# Patient Record
Sex: Female | Born: 1935 | Race: White | Hispanic: No | Smoking: Never smoker
Health system: Southern US, Community
[De-identification: ages and names within clinical notes are randomized; demographics above are authoritative.]

## PROBLEM LIST (undated history)

## (undated) DIAGNOSIS — E7800 Pure hypercholesterolemia, unspecified: Secondary | ICD-10-CM

## (undated) DIAGNOSIS — I10 Essential (primary) hypertension: Secondary | ICD-10-CM

## (undated) DIAGNOSIS — R000 Tachycardia, unspecified: Secondary | ICD-10-CM

## (undated) DIAGNOSIS — I639 Cerebral infarction, unspecified: Secondary | ICD-10-CM

## (undated) HISTORY — PX: ABDOMINAL HYSTERECTOMY: SHX81

---

## 1998-07-19 ENCOUNTER — Ambulatory Visit (HOSPITAL_COMMUNITY): Admission: RE | Admit: 1998-07-19 | Discharge: 1998-07-19 | Payer: Self-pay | Admitting: Family Medicine

## 1999-01-24 ENCOUNTER — Emergency Department (HOSPITAL_COMMUNITY): Admission: EM | Admit: 1999-01-24 | Discharge: 1999-01-24 | Payer: Self-pay | Admitting: Emergency Medicine

## 2000-04-30 ENCOUNTER — Other Ambulatory Visit: Admission: RE | Admit: 2000-04-30 | Discharge: 2000-04-30 | Payer: Self-pay | Admitting: Family Medicine

## 2000-07-23 ENCOUNTER — Encounter: Payer: Self-pay | Admitting: Family Medicine

## 2000-07-23 ENCOUNTER — Encounter: Admission: RE | Admit: 2000-07-23 | Discharge: 2000-07-23 | Payer: Self-pay | Admitting: Family Medicine

## 2000-08-05 ENCOUNTER — Encounter: Payer: Self-pay | Admitting: Family Medicine

## 2000-08-05 ENCOUNTER — Encounter: Admission: RE | Admit: 2000-08-05 | Discharge: 2000-08-05 | Payer: Self-pay | Admitting: Family Medicine

## 2001-07-27 ENCOUNTER — Encounter: Admission: RE | Admit: 2001-07-27 | Discharge: 2001-07-27 | Payer: Self-pay | Admitting: Family Medicine

## 2001-07-27 ENCOUNTER — Encounter: Payer: Self-pay | Admitting: Family Medicine

## 2002-07-26 ENCOUNTER — Encounter: Admission: RE | Admit: 2002-07-26 | Discharge: 2002-07-26 | Payer: Self-pay | Admitting: Family Medicine

## 2002-07-26 ENCOUNTER — Encounter: Payer: Self-pay | Admitting: Family Medicine

## 2002-08-01 ENCOUNTER — Encounter (INDEPENDENT_AMBULATORY_CARE_PROVIDER_SITE_OTHER): Payer: Self-pay | Admitting: Cardiology

## 2002-08-01 ENCOUNTER — Encounter: Payer: Self-pay | Admitting: Emergency Medicine

## 2002-08-01 ENCOUNTER — Encounter: Payer: Self-pay | Admitting: Internal Medicine

## 2002-08-01 ENCOUNTER — Inpatient Hospital Stay (HOSPITAL_COMMUNITY): Admission: EM | Admit: 2002-08-01 | Discharge: 2002-08-04 | Payer: Self-pay | Admitting: Emergency Medicine

## 2003-01-23 ENCOUNTER — Ambulatory Visit (HOSPITAL_COMMUNITY): Admission: RE | Admit: 2003-01-23 | Discharge: 2003-01-23 | Payer: Self-pay | Admitting: Gastroenterology

## 2003-01-23 ENCOUNTER — Encounter (INDEPENDENT_AMBULATORY_CARE_PROVIDER_SITE_OTHER): Payer: Self-pay | Admitting: Specialist

## 2003-08-10 ENCOUNTER — Encounter: Payer: Self-pay | Admitting: Family Medicine

## 2003-08-10 ENCOUNTER — Encounter: Admission: RE | Admit: 2003-08-10 | Discharge: 2003-08-10 | Payer: Self-pay | Admitting: Family Medicine

## 2004-09-02 ENCOUNTER — Encounter: Admission: RE | Admit: 2004-09-02 | Discharge: 2004-09-02 | Payer: Self-pay | Admitting: Family Medicine

## 2004-09-09 ENCOUNTER — Encounter: Admission: RE | Admit: 2004-09-09 | Discharge: 2004-09-09 | Payer: Self-pay | Admitting: Family Medicine

## 2005-04-30 ENCOUNTER — Encounter: Admission: RE | Admit: 2005-04-30 | Discharge: 2005-04-30 | Payer: Self-pay | Admitting: Family Medicine

## 2005-09-19 ENCOUNTER — Encounter: Admission: RE | Admit: 2005-09-19 | Discharge: 2005-09-19 | Payer: Self-pay | Admitting: Family Medicine

## 2006-09-21 ENCOUNTER — Encounter: Admission: RE | Admit: 2006-09-21 | Discharge: 2006-09-21 | Payer: Self-pay | Admitting: Family Medicine

## 2007-09-23 ENCOUNTER — Encounter: Admission: RE | Admit: 2007-09-23 | Discharge: 2007-09-23 | Payer: Self-pay | Admitting: Family Medicine

## 2008-09-25 ENCOUNTER — Encounter: Admission: RE | Admit: 2008-09-25 | Discharge: 2008-09-25 | Payer: Self-pay | Admitting: Family Medicine

## 2009-09-27 ENCOUNTER — Encounter: Admission: RE | Admit: 2009-09-27 | Discharge: 2009-09-27 | Payer: Self-pay | Admitting: Family Medicine

## 2010-09-30 ENCOUNTER — Encounter: Admission: RE | Admit: 2010-09-30 | Discharge: 2010-09-30 | Payer: Self-pay | Admitting: Family Medicine

## 2010-12-14 ENCOUNTER — Encounter: Payer: Self-pay | Admitting: Family Medicine

## 2011-04-11 NOTE — Consult Note (Signed)
NAME:  Anna Weiss, Anna Weiss                           ACCOUNT NO.:  [id number]   MEDICAL RECORD NO.:  [id number]                   PATIENT TYPE:  INP   LOCATION:  5714                                 FACILITY:  [facility]   PHYSICIAN:  Melvyn Novas, M.D.               DATE OF BIRTH:  09/07/1925   DATE OF CONSULTATION:  08/02/2002  DATE OF DISCHARGE:                                   CONSULTATION   HISTORY OF PRESENT ILLNESS:  The patient is a consult called by Dr. Nehemiah Settle  of the [facility] regarding this inpatient, Anna Weiss, on the  5700 ward.  The patient was admitted yesterday for a speech difficulty which  she noticed when she left her bed in the morning at approximately 7:30.  She  said she felt fine, had no physical events, only that she noticed that her  speech was impaired.  She could not quite get the words out and somehow her  speech sounded slurred.  She has never had this kind of event before and  after consulting with the daughter by phone was urged to come to the  hospital ER where she promptly appeared.  She also noticed that there was  already a partial resolution of her speech problem at the time when she was  evaluated in the ER.   PAST MEDICAL HISTORY:  1. Hypertension and vertigo spells, a single episode, but no persistent     hypertension.  2. Paroxysmal atrial tachycardia.  3. Hysterectomy in 1967 for endometriosis.  4. Hernia repair in the 80's.   FAMILY HISTORY:  Diabetes mellitus in a brother, mother, aunt and  grandmother - all maternal family.  Coronary artery disease in father who  died of a MI in his sixties.   MEDICATIONS:  1. Synthroid.  2. Isoptin eye drops.   SOCIAL HISTORY:  The patient is married and has one daughter, one son and  one grandchild with each.  She lives with her spouse.  She is a non-smoker  and a non-drinker.   ALLERGIES:  No known drug allergies.   GENERAL PHYSICAL EXAMINATION:  VITAL SIGNS:  She is afebrile.  MENTAL  STATUS:  Spontaneous generalization of speech is mildly impaired.  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  The patient speaks slower than normal since she is  afraid that she is not speaking clear enough.  She does, however, hesitate  in her speech but she can repeat fluent and name all her medications.  She  has a normal reading skill.  No dyspraxia and no sensory aphasia.  HEENT:  Cranial nerves:  Pupils equal and round to accommodation.  Extraocular movements are intact.  Equal visual fields to bilateral and  single stimulation to moving objects.  Equal motor strength in face. Equal  sensory. Tongue and uvula are midline.  Neck  is supple.  Face appears  asymmetric, but the patient's family noticed no difference.  MOTOR:  The patient has 5/5 bilateral equal motor strength __________ .  No  pronator drift.  Good fine motor movements.  Tone and reflexes are equal.  The patient has downgoing toes bilaterally to counter stimulation.  The  general reflex level is about 1+.  Sensory is intact to pinprick, fine  touch, temperature and vibration and cortical stimulation.  Coordination:  Finger/nose shows mild dysmetria, a little bit more prominent on the left  than on the right but present on both sides.  There is no tremor and no  ataxia.  Gait was deferred.   ADDITIONAL INFORMATION:  By the patient's chart she was started on  propranolol and Isoptin according to Dr. Idelle Crouch admission note.  Propranolol was just given 10 mg p.o. b.i.d. and I am not sure if she had  been hypertensive except for the one single event prior to this admission  when she had vertigo associated with it.  Here apparently she presented  initially with an elevated systolic blood pressure in the 160's.  The  patient has been on aspirin at home so that [facility] considered to add Plavix  at this point.  A speech therapy evaluation was called.  She does not need  PT or  OT according to our evaluation except should she have a gait  impairment which we cannot at this point predict.  There is a handwritten  note regarding an MRI that I have not been able to see in person.  This  shows left posterior and frontal cortical infarct and pre-sentinel  involvement.  These are multiple old cortical infarcts, but acute left  posterior and frontal cortical infarct making an embolic source much more  likely. Distal MCA and PCA branches  seem to be involved.  The patient has  no history of angiopathy and it should be evaluated by a cardiac echo  evaluation, perhaps consider TEE if this is a smaller akinetic vault  location in origin or a small vascular defect.  It would also be beneficial  to obtain a sed rate at this point. 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  We will  await the workup for possible cardiac source and until then the patient  should be safe on aspirin and Plavix but I would also consider Aggrenox as a  preventive medication.  Aggrenox in itself as a vascular dilator might help  also in the hypertensive therapy.   I thank you sincerely for this consultation and we will follow tomorrow.                                               Melvyn Novas, M.D.    CD/MEDQ  D:  08/02/2002  T:  08/03/2002  Job:  [id number]   cc:   Deirdre Peer. Polite, MD  [address]  [place], [place] [address]  Fax: [phone]

## 2011-04-11 NOTE — Op Note (Signed)
   NAME:  Anna Weiss, RAGEN                           ACCOUNT NO.:  [id number]   MEDICAL RECORD NO.:  [id number]                   PATIENT TYPE:  AMB   LOCATION:  ENDO                                 FACILITY:  [facility]   PHYSICIAN:  Petra Kuba, M.D.                 DATE OF BIRTH:  1925-10-03   DATE OF PROCEDURE:  01/23/2003  DATE OF DISCHARGE:                                 OPERATIVE REPORT   PROCEDURE:  Colonoscopy with biopsy.   INDICATIONS:  Screening.  Consent was signed after risks, benefits, methods,  and options thoroughly discussed in the office.   MEDICATIONS:  Versed 5 mg only.   DESCRIPTION OF PROCEDURE:  Rectal inspection was pertinent for external  hemorrhoids, small.  Digital exam was negative.  The video pediatric  adjustable colonoscope was inserted, easily advanced around the colon to the  cecum.  This did not require any abdominal pressure and position changes.  No obvious abnormality was seen on insertion.  The cecum was identified by  the appendiceal orifice and the ileocecal valve.  The scope was slowly  withdrawn.  The prep was fairly adequate.  There was some liquid stool that  required washing and suctioning.  On slow withdrawal through the colon the  cecum, ascending, and transverse were normal.  The scope was withdrawn  around the left side of the colon and two questionable tiny polyps, one in  the mid-descending and one in the sigmoid, were seen and were each cold  biopsied and put in the same container.  No other abnormalities were seen as  we slowly withdrew back to the rectum.  Once back in the rectum the scope  was retroflexed, pertinent for some internal hemorrhoids.  The scope was  straightened and readvanced a short way up the left side of the colon, air  was suctioned, and the scope removed.  The patient tolerated the procedure  well.  There was no obvious immediate complication.   ENDOSCOPIC DIAGNOSES:  1. Internal-external small hemorrhoids.  2.  Two tiny questionable polyps in the sigmoid and descending, status post     cold biopsy.  3. Otherwise within normal limits to the cecum.   PLAN:  Yearly rectals and guaiacs per Dr. Arvilla Market.  Happy to see back  p.r.n.  Await pathology to determine future colonic screening if doing well  medically.                                               Petra Kuba, M.D.    MEM/MEDQ  D:  01/23/2003  T:  01/23/2003  Job:  [id number]   cc:   Donia Guiles, M.D.  [address] [place]  [place] [address]  Fax: [phone]

## 2011-04-11 NOTE — Discharge Summary (Signed)
NAMEGENEVIE, Anna Weiss                           ACCOUNT NO.:  [id number]   MEDICAL RECORD NO.:  [id number]                   PATIENT TYPE:  INP   LOCATION:  5714                                 FACILITY:  [facility]   PHYSICIAN:  Deirdre Peer. Polite, MD                DATE OF BIRTH:  04-27-1925   DATE OF ADMISSION:  08/01/2002  DATE OF DISCHARGE:  08/04/2002                                 DISCHARGE SUMMARY   DISCHARGE DIAGNOSES:  1. 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  Carotid dopplers were negative.  A 2-D echocardiogram negative.  2. Dyslipidemia.  3. Hypothyroidism.  4. History of paroxysm atrial tachycardia.   DISCHARGE MEDICATIONS:  1. Aspirin 325 mg q.d.  2. Synthroid 50 mcg q.d.  3. Inderal 50 mg q.12h.  4. Calan SR 120 mg q.d.  5. Plavix 75 mg q.d.  6. Lipitor 10 mg q.d.   CONSULTATIONS:  Melvyn Novas, M.D., neurology.   PROCEDURE:  1. CT of the head without contrast August 01, 2002;  mild atrophy with     mild to moderate small vessel disease-type changes, right greater than     left; no evidence of intracranial hemorrhage.  2. MRI of the brain on August 01, 2002; acute left posterior frontal     cortical infarct effecting the precentral cortex and atrophy of small     vessel disease with multiple cortical infarct.  3. MRA of the head August 01, 2002; mild irregularity of the distal MCA     and PCA branches.  Otherwise negative.  Carotid ultrasound August 01, 2002 revealed mild plaque throughout bilaterally.  No RCA stenosis     bilaterally.  Antegrade vertebral artery flow bilaterally.  4. A 2-D echocardiogram August 01, 2002 revealed a normal left ventricle.     Normal aortic valve.  Mild thickening of the mitral valve involving the     anterior leaflet.  Normal left atrial size.  EKG August 01, 2002 with     sinus bradycardia with sinus arrhythmia and a first degree AV block.     Ventricular rate 57.   Repeat EKG August 02, 2002 sinus bradycardia with     a first degree AV block.  Ventricular rate 55.   LABORATORY DATA:  Admission CBC with a WBC count of 10, RBC count 4.3,  hemoglobin 13.9, hematocrit 40.8, MCV 93.3, platelet count 301,000.  Chemistries with a sodium of 138, potassium 4, chloride 103, CO2 26, glucose  97, BUN 10, creatinine 0.7, albumin 3.1.  Fasting glucose was 89, magnesium  1.8, phosphorus 2.8, AST 20, ALT 15, ALP 56.  Homocystine was 8.99.  TSR was  19.  C-reactive protein was 1.6.  Lipid profile with a total cholesterol of  205, triglycerides 89,  HDL 69, LDL 118.  PT 12.9, INR 0.9, PTT 24.  TSH  4.054.   DISPOSITION:  The patient will be discharged home.   HISTORY OF PRESENT ILLNESS:  This is a 77 year old female who presented to  [facility] Emergency Room on August 01, 2002 with new  onset of garbled speech.  The patient stated she had been feeling in her  usual state of health until the previous night.  Initially, her blood  pressure was elevated at 186/88.  She was noted to have significant  dysarthria.  A CT of the head initially revealed no bleed and old changes  consistent with hypertensive changes.  Her speech was noted to improve  slightly but she remained quite dysarthric.  She was admitted for further  treatment and further workup.   HOSPITAL COURSE:  1- NEW ONSET OF DYSARTHRIA:  Her initial neurologic exam  was essentially normal except for dysarthria.  There were no focal deficits  in her strength.  Again, initial CT revealed no bleed.  A MRI and MRA were  done on August 01, 2002 and a MRI revealed an acute left posterior frontal  cortical infarct effecting the precentral cortex, atrophy, small vessel  disease, and multiple old infarcts.  MRAs only revealed mild irregularity of  the distal MCA and PCA.   A 2-D echocardiogram was done to rule out a cardiac source of emboli which  was negative.  Carotid dopplers were also  done, which only revealed mild  plaque throughout bilaterally and no stenosis.  The patient remained  neurologically stable with improvement of her speech to baseline.  She was  evaluated but due to no deficits she was not a candidate for physical  therapy or speech therapy.   Plavix was added along with aspirin to her regimen.  She will be discharged  on Plavix 75 mg daily.  At discharge, the patient had no complaints.  She  denied any dysphagia.  No dizziness.   On August 02, 2002, a neurology consult was obtained by Dr. Porfirio Mylar  Dohmeier; at which time, her recommendations were to add Plavix to her  medication regimen unless a cardiac source of emboli was identified.  At  which time, would consider Coumadin therapy.  No cardiac source was  identified.  Plavix was added.   2 - DYSLIPIDEMIA:  A lipid profile was done which revealed a total  cholesterol of 205, HDL of 65, and LDL of 116.  Lipitor was added to her  medication regimen due to her history of vascular event for a goal LDL less  than 100.  Initial liver function tests in the hospital were within normal  limits as stated above.  She will need follow-up LFTs by her primary care  physician.   3 - HISTORY OF PAROXYSM ATRIAL TACHYCARDIA:  The patient was continued on  her home regimen of Inderal and Calan.  She remained in a sinus rhythm to  sinus brady.  A 24-hour Holter monitor was done and is pending at discharge.   4 - HYPOTHYROIDISM:  Synthroid was continued at 50 mcg daily.  TSH was  normal at 4.054.   FOLLOW UP:  The patient will follow up with her primary care physician, Dr.  Desma Maxim, on August 11, 2002 at 9:45.        Stephanie Swaziland, NP                      Deirdre Peer. Polite, MD    SJ/MEDQ  D:  08/04/2002  T:  08/06/2002  Job:  [id number]   cc:   Desma Maxim, M.D.

## 2011-08-22 ENCOUNTER — Other Ambulatory Visit: Payer: Self-pay | Admitting: Family Medicine

## 2011-08-22 DIAGNOSIS — Z1231 Encounter for screening mammogram for malignant neoplasm of breast: Secondary | ICD-10-CM

## 2011-09-11 ENCOUNTER — Ambulatory Visit (INDEPENDENT_AMBULATORY_CARE_PROVIDER_SITE_OTHER): Payer: Medicare Other | Admitting: *Deleted

## 2011-09-11 DIAGNOSIS — M79609 Pain in unspecified limb: Secondary | ICD-10-CM

## 2011-09-11 DIAGNOSIS — R609 Edema, unspecified: Secondary | ICD-10-CM

## 2011-10-02 ENCOUNTER — Ambulatory Visit
Admission: RE | Admit: 2011-10-02 | Discharge: 2011-10-02 | Disposition: A | Discharge status: Home / Self Care | Payer: Medicare Other | Source: Ambulatory Visit | Attending: Family Medicine | Admitting: Family Medicine

## 2011-10-02 DIAGNOSIS — Z1231 Encounter for screening mammogram for malignant neoplasm of breast: Secondary | ICD-10-CM

## 2011-10-06 NOTE — Procedures (Unsigned)
DUPLEX DEEP VENOUS EXAM - LOWER EXTREMITY  INDICATION:  Right lower extremity edema.  HISTORY:  Edema:  Yes. Trauma/Surgery:  No. Pain:  Yes. PE:  No. Previous DVT:  No. Anticoagulants:  No. Other:  DUPLEX EXAM:               CFV   SFV   PopV  PTV    GSV               R  L  R  L  R  L  R   L  R  L Thrombosis    o  o  o     o     o      o Spontaneous   +  +  +     +     +      + Phasic        +  +  +     +     +      + Augmentation  +  +  +     +     +      + Compressible  +  +  +     +     +      + Competent     +  +  +     +     +      +  Legend:  + - yes  o - no  p - partial  D - decreased  IMPRESSION: 1. No evidence of deep venous thrombosis was noted. 2. Small Baker's cyst measuring 1.26 x 1.56 cm was noted in the right     popliteal fossa. 3. Results were called to Dr. Darrelyn Hillock.   _____________________________ V. Charlena Cross, MD  EM/MEDQ  D:  09/15/2011  T:  09/15/2011  Job:  [id number]

## 2012-03-18 DIAGNOSIS — D485 Neoplasm of uncertain behavior of skin: Secondary | ICD-10-CM | POA: Diagnosis not present

## 2012-03-18 DIAGNOSIS — D235 Other benign neoplasm of skin of trunk: Secondary | ICD-10-CM | POA: Diagnosis not present

## 2012-03-18 DIAGNOSIS — L570 Actinic keratosis: Secondary | ICD-10-CM | POA: Diagnosis not present

## 2012-03-23 DIAGNOSIS — E782 Mixed hyperlipidemia: Secondary | ICD-10-CM | POA: Diagnosis not present

## 2012-03-23 DIAGNOSIS — I10 Essential (primary) hypertension: Secondary | ICD-10-CM | POA: Diagnosis not present

## 2012-03-23 DIAGNOSIS — E039 Hypothyroidism, unspecified: Secondary | ICD-10-CM | POA: Diagnosis not present

## 2012-06-17 DIAGNOSIS — D1801 Hemangioma of skin and subcutaneous tissue: Secondary | ICD-10-CM | POA: Diagnosis not present

## 2012-06-17 DIAGNOSIS — L819 Disorder of pigmentation, unspecified: Secondary | ICD-10-CM | POA: Diagnosis not present

## 2012-06-17 DIAGNOSIS — D485 Neoplasm of uncertain behavior of skin: Secondary | ICD-10-CM | POA: Diagnosis not present

## 2012-08-10 DIAGNOSIS — D485 Neoplasm of uncertain behavior of skin: Secondary | ICD-10-CM | POA: Diagnosis not present

## 2012-08-19 DIAGNOSIS — Z23 Encounter for immunization: Secondary | ICD-10-CM | POA: Diagnosis not present

## 2012-08-19 DIAGNOSIS — Z961 Presence of intraocular lens: Secondary | ICD-10-CM | POA: Diagnosis not present

## 2012-08-30 ENCOUNTER — Other Ambulatory Visit: Payer: Self-pay | Admitting: Family Medicine

## 2012-08-30 DIAGNOSIS — Z1231 Encounter for screening mammogram for malignant neoplasm of breast: Secondary | ICD-10-CM

## 2012-10-04 ENCOUNTER — Ambulatory Visit
Admission: RE | Admit: 2012-10-04 | Discharge: 2012-10-04 | Disposition: A | Discharge status: Home / Self Care | Payer: Medicare Other | Source: Ambulatory Visit | Attending: Family Medicine | Admitting: Family Medicine

## 2012-10-04 DIAGNOSIS — Z1231 Encounter for screening mammogram for malignant neoplasm of breast: Secondary | ICD-10-CM

## 2012-12-14 DIAGNOSIS — M76899 Other specified enthesopathies of unspecified lower limb, excluding foot: Secondary | ICD-10-CM | POA: Diagnosis not present

## 2013-01-20 DIAGNOSIS — E782 Mixed hyperlipidemia: Secondary | ICD-10-CM | POA: Diagnosis not present

## 2013-01-20 DIAGNOSIS — I679 Cerebrovascular disease, unspecified: Secondary | ICD-10-CM | POA: Diagnosis not present

## 2013-03-22 DIAGNOSIS — M949 Disorder of cartilage, unspecified: Secondary | ICD-10-CM | POA: Diagnosis not present

## 2013-03-22 DIAGNOSIS — M899 Disorder of bone, unspecified: Secondary | ICD-10-CM | POA: Diagnosis not present

## 2013-04-20 ENCOUNTER — Other Ambulatory Visit: Payer: Self-pay | Admitting: Family Medicine

## 2013-04-20 ENCOUNTER — Ambulatory Visit
Admission: RE | Admit: 2013-04-20 | Discharge: 2013-04-20 | Disposition: A | Discharge status: Home / Self Care | Payer: Medicare Other | Source: Ambulatory Visit | Attending: Family Medicine | Admitting: Family Medicine

## 2013-04-20 DIAGNOSIS — M7918 Myalgia, other site: Secondary | ICD-10-CM

## 2013-04-20 DIAGNOSIS — M169 Osteoarthritis of hip, unspecified: Secondary | ICD-10-CM | POA: Diagnosis not present

## 2013-04-20 DIAGNOSIS — M545 Low back pain: Secondary | ICD-10-CM | POA: Diagnosis not present

## 2013-04-20 DIAGNOSIS — M47817 Spondylosis without myelopathy or radiculopathy, lumbosacral region: Secondary | ICD-10-CM | POA: Diagnosis not present

## 2013-07-22 DIAGNOSIS — E782 Mixed hyperlipidemia: Secondary | ICD-10-CM | POA: Diagnosis not present

## 2013-07-22 DIAGNOSIS — I679 Cerebrovascular disease, unspecified: Secondary | ICD-10-CM | POA: Diagnosis not present

## 2013-07-22 DIAGNOSIS — I10 Essential (primary) hypertension: Secondary | ICD-10-CM | POA: Diagnosis not present

## 2013-07-22 DIAGNOSIS — E039 Hypothyroidism, unspecified: Secondary | ICD-10-CM | POA: Diagnosis not present

## 2013-07-22 DIAGNOSIS — R7301 Impaired fasting glucose: Secondary | ICD-10-CM | POA: Diagnosis not present

## 2013-07-27 DIAGNOSIS — R7301 Impaired fasting glucose: Secondary | ICD-10-CM | POA: Diagnosis not present

## 2013-08-22 DIAGNOSIS — Z961 Presence of intraocular lens: Secondary | ICD-10-CM | POA: Diagnosis not present

## 2013-08-22 DIAGNOSIS — E119 Type 2 diabetes mellitus without complications: Secondary | ICD-10-CM | POA: Diagnosis not present

## 2013-08-29 ENCOUNTER — Other Ambulatory Visit: Payer: Self-pay

## 2013-08-29 DIAGNOSIS — Z1231 Encounter for screening mammogram for malignant neoplasm of breast: Secondary | ICD-10-CM

## 2013-09-07 ENCOUNTER — Other Ambulatory Visit: Payer: Self-pay | Admitting: Dermatology

## 2013-09-07 DIAGNOSIS — D485 Neoplasm of uncertain behavior of skin: Secondary | ICD-10-CM | POA: Diagnosis not present

## 2013-09-07 DIAGNOSIS — L821 Other seborrheic keratosis: Secondary | ICD-10-CM | POA: Diagnosis not present

## 2013-09-14 DIAGNOSIS — Z23 Encounter for immunization: Secondary | ICD-10-CM | POA: Diagnosis not present

## 2013-10-05 ENCOUNTER — Ambulatory Visit
Admission: RE | Admit: 2013-10-05 | Discharge: 2013-10-05 | Disposition: A | Discharge status: Home / Self Care | Payer: Medicare Other | Source: Ambulatory Visit

## 2013-10-05 DIAGNOSIS — Z1231 Encounter for screening mammogram for malignant neoplasm of breast: Secondary | ICD-10-CM

## 2014-01-02 ENCOUNTER — Emergency Department (HOSPITAL_COMMUNITY)
Admission: EM | Admit: 2014-01-02 | Discharge: 2014-01-02 | Disposition: A | Discharge status: Home / Self Care | Payer: Medicare Other | Attending: Emergency Medicine | Admitting: Emergency Medicine

## 2014-01-02 ENCOUNTER — Encounter (HOSPITAL_COMMUNITY): Payer: Self-pay | Admitting: Emergency Medicine

## 2014-01-02 DIAGNOSIS — R112 Nausea with vomiting, unspecified: Secondary | ICD-10-CM | POA: Insufficient documentation

## 2014-01-02 DIAGNOSIS — Z8673 Personal history of transient ischemic attack (TIA), and cerebral infarction without residual deficits: Secondary | ICD-10-CM | POA: Insufficient documentation

## 2014-01-02 DIAGNOSIS — R197 Diarrhea, unspecified: Secondary | ICD-10-CM | POA: Diagnosis not present

## 2014-01-02 DIAGNOSIS — Z79899 Other long term (current) drug therapy: Secondary | ICD-10-CM | POA: Diagnosis not present

## 2014-01-02 DIAGNOSIS — R630 Anorexia: Secondary | ICD-10-CM | POA: Diagnosis not present

## 2014-01-02 DIAGNOSIS — Z7902 Long term (current) use of antithrombotics/antiplatelets: Secondary | ICD-10-CM | POA: Insufficient documentation

## 2014-01-02 DIAGNOSIS — I10 Essential (primary) hypertension: Secondary | ICD-10-CM | POA: Diagnosis not present

## 2014-01-02 DIAGNOSIS — R52 Pain, unspecified: Secondary | ICD-10-CM | POA: Diagnosis not present

## 2014-01-02 DIAGNOSIS — E860 Dehydration: Secondary | ICD-10-CM | POA: Diagnosis not present

## 2014-01-02 DIAGNOSIS — E7800 Pure hypercholesterolemia, unspecified: Secondary | ICD-10-CM | POA: Diagnosis not present

## 2014-01-02 HISTORY — DX: Tachycardia, unspecified: R00.0

## 2014-01-02 HISTORY — DX: Cerebral infarction, unspecified: I63.9

## 2014-01-02 HISTORY — DX: Essential (primary) hypertension: I10

## 2014-01-02 HISTORY — DX: Pure hypercholesterolemia, unspecified: E78.00

## 2014-01-02 LAB — COMPREHENSIVE METABOLIC PANEL
ALT: 17 U/L (ref 0–35)
AST: 29 U/L (ref 0–37)
Albumin: 3.5 g/dL (ref 3.5–5.2)
Alkaline Phosphatase: 60 U/L (ref 39–117)
BILIRUBIN TOTAL: 0.5 mg/dL (ref 0.3–1.2)
BUN: 15 mg/dL (ref 6–23)
CHLORIDE: 99 meq/L (ref 96–112)
CO2: 24 mEq/L (ref 19–32)
CREATININE: 0.6 mg/dL (ref 0.50–1.10)
Calcium: 8.5 mg/dL (ref 8.4–10.5)
GFR calc Af Amer: >90 mL/min (ref >90)
GFR calc non Af Amer: 80 mL/min — ABNORMAL LOW (ref >90)
Glucose, Bld: 115 mg/dL — ABNORMAL HIGH (ref 70–99)
Potassium: 4.3 mEq/L (ref 3.7–5.3)
Sodium: 136 mEq/L — ABNORMAL LOW (ref 137–147)
TOTAL PROTEIN: 6.9 g/dL (ref 6.0–8.3)

## 2014-01-02 LAB — CBC WITH DIFFERENTIAL/PLATELET
Basophils Absolute: 0 10*3/uL (ref 0.0–0.1)
Basophils Relative: 0 % (ref 0–1)
EOS ABS: 0 10*3/uL (ref 0.0–0.7)
Eosinophils Relative: 0 % (ref 0–5)
HCT: 42.1 % (ref 36.0–46.0)
Hemoglobin: 14.4 g/dL (ref 12.0–15.0)
Lymphocytes Relative: 13 % (ref 12–46)
Lymphs Abs: 1.1 10*3/uL (ref 0.7–4.0)
MCH: 31.7 pg (ref 26.0–34.0)
MCHC: 34.2 g/dL (ref 30.0–36.0)
MCV: 92.7 fL (ref 78.0–100.0)
Monocytes Absolute: 1.2 10*3/uL — ABNORMAL HIGH (ref 0.1–1.0)
Monocytes Relative: 15 % — ABNORMAL HIGH (ref 3–12)
Neutro Abs: 5.7 10*3/uL (ref 1.7–7.7)
Neutrophils Relative %: 71 % (ref 43–77)
PLATELETS: 287 10*3/uL (ref 150–400)
RBC: 4.54 MIL/uL (ref 3.87–5.11)
RDW: 14.2 % (ref 11.5–15.5)
WBC: 8.1 10*3/uL (ref 4.0–10.5)

## 2014-01-02 LAB — URINE MICROSCOPIC-ADD ON

## 2014-01-02 LAB — URINALYSIS, ROUTINE W REFLEX MICROSCOPIC
Bilirubin Urine: NEGATIVE
Glucose, UA: NEGATIVE mg/dL
Hgb urine dipstick: NEGATIVE
KETONES UR: 15 mg/dL — AB
NITRITE: NEGATIVE
PROTEIN: NEGATIVE mg/dL
Specific Gravity, Urine: 1.016 (ref 1.005–1.030)
Urobilinogen, UA: 1 mg/dL (ref 0.0–1.0)
pH: 7 (ref 5.0–8.0)

## 2014-01-02 LAB — CG4 I-STAT (LACTIC ACID): Lactic Acid, Venous: 1.52 mmol/L (ref 0.5–2.2)

## 2014-01-02 MED ORDER — SODIUM CHLORIDE 0.9 % IV BOLUS (SEPSIS)
1000.0000 mL | Freq: Once | INTRAVENOUS | Status: AC
  Administered 2014-01-02: 1000 mL via INTRAVENOUS

## 2014-01-02 MED ORDER — ONDANSETRON 4 MG PO TBDP: 4.0000 mg | ORAL_TABLET | Freq: Three times a day (TID) | ORAL | Status: DC | PRN

## 2014-01-02 MED ORDER — ONDANSETRON HCL 4 MG/2ML IJ SOLN
4.0000 mg | Freq: Once | INTRAMUSCULAR | Status: AC
  Administered 2014-01-02: 4 mg via INTRAVENOUS
  Filled 2014-01-02: qty 2

## 2014-01-02 NOTE — ED Notes (Signed)
Pt felt dizzy while sitting up, stating she thinks it's because she hasn't eaten all day. Pt given some crackers and soda, MD aware.

## 2014-01-02 NOTE — ED Notes (Signed)
Pt states last Thursday started having diarrhea, then started having nausea and vomiting after the diarrhea started, pt states has not been able to keep down any food or fluids, pt states still feeling nauseous after EMS gave her the Zofran, denies pain.

## 2014-01-02 NOTE — ED Notes (Signed)
Bed: WA05 Expected date:  Expected time:  Means of arrival:  Comments: EMS-N/V/D 

## 2014-01-02 NOTE — ED Provider Notes (Signed)
CSN: [id number]     Arrival date & time 01/02/14  5027 History   First MD Initiated Contact with Patient 01/02/14 1009     Chief Complaint  Patient presents with  . Nausea  . Emesis  . Diarrhea      HPI  Patient presents with concerns of ongoing nausea, vomiting, generalized discomfort. Symptoms began 4 days ago. She notes that she was in her usual state of health prior to the onset of symptoms. No concurrently ill family members. Patient denies significant ongoing pain, though there is mild stomach discomfort. Since onset patient has had persistent nausea, anorexia secondary to nausea is worse following by mouth intake. Multiple episodes of emesis. No sustained diarrhea. Last bowel movement was this morning. No concurrent chest pain, dyspnea, lightheadedness, syncope, fever, chills. Patient has history of abdominal surgery in the distant past.   Past Medical History  Diagnosis Date  . Hypertension   . High cholesterol   . Tachycardia   . Stroke     2003   Past Surgical History  Procedure Laterality Date  . Abdominal hysterectomy     No family history on file. History  Substance Use Topics  . Smoking status: Never Smoker   . Smokeless tobacco: Never Used  . Alcohol Use: No   OB History   Grav Para Term Preterm Abortions TAB SAB Ect Mult Living                 Review of Systems  Constitutional:       Per HPI, otherwise negative  HENT:       Per HPI, otherwise negative  Respiratory:       Per HPI, otherwise negative  Cardiovascular:       Per HPI, otherwise negative  Gastrointestinal: Positive for nausea and vomiting.  Endocrine:       Negative aside from HPI  Genitourinary:       Neg aside from HPI   Musculoskeletal:       Per HPI, otherwise negative  Skin: Negative.   Neurological: Negative for syncope.      Allergies  Aspirin  Home Medications   Current Outpatient Rx  Name  Route  Sig  Dispense  Refill  . Calcium Carb-Cholecalciferol  (CALCIUM 600 + D PO)   Oral   Take 2 tablets by mouth daily.         . clopidogrel (PLAVIX) 75 MG tablet   Oral   Take 75 mg by mouth daily with breakfast.         . ferrous fumarate (HEMOCYTE - 106 MG FE) 325 (106 FE) MG TABS tablet   Oral   Take 1 tablet by mouth daily.         Marland Kitchen levothyroxine (SYNTHROID, LEVOTHROID) 50 MCG tablet   Oral   Take 50 mcg by mouth daily before breakfast.         . methylcellulose (ARTIFICIAL TEARS) 1 % ophthalmic solution   Both Eyes   Place 1 drop into both eyes 2 (two) times daily.         . Multiple Vitamins-Minerals (MULTIVITAMIN WITH MINERALS) tablet   Oral   Take 1 tablet by mouth daily.         . propranolol (INDERAL) 10 MG tablet   Oral   Take 10 mg by mouth 2 (two) times daily.         . simvastatin (ZOCOR) 10 MG tablet   Oral   Take 10 mg  by mouth every evening.         . verapamil (VERELAN PM) 120 MG 24 hr capsule   Oral   Take 120 mg by mouth every morning.          BP 171/42  Pulse 60  Temp(Src) 98.4 F (36.9 C) (Oral)  Resp 16  SpO2 98% Physical Exam  Nursing note and vitals reviewed. Constitutional: She is oriented to person, place, and time. She appears well-developed and well-nourished. No distress.  HENT:  Head: Normocephalic and atraumatic.  Eyes: Conjunctivae and EOM are normal.  Cardiovascular: Normal rate and regular rhythm.   Pulmonary/Chest: Effort normal and breath sounds normal. No stridor. No respiratory distress.  Abdominal: She exhibits no distension.  Minimal ttp w no guarding, no rebound, no mass  Musculoskeletal: She exhibits no edema.  Neurological: She is alert and oriented to person, place, and time. No cranial nerve deficit. She exhibits normal muscle tone. Coordination normal.  Skin: Skin is warm and dry.  Psychiatric: She has a normal mood and affect.    ED Course  Procedures (including critical care time) Labs Review Labs Reviewed  CBC WITH DIFFERENTIAL - Abnormal;  Notable for the following:    Monocytes Relative 15 (*)    Monocytes Absolute 1.2 (*)    All other components within normal limits  URINALYSIS, ROUTINE W REFLEX MICROSCOPIC - Abnormal; Notable for the following:    APPearance CLOUDY (*)    Ketones, ur 15 (*)    Leukocytes, UA TRACE (*)    All other components within normal limits  COMPREHENSIVE METABOLIC PANEL - Abnormal; Notable for the following:    Sodium 136 (*)    Glucose, Bld 115 (*)    GFR calc non Af Amer 80 (*)    All other components within normal limits  URINE MICROSCOPIC-ADD ON - Abnormal; Notable for the following:    Bacteria, UA FEW (*)    All other components within normal limits  CG4 I-STAT (LACTIC ACID)   Imaging Review No results found.  EKG Interpretation   None      After the initial eval I reviewed the EMR patient has no recent evaluations here, though she does have previous the documented bradycardia, 50s.   Update: Patient is tolerating oral fluids MDM   This elderly female presents with one week of generalized discomfort, nausea, vomiting, diarrhea.  On exam she is awake, alert, afebrile, hemodynamically stable. After.  Resuscitation with fluids, antiemetics, patient had improvement, was sitting upright.  With this improvement, the absence of notable findings on labs, the patient's tolerance of oral fluids, she was appropriate for discharge with outpatient followup.    Carmin Muskrat, MD 01/03/14 [phone]

## 2014-01-02 NOTE — ED Notes (Signed)
Per EMS pt having nausea/vomiting and diarrhea since last Thursday. 20G Lhand, 4 zofran given 0924, BP 165/84, HR 52, denies pain, states chest hurts when coughing, vomited 2-3 times w/ EMS.

## 2014-01-02 NOTE — Discharge Instructions (Signed)
As discussed, it is important to follow up with her primary care physician tomorrow via telephone to arrange appropriate ongoing care.  Please do not hesitate to return here if you develop new, or concerning changes in your condition.

## 2014-01-02 NOTE — ED Notes (Signed)
PO fluid challenge initiated per verbal order from Dr Vanita Panda

## 2014-01-02 NOTE — ED Notes (Signed)
Pt tolerating PO fluids

## 2014-04-27 DIAGNOSIS — I679 Cerebrovascular disease, unspecified: Secondary | ICD-10-CM | POA: Diagnosis not present

## 2014-04-27 DIAGNOSIS — E782 Mixed hyperlipidemia: Secondary | ICD-10-CM | POA: Diagnosis not present

## 2014-04-27 DIAGNOSIS — M949 Disorder of cartilage, unspecified: Secondary | ICD-10-CM | POA: Diagnosis not present

## 2014-04-27 DIAGNOSIS — I10 Essential (primary) hypertension: Secondary | ICD-10-CM | POA: Diagnosis not present

## 2014-04-27 DIAGNOSIS — M899 Disorder of bone, unspecified: Secondary | ICD-10-CM | POA: Diagnosis not present

## 2014-04-27 DIAGNOSIS — R7301 Impaired fasting glucose: Secondary | ICD-10-CM | POA: Diagnosis not present

## 2014-04-27 DIAGNOSIS — E039 Hypothyroidism, unspecified: Secondary | ICD-10-CM | POA: Diagnosis not present

## 2014-04-27 DIAGNOSIS — I471 Supraventricular tachycardia: Secondary | ICD-10-CM | POA: Diagnosis not present

## 2014-04-27 DIAGNOSIS — Z0000 Encounter for general adult medical examination without abnormal findings: Secondary | ICD-10-CM | POA: Diagnosis not present

## 2014-08-24 DIAGNOSIS — E119 Type 2 diabetes mellitus without complications: Secondary | ICD-10-CM | POA: Diagnosis not present

## 2014-08-24 DIAGNOSIS — Z961 Presence of intraocular lens: Secondary | ICD-10-CM | POA: Diagnosis not present

## 2014-08-31 DIAGNOSIS — Z23 Encounter for immunization: Secondary | ICD-10-CM | POA: Diagnosis not present

## 2014-09-06 ENCOUNTER — Other Ambulatory Visit: Payer: Self-pay

## 2014-09-06 DIAGNOSIS — Z1231 Encounter for screening mammogram for malignant neoplasm of breast: Secondary | ICD-10-CM

## 2014-10-06 ENCOUNTER — Ambulatory Visit
Admission: RE | Admit: 2014-10-06 | Discharge: 2014-10-06 | Disposition: A | Discharge status: Home / Self Care | Payer: Medicare Other | Source: Ambulatory Visit

## 2014-10-06 DIAGNOSIS — Z1231 Encounter for screening mammogram for malignant neoplasm of breast: Secondary | ICD-10-CM | POA: Diagnosis not present

## 2014-10-31 DIAGNOSIS — I10 Essential (primary) hypertension: Secondary | ICD-10-CM | POA: Diagnosis not present

## 2014-10-31 DIAGNOSIS — E782 Mixed hyperlipidemia: Secondary | ICD-10-CM | POA: Diagnosis not present

## 2014-10-31 DIAGNOSIS — I471 Supraventricular tachycardia: Secondary | ICD-10-CM | POA: Diagnosis not present

## 2014-10-31 DIAGNOSIS — Z1389 Encounter for screening for other disorder: Secondary | ICD-10-CM | POA: Diagnosis not present

## 2014-10-31 DIAGNOSIS — M150 Primary generalized (osteo)arthritis: Secondary | ICD-10-CM | POA: Diagnosis not present

## 2014-10-31 DIAGNOSIS — R7301 Impaired fasting glucose: Secondary | ICD-10-CM | POA: Diagnosis not present

## 2014-10-31 DIAGNOSIS — E039 Hypothyroidism, unspecified: Secondary | ICD-10-CM | POA: Diagnosis not present

## 2014-10-31 DIAGNOSIS — I679 Cerebrovascular disease, unspecified: Secondary | ICD-10-CM | POA: Diagnosis not present

## 2015-05-16 DIAGNOSIS — Z6830 Body mass index (BMI) 30.0-30.9, adult: Secondary | ICD-10-CM | POA: Diagnosis not present

## 2015-05-16 DIAGNOSIS — I679 Cerebrovascular disease, unspecified: Secondary | ICD-10-CM | POA: Diagnosis not present

## 2015-05-16 DIAGNOSIS — J309 Allergic rhinitis, unspecified: Secondary | ICD-10-CM | POA: Diagnosis not present

## 2015-05-16 DIAGNOSIS — E039 Hypothyroidism, unspecified: Secondary | ICD-10-CM | POA: Diagnosis not present

## 2015-05-16 DIAGNOSIS — Z0000 Encounter for general adult medical examination without abnormal findings: Secondary | ICD-10-CM | POA: Diagnosis not present

## 2015-05-16 DIAGNOSIS — I471 Supraventricular tachycardia: Secondary | ICD-10-CM | POA: Diagnosis not present

## 2015-05-16 DIAGNOSIS — I10 Essential (primary) hypertension: Secondary | ICD-10-CM | POA: Diagnosis not present

## 2015-05-16 DIAGNOSIS — E782 Mixed hyperlipidemia: Secondary | ICD-10-CM | POA: Diagnosis not present

## 2015-05-16 DIAGNOSIS — M810 Age-related osteoporosis without current pathological fracture: Secondary | ICD-10-CM | POA: Diagnosis not present

## 2015-05-16 DIAGNOSIS — E669 Obesity, unspecified: Secondary | ICD-10-CM | POA: Diagnosis not present

## 2015-05-16 DIAGNOSIS — R7301 Impaired fasting glucose: Secondary | ICD-10-CM | POA: Diagnosis not present

## 2015-05-16 DIAGNOSIS — Z23 Encounter for immunization: Secondary | ICD-10-CM | POA: Diagnosis not present

## 2015-08-16 DIAGNOSIS — Z23 Encounter for immunization: Secondary | ICD-10-CM | POA: Diagnosis not present

## 2015-08-31 ENCOUNTER — Other Ambulatory Visit: Payer: Self-pay

## 2015-08-31 DIAGNOSIS — Z1231 Encounter for screening mammogram for malignant neoplasm of breast: Secondary | ICD-10-CM

## 2015-09-06 DIAGNOSIS — Z961 Presence of intraocular lens: Secondary | ICD-10-CM | POA: Diagnosis not present

## 2015-10-09 ENCOUNTER — Ambulatory Visit
Admission: RE | Admit: 2015-10-09 | Discharge: 2015-10-09 | Disposition: A | Discharge status: Home / Self Care | Payer: Medicare Other | Source: Ambulatory Visit

## 2015-10-09 ENCOUNTER — Ambulatory Visit: Payer: 59

## 2015-10-09 DIAGNOSIS — Z1231 Encounter for screening mammogram for malignant neoplasm of breast: Secondary | ICD-10-CM

## 2015-11-22 DIAGNOSIS — I10 Essential (primary) hypertension: Secondary | ICD-10-CM | POA: Diagnosis not present

## 2015-11-22 DIAGNOSIS — E782 Mixed hyperlipidemia: Secondary | ICD-10-CM | POA: Diagnosis not present

## 2015-11-22 DIAGNOSIS — E669 Obesity, unspecified: Secondary | ICD-10-CM | POA: Diagnosis not present

## 2015-11-22 DIAGNOSIS — Z6830 Body mass index (BMI) 30.0-30.9, adult: Secondary | ICD-10-CM | POA: Diagnosis not present

## 2015-11-22 DIAGNOSIS — E039 Hypothyroidism, unspecified: Secondary | ICD-10-CM | POA: Diagnosis not present

## 2015-11-22 DIAGNOSIS — R7301 Impaired fasting glucose: Secondary | ICD-10-CM | POA: Diagnosis not present

## 2016-07-23 DIAGNOSIS — M150 Primary generalized (osteo)arthritis: Secondary | ICD-10-CM | POA: Diagnosis not present

## 2016-07-23 DIAGNOSIS — E782 Mixed hyperlipidemia: Secondary | ICD-10-CM | POA: Diagnosis not present

## 2016-07-23 DIAGNOSIS — I10 Essential (primary) hypertension: Secondary | ICD-10-CM | POA: Diagnosis not present

## 2016-07-23 DIAGNOSIS — J309 Allergic rhinitis, unspecified: Secondary | ICD-10-CM | POA: Diagnosis not present

## 2016-07-23 DIAGNOSIS — M810 Age-related osteoporosis without current pathological fracture: Secondary | ICD-10-CM | POA: Diagnosis not present

## 2016-07-23 DIAGNOSIS — I471 Supraventricular tachycardia: Secondary | ICD-10-CM | POA: Diagnosis not present

## 2016-07-23 DIAGNOSIS — Z0000 Encounter for general adult medical examination without abnormal findings: Secondary | ICD-10-CM | POA: Diagnosis not present

## 2016-07-23 DIAGNOSIS — Z23 Encounter for immunization: Secondary | ICD-10-CM | POA: Diagnosis not present

## 2016-07-23 DIAGNOSIS — I679 Cerebrovascular disease, unspecified: Secondary | ICD-10-CM | POA: Diagnosis not present

## 2016-07-23 DIAGNOSIS — E039 Hypothyroidism, unspecified: Secondary | ICD-10-CM | POA: Diagnosis not present

## 2016-07-23 DIAGNOSIS — R7301 Impaired fasting glucose: Secondary | ICD-10-CM | POA: Diagnosis not present

## 2016-07-24 DIAGNOSIS — L814 Other melanin hyperpigmentation: Secondary | ICD-10-CM | POA: Diagnosis not present

## 2016-07-24 DIAGNOSIS — D225 Melanocytic nevi of trunk: Secondary | ICD-10-CM | POA: Diagnosis not present

## 2016-07-24 DIAGNOSIS — C44519 Basal cell carcinoma of skin of other part of trunk: Secondary | ICD-10-CM | POA: Diagnosis not present

## 2016-07-24 DIAGNOSIS — C44619 Basal cell carcinoma of skin of left upper limb, including shoulder: Secondary | ICD-10-CM | POA: Diagnosis not present

## 2016-07-24 DIAGNOSIS — L821 Other seborrheic keratosis: Secondary | ICD-10-CM | POA: Diagnosis not present

## 2016-09-08 ENCOUNTER — Other Ambulatory Visit: Payer: Self-pay | Admitting: Internal Medicine

## 2016-09-08 ENCOUNTER — Other Ambulatory Visit: Payer: Self-pay | Admitting: Family Medicine

## 2016-09-08 DIAGNOSIS — Z85828 Personal history of other malignant neoplasm of skin: Secondary | ICD-10-CM | POA: Diagnosis not present

## 2016-09-08 DIAGNOSIS — L821 Other seborrheic keratosis: Secondary | ICD-10-CM | POA: Diagnosis not present

## 2016-09-08 DIAGNOSIS — Z1231 Encounter for screening mammogram for malignant neoplasm of breast: Secondary | ICD-10-CM

## 2016-09-09 DIAGNOSIS — M810 Age-related osteoporosis without current pathological fracture: Secondary | ICD-10-CM | POA: Diagnosis not present

## 2016-09-09 DIAGNOSIS — M8588 Other specified disorders of bone density and structure, other site: Secondary | ICD-10-CM | POA: Diagnosis not present

## 2016-10-09 ENCOUNTER — Ambulatory Visit
Admission: RE | Admit: 2016-10-09 | Discharge: 2016-10-09 | Disposition: A | Discharge status: Home / Self Care | Payer: Medicare Other | Source: Ambulatory Visit | Attending: Family Medicine | Admitting: Family Medicine

## 2016-10-09 DIAGNOSIS — Z1231 Encounter for screening mammogram for malignant neoplasm of breast: Secondary | ICD-10-CM | POA: Diagnosis not present

## 2016-11-06 DIAGNOSIS — H5213 Myopia, bilateral: Secondary | ICD-10-CM | POA: Diagnosis not present

## 2016-11-06 DIAGNOSIS — H524 Presbyopia: Secondary | ICD-10-CM | POA: Diagnosis not present

## 2016-11-06 DIAGNOSIS — Z961 Presence of intraocular lens: Secondary | ICD-10-CM | POA: Diagnosis not present

## 2016-11-06 DIAGNOSIS — H52203 Unspecified astigmatism, bilateral: Secondary | ICD-10-CM | POA: Diagnosis not present

## 2017-01-30 DIAGNOSIS — E782 Mixed hyperlipidemia: Secondary | ICD-10-CM | POA: Diagnosis not present

## 2017-01-30 DIAGNOSIS — R7301 Impaired fasting glucose: Secondary | ICD-10-CM | POA: Diagnosis not present

## 2017-01-30 DIAGNOSIS — I471 Supraventricular tachycardia: Secondary | ICD-10-CM | POA: Diagnosis not present

## 2017-01-30 DIAGNOSIS — I679 Cerebrovascular disease, unspecified: Secondary | ICD-10-CM | POA: Diagnosis not present

## 2017-01-30 DIAGNOSIS — I10 Essential (primary) hypertension: Secondary | ICD-10-CM | POA: Diagnosis not present

## 2017-01-30 DIAGNOSIS — J309 Allergic rhinitis, unspecified: Secondary | ICD-10-CM | POA: Diagnosis not present

## 2017-01-30 DIAGNOSIS — E039 Hypothyroidism, unspecified: Secondary | ICD-10-CM | POA: Diagnosis not present

## 2017-05-29 DIAGNOSIS — L8990 Pressure ulcer of unspecified site, unspecified stage: Secondary | ICD-10-CM | POA: Diagnosis not present

## 2017-05-29 DIAGNOSIS — M542 Cervicalgia: Secondary | ICD-10-CM | POA: Diagnosis not present

## 2017-06-15 DIAGNOSIS — B029 Zoster without complications: Secondary | ICD-10-CM | POA: Diagnosis not present

## 2017-08-18 DIAGNOSIS — R7301 Impaired fasting glucose: Secondary | ICD-10-CM | POA: Diagnosis not present

## 2017-08-18 DIAGNOSIS — I471 Supraventricular tachycardia: Secondary | ICD-10-CM | POA: Diagnosis not present

## 2017-08-18 DIAGNOSIS — Z0000 Encounter for general adult medical examination without abnormal findings: Secondary | ICD-10-CM | POA: Diagnosis not present

## 2017-08-18 DIAGNOSIS — M150 Primary generalized (osteo)arthritis: Secondary | ICD-10-CM | POA: Diagnosis not present

## 2017-08-18 DIAGNOSIS — M810 Age-related osteoporosis without current pathological fracture: Secondary | ICD-10-CM | POA: Diagnosis not present

## 2017-08-18 DIAGNOSIS — E7889 Other lipoprotein metabolism disorders: Secondary | ICD-10-CM | POA: Diagnosis not present

## 2017-08-18 DIAGNOSIS — B029 Zoster without complications: Secondary | ICD-10-CM | POA: Diagnosis not present

## 2017-08-18 DIAGNOSIS — I679 Cerebrovascular disease, unspecified: Secondary | ICD-10-CM | POA: Diagnosis not present

## 2017-08-18 DIAGNOSIS — I10 Essential (primary) hypertension: Secondary | ICD-10-CM | POA: Diagnosis not present

## 2017-08-18 DIAGNOSIS — E782 Mixed hyperlipidemia: Secondary | ICD-10-CM | POA: Diagnosis not present

## 2017-08-18 DIAGNOSIS — J309 Allergic rhinitis, unspecified: Secondary | ICD-10-CM | POA: Diagnosis not present

## 2017-08-18 DIAGNOSIS — E039 Hypothyroidism, unspecified: Secondary | ICD-10-CM | POA: Diagnosis not present

## 2017-09-01 ENCOUNTER — Other Ambulatory Visit: Payer: Self-pay | Admitting: Family Medicine

## 2017-09-01 DIAGNOSIS — Z1231 Encounter for screening mammogram for malignant neoplasm of breast: Secondary | ICD-10-CM

## 2017-09-17 DIAGNOSIS — Z23 Encounter for immunization: Secondary | ICD-10-CM | POA: Diagnosis not present

## 2017-09-24 DIAGNOSIS — H0014 Chalazion left upper eyelid: Secondary | ICD-10-CM | POA: Diagnosis not present

## 2017-10-12 ENCOUNTER — Ambulatory Visit
Admission: RE | Admit: 2017-10-12 | Discharge: 2017-10-12 | Disposition: A | Discharge status: Home / Self Care | Payer: Medicare Other | Source: Ambulatory Visit | Attending: Family Medicine | Admitting: Family Medicine

## 2017-10-12 DIAGNOSIS — Z1231 Encounter for screening mammogram for malignant neoplasm of breast: Secondary | ICD-10-CM

## 2017-11-09 DIAGNOSIS — Z961 Presence of intraocular lens: Secondary | ICD-10-CM | POA: Diagnosis not present

## 2018-02-16 DIAGNOSIS — E039 Hypothyroidism, unspecified: Secondary | ICD-10-CM | POA: Diagnosis not present

## 2018-02-16 DIAGNOSIS — E782 Mixed hyperlipidemia: Secondary | ICD-10-CM | POA: Diagnosis not present

## 2018-02-16 DIAGNOSIS — R7301 Impaired fasting glucose: Secondary | ICD-10-CM | POA: Diagnosis not present

## 2018-02-16 DIAGNOSIS — I10 Essential (primary) hypertension: Secondary | ICD-10-CM | POA: Diagnosis not present

## 2018-05-14 ENCOUNTER — Ambulatory Visit (INDEPENDENT_AMBULATORY_CARE_PROVIDER_SITE_OTHER): Payer: Medicare Other | Admitting: Physician Assistant

## 2018-05-14 ENCOUNTER — Ambulatory Visit (INDEPENDENT_AMBULATORY_CARE_PROVIDER_SITE_OTHER): Payer: Medicare Other

## 2018-05-14 ENCOUNTER — Encounter (INDEPENDENT_AMBULATORY_CARE_PROVIDER_SITE_OTHER): Payer: Self-pay | Admitting: Physician Assistant

## 2018-05-14 DIAGNOSIS — M79661 Pain in right lower leg: Secondary | ICD-10-CM

## 2018-05-14 MED ORDER — METHYLPREDNISOLONE 4 MG PO TBPK: ORAL_TABLET | ORAL | 0 refills | Status: DC

## 2018-05-14 NOTE — Progress Notes (Signed)
Office Visit Note   Patient: Anna Weiss           Date of Birth: Jun 17, 1925           MRN: [id number] Visit Date: 05/14/2018              Requested by: Mayra Neer, MD [address] [place], [place] [address] PCP: Mayra Neer, MD   Assessment & Plan: Visit Diagnoses:  1. Pain in right lower leg     Plan: Questionable stress fracture to the proximal tibia with underlying hematoma.  Because the patient is unable to take NSAIDs, I will call in a steroid taper.  She will rest, ice and elevate for the next few weeks.  Follow-up with [place] in 2 to 4 weeks if she is not any better.  Call with concerns or questions in the meantime.  Follow-Up Instructions: Return if symptoms worsen or fail to improve.   Orders:  Orders Placed This Encounter  Procedures  . XR Tibia/Fibula Right   No orders of the defined types were placed in this encounter.     Procedures: No procedures performed   Clinical Data: No additional findings.   Subjective: Chief Complaint  Patient presents with  . Right Lower Leg - Pain    HPI patient is a pleasant 84 year old female who presents to our clinic today with right lower leg pain, anterior aspect.  The pain began approximately 2 weeks ago and has progressively worsened.  The only injury she can think of is about 6 weeks ago she was going down the stairs she missed the last step but was able to catch herself from falling.  The pain she is having is to the anterolateral aspect of her right lower leg.  The pain she has is most problematic when she is fully weightbearing.  Mild pain at rest.  She has not taken any Tylenol or Advil for pain.  No numbness, tingling or burning.  She is on Plavix for a previous CVA.  Review of Systems as detailed in HPI.  All others reviewed and are negative.   Objective: Vital Signs: There were no vitals taken for this visit.  Physical Exam well-developed well-nourished female no acute distress.  Alert  and oriented x3.  Ortho Exam examination of her right lower leg reveals moderate diffuse tenderness to the entire lateral aspect from her proximal fibula all the way to her lateral malleolus.  No tenderness over the tibia.  No calf tenderness.  Full range of motion at the knee and ankle without pain.  Specialty Comments:  No specialty comments available.  Imaging: Xr Tibia/fibula Right  Result Date: 05/14/2018 Questionable callus formation at the proximal tibia indicating a stress fracture.    PMFS History: Patient Active Problem List   Diagnosis Date Noted  . Pain in right lower leg 05/14/2018   Past Medical History:  Diagnosis Date  . High cholesterol   . Hypertension   . Stroke [facility])    2003  . Tachycardia     History reviewed. No pertinent family history.  Past Surgical History:  Procedure Laterality Date  . ABDOMINAL HYSTERECTOMY     Social History   Occupational History  . Not on file  Tobacco Use  . Smoking status: Never Smoker  . Smokeless tobacco: Never Used  Substance and Sexual Activity  . Alcohol use: No  . Drug use: No  . Sexual activity: Not on file

## 2018-06-14 DIAGNOSIS — L821 Other seborrheic keratosis: Secondary | ICD-10-CM | POA: Diagnosis not present

## 2018-06-14 DIAGNOSIS — D1801 Hemangioma of skin and subcutaneous tissue: Secondary | ICD-10-CM | POA: Diagnosis not present

## 2018-06-14 DIAGNOSIS — Z85828 Personal history of other malignant neoplasm of skin: Secondary | ICD-10-CM | POA: Diagnosis not present

## 2018-06-14 DIAGNOSIS — L820 Inflamed seborrheic keratosis: Secondary | ICD-10-CM | POA: Diagnosis not present

## 2018-06-14 DIAGNOSIS — L814 Other melanin hyperpigmentation: Secondary | ICD-10-CM | POA: Diagnosis not present

## 2018-06-14 DIAGNOSIS — L570 Actinic keratosis: Secondary | ICD-10-CM | POA: Diagnosis not present

## 2018-06-14 DIAGNOSIS — D225 Melanocytic nevi of trunk: Secondary | ICD-10-CM | POA: Diagnosis not present

## 2018-09-03 ENCOUNTER — Other Ambulatory Visit: Payer: Self-pay | Admitting: Family Medicine

## 2018-09-03 DIAGNOSIS — Z1231 Encounter for screening mammogram for malignant neoplasm of breast: Secondary | ICD-10-CM

## 2018-09-06 DIAGNOSIS — M810 Age-related osteoporosis without current pathological fracture: Secondary | ICD-10-CM | POA: Diagnosis not present

## 2018-09-06 DIAGNOSIS — I471 Supraventricular tachycardia: Secondary | ICD-10-CM | POA: Diagnosis not present

## 2018-09-06 DIAGNOSIS — M150 Primary generalized (osteo)arthritis: Secondary | ICD-10-CM | POA: Diagnosis not present

## 2018-09-06 DIAGNOSIS — I10 Essential (primary) hypertension: Secondary | ICD-10-CM | POA: Diagnosis not present

## 2018-09-06 DIAGNOSIS — R7301 Impaired fasting glucose: Secondary | ICD-10-CM | POA: Diagnosis not present

## 2018-09-06 DIAGNOSIS — E782 Mixed hyperlipidemia: Secondary | ICD-10-CM | POA: Diagnosis not present

## 2018-09-06 DIAGNOSIS — J309 Allergic rhinitis, unspecified: Secondary | ICD-10-CM | POA: Diagnosis not present

## 2018-09-06 DIAGNOSIS — D692 Other nonthrombocytopenic purpura: Secondary | ICD-10-CM | POA: Diagnosis not present

## 2018-09-06 DIAGNOSIS — I679 Cerebrovascular disease, unspecified: Secondary | ICD-10-CM | POA: Diagnosis not present

## 2018-09-06 DIAGNOSIS — Z0000 Encounter for general adult medical examination without abnormal findings: Secondary | ICD-10-CM | POA: Diagnosis not present

## 2018-09-06 DIAGNOSIS — Z23 Encounter for immunization: Secondary | ICD-10-CM | POA: Diagnosis not present

## 2018-09-06 DIAGNOSIS — E039 Hypothyroidism, unspecified: Secondary | ICD-10-CM | POA: Diagnosis not present

## 2018-10-14 ENCOUNTER — Ambulatory Visit: Payer: Medicare Other

## 2018-11-23 DIAGNOSIS — J069 Acute upper respiratory infection, unspecified: Secondary | ICD-10-CM | POA: Diagnosis not present

## 2018-12-15 DIAGNOSIS — R05 Cough: Secondary | ICD-10-CM | POA: Diagnosis not present

## 2019-01-20 DIAGNOSIS — Z961 Presence of intraocular lens: Secondary | ICD-10-CM | POA: Diagnosis not present

## 2019-01-20 DIAGNOSIS — H524 Presbyopia: Secondary | ICD-10-CM | POA: Diagnosis not present

## 2019-01-20 DIAGNOSIS — H5213 Myopia, bilateral: Secondary | ICD-10-CM | POA: Diagnosis not present

## 2019-01-20 DIAGNOSIS — H52203 Unspecified astigmatism, bilateral: Secondary | ICD-10-CM | POA: Diagnosis not present

## 2019-01-20 DIAGNOSIS — H04123 Dry eye syndrome of bilateral lacrimal glands: Secondary | ICD-10-CM | POA: Diagnosis not present

## 2019-03-21 DIAGNOSIS — I10 Essential (primary) hypertension: Secondary | ICD-10-CM | POA: Diagnosis not present

## 2019-03-21 DIAGNOSIS — E782 Mixed hyperlipidemia: Secondary | ICD-10-CM | POA: Diagnosis not present

## 2019-03-21 DIAGNOSIS — I679 Cerebrovascular disease, unspecified: Secondary | ICD-10-CM | POA: Diagnosis not present

## 2019-03-21 DIAGNOSIS — R7301 Impaired fasting glucose: Secondary | ICD-10-CM | POA: Diagnosis not present

## 2019-03-21 DIAGNOSIS — E039 Hypothyroidism, unspecified: Secondary | ICD-10-CM | POA: Diagnosis not present

## 2019-08-10 DIAGNOSIS — Z23 Encounter for immunization: Secondary | ICD-10-CM | POA: Diagnosis not present

## 2019-09-09 DIAGNOSIS — H3581 Retinal edema: Secondary | ICD-10-CM | POA: Diagnosis not present

## 2019-09-09 DIAGNOSIS — Z961 Presence of intraocular lens: Secondary | ICD-10-CM | POA: Diagnosis not present

## 2019-09-14 DIAGNOSIS — H43813 Vitreous degeneration, bilateral: Secondary | ICD-10-CM | POA: Diagnosis not present

## 2019-09-14 DIAGNOSIS — H35371 Puckering of macula, right eye: Secondary | ICD-10-CM | POA: Diagnosis not present

## 2019-09-14 DIAGNOSIS — H34811 Central retinal vein occlusion, right eye, with macular edema: Secondary | ICD-10-CM | POA: Diagnosis not present

## 2019-09-14 DIAGNOSIS — D3132 Benign neoplasm of left choroid: Secondary | ICD-10-CM | POA: Diagnosis not present

## 2020-02-10 DIAGNOSIS — H35351 Cystoid macular degeneration, right eye: Secondary | ICD-10-CM | POA: Diagnosis not present

## 2020-02-10 DIAGNOSIS — Z961 Presence of intraocular lens: Secondary | ICD-10-CM | POA: Diagnosis not present

## 2020-02-10 DIAGNOSIS — E119 Type 2 diabetes mellitus without complications: Secondary | ICD-10-CM | POA: Diagnosis not present

## 2020-02-10 DIAGNOSIS — H34811 Central retinal vein occlusion, right eye, with macular edema: Secondary | ICD-10-CM | POA: Diagnosis not present

## 2020-03-31 DIAGNOSIS — Z23 Encounter for immunization: Secondary | ICD-10-CM | POA: Diagnosis not present

## 2021-02-22 DIAGNOSIS — E782 Mixed hyperlipidemia: Secondary | ICD-10-CM | POA: Diagnosis not present

## 2021-02-22 DIAGNOSIS — R7301 Impaired fasting glucose: Secondary | ICD-10-CM | POA: Diagnosis not present

## 2021-02-22 DIAGNOSIS — J309 Allergic rhinitis, unspecified: Secondary | ICD-10-CM | POA: Diagnosis not present

## 2021-02-22 DIAGNOSIS — E039 Hypothyroidism, unspecified: Secondary | ICD-10-CM | POA: Diagnosis not present

## 2021-02-22 DIAGNOSIS — I471 Supraventricular tachycardia: Secondary | ICD-10-CM | POA: Diagnosis not present

## 2021-02-22 DIAGNOSIS — D692 Other nonthrombocytopenic purpura: Secondary | ICD-10-CM | POA: Diagnosis not present

## 2021-02-22 DIAGNOSIS — M810 Age-related osteoporosis without current pathological fracture: Secondary | ICD-10-CM | POA: Diagnosis not present

## 2021-02-22 DIAGNOSIS — M150 Primary generalized (osteo)arthritis: Secondary | ICD-10-CM | POA: Diagnosis not present

## 2021-02-22 DIAGNOSIS — I679 Cerebrovascular disease, unspecified: Secondary | ICD-10-CM | POA: Diagnosis not present

## 2021-02-22 DIAGNOSIS — Z0000 Encounter for general adult medical examination without abnormal findings: Secondary | ICD-10-CM | POA: Diagnosis not present

## 2021-02-22 DIAGNOSIS — I10 Essential (primary) hypertension: Secondary | ICD-10-CM | POA: Diagnosis not present

## 2021-03-01 DIAGNOSIS — H35351 Cystoid macular degeneration, right eye: Secondary | ICD-10-CM | POA: Diagnosis not present

## 2021-03-01 DIAGNOSIS — H5213 Myopia, bilateral: Secondary | ICD-10-CM | POA: Diagnosis not present

## 2021-03-01 DIAGNOSIS — H524 Presbyopia: Secondary | ICD-10-CM | POA: Diagnosis not present

## 2021-03-01 DIAGNOSIS — Z961 Presence of intraocular lens: Secondary | ICD-10-CM | POA: Diagnosis not present

## 2021-03-01 DIAGNOSIS — H52203 Unspecified astigmatism, bilateral: Secondary | ICD-10-CM | POA: Diagnosis not present

## 2021-03-04 DIAGNOSIS — C4441 Basal cell carcinoma of skin of scalp and neck: Secondary | ICD-10-CM | POA: Diagnosis not present

## 2021-03-04 DIAGNOSIS — L821 Other seborrheic keratosis: Secondary | ICD-10-CM | POA: Diagnosis not present

## 2021-04-29 DIAGNOSIS — H00024 Hordeolum internum left upper eyelid: Secondary | ICD-10-CM | POA: Diagnosis not present

## 2021-11-29 DIAGNOSIS — R7301 Impaired fasting glucose: Secondary | ICD-10-CM | POA: Diagnosis not present

## 2021-11-29 DIAGNOSIS — E782 Mixed hyperlipidemia: Secondary | ICD-10-CM | POA: Diagnosis not present

## 2021-11-29 DIAGNOSIS — I10 Essential (primary) hypertension: Secondary | ICD-10-CM | POA: Diagnosis not present

## 2021-11-29 DIAGNOSIS — E039 Hypothyroidism, unspecified: Secondary | ICD-10-CM | POA: Diagnosis not present

## 2022-02-10 ENCOUNTER — Emergency Department (HOSPITAL_BASED_OUTPATIENT_CLINIC_OR_DEPARTMENT_OTHER): Payer: Medicare Other

## 2022-02-10 ENCOUNTER — Emergency Department (HOSPITAL_BASED_OUTPATIENT_CLINIC_OR_DEPARTMENT_OTHER): Payer: Medicare Other | Admitting: Radiology

## 2022-02-10 ENCOUNTER — Encounter (HOSPITAL_BASED_OUTPATIENT_CLINIC_OR_DEPARTMENT_OTHER): Payer: Self-pay

## 2022-02-10 ENCOUNTER — Emergency Department (HOSPITAL_BASED_OUTPATIENT_CLINIC_OR_DEPARTMENT_OTHER)
Admission: EM | Admit: 2022-02-10 | Discharge: 2022-02-10 | Disposition: A | Discharge status: Home / Self Care | Payer: Medicare Other | Attending: Emergency Medicine | Admitting: Emergency Medicine

## 2022-02-10 ENCOUNTER — Other Ambulatory Visit: Payer: Self-pay

## 2022-02-10 DIAGNOSIS — Z79899 Other long term (current) drug therapy: Secondary | ICD-10-CM | POA: Insufficient documentation

## 2022-02-10 DIAGNOSIS — Z043 Encounter for examination and observation following other accident: Secondary | ICD-10-CM | POA: Diagnosis not present

## 2022-02-10 DIAGNOSIS — R001 Bradycardia, unspecified: Secondary | ICD-10-CM | POA: Insufficient documentation

## 2022-02-10 DIAGNOSIS — M545 Low back pain, unspecified: Secondary | ICD-10-CM | POA: Diagnosis not present

## 2022-02-10 DIAGNOSIS — S32000A Wedge compression fracture of unspecified lumbar vertebra, initial encounter for closed fracture: Secondary | ICD-10-CM

## 2022-02-10 DIAGNOSIS — S32010A Wedge compression fracture of first lumbar vertebra, initial encounter for closed fracture: Secondary | ICD-10-CM | POA: Diagnosis not present

## 2022-02-10 DIAGNOSIS — M48061 Spinal stenosis, lumbar region without neurogenic claudication: Secondary | ICD-10-CM | POA: Diagnosis not present

## 2022-02-10 DIAGNOSIS — S32019A Unspecified fracture of first lumbar vertebra, initial encounter for closed fracture: Secondary | ICD-10-CM | POA: Insufficient documentation

## 2022-02-10 DIAGNOSIS — S3992XA Unspecified injury of lower back, initial encounter: Secondary | ICD-10-CM | POA: Diagnosis present

## 2022-02-10 DIAGNOSIS — I10 Essential (primary) hypertension: Secondary | ICD-10-CM | POA: Insufficient documentation

## 2022-02-10 DIAGNOSIS — W010XXA Fall on same level from slipping, tripping and stumbling without subsequent striking against object, initial encounter: Secondary | ICD-10-CM | POA: Diagnosis not present

## 2022-02-10 DIAGNOSIS — R102 Pelvic and perineal pain: Secondary | ICD-10-CM | POA: Diagnosis not present

## 2022-02-10 LAB — CBC
HCT: 38.2 % (ref 36.0–46.0)
Hemoglobin: 12.8 g/dL (ref 12.0–15.0)
MCH: 33.9 pg (ref 26.0–34.0)
MCHC: 33.5 g/dL (ref 30.0–36.0)
MCV: 101.1 fL — ABNORMAL HIGH (ref 80.0–100.0)
Platelets: 197 10*3/uL (ref 150–400)
RBC: 3.78 MIL/uL — ABNORMAL LOW (ref 3.87–5.11)
RDW: 14.5 % (ref 11.5–15.5)
WBC: 9.3 10*3/uL (ref 4.0–10.5)
nRBC: 0 % (ref 0.0–0.2)

## 2022-02-10 LAB — BASIC METABOLIC PANEL
Anion gap: 8 (ref 5–15)
BUN: 13 mg/dL (ref 8–23)
CO2: 31 mmol/L (ref 22–32)
Calcium: 9.4 mg/dL (ref 8.9–10.3)
Chloride: 100 mmol/L (ref 98–111)
Creatinine, Ser: 0.73 mg/dL (ref 0.44–1.00)
GFR, Estimated: >60 mL/min (ref >60)
Glucose, Bld: 114 mg/dL — ABNORMAL HIGH (ref 70–99)
Potassium: 3.5 mmol/L (ref 3.5–5.1)
Sodium: 139 mmol/L (ref 135–145)

## 2022-02-10 MED ORDER — LIDOCAINE 5 % EX PTCH
1.0000 | MEDICATED_PATCH | CUTANEOUS | Status: DC
  Administered 2022-02-10: 1 via TRANSDERMAL
  Filled 2022-02-10: qty 1

## 2022-02-10 MED ORDER — LIDOCAINE 5 % EX PTCH: 1.0000 | MEDICATED_PATCH | CUTANEOUS | 0 refills | Status: DC

## 2022-02-10 NOTE — ED Provider Notes (Signed)
?[facility] ?Provider Note ? ? ?CSN: [id number] ?Arrival date & time: 02/10/22  1338 ? ?  ? ?History ? ?Chief Complaint  ?Patient presents with  ? Fall  ? ? ?Anna Weiss is a 88 y.o. female. ? ? ?Fall ? ? ?Patient has a history of high cholesterol hypertension and stroke and bradycardia.  Pt states her heart rate is often in the 40s when she goes to the doctor.  She presents for low back pain after a mechanical fall on Saturday.  Pt states she was walking to the kitchen to get something out of the oven.   She slipped and fell injuring her lower back.  She hit her head but did not lose conscioussness and has a mild headache.  Pt states since the fall she has had pain in her lower back when walking.   ?She denies any numbness or weakness.  No fevers or chills.   No nausea or vomiting.   ?Home Medications ?Prior to Admission medications   ?Medication Sig Start Date End Date Taking? Authorizing Provider  ?lidocaine (LIDODERM) 5 % Place 1 patch onto the skin daily. Remove & Discard patch within 12 hours or as directed by MD 02/10/22  Yes Dorie Rank, MD  ?Calcium Carb-Cholecalciferol (CALCIUM 600 + D PO) Take 2 tablets by mouth daily.    [provider]  ?clopidogrel (PLAVIX) 75 MG tablet Take 75 mg by mouth daily with breakfast.    [provider]  ?ferrous fumarate (HEMOCYTE - 106 MG FE) 325 (106 FE) MG TABS tablet Take 1 tablet by mouth daily.    [provider]  ?hydrochlorothiazide (HYDRODIURIL) 25 MG tablet  10/02/16   [provider]  ?levothyroxine (SYNTHROID, LEVOTHROID) 50 MCG tablet Take 50 mcg by mouth daily before breakfast.    [provider]  ?methylcellulose (ARTIFICIAL TEARS) 1 % ophthalmic solution Place 1 drop into both eyes 2 (two) times daily.    [provider]  ?methylPREDNISolone (MEDROL DOSEPAK) 4 MG TBPK tablet Take as directed 05/14/18   Aundra Dubin, PA-C  ?Multiple Vitamins-Minerals (MULTIVITAMIN WITH  MINERALS) tablet Take 1 tablet by mouth daily.    [provider]  ?ondansetron (ZOFRAN ODT) 4 MG disintegrating tablet Take 1 tablet (4 mg total) by mouth every 8 (eight) hours as needed for nausea or vomiting. 01/02/14   Carmin Muskrat, MD  ?propranolol (INDERAL) 10 MG tablet Take 10 mg by mouth 2 (two) times daily.    [provider]  ?simvastatin (ZOCOR) 10 MG tablet Take 10 mg by mouth every evening.    [provider]  ?verapamil (VERELAN PM) 120 MG 24 hr capsule Take 120 mg by mouth every morning.    [provider]  ?   ? ?Allergies    ?Aspirin   ? ?Review of Systems   ?Review of Systems  ?Constitutional:  Negative for fever.  ? ?Physical Exam ?Updated Vital Signs ?BP (!) 163/38   Pulse (!) 33   Temp 98.2 ?F (36.8 ?C)   Resp 14   Ht 1.702 m ('5\' 7"'$ )   Wt 71.7 kg   SpO2 (!) 88%   BMI 24.75 kg/m?  ?Physical Exam ?Vitals and nursing note reviewed.  ?Constitutional:   ?   General: She is not in acute distress. ?HENT:  ?   Head: Normocephalic and atraumatic.  ?   Right Ear: External ear normal.  ?   Left Ear: External ear normal.  ?Eyes:  ?  General: No scleral icterus.    ?   Right eye: No discharge.     ?   Left eye: No discharge.  ?   Conjunctiva/sclera: Conjunctivae normal.  ?Neck:  ?   Trachea: No tracheal deviation.  ?Cardiovascular:  ?   Rate and Rhythm: Regular rhythm. Bradycardia present.  ?Pulmonary:  ?   Effort: Pulmonary effort is normal. No respiratory distress.  ?   Breath sounds: Normal breath sounds. No stridor. No wheezing or rales.  ?Abdominal:  ?   General: Bowel sounds are normal. There is no distension.  ?   Palpations: Abdomen is soft.  ?   Tenderness: There is no abdominal tenderness. There is no guarding or rebound.  ?Musculoskeletal:     ?   General: No deformity.  ?   Cervical back: Normal and neck supple. No tenderness.  ?   Thoracic back: Normal. No tenderness.  ?   Lumbar back: Tenderness present.  ?Skin: ?   General: Skin is warm and dry.  ?    Findings: No rash.  ?Neurological:  ?   General: No focal deficit present.  ?   Cranial Nerves: No cranial nerve deficit (no facial droop, extraocular movements intact, no slurred speech).  ?   Sensory: No sensory deficit.  ?   Motor: No abnormal muscle tone or seizure activity.  ?   Coordination: Coordination normal.  ?Psychiatric:     ?   Mood and Affect: Mood normal.  ? ? ?ED Results / Procedures / Treatments   ?Labs ?(all labs ordered are listed, but only abnormal results are displayed) ?Labs Reviewed  ?BASIC METABOLIC PANEL - Abnormal; Notable for the following components:  ?    Result Value  ? Glucose, Bld 114 (*)   ? All other components within normal limits  ?CBC - Abnormal; Notable for the following components:  ? RBC 3.78 (*)   ? MCV 101.1 (*)   ? All other components within normal limits  ? ? ?EKG ?EKG Interpretation ? ?Date/Time:  Monday February 10 2022 14:46:08 EDT ?Ventricular Rate:  36 ?PR Interval:    ?QRS Duration: 111 ?QT Interval:  578 ?QTC Calculation: 448 ?R Axis:   111 ?Text Interpretation: Junctional rhythm , new since last tracing Right axis deviation Repol abnrm suggests ischemia, anterolateral Since last tracing rate slower Confirmed by Dorie Rank [phone]) on 02/10/2022 3:14:03 PM ? ?Radiology ?DG Lumbar Spine Complete ? ?Result Date: 02/10/2022 ?CLINICAL DATA:  Fall, back pain EXAM: LUMBAR SPINE - COMPLETE 4+ VIEW COMPARISON:  04/20/2013 FINDINGS: Five lumbar type vertebral segments. Vertebral body heights and alignment are maintained. No fracture identified. Mild intervertebral disc space loss with associated degenerative endplate changes, most pronounced at L3-4. Moderate lower lumbar facet arthrosis. Prominent abdominal aortic atherosclerotic calcification. IMPRESSION: No osseous abnormality of the lumbar spine. Electronically Signed   By: Davina Poke D.O.   On: 02/10/2022 16:30  ? ?DG Pelvis 1-2 Views ? ?Result Date: 02/10/2022 ?CLINICAL DATA:  Fall, pain.  Lower and mid back pain.  EXAM: PELVIS - 1-2 VIEW COMPARISON:  Same day lumbar spine radiographs 02/10/2022. Radiographs of the pelvis 04/20/2013. FINDINGS: There is normal bony alignment. No evidence of acute osseous or articular abnormality. Degenerative changes of the bilateral femoroacetabular joints and lower lumbar spine. IMPRESSION: No evidence of acute osseous or articular abnormality on this single-view AP pelvic radiograph. Degenerative changes of the bilateral femoroacetabular joints and lower lumbar spine. Electronically Signed   By: Kellie Simmering D.O.  On: 02/10/2022 16:33  ? ?CT Lumbar Spine Wo Contrast ? ?Result Date: 02/10/2022 ?CLINICAL DATA:  Fall EXAM: CT LUMBAR SPINE WITHOUT CONTRAST TECHNIQUE: Multidetector CT imaging of the lumbar spine was performed without intravenous contrast administration. Multiplanar CT image reconstructions were also generated. RADIATION DOSE REDUCTION: This exam was performed according to the departmental dose-optimization program which includes automated exposure control, adjustment of the mA and/or kV according to patient size and/or use of iterative reconstruction technique. COMPARISON:  None. FINDINGS: Segmentation: 5 lumbar type vertebrae. Alignment: Grade 1 anterolisthesis at L3-4 Vertebrae: There is a mild wedge compression fracture of L1 with less than 25% height loss. This is likely acute or subacute. Paraspinal and other soft tissues: Small right pleural effusion. Calcific aortic atherosclerosis. Disc levels: At L3-4, there is moderate spinal canal stenosis due to combination of facet hypertrophy, disc bulge and ligamentum flavum redundancy. There is mild multifactorial stenosis of the spinal canal at L4-5. No neural impingement. IMPRESSION: 1. Mild L1 wedge compression fracture with less than 25% height loss, likely acute or subacute. 2. Moderate L3-4 and mild L4-5 spinal canal stenosis. 3. Small right pleural effusion. Aortic Atherosclerosis (ICD10-I70.0). Electronically Signed   By:  Ulyses Jarred M.D.   On: 02/10/2022 19:27   ? ?Procedures ?Procedures  ? ? ?Medications Ordered in ED ?Medications  ?lidocaine (LIDODERM) 5 % 1 patch (1 patch Transdermal Patch Applied 02/10/22 1733)  ? ? ?ED Course/

## 2022-02-10 NOTE — ED Triage Notes (Signed)
Patient here POV from Home. ? ?Patient states she fell on Saturday. Patient states she fell after tripping over herself. She fell backwards in the doorway. ? ?Does endorses hitting her Posterior Head. Complains of Lower Mid Back Pain that is [facility] in [place].  ? ?No LOC. Patient takes Plavix.  ? ?NAD Noted during Triage. A&Ox4. GCS 15. BIB Wheelchair. ?

## 2022-02-10 NOTE — ED Notes (Signed)
Pt verbalizes understanding of discharge instructions. Opportunity for questioning and answers were provided. Pt discharged from ED to home with son.    

## 2022-02-10 NOTE — ED Notes (Signed)
RN [place] notified of Pt's HR in the 30's. ?

## 2022-02-10 NOTE — Discharge Instructions (Signed)
As we discussed your heart rate was very low and I did recommend admission to the hospital.  Please follow-up with your doctor and return at any time if you start feeling lightheaded or dizzy or change your mind about being admitted to the hospital. ? ?The medications as needed for pain.  Consider following up with a spine doctor to make sure your back heals properly ?

## 2022-02-10 NOTE — ED Notes (Signed)
Patient transported to CT 

## 2022-02-10 NOTE — ED Notes (Addendum)
[facility] for a TLSO Brace at 738pm ?ReCalled [facility] at [facility] arrived at 845 w/TLSO Brace ?

## 2022-03-04 ENCOUNTER — Ambulatory Visit (INDEPENDENT_AMBULATORY_CARE_PROVIDER_SITE_OTHER): Payer: Medicare Other

## 2022-03-04 ENCOUNTER — Encounter: Payer: Self-pay | Admitting: Orthopaedic Surgery

## 2022-03-04 ENCOUNTER — Ambulatory Visit (INDEPENDENT_AMBULATORY_CARE_PROVIDER_SITE_OTHER): Payer: Medicare Other | Admitting: Orthopaedic Surgery

## 2022-03-04 VITALS — BP 163/63 | Ht 66.5 in | Wt 156.0 lb

## 2022-03-04 DIAGNOSIS — M545 Low back pain, unspecified: Secondary | ICD-10-CM | POA: Diagnosis not present

## 2022-03-04 NOTE — Progress Notes (Signed)
? ?Office Visit Note ?  ?Patient: Anna Weiss           ?Date of Birth: Jan 03, 1925           ?MRN: [id number] ?Visit Date: 03/04/2022 ?             ?Requested by: Mayra Neer, MD ?[address] ?[address] ?[place],  [place] [address] ?PCP: Mayra Neer, MD ? ? ?Assessment & Plan: ?Visit Diagnoses:  ?1. Acute bilateral low back pain, unspecified whether sciatica present   ? ? ?Plan: Return in 1 month 2 view lumbar x-ray on return to assess L1 compression fracture. ? ?Follow-Up Instructions: Return in about 1 month (around 04/03/2022).  ? ?Orders:  ?Orders Placed This Encounter  ?Procedures  ? XR Lumbar Spine 2-3 Views  ? ?No orders of the defined types were placed in this encounter. ? ? ? ? Procedures: ?No procedures performed ? ? ?Clinical Data: ?No additional findings. ? ? ?Subjective: ?Chief Complaint  ?Patient presents with  ? Lower Back - Fracture  ?  Fall 02/08/2022  ? ? ?HPI 88 year old female had a fall on 02/08/2022 with 25% L1 compression fracture documented by CT scan.  She has been amatory with a 4 pronged cane right hand and states she is gotten greater than 50% pain relief since her fall now almost 1 month ago.  She does take calcium and vitamin D.  No associated bowel or bladder symptoms. ? ?Review of Systems all other systems are noncontributory to HPI. ? ? ?Objective: ?Vital Signs: BP (!) 163/63   Ht 5' 6.5" (1.689 m)   Wt 156 lb (70.8 kg)   BMI 24.80 kg/m?  ? ?Physical Exam ?Constitutional:   ?   Appearance: She is well-developed.  ?HENT:  ?   Head: Normocephalic.  ?   Right Ear: External ear normal.  ?   Left Ear: External ear normal. There is no impacted cerumen.  ?Eyes:  ?   Pupils: Pupils are equal, round, and reactive to light.  ?Neck:  ?   Thyroid: No thyromegaly.  ?   Trachea: No tracheal deviation.  ?Cardiovascular:  ?   Rate and Rhythm: Normal rate.  ?Pulmonary:  ?   Effort: Pulmonary effort is normal.  ?Abdominal:  ?   Palpations: Abdomen is soft.  ?Musculoskeletal:  ?   Cervical  back: No rigidity.  ?Skin: ?   General: Skin is warm and dry.  ?Neurological:  ?   Mental Status: She is alert and oriented to person, place, and time.  ?Psychiatric:     ?   Behavior: Behavior normal.  ? ? ?Ortho Exam patient is slow getting from sitting to standing she is amatory with normal heel-toe gait.  No rash over exposed skin. ? ?Specialty Comments:  ?No specialty comments available. ? ?Imaging: ?XR Lumbar Spine 2-3 Views ? ?Result Date: 03/04/2022 ?AP lateral lumbar spine images are obtained and reviewed.  There is some calcification of abdominal aorta consistent with PAD.  For percent L1 compression fracture comparison to 02/10/2022 images.  Grade 1 anterolisthesis L3-4. Impression: 50% L1 compression fracture with slight progression from 02/10/2022 images.  ? ? ?PMFS History: ?Patient Active Problem List  ? Diagnosis Date Noted  ? Pain in right lower leg 05/14/2018  ? ?Past Medical History:  ?Diagnosis Date  ? High cholesterol   ? Hypertension   ? Stroke [facility])   ? 2003  ? Tachycardia   ?  ?No family history on file.  ?Past  Surgical History:  ?Procedure Laterality Date  ? ABDOMINAL HYSTERECTOMY    ? ?Social History  ? ?Occupational History  ? Not on file  ?Tobacco Use  ? Smoking status: Never  ? Smokeless tobacco: Never  ?Substance and Sexual Activity  ? Alcohol use: No  ? Drug use: No  ? Sexual activity: Not on file  ? ? ? ? ? ? ?

## 2022-04-08 ENCOUNTER — Ambulatory Visit (INDEPENDENT_AMBULATORY_CARE_PROVIDER_SITE_OTHER): Payer: Medicare Other | Admitting: Orthopaedic Surgery

## 2022-04-08 ENCOUNTER — Encounter: Payer: Self-pay | Admitting: Orthopaedic Surgery

## 2022-04-08 ENCOUNTER — Ambulatory Visit (INDEPENDENT_AMBULATORY_CARE_PROVIDER_SITE_OTHER): Payer: Medicare Other

## 2022-04-08 VITALS — Ht 66.5 in | Wt 156.0 lb

## 2022-04-08 DIAGNOSIS — S2232XA Fracture of one rib, left side, initial encounter for closed fracture: Secondary | ICD-10-CM

## 2022-04-08 DIAGNOSIS — S32000A Wedge compression fracture of unspecified lumbar vertebra, initial encounter for closed fracture: Secondary | ICD-10-CM | POA: Insufficient documentation

## 2022-04-08 DIAGNOSIS — R0781 Pleurodynia: Secondary | ICD-10-CM

## 2022-04-08 DIAGNOSIS — M545 Low back pain, unspecified: Secondary | ICD-10-CM

## 2022-04-08 DIAGNOSIS — S32010S Wedge compression fracture of first lumbar vertebra, sequela: Secondary | ICD-10-CM

## 2022-04-08 DIAGNOSIS — S2239XA Fracture of one rib, unspecified side, initial encounter for closed fracture: Secondary | ICD-10-CM | POA: Insufficient documentation

## 2022-04-08 NOTE — Progress Notes (Signed)
? ?Office Visit Note ?  ?Patient: Anna Weiss           ?Date of Birth: [birth year]           ?MRN: [id number] ?Visit Date: 04/08/2022 ?             ?Requested by: Mayra Neer, MD ?[address] ?[address] ?[place],  [place] [address] ?PCP: Mayra Neer, MD ? ? ?Assessment & Plan: ?Visit Diagnoses:  ?1. Acute bilateral low back pain, unspecified whether sciatica present   ?2. Rib pain on left side   ?3. Closed fracture of one rib of left side, initial encounter   ?4. Compression fracture of L1 vertebra, sequela   ? ? ?Plan: Fall prevention discussed.  She can follow-up if she has problems.  She discussed urgent addressing any dyspnea.  He has been 10 days since she fell and broke left rib and she seems to be getting better every day.  She can follow-up if she has ongoing problems. ? ?Follow-Up Instructions: No follow-ups on file.  ? ?Orders:  ?Orders Placed This Encounter  ?Procedures  ? XR Lumbar Spine 2-3 Views  ? XR Ribs Unilateral Left  ? ?No orders of the defined types were placed in this encounter. ? ? ? ? Procedures: ?No procedures performed ? ? ?Clinical Data: ?No additional findings. ? ? ?Subjective: ?Chief Complaint  ?Patient presents with  ? Lower Back - Follow-up, Fracture  ?  Fall 02/08/2022  ? left rib pain  ?  Fall 03/28/2022  ? ? ?HPI fall prevention again discussed.  She fell 10 days ago between shower and sink has bruising mid axillary line and is on Plavix.  Tenderness palpation appears that the eighth possibly the ninth rib mid axillary line single rib fracture. ? ?Review of Systems updated unchanged ? ? ?Objective: ?Vital Signs: Ht 5' 6.5" (1.689 m)   Wt 156 lb (70.8 kg)   BMI 24.80 kg/m?  ? ?Physical Exam ?Constitutional:   ?   Appearance: She is well-developed.  ?HENT:  ?   Head: Normocephalic.  ?   Right Ear: External ear normal.  ?   Left Ear: External ear normal. There is no impacted cerumen.  ?Eyes:  ?   Pupils: Pupils are equal, round, and reactive to light.  ?Neck:  ?   Thyroid: No  thyromegaly.  ?   Trachea: No tracheal deviation.  ?Cardiovascular:  ?   Rate and Rhythm: Normal rate.  ?Pulmonary:  ?   Effort: Pulmonary effort is normal.  ?   Comments: Ecchymosis palpable tenderness mid axillary line eighth rib. ?Abdominal:  ?   Palpations: Abdomen is soft.  ?Musculoskeletal:  ?   Cervical back: No rigidity.  ?Skin: ?   General: Skin is warm and dry.  ?Neurological:  ?   Mental Status: She is alert and oriented to person, place, and time.  ?Psychiatric:     ?   Behavior: Behavior normal.  ? ? ?Ortho Exam ecchymosis left chest wall tenderness palpation over the underlying rib.  No dyspnea. ? ?Specialty Comments:  ?No specialty comments available. ? ?Imaging: ?XR Lumbar Spine 2-3 Views ? ?Result Date: 04/08/2022 ?AP lateral lumbar spine images are obtained and reviewed this shows 30 to 35% L1 compression fracture compared to a previous CT scan. Impression stable 35% L1 compression fracture. ? ?XR Ribs Unilateral Left ? ?Result Date: 04/08/2022 ?Left rib films are obtained.  There is a single rib fracture mid axillary line either left eighth  or ninth rib.  Top of the lung field is not visualized for count down from the top.  Osteopenia with patient's age as expected. Impression left eighth or ninth rib fracture without pneumothorax.  ? ? ?PMFS History: ?Patient Active Problem List  ? Diagnosis Date Noted  ? Rib fracture 04/08/2022  ? Lumbar compression fracture ([facility] [place]) 04/08/2022  ? Pain in right lower leg 05/14/2018  ? ?Past Medical History:  ?Diagnosis Date  ? High cholesterol   ? Hypertension   ? Stroke [facility])   ? 2003  ? Tachycardia   ?  ?No family history on file.  ?Past Surgical History:  ?Procedure Laterality Date  ? ABDOMINAL HYSTERECTOMY    ? ?Social History  ? ?Occupational History  ? Not on file  ?Tobacco Use  ? Smoking status: Never  ? Smokeless tobacco: Never  ?Substance and Sexual Activity  ? Alcohol use: No  ? Drug use: No  ? Sexual activity: Not on file  ? ? ? ? ? ? ?

## 2022-06-20 DIAGNOSIS — I10 Essential (primary) hypertension: Secondary | ICD-10-CM | POA: Diagnosis not present

## 2022-06-20 DIAGNOSIS — M8088XA Other osteoporosis with current pathological fracture, vertebra(e), initial encounter for fracture: Secondary | ICD-10-CM | POA: Diagnosis not present

## 2022-06-20 DIAGNOSIS — R7301 Impaired fasting glucose: Secondary | ICD-10-CM | POA: Diagnosis not present

## 2022-06-20 DIAGNOSIS — J309 Allergic rhinitis, unspecified: Secondary | ICD-10-CM | POA: Diagnosis not present

## 2022-06-20 DIAGNOSIS — E782 Mixed hyperlipidemia: Secondary | ICD-10-CM | POA: Diagnosis not present

## 2022-06-20 DIAGNOSIS — I471 Supraventricular tachycardia: Secondary | ICD-10-CM | POA: Diagnosis not present

## 2022-06-20 DIAGNOSIS — M150 Primary generalized (osteo)arthritis: Secondary | ICD-10-CM | POA: Diagnosis not present

## 2022-06-20 DIAGNOSIS — M810 Age-related osteoporosis without current pathological fracture: Secondary | ICD-10-CM | POA: Diagnosis not present

## 2022-06-20 DIAGNOSIS — Z0000 Encounter for general adult medical examination without abnormal findings: Secondary | ICD-10-CM | POA: Diagnosis not present

## 2022-06-20 DIAGNOSIS — E039 Hypothyroidism, unspecified: Secondary | ICD-10-CM | POA: Diagnosis not present

## 2022-06-20 DIAGNOSIS — I679 Cerebrovascular disease, unspecified: Secondary | ICD-10-CM | POA: Diagnosis not present

## 2022-06-20 DIAGNOSIS — D692 Other nonthrombocytopenic purpura: Secondary | ICD-10-CM | POA: Diagnosis not present

## 2022-07-14 DIAGNOSIS — Z961 Presence of intraocular lens: Secondary | ICD-10-CM | POA: Diagnosis not present

## 2022-07-14 DIAGNOSIS — H348112 Central retinal vein occlusion, right eye, stable: Secondary | ICD-10-CM | POA: Diagnosis not present

## 2022-08-23 DIAGNOSIS — Z23 Encounter for immunization: Secondary | ICD-10-CM | POA: Diagnosis not present

## 2022-09-24 ENCOUNTER — Inpatient Hospital Stay (HOSPITAL_COMMUNITY): Payer: Medicare Other

## 2022-09-24 ENCOUNTER — Inpatient Hospital Stay (HOSPITAL_COMMUNITY)
Admission: EM | Admit: 2022-09-24 | Discharge: 2022-09-28 | DRG: 482 | Disposition: A | Discharge status: Rehabilitation Facility | Payer: Medicare Other | Attending: Internal Medicine | Admitting: Internal Medicine

## 2022-09-24 ENCOUNTER — Emergency Department (HOSPITAL_COMMUNITY): Payer: Medicare Other

## 2022-09-24 ENCOUNTER — Encounter (HOSPITAL_COMMUNITY): Payer: Self-pay | Admitting: Internal Medicine

## 2022-09-24 DIAGNOSIS — Z66 Do not resuscitate: Secondary | ICD-10-CM | POA: Diagnosis present

## 2022-09-24 DIAGNOSIS — R102 Pelvic and perineal pain: Secondary | ICD-10-CM | POA: Diagnosis not present

## 2022-09-24 DIAGNOSIS — R079 Chest pain, unspecified: Secondary | ICD-10-CM | POA: Diagnosis not present

## 2022-09-24 DIAGNOSIS — S72002A Fracture of unspecified part of neck of left femur, initial encounter for closed fracture: Secondary | ICD-10-CM | POA: Diagnosis not present

## 2022-09-24 DIAGNOSIS — J181 Lobar pneumonia, unspecified organism: Secondary | ICD-10-CM | POA: Insufficient documentation

## 2022-09-24 DIAGNOSIS — J9811 Atelectasis: Secondary | ICD-10-CM | POA: Diagnosis not present

## 2022-09-24 DIAGNOSIS — S0003XA Contusion of scalp, initial encounter: Secondary | ICD-10-CM | POA: Insufficient documentation

## 2022-09-24 DIAGNOSIS — Z886 Allergy status to analgesic agent status: Secondary | ICD-10-CM | POA: Diagnosis not present

## 2022-09-24 DIAGNOSIS — Z79899 Other long term (current) drug therapy: Secondary | ICD-10-CM | POA: Diagnosis not present

## 2022-09-24 DIAGNOSIS — T8142XA Infection following a procedure, deep incisional surgical site, initial encounter: Secondary | ICD-10-CM | POA: Diagnosis not present

## 2022-09-24 DIAGNOSIS — Z9889 Other specified postprocedural states: Secondary | ICD-10-CM | POA: Diagnosis not present

## 2022-09-24 DIAGNOSIS — R001 Bradycardia, unspecified: Secondary | ICD-10-CM | POA: Insufficient documentation

## 2022-09-24 DIAGNOSIS — G9389 Other specified disorders of brain: Secondary | ICD-10-CM | POA: Diagnosis not present

## 2022-09-24 DIAGNOSIS — S72142A Displaced intertrochanteric fracture of left femur, initial encounter for closed fracture: Secondary | ICD-10-CM | POA: Diagnosis present

## 2022-09-24 DIAGNOSIS — Z7902 Long term (current) use of antithrombotics/antiplatelets: Secondary | ICD-10-CM

## 2022-09-24 DIAGNOSIS — T8149XA Infection following a procedure, other surgical site, initial encounter: Secondary | ICD-10-CM | POA: Diagnosis not present

## 2022-09-24 DIAGNOSIS — W010XXA Fall on same level from slipping, tripping and stumbling without subsequent striking against object, initial encounter: Secondary | ICD-10-CM | POA: Diagnosis present

## 2022-09-24 DIAGNOSIS — D638 Anemia in other chronic diseases classified elsewhere: Secondary | ICD-10-CM | POA: Diagnosis not present

## 2022-09-24 DIAGNOSIS — Z01818 Encounter for other preprocedural examination: Secondary | ICD-10-CM | POA: Diagnosis not present

## 2022-09-24 DIAGNOSIS — Z79890 Hormone replacement therapy (postmenopausal): Secondary | ICD-10-CM | POA: Diagnosis not present

## 2022-09-24 DIAGNOSIS — I10 Essential (primary) hypertension: Secondary | ICD-10-CM | POA: Diagnosis not present

## 2022-09-24 DIAGNOSIS — Z8673 Personal history of transient ischemic attack (TIA), and cerebral infarction without residual deficits: Secondary | ICD-10-CM

## 2022-09-24 DIAGNOSIS — D62 Acute posthemorrhagic anemia: Secondary | ICD-10-CM | POA: Diagnosis not present

## 2022-09-24 DIAGNOSIS — E876 Hypokalemia: Secondary | ICD-10-CM | POA: Diagnosis not present

## 2022-09-24 DIAGNOSIS — J189 Pneumonia, unspecified organism: Secondary | ICD-10-CM | POA: Diagnosis not present

## 2022-09-24 DIAGNOSIS — L89892 Pressure ulcer of other site, stage 2: Secondary | ICD-10-CM | POA: Diagnosis not present

## 2022-09-24 DIAGNOSIS — S0003XD Contusion of scalp, subsequent encounter: Secondary | ICD-10-CM | POA: Diagnosis not present

## 2022-09-24 DIAGNOSIS — M810 Age-related osteoporosis without current pathological fracture: Secondary | ICD-10-CM | POA: Diagnosis present

## 2022-09-24 DIAGNOSIS — R918 Other nonspecific abnormal finding of lung field: Secondary | ICD-10-CM | POA: Diagnosis not present

## 2022-09-24 DIAGNOSIS — L24A9 Irritant contact dermatitis due friction or contact with other specified body fluids: Secondary | ICD-10-CM | POA: Diagnosis not present

## 2022-09-24 DIAGNOSIS — M62838 Other muscle spasm: Secondary | ICD-10-CM | POA: Diagnosis present

## 2022-09-24 DIAGNOSIS — S40012A Contusion of left shoulder, initial encounter: Secondary | ICD-10-CM | POA: Diagnosis present

## 2022-09-24 DIAGNOSIS — Z515 Encounter for palliative care: Secondary | ICD-10-CM | POA: Diagnosis not present

## 2022-09-24 DIAGNOSIS — E7800 Pure hypercholesterolemia, unspecified: Secondary | ICD-10-CM | POA: Diagnosis present

## 2022-09-24 DIAGNOSIS — K567 Ileus, unspecified: Secondary | ICD-10-CM | POA: Diagnosis not present

## 2022-09-24 DIAGNOSIS — S72122A Displaced fracture of lesser trochanter of left femur, initial encounter for closed fracture: Secondary | ICD-10-CM | POA: Diagnosis not present

## 2022-09-24 DIAGNOSIS — E039 Hypothyroidism, unspecified: Secondary | ICD-10-CM | POA: Diagnosis not present

## 2022-09-24 DIAGNOSIS — T8452XA Infection and inflammatory reaction due to internal left hip prosthesis, initial encounter: Secondary | ICD-10-CM | POA: Diagnosis not present

## 2022-09-24 DIAGNOSIS — M25552 Pain in left hip: Secondary | ICD-10-CM | POA: Diagnosis not present

## 2022-09-24 DIAGNOSIS — M7989 Other specified soft tissue disorders: Secondary | ICD-10-CM | POA: Diagnosis not present

## 2022-09-24 DIAGNOSIS — S0990XA Unspecified injury of head, initial encounter: Secondary | ICD-10-CM | POA: Diagnosis not present

## 2022-09-24 DIAGNOSIS — Y92009 Unspecified place in unspecified non-institutional (private) residence as the place of occurrence of the external cause: Secondary | ICD-10-CM

## 2022-09-24 DIAGNOSIS — W19XXXA Unspecified fall, initial encounter: Secondary | ICD-10-CM | POA: Diagnosis not present

## 2022-09-24 DIAGNOSIS — E871 Hypo-osmolality and hyponatremia: Secondary | ICD-10-CM | POA: Diagnosis not present

## 2022-09-24 DIAGNOSIS — Y793 Surgical instruments, materials and orthopedic devices (including sutures) associated with adverse incidents: Secondary | ICD-10-CM | POA: Diagnosis not present

## 2022-09-24 DIAGNOSIS — R9431 Abnormal electrocardiogram [ECG] [EKG]: Secondary | ICD-10-CM | POA: Diagnosis not present

## 2022-09-24 DIAGNOSIS — I119 Hypertensive heart disease without heart failure: Secondary | ICD-10-CM | POA: Diagnosis not present

## 2022-09-24 DIAGNOSIS — I739 Peripheral vascular disease, unspecified: Secondary | ICD-10-CM | POA: Diagnosis not present

## 2022-09-24 DIAGNOSIS — D72829 Elevated white blood cell count, unspecified: Secondary | ICD-10-CM | POA: Diagnosis not present

## 2022-09-24 DIAGNOSIS — R911 Solitary pulmonary nodule: Secondary | ICD-10-CM | POA: Diagnosis not present

## 2022-09-24 DIAGNOSIS — S72142S Displaced intertrochanteric fracture of left femur, sequela: Secondary | ICD-10-CM | POA: Diagnosis not present

## 2022-09-24 DIAGNOSIS — I700 Atherosclerosis of aorta: Secondary | ICD-10-CM | POA: Diagnosis not present

## 2022-09-24 DIAGNOSIS — J90 Pleural effusion, not elsewhere classified: Secondary | ICD-10-CM | POA: Diagnosis not present

## 2022-09-24 DIAGNOSIS — R627 Adult failure to thrive: Secondary | ICD-10-CM | POA: Diagnosis not present

## 2022-09-24 DIAGNOSIS — R0902 Hypoxemia: Secondary | ICD-10-CM | POA: Diagnosis not present

## 2022-09-24 DIAGNOSIS — Z7189 Other specified counseling: Secondary | ICD-10-CM | POA: Diagnosis not present

## 2022-09-24 DIAGNOSIS — G319 Degenerative disease of nervous system, unspecified: Secondary | ICD-10-CM | POA: Diagnosis not present

## 2022-09-24 DIAGNOSIS — S72009A Fracture of unspecified part of neck of unspecified femur, initial encounter for closed fracture: Secondary | ICD-10-CM | POA: Diagnosis not present

## 2022-09-24 DIAGNOSIS — K9189 Other postprocedural complications and disorders of digestive system: Secondary | ICD-10-CM | POA: Diagnosis not present

## 2022-09-24 DIAGNOSIS — S72142D Displaced intertrochanteric fracture of left femur, subsequent encounter for closed fracture with routine healing: Secondary | ICD-10-CM | POA: Diagnosis not present

## 2022-09-24 DIAGNOSIS — I959 Hypotension, unspecified: Secondary | ICD-10-CM | POA: Diagnosis not present

## 2022-09-24 DIAGNOSIS — B370 Candidal stomatitis: Secondary | ICD-10-CM | POA: Diagnosis not present

## 2022-09-24 DIAGNOSIS — H61122 Hematoma of pinna, left ear: Secondary | ICD-10-CM | POA: Diagnosis not present

## 2022-09-24 DIAGNOSIS — R9082 White matter disease, unspecified: Secondary | ICD-10-CM | POA: Diagnosis not present

## 2022-09-24 DIAGNOSIS — W010XXD Fall on same level from slipping, tripping and stumbling without subsequent striking against object, subsequent encounter: Secondary | ICD-10-CM | POA: Diagnosis present

## 2022-09-24 DIAGNOSIS — M47812 Spondylosis without myelopathy or radiculopathy, cervical region: Secondary | ICD-10-CM | POA: Diagnosis not present

## 2022-09-24 DIAGNOSIS — M1612 Unilateral primary osteoarthritis, left hip: Secondary | ICD-10-CM | POA: Diagnosis not present

## 2022-09-24 DIAGNOSIS — N390 Urinary tract infection, site not specified: Secondary | ICD-10-CM | POA: Diagnosis not present

## 2022-09-24 DIAGNOSIS — M19012 Primary osteoarthritis, left shoulder: Secondary | ICD-10-CM | POA: Diagnosis not present

## 2022-09-24 DIAGNOSIS — Y95 Nosocomial condition: Secondary | ICD-10-CM | POA: Diagnosis not present

## 2022-09-24 DIAGNOSIS — R339 Retention of urine, unspecified: Secondary | ICD-10-CM | POA: Diagnosis not present

## 2022-09-24 LAB — I-STAT CHEM 8, ED
BUN: 10 mg/dL (ref 8–23)
Calcium, Ion: 1.17 mmol/L (ref 1.15–1.40)
Chloride: 96 mmol/L — ABNORMAL LOW (ref 98–111)
Creatinine, Ser: 0.7 mg/dL (ref 0.44–1.00)
Glucose, Bld: 166 mg/dL — ABNORMAL HIGH (ref 70–99)
HCT: 38 % (ref 36.0–46.0)
Hemoglobin: 12.9 g/dL (ref 12.0–15.0)
Potassium: 3.8 mmol/L (ref 3.5–5.1)
Sodium: 135 mmol/L (ref 135–145)
TCO2: 26 mmol/L (ref 22–32)

## 2022-09-24 LAB — COMPREHENSIVE METABOLIC PANEL
ALT: 18 U/L (ref 0–44)
AST: 22 U/L (ref 15–41)
Albumin: 3.8 g/dL (ref 3.5–5.0)
Alkaline Phosphatase: 51 U/L (ref 38–126)
Anion gap: 9 (ref 5–15)
BUN: 9 mg/dL (ref 8–23)
CO2: 27 mmol/L (ref 22–32)
Calcium: 9.5 mg/dL (ref 8.9–10.3)
Chloride: 100 mmol/L (ref 98–111)
Creatinine, Ser: 0.8 mg/dL (ref 0.44–1.00)
GFR, Estimated: >60 mL/min (ref >60)
Glucose, Bld: 163 mg/dL — ABNORMAL HIGH (ref 70–99)
Potassium: 3.9 mmol/L (ref 3.5–5.1)
Sodium: 136 mmol/L (ref 135–145)
Total Bilirubin: 1 mg/dL (ref 0.3–1.2)
Total Protein: 6 g/dL — ABNORMAL LOW (ref 6.5–8.1)

## 2022-09-24 LAB — CBC
HCT: 36.8 % (ref 36.0–46.0)
Hemoglobin: 12.8 g/dL (ref 12.0–15.0)
MCH: 36.2 pg — ABNORMAL HIGH (ref 26.0–34.0)
MCHC: 34.8 g/dL (ref 30.0–36.0)
MCV: 104 fL — ABNORMAL HIGH (ref 80.0–100.0)
Platelets: 213 10*3/uL (ref 150–400)
RBC: 3.54 MIL/uL — ABNORMAL LOW (ref 3.87–5.11)
RDW: 13.4 % (ref 11.5–15.5)
WBC: 9.8 10*3/uL (ref 4.0–10.5)
nRBC: 0 % (ref 0.0–0.2)

## 2022-09-24 LAB — PROTIME-INR
INR: 1.1 (ref 0.8–1.2)
Prothrombin Time: 14.2 seconds (ref 11.4–15.2)

## 2022-09-24 LAB — URINALYSIS, ROUTINE W REFLEX MICROSCOPIC
Bilirubin Urine: NEGATIVE
Glucose, UA: NEGATIVE mg/dL
Hgb urine dipstick: NEGATIVE
Ketones, ur: 5 mg/dL — AB
Leukocytes,Ua: NEGATIVE
Nitrite: NEGATIVE
Protein, ur: NEGATIVE mg/dL
Specific Gravity, Urine: 1.014 (ref 1.005–1.030)
pH: 7 (ref 5.0–8.0)

## 2022-09-24 LAB — LACTIC ACID, PLASMA: Lactic Acid, Venous: 1.2 mmol/L (ref 0.5–1.9)

## 2022-09-24 LAB — TSH: TSH: 1.911 u[IU]/mL (ref 0.350–4.500)

## 2022-09-24 LAB — SAMPLE TO BLOOD BANK

## 2022-09-24 LAB — ETHANOL: Alcohol, Ethyl (B): <10 mg/dL (ref <10)

## 2022-09-24 MED ORDER — SENNOSIDES-DOCUSATE SODIUM 8.6-50 MG PO TABS: 1.0000 | ORAL_TABLET | Freq: Every evening | ORAL | Status: DC | PRN

## 2022-09-24 MED ORDER — POLYVINYL ALCOHOL 1.4 % OP SOLN
1.0000 [drp] | Freq: Two times a day (BID) | OPHTHALMIC | Status: DC
  Administered 2022-09-25 – 2022-09-28 (×6): 1 [drp] via OPHTHALMIC
  Filled 2022-09-24: qty 15

## 2022-09-24 MED ORDER — HEPARIN SODIUM (PORCINE) 5000 UNIT/ML IJ SOLN
5000.0000 [IU] | Freq: Two times a day (BID) | INTRAMUSCULAR | Status: DC
  Administered 2022-09-26 – 2022-09-28 (×5): 5000 [IU] via SUBCUTANEOUS
  Filled 2022-09-24 (×6): qty 1

## 2022-09-24 MED ORDER — HYDROCHLOROTHIAZIDE 25 MG PO TABS
25.0000 mg | ORAL_TABLET | Freq: Every day | ORAL | Status: DC
  Administered 2022-09-26 – 2022-09-28 (×3): 25 mg via ORAL
  Filled 2022-09-24 (×4): qty 1

## 2022-09-24 MED ORDER — LIDOCAINE HCL (PF) 1 % IJ SOLN
5.0000 mL | Freq: Once | INTRAMUSCULAR | Status: AC
  Administered 2022-09-24: 5 mL
  Filled 2022-09-24: qty 5

## 2022-09-24 MED ORDER — LEVOTHYROXINE SODIUM 50 MCG PO TABS
50.0000 ug | ORAL_TABLET | Freq: Every day | ORAL | Status: DC
  Administered 2022-09-26 – 2022-09-28 (×3): 50 ug via ORAL
  Filled 2022-09-24 (×3): qty 1

## 2022-09-24 MED ORDER — HYDROCODONE-ACETAMINOPHEN 5-325 MG PO TABS
1.0000 | ORAL_TABLET | Freq: Four times a day (QID) | ORAL | Status: DC | PRN
  Administered 2022-09-26 – 2022-09-28 (×5): 1 via ORAL
  Filled 2022-09-24 (×5): qty 1

## 2022-09-24 MED ORDER — HYDRALAZINE HCL 25 MG PO TABS: 25.0000 mg | ORAL_TABLET | Freq: Four times a day (QID) | ORAL | Status: DC | PRN

## 2022-09-24 MED ORDER — HYDROMORPHONE HCL 1 MG/ML IJ SOLN
0.5000 mg | INTRAMUSCULAR | Status: DC | PRN
  Administered 2022-09-24 – 2022-09-26 (×4): 0.5 mg via INTRAVENOUS
  Filled 2022-09-24 (×4): qty 0.5

## 2022-09-24 MED ORDER — METHYLCELLULOSE 1 % OP SOLN: 1.0000 [drp] | Freq: Two times a day (BID) | OPHTHALMIC | Status: DC

## 2022-09-24 MED ORDER — ONDANSETRON HCL 4 MG/2ML IJ SOLN
INTRAMUSCULAR | Status: AC
  Administered 2022-09-24: 4 mg via INTRAVENOUS
  Filled 2022-09-24: qty 2

## 2022-09-24 MED ORDER — METOPROLOL TARTRATE 5 MG/5ML IV SOLN: 2.5000 mg | Freq: Four times a day (QID) | INTRAVENOUS | Status: DC | PRN

## 2022-09-24 MED ORDER — ONDANSETRON 4 MG PO TBDP
4.0000 mg | ORAL_TABLET | Freq: Three times a day (TID) | ORAL | Status: DC | PRN
  Administered 2022-09-24 – 2022-09-27 (×3): 4 mg via ORAL
  Filled 2022-09-24 (×3): qty 1

## 2022-09-24 MED ORDER — PROCHLORPERAZINE EDISYLATE 10 MG/2ML IJ SOLN
5.0000 mg | Freq: Once | INTRAMUSCULAR | Status: AC | PRN
  Administered 2022-09-24: 5 mg via INTRAVENOUS
  Filled 2022-09-24: qty 2

## 2022-09-24 MED ORDER — ONDANSETRON HCL 4 MG/2ML IJ SOLN: 4.0000 mg | Freq: Once | INTRAMUSCULAR | Status: AC

## 2022-09-24 MED ORDER — BISACODYL 5 MG PO TBEC: 5.0000 mg | DELAYED_RELEASE_TABLET | Freq: Every day | ORAL | Status: DC | PRN

## 2022-09-24 MED ORDER — SIMVASTATIN 20 MG PO TABS
10.0000 mg | ORAL_TABLET | Freq: Every evening | ORAL | Status: DC
  Administered 2022-09-26 – 2022-09-27 (×2): 10 mg via ORAL
  Filled 2022-09-24 (×2): qty 1

## 2022-09-24 MED ORDER — MORPHINE SULFATE (PF) 4 MG/ML IV SOLN
4.0000 mg | Freq: Once | INTRAVENOUS | Status: AC
  Administered 2022-09-24: 4 mg via INTRAVENOUS
  Filled 2022-09-24: qty 1

## 2022-09-24 NOTE — ED Triage Notes (Signed)
Pt BIB [facility] following falling at home. Pt reports remembering falling at home and crawling to the phone to call for help. Pt left leg short and rotated upon arrival. Pt nauseated and vomited en route. Pt taking plavix. Pt reports hx of a low HR.

## 2022-09-24 NOTE — ED Notes (Signed)
Pt noted to have bruising to left ear, left shoulder  and left forearm.

## 2022-09-24 NOTE — H&P (Signed)
History and Physical    Anna Weiss RAQ:[id number] DOB: [birth year] DOA: 09/24/2022  PCP: Mayra Neer, MD (Confirm with patient/family/NH records and if not entered, this has to be entered at [facility] point of entry) Patient coming from: Home  I have personally briefly reviewed patient's old medical records in [place]  Chief Complaint: I fell and broke my leg  HPI: Anna Weiss is a 88 y.o. female with medical history significant of HTN, chronic bradycardia/junctional rhythm, HTN, remote stroke 2003, sent to ED after a fall.  Patient uses a cane to ambulate at baseline, this morning, while walking, her cane slipped and patient fell on her left side, feet hurt left hip and left sided head. No LOC.  Patient reported immediate severe left hip pain and unable to support herself to stand on her feet again.  Denies any chest pain, lightheadedness before or during the fall.  Patient has a chronic bradycardia/junctional rhythm problem, was offered PPM years ago but declined.  ED Course: Bradycardia, heart rate in the 30s, blood pressure elevated systolic [id number], no hypoxia.  Hemodialysis on left comminuted intertrochanter fracture, chest x-ray showed possible left lower lobe consolidation versus pleural effusion.  Review of Systems: As per HPI otherwise 14 point review of systems negative.    Past Medical History:  Diagnosis Date   High cholesterol    Hypertension    Stroke [facility])    2003   Tachycardia     Past Surgical History:  Procedure Laterality Date   ABDOMINAL HYSTERECTOMY       reports that she has never smoked. She has never used smokeless tobacco. She reports that she does not drink alcohol and does not use drugs.  Allergies  Allergen Reactions   Aspirin Nausea Only    No family history on file.   Prior to Admission medications   Medication Sig Start Date End Date Taking? Authorizing Provider  Calcium Carb-Cholecalciferol (CALCIUM 600 + D PO) Take 2 tablets  by mouth daily.   Yes [provider]  clopidogrel (PLAVIX) 75 MG tablet Take 75 mg by mouth daily with breakfast.   Yes [provider]  ferrous fumarate (HEMOCYTE - 106 MG FE) 325 (106 FE) MG TABS tablet Take 1 tablet by mouth daily.   Yes [provider]  hydrochlorothiazide (HYDRODIURIL) 25 MG tablet  10/02/16  Yes [provider]  levothyroxine (SYNTHROID, LEVOTHROID) 50 MCG tablet Take 50 mcg by mouth daily before breakfast.   Yes [provider]  methylcellulose (ARTIFICIAL TEARS) 1 % ophthalmic solution Place 1 drop into both eyes 2 (two) times daily.   Yes [provider]  Multiple Vitamins-Minerals (MULTIVITAMIN WITH MINERALS) tablet Take 1 tablet by mouth daily.   Yes [provider]  ondansetron (ZOFRAN ODT) 4 MG disintegrating tablet Take 1 tablet (4 mg total) by mouth every 8 (eight) hours as needed for nausea or vomiting. 01/02/14  Yes Carmin Muskrat, MD  propranolol (INDERAL) 10 MG tablet Take 10 mg by mouth 2 (two) times daily.   Yes [provider]  simvastatin (ZOCOR) 10 MG tablet Take 10 mg by mouth every evening.   Yes [provider]  verapamil (VERELAN PM) 120 MG 24 hr capsule Take 120 mg by mouth every morning.   Yes [provider]    Physical Exam: Vitals:   09/24/22 1415 09/24/22 1430 09/24/22 1515 09/24/22 1545  BP: (!) 172/53 (!) 159/44 (!) 157/43 (!) 126/92  Pulse: (!) 32 (!) 32 Marland Kitchen)  29 (!) 41  Resp:   15 17  Temp:      TempSrc:      SpO2: 99% 97% 99% 97%    Constitutional: NAD, calm, comfortable Vitals:   09/24/22 1415 09/24/22 1430 09/24/22 1515 09/24/22 1545  BP: (!) 172/53 (!) 159/44 (!) 157/43 (!) 126/92  Pulse: (!) 32 (!) 32 (!) 29 (!) 41  Resp:   15 17  Temp:      TempSrc:      SpO2: 99% 97% 99% 97%   Eyes: PERRL, lids and conjunctivae normal ENMT: Mucous membranes are moist. Posterior pharynx clear of any exudate or lesions.Normal dentition.  Neck: normal,  supple, no masses, no thyromegaly Respiratory: Diminished breathing sound on left lower field, no wheezing, no crackles. Normal respiratory effort. No accessory muscle use.  Cardiovascular: Regular rate and rhythm, no murmurs / rubs / gallops. No extremity edema. 2+ pedal pulses. No carotid bruits.  Abdomen: no tenderness, no masses palpated. No hepatosplenomegaly. Bowel sounds positive.  Musculoskeletal: no clubbing / cyanosis. No joint deformity upper and lower extremities.  Left leg shortened and rotated, severe tenderness on left hip Neurologic: CN 2-12 grossly intact. Sensation intact, DTR normal. Strength 5/5 in all 4.  Psychiatric: Normal judgment and insight. Alert and oriented x 3. Normal mood.     Labs on Admission: I have personally reviewed following labs and imaging studies  CBC: Recent Labs  Lab 09/24/22 1341  WBC 9.8  HGB 12.8  12.9  HCT 36.8  38.0  MCV 104.0*  PLT 466   Basic Metabolic Panel: Recent Labs  Lab 09/24/22 1341  NA 136  135  K 3.9  3.8  CL 100  96*  CO2 27  GLUCOSE 163*  166*  BUN 9  10  CREATININE 0.80  0.70  CALCIUM 9.5   GFR: CrCl cannot be calculated (Unknown ideal weight.). Liver Function Tests: Recent Labs  Lab 09/24/22 1341  AST 22  ALT 18  ALKPHOS 51  BILITOT 1.0  PROT 6.0*  ALBUMIN 3.8   No results for input(s): "LIPASE", "AMYLASE" in the last 168 hours. No results for input(s): "AMMONIA" in the last 168 hours. Coagulation Profile: Recent Labs  Lab 09/24/22 1341  INR 1.1   Cardiac Enzymes: No results for input(s): "CKTOTAL", "CKMB", "CKMBINDEX", "TROPONINI" in the last 168 hours. BNP (last 3 results) No results for input(s): "PROBNP" in the last 8760 hours. HbA1C: No results for input(s): "HGBA1C" in the last 72 hours. CBG: No results for input(s): "GLUCAP" in the last 168 hours. Lipid Profile: No results for input(s): "CHOL", "HDL", "LDLCALC", "TRIG", "CHOLHDL", "LDLDIRECT" in the last 72 hours. Thyroid  Function Tests: No results for input(s): "TSH", "T4TOTAL", "FREET4", "T3FREE", "THYROIDAB" in the last 72 hours. Anemia Panel: No results for input(s): "VITAMINB12", "FOLATE", "FERRITIN", "TIBC", "IRON", "RETICCTPCT" in the last 72 hours. Urine analysis:    Component Value Date/Time   COLORURINE YELLOW 01/02/2014 1405   APPEARANCEUR CLOUDY (A) 01/02/2014 1405   LABSPEC 1.016 01/02/2014 1405   PHURINE 7.0 01/02/2014 1405   GLUCOSEU NEGATIVE 01/02/2014 1405   HGBUR NEGATIVE 01/02/2014 1405   BILIRUBINUR NEGATIVE 01/02/2014 1405   KETONESUR 15 (A) 01/02/2014 1405   PROTEINUR NEGATIVE 01/02/2014 1405   UROBILINOGEN 1.0 01/02/2014 1405   NITRITE NEGATIVE 01/02/2014 1405   LEUKOCYTESUR TRACE (A) 01/02/2014 1405    Radiological Exams on Admission: CT Hip Left Wo Contrast  Result Date: 09/24/2022 CLINICAL DATA:  Left hip fracture EXAM: CT OF THE LEFT HIP  WITHOUT CONTRAST TECHNIQUE: Multidetector CT imaging of the left hip was performed according to the standard protocol. Multiplanar CT image reconstructions were also generated. RADIATION DOSE REDUCTION: This exam was performed according to the departmental dose-optimization program which includes automated exposure control, adjustment of the mA and/or kV according to patient size and/or use of iterative reconstruction technique. COMPARISON:  09/24/2022 x-ray FINDINGS: Bones/Joint/Cartilage Acute comminuted intertrochanteric fracture of the proximal left femur mildly impacted with mild varus angulation. Lesser trochanteric fragment is slightly displaced medially. Greater trochanteric fragment is also mildly displaced posteriorly. Femoroacetabular joint alignment is maintained without dislocation. 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 Visualized portion of the left hemipelvis is intact. No lytic or sclerotic bone lesions. Ligaments Suboptimally assessed by CT. Muscles and Tendons Posttraumatic bony avulsions of the greater and lesser trochanters.  Otherwise, no acute musculotendinous abnormality. Soft tissues Soft tissue swelling at the fracture site. No inguinal lymphadenopathy. Colonic diverticulosis. Atherosclerotic vascular calcifications. IMPRESSION: Acute comminuted intertrochanteric fracture of the proximal left femur. Electronically Signed   By: Davina Poke D.O.   On: 09/24/2022 14:43   DG FEMUR MIN 2 VIEWS LEFT  Result Date: 09/24/2022 CLINICAL DATA:  Left hip fracture, fall EXAM: LEFT FEMUR 2 VIEWS COMPARISON:  09/24/2022 FINDINGS: There is redemonstration of an acute left hip intertrochanteric fracture with displacement and angulation. Distal aspect of the left femur intact. Visualized pelvis unremarkable. Peripheral vascular calcifications noted. IMPRESSION: Acute left hip intertrochanteric fracture. Electronically Signed   By: Jerilynn Mages.  Shick M.D.   On: 09/24/2022 14:27   DG Pelvis 1-2 Views  Result Date: 09/24/2022 CLINICAL DATA:  Fall, pain EXAM: PELVIS - 1-2 VIEW COMPARISON:  02/10/2022 FINDINGS: There is an acute displaced left hip intertrochanteric fracture. Bones are osteopenic. Bony pelvis and right hip appear intact. Degenerative changes noted of the lumbosacral spine, both SI joints as well as the hips. IMPRESSION: Acute displaced left hip intertrochanteric fracture. Electronically Signed   By: Jerilynn Mages.  Shick M.D.   On: 09/24/2022 14:25   DG Chest 1 View  Result Date: 09/24/2022 CLINICAL DATA:  Fall, pain EXAM: CHEST  1 VIEW COMPARISON:  None Available. FINDINGS: Left lower lung opacity obscures the left hemidiaphragm and left cardiac border concerning for left lower lobe collapse/consolidation as well as associated left effusion. Heart is enlarged. Right lung remains clear. Negative for pneumothorax. No edema pattern. Aorta atherosclerotic. Bones are osteopenic. IMPRESSION: Left lower lung opacity concerning for left lower lobe collapse/consolidation and associated left effusion. Cardiomegaly without CHF pattern Aortic  Atherosclerosis (ICD10-I70.0). Electronically Signed   By: Jerilynn Mages.  Shick M.D.   On: 09/24/2022 14:24   DG Shoulder Left  Result Date: 09/24/2022 CLINICAL DATA:  Fall, pain EXAM: LEFT SHOULDER - 2+ VIEW COMPARISON:  None Available. FINDINGS: Slight degenerative changes of the left shoulder joint and AC joint. Bones are osteopenic. No acute osseous finding, fracture or malalignment. Included chest demonstrates aortic atherosclerosis. IMPRESSION: Osteopenia and degenerative changes. No acute finding by plain radiography. Electronically Signed   By: Jerilynn Mages.  Shick M.D.   On: 09/24/2022 14:22   CT HEAD WO CONTRAST  Result Date: 09/24/2022 CLINICAL DATA:  [place].  Hit head. EXAM: CT HEAD WITHOUT CONTRAST TECHNIQUE: Contiguous axial images were obtained from the base of the skull through the vertex without intravenous contrast. RADIATION DOSE REDUCTION: This exam was performed according to the departmental dose-optimization program which includes automated exposure control, adjustment of the mA and/or kV according to patient size and/or use of iterative reconstruction technique. COMPARISON:  None Available. FINDINGS: Brain:  Age related cerebral atrophy, ventriculomegaly and periventricular white matter disease. Remote infarcts. No extra-axial fluid collections are identified. No CT findings for acute hemispheric infarction or intracranial hemorrhage. No mass lesions. The brainstem and cerebellum are normal. Vascular: Vascular calcifications but no aneurysm or hyperdense vessels. Skull: No acute skull fracture or bone lesions. Sinuses/Orbits: The paranasal sinuses and mastoid air cells are clear. The globes are intact. Other: No scalp lesions or scalp hematoma. IMPRESSION: 1. Age related cerebral atrophy, ventriculomegaly and periventricular white matter disease. 2. Remote cerebral infarctions. 3. No acute intracranial findings or skull fracture. Electronically Signed   By: Marijo Sanes M.D.   On: 09/24/2022 14:07   CT  CERVICAL SPINE WO CONTRAST  Result Date: 09/24/2022 CLINICAL DATA:  Fall EXAM: CT CERVICAL SPINE WITHOUT CONTRAST TECHNIQUE: Multidetector CT imaging of the cervical spine was performed without intravenous contrast. Multiplanar CT image reconstructions were also generated. RADIATION DOSE REDUCTION: This exam was performed according to the departmental dose-optimization program which includes automated exposure control, adjustment of the mA and/or kV according to patient size and/or use of iterative reconstruction technique. COMPARISON:  Cervical radiographs 04/30/2005 FINDINGS: Alignment: Mild anterolisthesis C5-6 Skull base and vertebrae: Negative for cervical spine fracture Soft tissues and spinal canal: Negative for soft tissue mass. Atherosclerotic calcification in the carotid bifurcation bilaterally. Disc levels: Cervical spondylosis, mild for age. Bilateral facet degeneration. Negative for spinal stenosis. Upper chest: Apical pleuroparenchymal scarring bilaterally Other: None IMPRESSION: Cervical spondylosis. Negative for fracture. Electronically Signed   By: Franchot Gallo M.D.   On: 09/24/2022 14:05    EKG: Independently reviewed.  Junctional rhythm, no acute ST changes.  Assessment/Plan Principal Problem:   Hip fracture (HCC) Active Problems:   Hip fracture, unspecified laterality, closed, initial encounter ([place])  (please populate well all problems here in Problem List. (For example, if patient is on BP meds at home and you resume or decide to hold them, it is a problem that needs to be her. Same for CAD, COPD, HLD and so on)  Left femur comminuted intertrochanteric fracture -ORIF in AM. -Pain control with as needed Dilaudid -Consult anesthesiology for local nerve block -30 days per reoperation major cardiac risk 6%, accepted for ORIF acceptable risk.  However, patient extremely bradycardic, which put her at risk at generalized anesthesia.  Discussed with cardiology Dr. Tamala Julian, recommend  hold off both propanolol and verapamil. -Delay chemical DVT prophylaxis for 24 hours.  Bradycardia -Chronic, question of A-fib, will put patient on 24 hours telemetry monitoring to further characterize.  BP/rate control medication management as above. -Echo  Left lung infiltrates/consolidation -No significant respiratory symptoms -Check CT chest to further characterize.  HTN -Hold off verapamil and propanolol, start as needed hydralazine.  Left temple scalp hematoma -Reduced and sutured in ED  DVT prophylaxis: Heparin subcu Code Status: DNR Family Communication: Daughter over the phone Disposition Plan: Patient sick with hip fracture requiring ORIF, expect more than 2 midnight hospital stay Consults called: Orthopedic surgery, curb consult with cardiology, reconsult if necessary Admission status: Telemetry admission   Lequita Halt MD [facility] Pager [phone] 09/24/2022, 4:48 PM

## 2022-09-24 NOTE — ED Provider Notes (Signed)
[facility] EMERGENCY DEPARTMENT Provider Note   CSN: [id number] Arrival date & time: 09/24/22  1313     History  Chief Complaint  Patient presents with   Anna Weiss is a 88 y.o. female.  Patient is a 88 year old female with a past medical history of hypertension, hyperlipidemia, prior stroke without residual deficits on Plavix presenting to the emergency department after a fall.  The patient states that she was ambulating this morning and her cane got caught and caused her to fall and land on her left side.  She states that she hit the left side of her head and her left hip.  She denies any loss of consciousness.  She states she was unable to get herself up and walk after the fall and so he called 911.  She states that in route to the hospital she started to develop nausea and vomiting.  She denies any numbness or weakness.  She denies any chest pain or shortness of breath.  She denies any presyncopal symptoms prior to her fall.  The history is provided by the patient and the EMS personnel.  Fall       Home Medications Prior to Admission medications   Medication Sig Start Date End Date Taking? Authorizing Provider  Calcium Carb-Cholecalciferol (CALCIUM 600 + D PO) Take 2 tablets by mouth daily.   Yes [provider]  clopidogrel (PLAVIX) 75 MG tablet Take 75 mg by mouth daily with breakfast.   Yes [provider]  ferrous fumarate (HEMOCYTE - 106 MG FE) 325 (106 FE) MG TABS tablet Take 1 tablet by mouth daily.   Yes [provider]  hydrochlorothiazide (HYDRODIURIL) 25 MG tablet  10/02/16  Yes [provider]  levothyroxine (SYNTHROID, LEVOTHROID) 50 MCG tablet Take 50 mcg by mouth daily before breakfast.   Yes [provider]  methylcellulose (ARTIFICIAL TEARS) 1 % ophthalmic solution Place 1 drop into both eyes 2 (two) times daily.   Yes [provider]  Multiple Vitamins-Minerals (MULTIVITAMIN  WITH MINERALS) tablet Take 1 tablet by mouth daily.   Yes [provider]  ondansetron (ZOFRAN ODT) 4 MG disintegrating tablet Take 1 tablet (4 mg total) by mouth every 8 (eight) hours as needed for nausea or vomiting. 01/02/14  Yes Carmin Muskrat, MD  propranolol (INDERAL) 10 MG tablet Take 10 mg by mouth 2 (two) times daily.   Yes [provider]  simvastatin (ZOCOR) 10 MG tablet Take 10 mg by mouth every evening.   Yes [provider]  verapamil (VERELAN PM) 120 MG 24 hr capsule Take 120 mg by mouth every morning.   Yes [provider]      Allergies    Aspirin    Review of Systems   Review of Systems  Physical Exam Updated Vital Signs BP (!) 126/92   Pulse (!) 41   Temp 98.4 F (36.9 C) (Axillary)   Resp 17   SpO2 97%  Physical Exam Vitals and nursing note reviewed.  Constitutional:      General: She is not in acute distress.    Appearance: Normal appearance.  HENT:     Head: Normocephalic and atraumatic.     Right Ear: External ear normal.     Ears:     Comments: Auricular hematoma of left ear    Nose: Nose normal.     Mouth/Throat:     Mouth: Mucous membranes are moist.     Pharynx: Oropharynx  is clear.  Eyes:     Extraocular Movements: Extraocular movements intact.     Conjunctiva/sclera: Conjunctivae normal.     Pupils: Pupils are equal, round, and reactive to light.  Neck:     Comments: No midline neck tenderness Cardiovascular:     Rate and Rhythm: Regular rhythm. Bradycardia present.     Pulses: Normal pulses.     Heart sounds: Normal heart sounds.  Pulmonary:     Effort: Pulmonary effort is normal.     Breath sounds: Normal breath sounds.  Abdominal:     General: Abdomen is flat.     Palpations: Abdomen is soft.     Tenderness: There is no abdominal tenderness.  Musculoskeletal:     Comments: Decreased ROM of left hip with tenderness to palpation, left leg shortening No tenderness to palpation of right lower  extremity No tenderness to palpation of bilateral upper extremities No midline back tenderness Pelvis stable, nontender  Skin:    General: Skin is warm and dry.     Findings: Bruising (Left shoulder) present.  Neurological:     General: No focal deficit present.     Mental Status: She is alert and oriented to person, place, and time.     Sensory: No sensory deficit.     Motor: No weakness.  Psychiatric:        Mood and Affect: Mood normal.        Behavior: Behavior normal.     ED Results / Procedures / Treatments   Labs (all labs ordered are listed, but only abnormal results are displayed) Labs Reviewed  COMPREHENSIVE METABOLIC PANEL - Abnormal; Notable for the following components:      Result Value   Glucose, Bld 163 (*)    Total Protein 6.0 (*)    All other components within normal limits  CBC - Abnormal; Notable for the following components:   RBC 3.54 (*)    MCV 104.0 (*)    MCH 36.2 (*)    All other components within normal limits  I-STAT CHEM 8, ED - Abnormal; Notable for the following components:   Chloride 96 (*)    Glucose, Bld 166 (*)    All other components within normal limits  ETHANOL  LACTIC ACID, PLASMA  PROTIME-INR  URINALYSIS, ROUTINE W REFLEX MICROSCOPIC  SAMPLE TO BLOOD BANK    EKG EKG Interpretation  Date/Time:  Wednesday September 24 2022 14:43:50 EDT Ventricular Rate:  31 PR Interval:    QRS Duration: 104 QT Interval:  661 QTC Calculation: 475 R Axis:   75 Text Interpretation: Junctional bradycardia Anteroseptal infarct, age indeterminate Baseline wander in lead(s) V4 V5 No significant change since last tracing Confirmed by Leanord Asal ([id number]) on 09/24/2022 3:14:15 PM  Radiology CT Hip Left Wo Contrast  Result Date: 09/24/2022 CLINICAL DATA:  Left hip fracture EXAM: CT OF THE LEFT HIP WITHOUT CONTRAST TECHNIQUE: Multidetector CT imaging of the left hip was performed according to the standard protocol. Multiplanar CT image  reconstructions were also generated. RADIATION DOSE REDUCTION: This exam was performed according to the departmental dose-optimization program which includes automated exposure control, adjustment of the mA and/or kV according to patient size and/or use of iterative reconstruction technique. COMPARISON:  09/24/2022 x-ray FINDINGS: Bones/Joint/Cartilage Acute comminuted intertrochanteric fracture of the proximal left femur mildly impacted with mild varus angulation. Lesser trochanteric fragment is slightly displaced medially. Greater trochanteric fragment is also mildly displaced posteriorly. Femoroacetabular joint alignment is maintained without dislocation. 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 Visualized portion of the left hemipelvis is intact. No lytic or sclerotic bone lesions. Ligaments Suboptimally assessed by CT. Muscles and Tendons Posttraumatic bony avulsions of the greater and lesser trochanters. Otherwise, no acute musculotendinous abnormality. Soft tissues Soft tissue swelling at the fracture site. No inguinal lymphadenopathy. Colonic diverticulosis. Atherosclerotic vascular calcifications. IMPRESSION: Acute comminuted intertrochanteric fracture of the proximal left femur. Electronically Signed   By: Davina Poke D.O.   On: 09/24/2022 14:43   DG FEMUR MIN 2 VIEWS LEFT  Result Date: 09/24/2022 CLINICAL DATA:  Left hip fracture, fall EXAM: LEFT FEMUR 2 VIEWS COMPARISON:  09/24/2022 FINDINGS: There is redemonstration of an acute left hip intertrochanteric fracture with displacement and angulation. Distal aspect of the left femur intact. Visualized pelvis unremarkable. Peripheral vascular calcifications noted. IMPRESSION: Acute left hip intertrochanteric fracture. Electronically Signed   By: Jerilynn Mages.  Shick M.D.   On: 09/24/2022 14:27   DG Pelvis 1-2 Views  Result Date: 09/24/2022 CLINICAL DATA:  Fall, pain EXAM: PELVIS - 1-2 VIEW COMPARISON:  02/10/2022 FINDINGS: There is an acute displaced left  hip intertrochanteric fracture. Bones are osteopenic. Bony pelvis and right hip appear intact. Degenerative changes noted of the lumbosacral spine, both SI joints as well as the hips. IMPRESSION: Acute displaced left hip intertrochanteric fracture. Electronically Signed   By: Jerilynn Mages.  Shick M.D.   On: 09/24/2022 14:25   DG Chest 1 View  Result Date: 09/24/2022 CLINICAL DATA:  Fall, pain EXAM: CHEST  1 VIEW COMPARISON:  None Available. FINDINGS: Left lower lung opacity obscures the left hemidiaphragm and left cardiac border concerning for left lower lobe collapse/consolidation as well as associated left effusion. Heart is enlarged. Right lung remains clear. Negative for pneumothorax. No edema pattern. Aorta atherosclerotic. Bones are osteopenic. IMPRESSION: Left lower lung opacity concerning for left lower lobe collapse/consolidation and associated left effusion. Cardiomegaly without CHF pattern Aortic Atherosclerosis (ICD10-I70.0). Electronically Signed   By: Jerilynn Mages.  Shick M.D.   On: 09/24/2022 14:24   DG Shoulder Left  Result Date: 09/24/2022 CLINICAL DATA:  Fall, pain EXAM: LEFT SHOULDER - 2+ VIEW COMPARISON:  None Available. FINDINGS: Slight degenerative changes of the left shoulder joint and AC joint. Bones are osteopenic. No acute osseous finding, fracture or malalignment. Included chest demonstrates aortic atherosclerosis. IMPRESSION: Osteopenia and degenerative changes. No acute finding by plain radiography. Electronically Signed   By: Jerilynn Mages.  Shick M.D.   On: 09/24/2022 14:22   CT HEAD WO CONTRAST  Result Date: 09/24/2022 CLINICAL DATA:  [place].  Hit head. EXAM: CT HEAD WITHOUT CONTRAST TECHNIQUE: Contiguous axial images were obtained from the base of the skull through the vertex without intravenous contrast. RADIATION DOSE REDUCTION: This exam was performed according to the departmental dose-optimization program which includes automated exposure control, adjustment of the mA and/or kV according to patient size  and/or use of iterative reconstruction technique. COMPARISON:  None Available. FINDINGS: Brain: Age related cerebral atrophy, ventriculomegaly and periventricular white matter disease. Remote infarcts. No extra-axial fluid collections are identified. No CT findings for acute hemispheric infarction or intracranial hemorrhage. No mass lesions. The brainstem and cerebellum are normal. Vascular: Vascular calcifications but no aneurysm or hyperdense vessels. Skull: No acute skull fracture or bone lesions. Sinuses/Orbits: The paranasal sinuses and mastoid air cells are clear. The globes are intact. Other: No scalp lesions or scalp hematoma. IMPRESSION: 1. Age related cerebral atrophy, ventriculomegaly and periventricular white matter disease. 2. Remote cerebral infarctions. 3. No acute intracranial findings or skull fracture. Electronically Signed   By: Mamie Nick.  Gallerani M.D.   On: 09/24/2022 14:07   CT CERVICAL SPINE WO CONTRAST  Result Date: 09/24/2022 CLINICAL DATA:  Fall EXAM: CT CERVICAL SPINE WITHOUT CONTRAST TECHNIQUE: Multidetector CT imaging of the cervical spine was performed without intravenous contrast. Multiplanar CT image reconstructions were also generated. RADIATION DOSE REDUCTION: This exam was performed according to the departmental dose-optimization program which includes automated exposure control, adjustment of the mA and/or kV according to patient size and/or use of iterative reconstruction technique. COMPARISON:  Cervical radiographs 04/30/2005 FINDINGS: Alignment: Mild anterolisthesis C5-6 Skull base and vertebrae: Negative for cervical spine fracture Soft tissues and spinal canal: Negative for soft tissue mass. Atherosclerotic calcification in the carotid bifurcation bilaterally. Disc levels: Cervical spondylosis, mild for age. Bilateral facet degeneration. Negative for spinal stenosis. Upper chest: Apical pleuroparenchymal scarring bilaterally Other: None IMPRESSION: Cervical spondylosis.  Negative for fracture. Electronically Signed   By: Franchot Gallo M.D.   On: 09/24/2022 14:05    Procedures .Marland KitchenIncision and Drainage  Date/Time: 09/24/2022 4:38 PM  Performed by: Kemper Durie, DO Authorized by: Kemper Durie, DO   Consent:    Consent obtained:  Verbal   Consent given by:  Patient   Risks, benefits, and alternatives were discussed: yes     Risks discussed:  Bleeding, incomplete drainage and pain (deformity)   Alternatives discussed:  No treatment and delayed treatment Universal protocol:    Procedure explained and questions answered to patient or proxy's satisfaction: yes     Patient identity confirmed:  Verbally with patient Location:    Indications for incision and drainage: auricular hematoma.   Size:  2 cm   Location: left ear. Pre-procedure details:    Skin preparation:  Chlorhexidine with alcohol Sedation:    Sedation type:  None Anesthesia:    Anesthesia method:  Nerve block   Block location:  Left ear   Block needle gauge:  27 G   Block anesthetic:  Lidocaine 1% w/o epi   Block technique:  Field block   Block injection procedure:  Anatomic landmarks identified, introduced needle, anatomic landmarks palpated, negative aspiration for blood and incremental injection   Block outcome:  Anesthesia achieved Procedure type:    Complexity:  Simple Procedure details:    Ultrasound guidance: no     Needle aspiration: yes     Needle size:  18 G   Wound management:  Probed and deloculated   Drainage:  Bloody   Drainage amount:  Scant   Wound treatment:  Wound left open   Packing material: Xeroform gauze pressure dressing. Post-procedure details:    Procedure completion:  Tolerated well, no immediate complications     Medications Ordered in ED Medications  ondansetron (ZOFRAN) injection 4 mg (4 mg Intravenous Given 09/24/22 1345)  lidocaine (PF) (XYLOCAINE) 1 % injection 5 mL (5 mLs Infiltration Given by Other 09/24/22 1617)  morphine (PF) 4  MG/ML injection 4 mg (4 mg Intravenous Given 09/24/22 1630)    ED Course/ Medical Decision Making/ A&P Clinical Course as of 09/24/22 1642  Wed Sep 24, 2022  1455 I spoke with Dr. Percell Miller of orthopedics who recommends admission to medicine for OR tomorrow. [VK]  2841 Patient had auricular hematoma drained and dressed by myself, please see procedure note.  She is recommended to follow-up with ENT for 1 week for dressing removal and reassessment.  Patient was signed out to hospitalist for admission [VK]    Clinical Course User Index [VK] Kemper Durie, DO  Medical Decision Making This patient presents to the ED with chief complaint(s) of fall with pertinent past medical history of HTN, prior CVA on plavix which further complicates the presenting complaint. The complaint involves an extensive differential diagnosis and also carries with it a high risk of complications and morbidity.    The differential diagnosis includes ICH, mass effect, cervical spine fracture, shoulder fracture, hip fracture, she had no presyncopal symptoms making a syncopal fall unlikely  Additional history obtained: Additional history obtained from EMS  Records reviewed Primary Care Documents  ED Course and Reassessment: Patient's arrival to the emergency department she is nauseous and actively vomiting but otherwise in no acute distress.  Due to her fall on thinners she was made a level 2 trauma.  Primary survey was intact.  He was significant for a left auricular hematoma, left shoulder contusion and left hip pain with shortening of the left leg.  Independent labs interpretation:  The following labs were independently interpreted: Within normal range  Independent visualization of imaging: - I independently visualized the following imaging with scope of interpretation limited to determining acute life threatening conditions related to emergency care: CT head/C-spine, left hip x-ray, chest  x-ray, left shoulder x-ray, which revealed left intertrochanteric hip fracture  Consultation: - Consulted or discussed management/test interpretation w/ external professional: Orthopedics, hospitalist  Consideration for admission or further workup: Requires admission for further management of her hip fracture Social Determinants of health: N/A    Amount and/or Complexity of Data Reviewed Labs: ordered. Radiology: ordered. ECG/medicine tests: ordered.  Risk Prescription drug management. Decision regarding hospitalization.           Final Clinical Impression(s) / ED Diagnoses Final diagnoses:  Closed fracture of left hip, initial encounter [facility])  Fall, initial encounter    Rx / DC Orders ED Discharge Orders     None         Kemper Durie, DO 09/24/22 1642

## 2022-09-24 NOTE — Progress Notes (Signed)
   09/24/22 2325  Assess: MEWS Score  BP (!) 168/46  MAP (mmHg) 81  Pulse Rate (!) 43  ECG Heart Rate (!) 43  Resp 15  Level of Consciousness Alert  SpO2 95 %  O2 Device Nasal Cannula  O2 Flow Rate (L/min) 2 L/min  Assess: MEWS Score  MEWS Temp 0  MEWS Systolic 0  MEWS Pulse 1  MEWS RR 0  MEWS LOC 0  MEWS Score 1  MEWS Score Color Green  Assess: if the MEWS score is Yellow or Red  Were vital signs taken at a resting state? Yes  Focused Assessment No change from prior assessment  Does the patient meet 2 or more of the SIRS criteria? No  Treat  Pain Scale 0-10  Pain Score Asleep  Take Vital Signs  Increase Vital Sign Frequency  Yellow: Q 2hr X 2 then Q 4hr X 2, if remains yellow, continue Q 4hrs  Document  Patient Outcome Stabilized after interventions  Progress note created (see row info) Yes  Assess: SIRS CRITERIA  SIRS Temperature  0  SIRS Pulse 0  SIRS Respirations  0  SIRS WBC 1  SIRS Score Sum  1   Pt came into ER in these parameters- with exception of 02 sats- required oxygen at 2L [place] to support sats >90%

## 2022-09-24 NOTE — ED Notes (Signed)
Transport in dept. At this time.

## 2022-09-24 NOTE — ED Notes (Signed)
Pt vomited x1 with c/o nausea.

## 2022-09-24 NOTE — ED Notes (Signed)
Dr. Laurance Flatten at bedside. Called pt daughter and placed [place] on speaker phone.

## 2022-09-24 NOTE — Consult Note (Addendum)
Orthopedic Surgery Consult Note  Assessment: Patient is a 88 y.o. female with left intertrochanteric femur fracture   Plan: -Operative plans: tentatively planning OR tomorrow evening pending medical optimization -Has osteoporosis, patient will talk to her primary care provider about medical management in the outpatient setting -Diet: NPO at midnight -DVT ppx: heparin -Antibiotics: ancef on call to OR -Weight bearing status: NWB LLE -PT/OT evaluate and treat post-operatively -Pain control -Dispo: admit to medicine   Discussed recommendation for operative intervention in the form of left hip intramedullary nailing. Explained the risks of this procedure included, but were not limited to: nonunion, malunion, hardware failure, infection, bleeding, need for transfusion, stiffness, need for additional procedures, deep vein thrombosis, pulmonary embolism, and death. The benefits of this procedure would be to promote fracture healing by giving the fracture stability, to heal the fracture in the appropriate alignment, and allow for mobilization after surgery. The alternatives of this surgery would be to treat the fracture with immobilization or bedrest. The patient and daughter's [name]) questions were answered to their satisfaction. After this discussion, patient elected to proceed with surgery. Informed consent was obtained.   _________________________________________________________________________  History:  Patient is a 88 y.o. female who had a ground level fall earlier today Pain location: left hip Severity of pain: severe initially, better controlled with medication Mechanism of injury: fall Duration of symptoms: 1 day Paresthesias and numbness: denies  Past Medical History:  Diagnosis Date   High cholesterol    Hypertension    Stroke ([place])    2003   Tachycardia    Osteoporosis  PSHx: Hysterectomy  Denies smoking, EtOH use, and recreational drug use  Family history: -not  pertinent to left hip fracture   Physical Exam:  General: no acute distress, appears stated age Neurologic: alert, answering questions appropriately, following commands Cardiovascular: slow rate, no cyanosis Respiratory: unlabored breathing on room air, symmetric chest rise Psychiatric: appropriate affect, normal cadence to speech  MSK:   -Bilateral upper extremities  No tenderness to palpation over extremity, no gross deformity Fires deltoid, biceps, triceps, wrist extensors, wrist flexors, finger extensors, finger flexors  Palpable radial pulse  Sensation intact to light touch in median/ulnar/radial/axillary nerve distributions  Hand warm and well perfused  -Left lower extremity  No tenderness to palpation over extremity, except left hip and thigh  Leg short compared to contralateral side Fires quadriceps, hamstrings, tibialis anterior, gastrocnemius and soleus, extensor hallucis longus Plantarflexes and dorsiflexes toes Patient having pain and would not fire hip flexors Sensation intact to light touch in sural, saphenous, tibial, deep peroneal, and superficial peroneal nerve distributions Foot warm and well perfused  -Right lower extremity  No tenderness to palpation over extremity, no gross deformity Fires hip flexors, quadriceps, hamstrings, tibialis anterior, gastrocnemius and soleus, extensor hallucis longus Plantarflexes and dorsiflexes toes Sensation intact to light touch in sural, saphenous, tibial, deep peroneal, and superficial peroneal nerve distributions Foot warm and well perfused  Imaging: XR of the left femur from 09/24/2022 was independently reviewed and interpreted, showing comminuted, intertrochanteric femur fracture  CT of the left hip from 09/24/2022 shows a left comminuted intertrochanteric femur fracture   Patient name: Anna Weiss Patient MRN: [id number] Date: 09/24/22

## 2022-09-24 NOTE — ED Notes (Signed)
Pt transported on monitoring to CT at this time.

## 2022-09-24 NOTE — Progress Notes (Signed)
Initial visit with Anna Weiss and her daughter at the pt's bedside. Anna Weiss shared that she thinks she got her cane caught in something, which caused her to fall. She reports she is in a lot of pain and just learned that she will be admitted for surgery tomorrow.  Anna Weiss's daughter is at her bedside and expressed some frustration that her mother's surgery is currently scheduled with Dr Percell Miller. Anna Weiss is currently a patient of Dr Lorin Mercy and they would like to remain with the practice that already knows her.   Anna Weiss had a fall in March, which also resulted in a hospitalization. The fall took place the day after her husband died and the family reports that they can't even remember his memorial service because that experience was so stressful and traumatic.   Anna Weiss requested assistance in adjusting her leg to a more comfortable position. I informed her that I am not able to assist her in this way, but passed the request along to the medical team.  Please page as further needs arise.  Donald Prose. Elyn Peers, M.Div. [facility] Chaplain Pager [phone] Office [phone]

## 2022-09-24 NOTE — Progress Notes (Signed)
Trauma Response Nurse Documentation   Anna Weiss is a 88 y.o. female arriving to [facility] ED via EMS  On clopidogrel 75 mg daily. Trauma was activated as a Level 2 by ED Charge nurse based on the following trauma criteria Elderly patients > 65 with head trauma on anti-coagulation (excluding ASA). Trauma team at the bedside on patient arrival.   Patient cleared for CT by Dr. Maylon Peppers . Pt transported to CT and X-ray with trauma response nurse present to monitor. RN remained with the patient throughout their absence from the department for clinical observation.   GCS 15.  History   Past Medical History:  Diagnosis Date   High cholesterol    Hypertension    Stroke [facility])    2003   Tachycardia      Past Surgical History:  Procedure Laterality Date   ABDOMINAL HYSTERECTOMY         Initial Focused Assessment (If applicable, or please see trauma documentation): Pt alert and oriented. Airway intact. Asymptomatic bradycardia in the 30s. Noted to have bruising to left shoulder and left hip, with leg shortening and external rotation. Motor and sensory intact.  CT's Completed:   CT Head and CT C-Spine   Interventions:  Trauma labs drawn. Pt transported to CT and X-ray by TRN. Anti-emetic given for n/v.   Plan for disposition:  OR with Admission to floor   Consults completed:  Orthopaedic Surgeon.  Event Summary:   Bedside handoff with ED RN Precious Bard.    Eryck Negron L Cheick Suhr  Trauma Response RN  Please call TRN at [phone] for further assistance.

## 2022-09-24 NOTE — Progress Notes (Signed)
   09/24/22 2025  Assess: MEWS Score  Temp 98.4 F (36.9 C)  BP (!) 160/57  MAP (mmHg) 87  Pulse Rate (!) 39  ECG Heart Rate (!) 43  Resp 13  Level of Consciousness Alert  SpO2 95 %  Assess: MEWS Score  MEWS Temp 0  MEWS Systolic 0  MEWS Pulse 1  MEWS RR 1  MEWS LOC 0  MEWS Score 2  MEWS Score Color Yellow  Assess: if the MEWS score is Yellow or Red  Were vital signs taken at a resting state? Yes  Focused Assessment No change from prior assessment  Does the patient meet 2 or more of the SIRS criteria? No  MEWS guidelines implemented *See Row Information* Yes  Treat  MEWS Interventions Administered prn meds/treatments;Administered scheduled meds/treatments  Pain Scale 0-10  Pain Score 10  Pain Intervention(s) Medication (See eMAR)  Take Vital Signs  Increase Vital Sign Frequency  Yellow: Q 2hr X 2 then Q 4hr X 2, if remains yellow, continue Q 4hrs  Document  Patient Outcome Stabilized after interventions  Progress note created (see row info) Yes  Assess: SIRS CRITERIA  SIRS Temperature  0  SIRS Pulse 0  SIRS Respirations  0  SIRS WBC 1  SIRS Score Sum  1

## 2022-09-24 NOTE — Consult Note (Signed)
ORTHOPAEDIC CONSULTATION  REQUESTING PHYSICIAN: Lequita Halt, MD  Chief Complaint: left hip pain  HPI: Anna Weiss is a 88 y.o. female who complains of  left hip pain after falling at home. She was using her cane and got tripped up causing the fall. She had immediate severe pain in the left hip and inability to bear weight on the left leg.   Imaging shows an acute comminuted intertrochanteric fracture of the proximal left femur mildly impacted with mild varus angulation. Lesser trochanteric fragment is slightly displaced medially. Greater trochanteric fragment is also mildly displaced posteriorly.    Orthopedics was consulted for evaluation.   No history of MI, DVT, PE. + h/o CVA in 2003 and on Plavix. Previously ambulatory with the use of assistive device (cane).  The patient is living alone.    Past Medical History:  Diagnosis Date   High cholesterol    Hypertension    Stroke [facility])    2003   Tachycardia    Past Surgical History:  Procedure Laterality Date   ABDOMINAL HYSTERECTOMY     Social History   Socioeconomic History   Marital status: Widowed    Spouse name: Not on file   Number of children: Not on file   Years of education: Not on file   Highest education level: Not on file  Occupational History   Not on file  Tobacco Use   Smoking status: Never   Smokeless tobacco: Never  Substance and Sexual Activity   Alcohol use: No   Drug use: No   Sexual activity: Not on file  Other Topics Concern   Not on file  Social History Narrative   Not on file   Social Determinants of Health   Financial Resource Strain: Not on file  Food Insecurity: Not on file  Transportation Needs: Not on file  Physical Activity: Not on file  Stress: Not on file  Social Connections: Not on file   No family history on file. Allergies  Allergen Reactions   Aspirin Nausea Only   Prior to Admission medications   Medication Sig Start Date End Date Taking? Authorizing  Provider  Calcium Carb-Cholecalciferol (CALCIUM 600 + D PO) Take 2 tablets by mouth daily.   Yes [provider]  clopidogrel (PLAVIX) 75 MG tablet Take 75 mg by mouth daily with breakfast.   Yes [provider]  ferrous fumarate (HEMOCYTE - 106 MG FE) 325 (106 FE) MG TABS tablet Take 1 tablet by mouth daily.   Yes [provider]  hydrochlorothiazide (HYDRODIURIL) 25 MG tablet  10/02/16  Yes [provider]  levothyroxine (SYNTHROID, LEVOTHROID) 50 MCG tablet Take 50 mcg by mouth daily before breakfast.   Yes [provider]  methylcellulose (ARTIFICIAL TEARS) 1 % ophthalmic solution Place 1 drop into both eyes 2 (two) times daily.   Yes [provider]  Multiple Vitamins-Minerals (MULTIVITAMIN WITH MINERALS) tablet Take 1 tablet by mouth daily.   Yes [provider]  ondansetron (ZOFRAN ODT) 4 MG disintegrating tablet Take 1 tablet (4 mg total) by mouth every 8 (eight) hours as needed for nausea or vomiting. 01/02/14  Yes Carmin Muskrat, MD  propranolol (INDERAL) 10 MG tablet Take 10 mg by mouth 2 (two) times daily.   Yes [provider]  simvastatin (ZOCOR) 10 MG tablet Take 10 mg by mouth every evening.   Yes [provider]  verapamil (VERELAN PM) 120 MG 24 hr capsule Take 120 mg by mouth  every morning.   Yes [provider]   CT Hip Left Wo Contrast  Result Date: 09/24/2022 CLINICAL DATA:  Left hip fracture EXAM: CT OF THE LEFT HIP WITHOUT CONTRAST TECHNIQUE: Multidetector CT imaging of the left hip was performed according to the standard protocol. Multiplanar CT image reconstructions were also generated. RADIATION DOSE REDUCTION: This exam was performed according to the departmental dose-optimization program which includes automated exposure control, adjustment of the mA and/or kV according to patient size and/or use of iterative reconstruction technique. COMPARISON:  09/24/2022 x-ray FINDINGS:  Bones/Joint/Cartilage Acute comminuted intertrochanteric fracture of the proximal left femur mildly impacted with mild varus angulation. Lesser trochanteric fragment is slightly displaced medially. Greater trochanteric fragment is also mildly displaced posteriorly. Femoroacetabular joint alignment is maintained without dislocation. 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 Visualized portion of the left hemipelvis is intact. No lytic or sclerotic bone lesions. Ligaments Suboptimally assessed by CT. Muscles and Tendons Posttraumatic bony avulsions of the greater and lesser trochanters. Otherwise, no acute musculotendinous abnormality. Soft tissues Soft tissue swelling at the fracture site. No inguinal lymphadenopathy. Colonic diverticulosis. Atherosclerotic vascular calcifications. IMPRESSION: Acute comminuted intertrochanteric fracture of the proximal left femur. Electronically Signed   By: Davina Poke D.O.   On: 09/24/2022 14:43   DG FEMUR MIN 2 VIEWS LEFT  Result Date: 09/24/2022 CLINICAL DATA:  Left hip fracture, fall EXAM: LEFT FEMUR 2 VIEWS COMPARISON:  09/24/2022 FINDINGS: There is redemonstration of an acute left hip intertrochanteric fracture with displacement and angulation. Distal aspect of the left femur intact. Visualized pelvis unremarkable. Peripheral vascular calcifications noted. IMPRESSION: Acute left hip intertrochanteric fracture. Electronically Signed   By: Jerilynn Mages.  Shick M.D.   On: 09/24/2022 14:27   DG Pelvis 1-2 Views  Result Date: 09/24/2022 CLINICAL DATA:  Fall, pain EXAM: PELVIS - 1-2 VIEW COMPARISON:  02/10/2022 FINDINGS: There is an acute displaced left hip intertrochanteric fracture. Bones are osteopenic. Bony pelvis and right hip appear intact. Degenerative changes noted of the lumbosacral spine, both SI joints as well as the hips. IMPRESSION: Acute displaced left hip intertrochanteric fracture. Electronically Signed   By: Jerilynn Mages.  Shick M.D.   On: 09/24/2022 14:25   DG Chest 1  View  Result Date: 09/24/2022 CLINICAL DATA:  Fall, pain EXAM: CHEST  1 VIEW COMPARISON:  None Available. FINDINGS: Left lower lung opacity obscures the left hemidiaphragm and left cardiac border concerning for left lower lobe collapse/consolidation as well as associated left effusion. Heart is enlarged. Right lung remains clear. Negative for pneumothorax. No edema pattern. Aorta atherosclerotic. Bones are osteopenic. IMPRESSION: Left lower lung opacity concerning for left lower lobe collapse/consolidation and associated left effusion. Cardiomegaly without CHF pattern Aortic Atherosclerosis (ICD10-I70.0). Electronically Signed   By: Jerilynn Mages.  Shick M.D.   On: 09/24/2022 14:24   DG Shoulder Left  Result Date: 09/24/2022 CLINICAL DATA:  Fall, pain EXAM: LEFT SHOULDER - 2+ VIEW COMPARISON:  None Available. FINDINGS: Slight degenerative changes of the left shoulder joint and AC joint. Bones are osteopenic. No acute osseous finding, fracture or malalignment. Included chest demonstrates aortic atherosclerosis. IMPRESSION: Osteopenia and degenerative changes. No acute finding by plain radiography. Electronically Signed   By: Jerilynn Mages.  Shick M.D.   On: 09/24/2022 14:22   CT HEAD WO CONTRAST  Result Date: 09/24/2022 CLINICAL DATA:  [place].  Hit head. EXAM: CT HEAD WITHOUT CONTRAST TECHNIQUE: Contiguous axial images were obtained from the base of the skull through the vertex without intravenous contrast. RADIATION DOSE REDUCTION: This exam was performed according to  the departmental dose-optimization program which includes automated exposure control, adjustment of the mA and/or kV according to patient size and/or use of iterative reconstruction technique. COMPARISON:  None Available. FINDINGS: Brain: Age related cerebral atrophy, ventriculomegaly and periventricular white matter disease. Remote infarcts. No extra-axial fluid collections are identified. No CT findings for acute hemispheric infarction or intracranial hemorrhage. No  mass lesions. The brainstem and cerebellum are normal. Vascular: Vascular calcifications but no aneurysm or hyperdense vessels. Skull: No acute skull fracture or bone lesions. Sinuses/Orbits: The paranasal sinuses and mastoid air cells are clear. The globes are intact. Other: No scalp lesions or scalp hematoma. IMPRESSION: 1. Age related cerebral atrophy, ventriculomegaly and periventricular white matter disease. 2. Remote cerebral infarctions. 3. No acute intracranial findings or skull fracture. Electronically Signed   By: Marijo Sanes M.D.   On: 09/24/2022 14:07   CT CERVICAL SPINE WO CONTRAST  Result Date: 09/24/2022 CLINICAL DATA:  Fall EXAM: CT CERVICAL SPINE WITHOUT CONTRAST TECHNIQUE: Multidetector CT imaging of the cervical spine was performed without intravenous contrast. Multiplanar CT image reconstructions were also generated. RADIATION DOSE REDUCTION: This exam was performed according to the departmental dose-optimization program which includes automated exposure control, adjustment of the mA and/or kV according to patient size and/or use of iterative reconstruction technique. COMPARISON:  Cervical radiographs 04/30/2005 FINDINGS: Alignment: Mild anterolisthesis C5-6 Skull base and vertebrae: Negative for cervical spine fracture Soft tissues and spinal canal: Negative for soft tissue mass. Atherosclerotic calcification in the carotid bifurcation bilaterally. Disc levels: Cervical spondylosis, mild for age. Bilateral facet degeneration. Negative for spinal stenosis. Upper chest: Apical pleuroparenchymal scarring bilaterally Other: None IMPRESSION: Cervical spondylosis. Negative for fracture. Electronically Signed   By: Franchot Gallo M.D.   On: 09/24/2022 14:05    Positive ROS: All other systems have been reviewed and were otherwise negative with the exception of those mentioned in the HPI and as above.  Objective: Labs cbc Recent Labs    09/24/22 1341  WBC 9.8  HGB 12.8  12.9  HCT 36.8   38.0  PLT 213    Labs inflam No results for input(s): "CRP" in the last 72 hours.  Invalid input(s): "ESR"  Labs coag Recent Labs    09/24/22 1341  INR 1.1    Recent Labs    09/24/22 1341  NA 136  135  K 3.9  3.8  CL 100  96*  CO2 27  GLUCOSE 163*  166*  BUN 9  10  CREATININE 0.80  0.70  CALCIUM 9.5    Physical Exam: Vitals:   09/24/22 1515 09/24/22 1545  BP: (!) 157/43 (!) 126/92  Pulse: (!) 29 (!) 41  Resp: 15 17  Temp:    SpO2: 99% 97%   General: Alert, no acute distress.  Laying in bed, obvious discomfort Mental status: Alert and Oriented x3 Neurologic: Speech Clear and organized, no gross focal findings or movement disorder appreciated. Respiratory: No cyanosis, no use of accessory musculature Cardiovascular: No pedal edema GI: Abdomen is soft and non-tender, non-distended. Skin: Warm and dry.  Extremities: Warm and well perfused w/o edema Psychiatric: Patient is competent for consent with normal mood and affect  MUSCULOSKELETAL:  TTP left hip, ROM not tested in setting of known fracture, ROM ankle intact, NVI  Other extremities are atraumatic with painless ROM and NVI.  Assessment / Plan: Principal Problem:   Hip fracture (HCC) Active Problems:   Hip fracture, unspecified laterality, closed, initial encounter [facility])     Was notified that this  patient is a previous/current patient of Dr. Lorin Mercy and wanted him or one of his practice partners to do her surgery instead of Dr. Percell Miller. We reached out to the on call provider for their practice who agreed to see her. Unless he tells [place] otherwise, he will take over her care.      Britt Bottom PA-C Office [phone] 09/24/2022 5:03 PM

## 2022-09-24 NOTE — ED Notes (Signed)
Called [name] at this time.

## 2022-09-24 NOTE — Progress Notes (Signed)
Orthopedic Tech Progress Note Patient Details:  Anna Weiss 1925-01-07 [id number] Level 2 Trauma  Patient ID: Anna Weiss, female   DOB: February 07, 1925, 88 y.o.   MRN: [id number]  [name] 09/24/2022, 1:47 PM

## 2022-09-25 ENCOUNTER — Encounter (HOSPITAL_COMMUNITY): Payer: Self-pay | Admitting: Internal Medicine

## 2022-09-25 ENCOUNTER — Encounter (HOSPITAL_COMMUNITY)
Admission: EM | Disposition: A | Discharge status: Rehabilitation Facility | Payer: Self-pay | Source: Home / Self Care | Attending: Internal Medicine

## 2022-09-25 ENCOUNTER — Inpatient Hospital Stay (HOSPITAL_COMMUNITY): Payer: Medicare Other

## 2022-09-25 ENCOUNTER — Other Ambulatory Visit: Payer: Self-pay

## 2022-09-25 ENCOUNTER — Inpatient Hospital Stay (HOSPITAL_COMMUNITY): Payer: Medicare Other | Admitting: Certified Registered"

## 2022-09-25 DIAGNOSIS — S72002A Fracture of unspecified part of neck of left femur, initial encounter for closed fracture: Secondary | ICD-10-CM | POA: Diagnosis not present

## 2022-09-25 DIAGNOSIS — S72142A Displaced intertrochanteric fracture of left femur, initial encounter for closed fracture: Principal | ICD-10-CM

## 2022-09-25 DIAGNOSIS — I10 Essential (primary) hypertension: Secondary | ICD-10-CM

## 2022-09-25 DIAGNOSIS — R9431 Abnormal electrocardiogram [ECG] [EKG]: Secondary | ICD-10-CM | POA: Diagnosis not present

## 2022-09-25 DIAGNOSIS — R911 Solitary pulmonary nodule: Secondary | ICD-10-CM

## 2022-09-25 DIAGNOSIS — S0003XA Contusion of scalp, initial encounter: Secondary | ICD-10-CM

## 2022-09-25 DIAGNOSIS — R001 Bradycardia, unspecified: Secondary | ICD-10-CM | POA: Diagnosis not present

## 2022-09-25 DIAGNOSIS — W19XXXA Unspecified fall, initial encounter: Secondary | ICD-10-CM

## 2022-09-25 DIAGNOSIS — Z8673 Personal history of transient ischemic attack (TIA), and cerebral infarction without residual deficits: Secondary | ICD-10-CM

## 2022-09-25 DIAGNOSIS — J181 Lobar pneumonia, unspecified organism: Secondary | ICD-10-CM | POA: Insufficient documentation

## 2022-09-25 HISTORY — PX: INTRAMEDULLARY (IM) NAIL INTERTROCHANTERIC: SHX5875

## 2022-09-25 LAB — MRSA NEXT GEN BY PCR, NASAL: MRSA by PCR Next Gen: NOT DETECTED

## 2022-09-25 LAB — ECHOCARDIOGRAM COMPLETE
AR max vel: 1.02 cm2
AV Area VTI: 1.08 cm2
AV Area mean vel: 1.01 cm2
AV Mean grad: 7.3 mmHg
AV Peak grad: 13.2 mmHg
Ao pk vel: 1.82 m/s
Area-P 1/2: 4.24 cm2
S' Lateral: 3.5 cm

## 2022-09-25 SURGERY — FIXATION, FRACTURE, INTERTROCHANTERIC, WITH INTRAMEDULLARY ROD
Anesthesia: General | Laterality: Left

## 2022-09-25 MED ORDER — SODIUM CHLORIDE 0.9 % IV SOLN
500.0000 mg | Freq: Every day | INTRAVENOUS | Status: DC
  Administered 2022-09-25: 500 mg via INTRAVENOUS
  Filled 2022-09-25 (×2): qty 5

## 2022-09-25 MED ORDER — ACETAMINOPHEN 10 MG/ML IV SOLN
INTRAVENOUS | Status: AC
  Filled 2022-09-25: qty 100

## 2022-09-25 MED ORDER — PROPOFOL 10 MG/ML IV BOLUS
INTRAVENOUS | Status: AC
  Filled 2022-09-25: qty 20

## 2022-09-25 MED ORDER — ENSURE ENLIVE PO LIQD
237.0000 mL | Freq: Two times a day (BID) | ORAL | Status: DC
  Administered 2022-09-26 – 2022-09-28 (×3): 237 mL via ORAL

## 2022-09-25 MED ORDER — ALBUMIN HUMAN 5 % IV SOLN: INTRAVENOUS | Status: DC | PRN

## 2022-09-25 MED ORDER — ACETAMINOPHEN 10 MG/ML IV SOLN
1000.0000 mg | Freq: Once | INTRAVENOUS | Status: DC | PRN
  Administered 2022-09-25: 1000 mg via INTRAVENOUS

## 2022-09-25 MED ORDER — EPHEDRINE SULFATE (PRESSORS) 50 MG/ML IJ SOLN
INTRAMUSCULAR | Status: DC | PRN
  Administered 2022-09-25 (×4): 2.5 mg via INTRAVENOUS

## 2022-09-25 MED ORDER — DEXAMETHASONE SODIUM PHOSPHATE 10 MG/ML IJ SOLN
INTRAMUSCULAR | Status: DC | PRN
  Administered 2022-09-25: 10 mg via INTRAVENOUS

## 2022-09-25 MED ORDER — CHLORHEXIDINE GLUCONATE 0.12 % MT SOLN: 15.0000 mL | Freq: Once | OROMUCOSAL | Status: DC

## 2022-09-25 MED ORDER — ONDANSETRON HCL 4 MG/2ML IJ SOLN
INTRAMUSCULAR | Status: DC | PRN
  Administered 2022-09-25: 4 mg via INTRAVENOUS

## 2022-09-25 MED ORDER — FENTANYL CITRATE (PF) 250 MCG/5ML IJ SOLN
INTRAMUSCULAR | Status: DC | PRN
  Administered 2022-09-25 (×4): 25 ug via INTRAVENOUS

## 2022-09-25 MED ORDER — PROPOFOL 10 MG/ML IV BOLUS
INTRAVENOUS | Status: DC | PRN
  Administered 2022-09-25: 70 mg via INTRAVENOUS

## 2022-09-25 MED ORDER — FENTANYL CITRATE (PF) 100 MCG/2ML IJ SOLN
25.0000 ug | INTRAMUSCULAR | Status: DC | PRN
  Administered 2022-09-25: 25 ug via INTRAVENOUS
  Administered 2022-09-25 (×2): 50 ug via INTRAVENOUS
  Administered 2022-09-25: 25 ug via INTRAVENOUS

## 2022-09-25 MED ORDER — LACTATED RINGERS IV SOLN: INTRAVENOUS | Status: DC

## 2022-09-25 MED ORDER — BUPIVACAINE-EPINEPHRINE (PF) 0.5% -1:200000 IJ SOLN
INTRAMUSCULAR | Status: AC
  Filled 2022-09-25: qty 30

## 2022-09-25 MED ORDER — CHLORHEXIDINE GLUCONATE 0.12 % MT SOLN
OROMUCOSAL | Status: AC
  Filled 2022-09-25: qty 15

## 2022-09-25 MED ORDER — 0.9 % SODIUM CHLORIDE (POUR BTL) OPTIME
TOPICAL | Status: DC | PRN
  Administered 2022-09-25: 1000 mL

## 2022-09-25 MED ORDER — FENTANYL CITRATE (PF) 100 MCG/2ML IJ SOLN
INTRAMUSCULAR | Status: AC
  Filled 2022-09-25: qty 2

## 2022-09-25 MED ORDER — POLYETHYLENE GLYCOL 3350 17 G PO PACK
17.0000 g | PACK | Freq: Two times a day (BID) | ORAL | Status: DC
  Filled 2022-09-25: qty 1

## 2022-09-25 MED ORDER — ADULT MULTIVITAMIN W/MINERALS CH
1.0000 | ORAL_TABLET | Freq: Every day | ORAL | Status: DC
  Administered 2022-09-26 – 2022-09-28 (×3): 1 via ORAL
  Filled 2022-09-25 (×3): qty 1

## 2022-09-25 MED ORDER — TRANEXAMIC ACID-NACL 1000-0.7 MG/100ML-% IV SOLN
1000.0000 mg | INTRAVENOUS | Status: AC
  Administered 2022-09-25: 1000 mg via INTRAVENOUS
  Filled 2022-09-25: qty 100

## 2022-09-25 MED ORDER — SUGAMMADEX SODIUM 200 MG/2ML IV SOLN
INTRAVENOUS | Status: DC | PRN
  Administered 2022-09-25: 200 mg via INTRAVENOUS

## 2022-09-25 MED ORDER — ONDANSETRON HCL 4 MG/2ML IJ SOLN
INTRAMUSCULAR | Status: AC
  Filled 2022-09-25: qty 2

## 2022-09-25 MED ORDER — ORAL CARE MOUTH RINSE: 15.0000 mL | Freq: Once | OROMUCOSAL | Status: DC

## 2022-09-25 MED ORDER — PERFLUTREN LIPID MICROSPHERE
1.0000 mL | INTRAVENOUS | Status: AC | PRN
  Administered 2022-09-25: 4 mL via INTRAVENOUS

## 2022-09-25 MED ORDER — SODIUM CHLORIDE 0.9 % IV SOLN
2.0000 g | Freq: Every day | INTRAVENOUS | Status: DC
  Administered 2022-09-25: 2 g via INTRAVENOUS
  Filled 2022-09-25: qty 20

## 2022-09-25 MED ORDER — ONDANSETRON HCL 4 MG/2ML IJ SOLN
4.0000 mg | Freq: Once | INTRAMUSCULAR | Status: AC
  Administered 2022-09-25: 4 mg via INTRAVENOUS
  Filled 2022-09-25: qty 2

## 2022-09-25 MED ORDER — ONDANSETRON HCL 4 MG/2ML IJ SOLN: 4.0000 mg | Freq: Once | INTRAMUSCULAR | Status: DC | PRN

## 2022-09-25 MED ORDER — ROCURONIUM BROMIDE 10 MG/ML (PF) SYRINGE
PREFILLED_SYRINGE | INTRAVENOUS | Status: DC | PRN
  Administered 2022-09-25: 10 mg via INTRAVENOUS
  Administered 2022-09-25: 60 mg via INTRAVENOUS

## 2022-09-25 MED ORDER — LIDOCAINE 2% (20 MG/ML) 5 ML SYRINGE
INTRAMUSCULAR | Status: DC | PRN
  Administered 2022-09-25: 60 mg via INTRAVENOUS

## 2022-09-25 MED ORDER — GLYCOPYRROLATE 0.2 MG/ML IJ SOLN
INTRAMUSCULAR | Status: DC | PRN
  Administered 2022-09-25: .2 mg via INTRAVENOUS

## 2022-09-25 MED ORDER — PROPRANOLOL HCL 10 MG PO TABS
10.0000 mg | ORAL_TABLET | Freq: Two times a day (BID) | ORAL | Status: DC
  Administered 2022-09-26: 10 mg via ORAL
  Filled 2022-09-25 (×5): qty 1

## 2022-09-25 MED ORDER — FENTANYL CITRATE (PF) 250 MCG/5ML IJ SOLN
INTRAMUSCULAR | Status: AC
  Filled 2022-09-25: qty 5

## 2022-09-25 MED ORDER — CEFAZOLIN SODIUM-DEXTROSE 2-3 GM-%(50ML) IV SOLR
INTRAVENOUS | Status: DC | PRN
  Administered 2022-09-25: 2 g via INTRAVENOUS

## 2022-09-25 SURGICAL SUPPLY — 33 items
BIT DRILL INTERTAN LAG SCREW (BIT) IMPLANT
BIT DRILL LONG 4.0 (BIT) IMPLANT
COVER PERINEAL POST (MISCELLANEOUS) ×1 IMPLANT
COVER SURGICAL LIGHT HANDLE (MISCELLANEOUS) ×1 IMPLANT
DRAPE C-ARM 42X72 X-RAY (DRAPES) ×1 IMPLANT
DRAPE STERI IOBAN 125X83 (DRAPES) ×1 IMPLANT
DRILL BIT LONG 4.0 (BIT) ×1
DRSG TEGADERM 4X4.75 (GAUZE/BANDAGES/DRESSINGS) IMPLANT
DRSG XEROFORM 1X8 (GAUZE/BANDAGES/DRESSINGS) IMPLANT
DURAPREP 26ML APPLICATOR (WOUND CARE) ×1 IMPLANT
GAUZE SPONGE 4X4 12PLY STRL (GAUZE/BANDAGES/DRESSINGS) IMPLANT
GLOVE BIOGEL PI IND STRL 8 (GLOVE) ×2 IMPLANT
GLOVE ORTHO TXT STRL SZ7.5 (GLOVE) ×2 IMPLANT
GOWN STRL REUS W/TWL 2XL LVL3 (GOWN DISPOSABLE) ×1 IMPLANT
GUIDE PIN 3.2X343 (PIN) ×2
GUIDE PIN 3.2X343MM (PIN) ×2
GUIDE ROD 3.0 (MISCELLANEOUS) ×1
KIT BASIN OR (CUSTOM PROCEDURE TRAY) ×1 IMPLANT
KIT TURNOVER KIT B (KITS) ×1 IMPLANT
NAIL INTERTAN 10X18 130D 10S (Nail) IMPLANT
NS IRRIG 1000ML POUR BTL (IV SOLUTION) ×1 IMPLANT
PACK GENERAL/GYN (CUSTOM PROCEDURE TRAY) ×1 IMPLANT
PAD ARMBOARD 7.5X6 YLW CONV (MISCELLANEOUS) ×2 IMPLANT
PIN GUIDE 3.2X343MM (PIN) IMPLANT
ROD GUIDE 3.0 (MISCELLANEOUS) IMPLANT
SCREW LAG COMPR KIT 95/90 (Screw) IMPLANT
SCREW TRIGEN LOW PROF 5.0X27.5 (Screw) IMPLANT
STAPLER VISISTAT 35W (STAPLE) IMPLANT
SUT VIC AB 0 CT1 27 (SUTURE) ×1
SUT VIC AB 0 CT1 27XBRD ANBCTR (SUTURE) ×1 IMPLANT
SUT VIC AB 2-0 CT1 27 (SUTURE) ×1
SUT VIC AB 2-0 CT1 TAPERPNT 27 (SUTURE) ×2 IMPLANT
WATER STERILE IRR 1000ML POUR (IV SOLUTION) ×1 IMPLANT

## 2022-09-25 NOTE — Progress Notes (Signed)
Orthopedic Surgery Progress Note   Assessment: Patient is a 88 y.o. female with left intertrochanteric femur fracture   Plan: -Operative plans: planning for IMN this evening -Diet: NPO for procedure -DVT ppx: heparin -Antibiotics: ancef on call to OR -Weight bearing status: NWB LLE -PT/OT evaluate and treat post-operatively -Pain control -Dispo: pending completion of operative plans  ___________________________________________________________________________  Subjective: No acute events overnight. Moved from the ED to the floor. Pain adequately controlled. Understands plan for surgery this evening. She didn't have any questions about the plan this morning.    Physical Exam:  General: no acute distress, appears stated age Neurologic: alert, answering questions appropriately, following commands Respiratory: unlabored breathing on room air, symmetric chest rise Psychiatric: appropriate affect, normal cadence to speech  MSK:   -Left lower extremity  No tenderness to palpation over extremity, except around the hip Does not fire hip flexors due to pain from known fracture. Fires quadriceps, hamstrings, tibialis anterior, gastrocnemius and soleus, extensor hallucis longus Plantarflexes and dorsiflexes toes Sensation intact to light touch in sural, saphenous, tibial, deep peroneal, and superficial peroneal nerve distributions Foot warm and well perfused   Patient name: Anna Weiss Patient MRN: [id number] Date: 09/25/22

## 2022-09-25 NOTE — Brief Op Note (Signed)
09/25/2022  8:49 PM  PATIENT:  Anna Weiss  88 y.o. female  PRE-OPERATIVE DIAGNOSIS:  left hip fracture  POST-OPERATIVE DIAGNOSIS:  left hip fracture  PROCEDURE:  Procedure(s): INTRAMEDULLARY (IM) NAIL INTERTROCHANTERIC (Left)  SURGEON:  Surgeon(s) and Role:    Callie Fielding, MD - Primary  PHYSICIAN ASSISTANT: NONE  ASSISTANTS: none   ANESTHESIA:   general  EBL:  300 mL   BLOOD ADMINISTERED:none  DRAINS: none   LOCAL MEDICATIONS USED:  NONE  SPECIMEN:  No Specimen  DISPOSITION OF SPECIMEN:  N/A  COUNTS:  YES  TOURNIQUET:  NONE USED  PLAN OF CARE: Admit to inpatient   PATIENT DISPOSITION:  PACU - hemodynamically stable.   Delay start of Pharmacological VTE agent (>24hrs) due to surgical blood loss or risk of bleeding: no

## 2022-09-25 NOTE — Plan of Care (Signed)
  Problem: Pain Managment: Goal: General experience of comfort will improve Outcome: Progressing   Problem: Safety: Goal: Ability to remain free from injury will improve Outcome: Progressing   Problem: Skin Integrity: Goal: Risk for impaired skin integrity will decrease Outcome: Progressing   

## 2022-09-25 NOTE — Op Note (Addendum)
Orthopedic Surgery Operative Report  Procedure: Open reduction of left intertrochanteric femur fracture with use of intramedullary device  Modifier: none  Date of procedure: 09/25/22  Patient name: Anna Weiss MRN: [id number] DOB: August 05, 1925  Surgeon: Ileene Rubens, MD Assistant: none Pre-operative diagnosis: left intertrochanteric femur fracture Post-operative diagnosis: same as above Findings: left intertrochanteric femur fracture  Specimens: none Anesthesia: general EBL: 160FU Complications: none Pre-incision antibiotic: ancef  Implants:  [employer] [place] Implant Name Type Inv. Item Serial No. Manufacturer Lot No. LRB No. Used Action  NAIL INTERTAN 10X18 130D 10S - [id number] Nail NAIL INTERTAN 10X18 130D 10S  [employer] [id number] Left 1 Implanted  SCREW LAG COMPR KIT 95/90 - [id number] Screw SCREW LAG COMPR KIT 95/90  [employer] [id number] Left 1 Implanted  SCREW TRIGEN LOW PROF 5.0X27.5 - [id number] Screw SCREW TRIGEN LOW PROF 5.0X27.5  [employer] [id number] Left 1 Implanted      Indication for procedure: Patient is a 88 y.o. female who presented to the emergency department after a ground level fall. She was found to have a left intertrochanteric femur fracture. I was consulted and recommended operative stabilization with an intramedullary nail. The risks including but not limited to death, blood clot, malunion, nonunion, infection, blood loss requiring transfusion, persistent pain, stiffness, and need for additional procedures were covered with the patient and her daughter [name]). The benefits and alternatives to surgery were also discussed. All of the patient's and daughter's questions were answered to their satisfaction. Plan was made for admission to medicine for optimization with plan for operative intervention the next day.   Procedure Description: The patient was met in the pre-operative holding area. The left leg was  marked by myself. Her consent was verified. The patient was brought back to the operating room by my anesthesia colleagues. Anesthesia was induced and she was intubated. The patient was transferred to the [facility] table in the supine position. All bony prominences were well padded. Traction was applied and the left leg was slightly flexed on the Hana bed. AP and lateral fluoroscopic images confirmed satisfactory reduction. Ancef and TXA were given prior to incision by my anesthesia colleagues. The left leg was cleansed with CHG scrub brush and alcohol. There was no hair over the surgical area to clip. The leg was prepped with DuraPrep. The leg was draped in a standard, sterile fashion. A time out was performed identifying the patient, the planned procedure, and the operative side including laterality. All team members were in agreement.   An incision was made superior and slightly posterior to the tip of the greater trochanter. The incision was carried sharply down through the fascia. A guidewire was placed onto the tip of the greater trochanter. AP and lateral fluoroscopy were used to get the wire at the starting point. Once satisfactory starting point was obtained, the wire was advanced down the femur with taps from the mallet. It was advanced past the fracture site and just past the lesser trochanter. A soft tissue sleeve was placed over the wire and on to the tip of the greater trochanter. The opening reamer was advanced over the wire and was used to open the canal. The guidewire and the opening reamer were removed. A longer guidewire was then placed into the reamed hole. A 10x18 intertan short nail was selected. It was advanced under AP and lateral fluoroscopic guidance down the femur over the guidewire. It was advanced under AP fluoroscopy until the hold for the  lag screws was aimed towards the center of the femoral head. A separate incision was made with a 10 blade sharply through skin and fascia distal to the  initial incision where the lag screws were to inserted through the jig. The long guidewire was removed. A guide pin was advanced through the jig into the femoral head on AP and lateral fluoroscopy. The wire was in the center to the head on AP and lateral fluoroscopy and was advanced to the subchondral bone on the AP view. The lag screw length was estimated off of the guidewire. It was estimated to be a 100 and a 95 was selected since the wire was in the subchondral bone. The track for the inferior lag screw was drilled. The antirotation device was inserted into the drilled hole. The superior lag screws was drilled over the guide pin.The lag screw was advanced over the guide wire into the femoral head under fluoroscopic guidance. Good purchase was obtained. The derotation device was removed and a second lag screws was inserted inferior to the first one. It was also advanced under fluoroscopic guidance and had good purchase. Next, the double sleeve for the interlocking screw was placed through the jig. A drill was used to drill the femur bicortically. A depth gauge was used to estimate the screw length and measured 2. A 27.5 screw was selected and advanced into the drilled hole. Good purchase was obtained with this screw. The jig was removed and final fluoroscopic films were obtained confirming satisfactory reduction and position of the implant.   The fascia was reapproximated using 0 vicryl. The deep dermal layer was reapproximated using 2-0 vicryl. The skin was closed with staples. All counts were correct at the end of the case. The wounds were dressed with xeroform, gauze, tegaderm.   The patient was transferred back to a hospital bed. She was awakened from anesthesia and extubated in the operating room. She was brought back to the PACU in stable condition by my anesthesia colleagues.    Post-operative plan: The patient will recover in the post-anesthesia care unit and then go to the floor on the medicine  service. The patient will receive a post-operative doses of ancef. The patient will be weight bearing as tolerated on the left lower extremity. The patient will work with physical therapy. She is okay for a diet and dvt prophylaxis. The patient's disposition will be determined based off how she is doing on the floor post-operatively.       Ileene Rubens, MD Orthopedic Surgeon

## 2022-09-25 NOTE — Progress Notes (Signed)
[facility] PROGRESS NOTE    Progress Note  Anna Weiss  LHT:[id number] DOB: 1925/05/30 DOA: 09/24/2022 PCP: Mayra Neer, MD     Brief Narrative:   Anna Weiss is an 88 y.o. female past medical history significant for hypertension, chronic bradycardia, essential hypertension sent to the ED after a fall imaging showed acute comminuted left intertrochanteric fracture, chest imaging showed possible patchy lower lobe infiltrates   Assessment/Plan:   Hip fracture, unspecified laterality, closed, initial encounter: Orthopedic surgery was consulted, the patient related that he wanted Dr. Cena Benton to one of his partners to perform surgical intervention. They will evaluate. She relates she is very nauseated give Zofran.  Left lower lobe consolidation: On 2 L of oxygen satting 100% she has remained febrile no leukocytosis she denies any cough. We will not start empiric antibiotics at this time as she has no significant symptoms.  Sinus bradycardia: Appears to be chronic, she is on propranolol at home 10 mg twice a day. Probably contributing to her extreme bradycardia. Hold verapamil she probably does not need it.  Essential hypertension Blood pressures well controlled.  Left parietal scalp hematoma, initial encounter: Showed age-related atrophy remote lacunar infarcts no acute intracranial findings.  Incidental right middle lobe nodule: She will need to follow-up with PCP as an outpatient     DVT prophylaxis: lovenox Family Communication:none Status is: Inpatient Remains inpatient appropriate because: Acute hip fracture    Code Status:     Code Status Orders  (From admission, onward)           Start     Ordered   09/24/22 1719  Do not attempt resuscitation (DNR)  Continuous       Question Answer Comment  In the event of cardiac or respiratory ARREST Do not call a "code blue"   In the event of cardiac or respiratory ARREST Do not perform Intubation, CPR,  defibrillation or ACLS   In the event of cardiac or respiratory ARREST Use medication by any route, position, wound care, and other measures to relive pain and suffering. May use oxygen, suction and manual treatment of airway obstruction as needed for comfort.      09/24/22 1718           Code Status History     Date Active Date Inactive Code Status Order ID Comments User Context   09/24/2022 1647 09/24/2022 1718 Full Code [id number]  Lequita Halt, MD ED      Advance Directive Documentation    Flowsheet Row Most Recent Value  Type of Advance Directive Healthcare Power of Attorney, Out of facility DNR (pink MOST or yellow form)  Pre-existing out of facility DNR order (yellow form or pink MOST form) --  "MOST" Form in Place? --         IV Access:   Peripheral IV   Procedures and diagnostic studies:   CT CHEST WO CONTRAST  Result Date: 09/24/2022 CLINICAL DATA:  Pneumonia EXAM: CT CHEST WITHOUT CONTRAST TECHNIQUE: Multidetector CT imaging of the chest was performed following the standard protocol without IV contrast. RADIATION DOSE REDUCTION: This exam was performed according to the departmental dose-optimization program which includes automated exposure control, adjustment of the mA and/or kV according to patient size and/or use of iterative reconstruction technique. COMPARISON:  Previous studies including the chest radiograph done earlier today FINDINGS: Cardiovascular: Heart is enlarged in size. Coronary artery calcifications are seen. Atherosclerotic plaques and calcifications are seen in thoracic aorta. There is ectasia of main  pulmonary artery measuring 3.5 cm suggesting pulmonary arterial hypertension. Mediastinum/Nodes: No significant lymphadenopathy seen. Lungs/Pleura: In the image 75 of series 4, there is 2.3 x 1.9 cm lobulated noncalcified nodule in right middle lobe. In image 83, there is 9 x 6 mm noncalcified nodule in right middle lobe. There are linear densities in  both apices. There are small patchy infiltrates in lingula and both lower lobes, more so on the left side. There is decreased volume in the left lower lobe. Small bilateral pleural effusions are seen. There is no pneumothorax. Upper Abdomen: There is high density in the lumen of stomach, possibly oral medication. Musculoskeletal: There is decrease in height of upper endplate of body of L1 vertebra. L1 vertebra is not included in its entirety. Fairly extensive arterial calcifications are seen in aorta and its major branches. IMPRESSION: There is 2.3 cm noncalcified nodule in right middle lobe. This lesion has to be considered primary malignant neoplasm until proven otherwise. Follow-up PET-CT and tissue sampling should be considered. There is 9 mm satellite nodule in right middle lobe suggesting possible metastatic disease. There are small patchy infiltrates in lingula and both lower lobes suggesting atelectasis/pneumonia. Small bilateral pleural effusions are seen. There is decrease in height of upper endplate of body of L1 vertebra suggesting recent or old compression fracture. L1 vertebra is not included in its entirety limiting evaluation. Cardiomegaly. Coronary artery disease. Aortic arteriosclerosis. There is ectasia of main pulmonary artery suggesting pulmonary arterial hypertension. Electronically Signed   By: Elmer Picker M.D.   On: 09/24/2022 19:27   CT Hip Left Wo Contrast  Result Date: 09/24/2022 CLINICAL DATA:  Left hip fracture EXAM: CT OF THE LEFT HIP WITHOUT CONTRAST TECHNIQUE: Multidetector CT imaging of the left hip was performed according to the standard protocol. Multiplanar CT image reconstructions were also generated. RADIATION DOSE REDUCTION: This exam was performed according to the departmental dose-optimization program which includes automated exposure control, adjustment of the mA and/or kV according to patient size and/or use of iterative reconstruction technique. COMPARISON:   09/24/2022 x-ray FINDINGS: Bones/Joint/Cartilage Acute comminuted intertrochanteric fracture of the proximal left femur mildly impacted with mild varus angulation. Lesser trochanteric fragment is slightly displaced medially. Greater trochanteric fragment is also mildly displaced posteriorly. Femoroacetabular joint alignment is maintained without dislocation. 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 Visualized portion of the left hemipelvis is intact. No lytic or sclerotic bone lesions. Ligaments Suboptimally assessed by CT. Muscles and Tendons Posttraumatic bony avulsions of the greater and lesser trochanters. Otherwise, no acute musculotendinous abnormality. Soft tissues Soft tissue swelling at the fracture site. No inguinal lymphadenopathy. Colonic diverticulosis. Atherosclerotic vascular calcifications. IMPRESSION: Acute comminuted intertrochanteric fracture of the proximal left femur. Electronically Signed   By: Davina Poke D.O.   On: 09/24/2022 14:43   DG FEMUR MIN 2 VIEWS LEFT  Result Date: 09/24/2022 CLINICAL DATA:  Left hip fracture, fall EXAM: LEFT FEMUR 2 VIEWS COMPARISON:  09/24/2022 FINDINGS: There is redemonstration of an acute left hip intertrochanteric fracture with displacement and angulation. Distal aspect of the left femur intact. Visualized pelvis unremarkable. Peripheral vascular calcifications noted. IMPRESSION: Acute left hip intertrochanteric fracture. Electronically Signed   By: Jerilynn Mages.  Shick M.D.   On: 09/24/2022 14:27   DG Pelvis 1-2 Views  Result Date: 09/24/2022 CLINICAL DATA:  Fall, pain EXAM: PELVIS - 1-2 VIEW COMPARISON:  02/10/2022 FINDINGS: There is an acute displaced left hip intertrochanteric fracture. Bones are osteopenic. Bony pelvis and right hip appear intact. Degenerative changes noted of the lumbosacral spine,  both SI joints as well as the hips. IMPRESSION: Acute displaced left hip intertrochanteric fracture. Electronically Signed   By: Jerilynn Mages.  Shick M.D.   On:  09/24/2022 14:25   DG Chest 1 View  Result Date: 09/24/2022 CLINICAL DATA:  Fall, pain EXAM: CHEST  1 VIEW COMPARISON:  None Available. FINDINGS: Left lower lung opacity obscures the left hemidiaphragm and left cardiac border concerning for left lower lobe collapse/consolidation as well as associated left effusion. Heart is enlarged. Right lung remains clear. Negative for pneumothorax. No edema pattern. Aorta atherosclerotic. Bones are osteopenic. IMPRESSION: Left lower lung opacity concerning for left lower lobe collapse/consolidation and associated left effusion. Cardiomegaly without CHF pattern Aortic Atherosclerosis (ICD10-I70.0). Electronically Signed   By: Jerilynn Mages.  Shick M.D.   On: 09/24/2022 14:24   DG Shoulder Left  Result Date: 09/24/2022 CLINICAL DATA:  Fall, pain EXAM: LEFT SHOULDER - 2+ VIEW COMPARISON:  None Available. FINDINGS: Slight degenerative changes of the left shoulder joint and AC joint. Bones are osteopenic. No acute osseous finding, fracture or malalignment. Included chest demonstrates aortic atherosclerosis. IMPRESSION: Osteopenia and degenerative changes. No acute finding by plain radiography. Electronically Signed   By: Jerilynn Mages.  Shick M.D.   On: 09/24/2022 14:22   CT HEAD WO CONTRAST  Result Date: 09/24/2022 CLINICAL DATA:  [place].  Hit head. EXAM: CT HEAD WITHOUT CONTRAST TECHNIQUE: Contiguous axial images were obtained from the base of the skull through the vertex without intravenous contrast. RADIATION DOSE REDUCTION: This exam was performed according to the departmental dose-optimization program which includes automated exposure control, adjustment of the mA and/or kV according to patient size and/or use of iterative reconstruction technique. COMPARISON:  None Available. FINDINGS: Brain: Age related cerebral atrophy, ventriculomegaly and periventricular white matter disease. Remote infarcts. No extra-axial fluid collections are identified. No CT findings for acute hemispheric  infarction or intracranial hemorrhage. No mass lesions. The brainstem and cerebellum are normal. Vascular: Vascular calcifications but no aneurysm or hyperdense vessels. Skull: No acute skull fracture or bone lesions. Sinuses/Orbits: The paranasal sinuses and mastoid air cells are clear. The globes are intact. Other: No scalp lesions or scalp hematoma. IMPRESSION: 1. Age related cerebral atrophy, ventriculomegaly and periventricular white matter disease. 2. Remote cerebral infarctions. 3. No acute intracranial findings or skull fracture. Electronically Signed   By: Marijo Sanes M.D.   On: 09/24/2022 14:07   CT CERVICAL SPINE WO CONTRAST  Result Date: 09/24/2022 CLINICAL DATA:  Fall EXAM: CT CERVICAL SPINE WITHOUT CONTRAST TECHNIQUE: Multidetector CT imaging of the cervical spine was performed without intravenous contrast. Multiplanar CT image reconstructions were also generated. RADIATION DOSE REDUCTION: This exam was performed according to the departmental dose-optimization program which includes automated exposure control, adjustment of the mA and/or kV according to patient size and/or use of iterative reconstruction technique. COMPARISON:  Cervical radiographs 04/30/2005 FINDINGS: Alignment: Mild anterolisthesis C5-6 Skull base and vertebrae: Negative for cervical spine fracture Soft tissues and spinal canal: Negative for soft tissue mass. Atherosclerotic calcification in the carotid bifurcation bilaterally. Disc levels: Cervical spondylosis, mild for age. Bilateral facet degeneration. Negative for spinal stenosis. Upper chest: Apical pleuroparenchymal scarring bilaterally Other: None IMPRESSION: Cervical spondylosis. Negative for fracture. Electronically Signed   By: Franchot Gallo M.D.   On: 09/24/2022 14:05     Medical Consultants:   None.   Subjective:    Anna Weiss she relates her pain is not controlled she is nauseated  Objective:    Vitals:   09/25/22 0125 09/25/22 0325 09/25/22  0525 09/25/22  0713  BP: (!) 172/65 (!) 159/52 (!) 132/59 (!) 152/41  Pulse: (!) 44 (!) 44 (!) 30 63  Resp: '14 14 15   '$ Temp:    97.7 F (36.5 C)  TempSrc:    Oral  SpO2: 100% 100% 100% 100%   SpO2: 100 % O2 Flow Rate (L/min): 2 L/min  No intake or output data in the 24 hours ending 09/25/22 0739 There were no vitals filed for this visit.  Exam: General exam: In no acute distress. Respiratory system: Good air movement and clear to auscultation. Cardiovascular system: S1 & S2 heard, RRR. No JVD. Gastrointestinal system: Abdomen is nondistended, soft and nontender.  Extremities: No pedal edema. Skin: No rashes, lesions or ulcers Psychiatry: Judgement and insight appear normal. Mood & affect appropriate.    Data Reviewed:    Labs: Basic Metabolic Panel: Recent Labs  Lab 09/24/22 1341  NA 136  135  K 3.9  3.8  CL 100  96*  CO2 27  GLUCOSE 163*  166*  BUN 9  10  CREATININE 0.80  0.70  CALCIUM 9.5   GFR CrCl cannot be calculated (Unknown ideal weight.). Liver Function Tests: Recent Labs  Lab 09/24/22 1341  AST 22  ALT 18  ALKPHOS 51  BILITOT 1.0  PROT 6.0*  ALBUMIN 3.8   No results for input(s): "LIPASE", "AMYLASE" in the last 168 hours. No results for input(s): "AMMONIA" in the last 168 hours. Coagulation profile Recent Labs  Lab 09/24/22 1341  INR 1.1   COVID-19 Labs  No results for input(s): "DDIMER", "FERRITIN", "LDH", "CRP" in the last 72 hours.  No results found for: "SARSCOV2NAA"  CBC: Recent Labs  Lab 09/24/22 1341  WBC 9.8  HGB 12.8  12.9  HCT 36.8  38.0  MCV 104.0*  PLT 213   Cardiac Enzymes: No results for input(s): "CKTOTAL", "CKMB", "CKMBINDEX", "TROPONINI" in the last 168 hours. BNP (last 3 results) No results for input(s): "PROBNP" in the last 8760 hours. CBG: No results for input(s): "GLUCAP" in the last 168 hours. D-Dimer: No results for input(s): "DDIMER" in the last 72 hours. Hgb A1c: No results for input(s):  "HGBA1C" in the last 72 hours. Lipid Profile: No results for input(s): "CHOL", "HDL", "LDLCALC", "TRIG", "CHOLHDL", "LDLDIRECT" in the last 72 hours. Thyroid function studies: Recent Labs    09/24/22 1341  TSH 1.911   Anemia work up: No results for input(s): "VITAMINB12", "FOLATE", "FERRITIN", "TIBC", "IRON", "RETICCTPCT" in the last 72 hours. Sepsis Labs: Recent Labs  Lab 09/24/22 1339 09/24/22 1341  WBC  --  9.8  LATICACIDVEN 1.2  --    Microbiology Recent Results (from the past 240 hour(s))  MRSA Next Gen by PCR, Nasal     Status: None   Collection Time: 09/24/22 10:23 PM   Specimen: Nasal Mucosa; Nasal Swab  Result Value Ref Range Status   MRSA by PCR Next Gen NOT DETECTED NOT DETECTED Final    Comment: (NOTE) The GeneXpert MRSA Assay (FDA approved for NASAL specimens only), is one component of a comprehensive MRSA colonization surveillance program. It is not intended to diagnose MRSA infection nor to guide or monitor treatment for MRSA infections. Test performance is not FDA approved in patients less than 38 years old. Performed at [facility], [place] [address], [place], [place] [address]      Medications:    heparin  5,000 Units Subcutaneous Q12H   hydrochlorothiazide  25 mg Oral Daily   levothyroxine  50 mcg  Oral Q0600   polyvinyl alcohol  1 drop Both Eyes BID   simvastatin  10 mg Oral QPM   Continuous Infusions:    LOS: 1 day   Charlynne Cousins  [facility]  09/25/2022, 7:39 AM

## 2022-09-25 NOTE — Anesthesia Preprocedure Evaluation (Addendum)
Anesthesia Evaluation  Patient identified by MRN, date of birth, ID band Patient awake    Reviewed: Allergy & Precautions, H&P , NPO status , Patient's Chart, lab work & pertinent test results  Airway Mallampati: II  TM Distance: >3 FB Neck ROM: Limited    Dental no notable dental hx.    Pulmonary pneumonia, unresolved   Pulmonary exam normal breath sounds clear to auscultation       Cardiovascular hypertension, Normal cardiovascular exam+ dysrhythmias  Rhythm:Regular Rate:Bradycardia  bradycardia   Left Ventricle: Left ventricular ejection fraction, by estimation, is 60  to 65%. The left ventricle has normal function. The left ventricle has no  regional wall motion abnormalities. Definity contrast agent was given IV  to delineate the left ventricular   endocardial borders. The left ventricular internal cavity size was normal  in size. There is mild concentric left ventricular hypertrophy. Left  ventricular diastolic parameters were normal. Normal left ventricular  filling pressure.   Right Ventricle: The right ventricular size is normal. No increase in  right ventricular wall thickness. Right ventricular systolic function is  mildly reduced. Tricuspid regurgitation signal is inadequate for assessing  PA pressure.   Left Atrium: Left atrial size was severely dilated.   Right Atrium: Right atrial size was severely dilated.   Pericardium: There is no evidence of pericardial effusion.   Mitral Valve: The mitral valve is degenerative in appearance. There is  mild thickening of the mitral valve leaflet(s). There is mild  calcification of the mitral valve leaflet(s). Mild mitral annular  calcification. Mild mitral valve regurgitation. No  evidence of mitral valve stenosis.   Tricuspid Valve: The tricuspid valve is normal in structure. Tricuspid  valve regurgitation is not demonstrated. No evidence of tricuspid  stenosis.    Aortic Valve: The aortic valve is tricuspid. Aortic valve regurgitation is  trivial. Aortic valve sclerosis/calcification is present, without any  evidence of aortic stenosis. Aortic valve mean gradient measures 7.3 mmHg.  Aortic valve peak gradient  measures 13.2 mmHg. Aortic valve area, by VTI measures 1.08 cm     Neuro/Psych CVA  negative psych ROS   GI/Hepatic negative GI ROS, Neg liver ROS,,,  Endo/Other  negative endocrine ROS    Renal/GU negative Renal ROS  negative genitourinary   Musculoskeletal negative musculoskeletal ROS (+)    Abdominal   Peds negative pediatric ROS (+)  Hematology negative hematology ROS (+)   Anesthesia Other Findings   Reproductive/Obstetrics negative OB ROS                             Anesthesia Physical Anesthesia Plan  ASA: 4  Anesthesia Plan: General   Post-op Pain Management: Minimal or no pain anticipated   Induction: Intravenous  PONV Risk Score and Plan: 3 and Ondansetron, Dexamethasone and Treatment may vary due to age or medical condition  Airway Management Planned: Oral ETT  Additional Equipment:   Intra-op Plan:   Post-operative Plan: Extubation in OR  Informed Consent: I have reviewed the patients History and Physical, chart, labs and discussed the procedure including the risks, benefits and alternatives for the proposed anesthesia with the patient or authorized representative who has indicated his/her understanding and acceptance.   Patient has DNR.  Discussed DNR with power of attorney and Suspend DNR.   Dental advisory given  Plan Discussed with: CRNA and Surgeon  Anesthesia Plan Comments:        Anesthesia Quick Evaluation

## 2022-09-25 NOTE — Progress Notes (Signed)
Initial Nutrition Assessment  DOCUMENTATION CODES:   Not applicable  INTERVENTION:  Once diet resumes, recommend: Regular diet to provide widest variety of menu items to optimize nutritional intake Ensure Enlive po BID, each supplement provides 350 kcal and 20 grams of protein. MVI with minerals daily Request updated weight   NUTRITION DIAGNOSIS:   Increased nutrient needs related to hip fracture as evidenced by estimated needs.  GOAL:   Patient will meet greater than or equal to 90% of their needs  MONITOR:   PO intake, Supplement acceptance, Diet advancement, Labs, Weight trends  REASON FOR ASSESSMENT:   Consult Hip fracture protocol  ASSESSMENT:   Pt admitted with L hip fracture. PMH significant for HTN, chronic bradycardia/junctional rhythm, HTN, remote stroke 2002/03/04)  Orthopedics plans for IMN this evening.   Pt endorses pain and associated nausea today. She states that she has had a decreased appetite for a while but her intake is better some days over others. Her husband passed away in March 05, 2023 and she has been dealing with medical problems herself. She recalls eating 1 big meal either at lunch or dinner if her daughter joins her, otherwise she has something light for breakfast and dinner. She does not drink nutrition supplements at home.   She states that her last known weight measurement was obtained at her doctor's appointment in August where her MD told her that she has lost about 9 lbs since January and wanted her to gain weight.   Unfortunately there is limited documentation of weight history on file. Last documented weight was 5/16 at 70.8 kg. Will request updated weight to assess for recent weight loss.   Question whether muscle and fat depletions are age related versus illness related. Pt is at high nutrition risk.  Medications: miralax, IV abx. Labs reviewed  NUTRITION - FOCUSED PHYSICAL EXAM:  Flowsheet Row Most Recent Value  Orbital Region Moderate  depletion  Upper Arm Region No depletion  Thoracic and Lumbar Region No depletion  Buccal Region Moderate depletion  Temple Region Unable to assess  [head wrapped in bandage]  Clavicle Bone Region Mild depletion  Clavicle and Acromion Bone Region No depletion  Scapular Bone Region No depletion  Dorsal Hand Mild depletion  Patellar Region No depletion  Anterior Thigh Region No depletion  Posterior Calf Region No depletion  Edema (RD Assessment) None  Hair Reviewed  Eyes Reviewed  Mouth Reviewed  Skin Reviewed  Nails Reviewed      Diet Order:   Diet Order             Diet NPO time specified Except for: Sips with Meds  Diet effective midnight                  EDUCATION NEEDS:  Education needs have been addressed  Skin:  Skin Assessment: Reviewed RN Assessment  Last BM:  11/1  Height:  Ht Readings from Last 1 Encounters:  04/08/22 5' 6.5" (1.689 m)   Weight:  Wt Readings from Last 1 Encounters:  04/08/22 70.8 kg   BMI:  There is no height or weight on file to calculate BMI.  Estimated Nutritional Needs:  Kcal:  1500-1700 Protein:  85-100g Fluid:  >/=1.5L  Clayborne Dana, RDN, LDN Clinical Nutrition

## 2022-09-25 NOTE — H&P (Signed)
Orthopedic Surgery H&P Update  Patient's history and physical reviewed from this admission - no updates at this time Risks of surgery (including but not limited to death, MI, blood clot, bleeding requiring transfusion, hardware failure, nonunion, malunion, infection, need for additional procedures) were covered again, patient elected to continue with planned procedure Written consent verified Site marked Ancef on call to OR To OR when ready  Ileene Rubens, MD Orthopedic Surgeon

## 2022-09-25 NOTE — Transfer of Care (Signed)
Immediate Anesthesia Transfer of Care Note  Patient: Anna Weiss  Procedure(s) Performed: INTRAMEDULLARY (IM) NAIL INTERTROCHANTERIC (Left)  Patient Location: PACU  Anesthesia Type:General  Level of Consciousness: awake, alert , and oriented  Airway & Oxygen Therapy: Patient Spontanous Breathing and Patient connected to nasal cannula oxygen  Post-op Assessment: Report given to RN, Post -op Vital signs reviewed and stable, and Patient moving all extremities  Post vital signs: Reviewed and stable  Last Vitals:  Vitals Value Taken Time  BP 132/61 09/25/22 2102  Temp    Pulse 63 09/25/22 2107  Resp 19 09/25/22 2107  SpO2 98 % 09/25/22 2107  Vitals shown include unvalidated device data.  Last Pain:  Vitals:   09/25/22 1730  TempSrc:   PainSc: 7       Patients Stated Pain Goal: 0 (47/84/12 8208)  Complications: No notable events documented.

## 2022-09-25 NOTE — Anesthesia Procedure Notes (Signed)
Procedure Name: Intubation Date/Time: 09/25/2022 6:57 PM  Performed by: Leyton Magoon T, CRNAPre-anesthesia Checklist: Patient identified, Emergency Drugs available, Suction available and Patient being monitored Patient Re-evaluated:Patient Re-evaluated prior to induction Oxygen Delivery Method: Circle system utilized Preoxygenation: Pre-oxygenation with 100% oxygen Induction Type: IV induction and Cricoid Pressure applied Ventilation: Mask ventilation without difficulty Laryngoscope Size: Mac and 3 Grade View: Grade I Tube type: Oral Tube size: 7.0 mm Number of attempts: 1 Airway Equipment and Method: Stylet and Oral airway Placement Confirmation: ETT inserted through vocal cords under direct vision, positive ETCO2 and breath sounds checked- equal and bilateral Secured at: 21 cm Tube secured with: Tape Dental Injury: Teeth and Oropharynx as per pre-operative assessment

## 2022-09-25 NOTE — Progress Notes (Signed)
  Echocardiogram 2D Echocardiogram has been performed.  Anna Weiss 09/25/2022, 11:45 AM

## 2022-09-25 NOTE — Anesthesia Postprocedure Evaluation (Signed)
Anesthesia Post Note  Patient: Anna Weiss  Procedure(s) Performed: INTRAMEDULLARY (IM) NAIL INTERTROCHANTERIC (Left)     Patient location during evaluation: PACU Anesthesia Type: General Level of consciousness: awake and alert Pain management: pain level controlled Vital Signs Assessment: post-procedure vital signs reviewed and stable Respiratory status: spontaneous breathing, nonlabored ventilation, respiratory function stable and patient connected to nasal cannula oxygen Cardiovascular status: blood pressure returned to baseline and stable Postop Assessment: no apparent nausea or vomiting Anesthetic complications: no  No notable events documented.  Last Vitals:  Vitals:   09/25/22 2130 09/25/22 2145  BP: (!) 157/69 (!) 145/71  Pulse: 65 60  Resp:    Temp:    SpO2: 100%     Last Pain:  Vitals:   09/25/22 2145  TempSrc:   PainSc: 6                  Tiajuana Amass

## 2022-09-25 NOTE — Progress Notes (Signed)
Pre-op report called and given to Minna Merritts, RN in Short Stay. All questions answered to satisfaction. OR personnel at bedside to transport pt.

## 2022-09-25 NOTE — Progress Notes (Signed)
Orthopedic Surgery Progress Note   Assessment: Patient is a 88 y.o. female with left intertrochanteric femur fracture status post IMN   Plan: -Operative plans: complete -Diet: regular -DVT ppx: heparin -Antibiotics: ancef post-op dose -Weight bearing status: weight bearing as tolerated left lower extremity -PT/OT evaluate and treat -Pain control -Dispo: pending clinical course and PT/OT post-op  ___________________________________________________________________________  Subjective: No acute events since surgery. Recovering in PACU. Having some pain around the hip but medications are helping.    Physical Exam:  General: no acute distress, laying in bed, following commands Respiratory: unlabored breathing on supplemental O2, symmetric chest rise Skin: dressings c/d/i  MSK:   -Left lower extremity Fires TA, GSC, EHL Plantarflexes and dorsiflexes toes Sensation intact to light touch in sural, saphenous, tibial, deep peroneal, and superficial peroneal nerve distributions Foot warm and well perfused, palpable DP pulse   Patient name: Anna Weiss Patient MRN: [id number] Date: 09/25/22

## 2022-09-26 ENCOUNTER — Encounter (HOSPITAL_COMMUNITY): Payer: Self-pay | Admitting: Orthopedic Surgery

## 2022-09-26 ENCOUNTER — Inpatient Hospital Stay (HOSPITAL_COMMUNITY): Payer: Medicare Other

## 2022-09-26 DIAGNOSIS — S72002A Fracture of unspecified part of neck of left femur, initial encounter for closed fracture: Secondary | ICD-10-CM | POA: Diagnosis not present

## 2022-09-26 DIAGNOSIS — I10 Essential (primary) hypertension: Secondary | ICD-10-CM | POA: Diagnosis not present

## 2022-09-26 MED ORDER — SODIUM CHLORIDE 0.9 % IV SOLN
500.0000 mg | Freq: Every day | INTRAVENOUS | Status: AC
  Administered 2022-09-26: 500 mg via INTRAVENOUS
  Filled 2022-09-26: qty 5

## 2022-09-26 MED ORDER — SODIUM CHLORIDE 0.9 % IV SOLN
2.0000 g | Freq: Every day | INTRAVENOUS | Status: AC
  Administered 2022-09-26: 2 g via INTRAVENOUS
  Filled 2022-09-26: qty 20

## 2022-09-26 NOTE — Evaluation (Signed)
Physical Therapy Evaluation Patient Details Name: Anna Weiss MRN: [id number] DOB: November 03, 1925 Today's Date: 09/26/2022  History of Present Illness  Pt is 88 year old presented to [facility] on  09/24/22 after a fall at home with lt hip fx and underwent IM nail on 09/25/22 . PMH - chronic bradycardia/junctional rhythm, HTN, CVA 2003.  Clinical Impression  Pt presents to PT with decr mobility after hip fx and subsequent ORIF. Prior to fx pt was independent with mobility and ADL's and lived in her own home with family checking in or staying with regularly. Pt motivated to return to prior level of function. Today pt was able to tolerate standing twice with Stedy at bedside. Feel pt would be a good candidate for AIR.      Recommendations for follow up therapy are one component of a multi-disciplinary discharge planning process, led by the attending physician.  Recommendations may be updated based on patient status, additional functional criteria and insurance authorization.  Follow Up Recommendations Acute inpatient rehab (3hours/day)      Assistance Recommended at Discharge Frequent or constant Supervision/Assistance  Patient can return home with the following  A lot of help with walking and/or transfers;A lot of help with bathing/dressing/bathroom;Assist for transportation;Help with stairs or ramp for entrance    Equipment Recommendations Rolling walker (2 wheels);BSC/3in1;Wheelchair (measurements PT);Wheelchair cushion (measurements PT)  Recommendations for Other Services  OT consult;Rehab consult    Functional Status Assessment Patient has had a recent decline in their functional status and demonstrates the ability to make significant improvements in function in a reasonable and predictable amount of time.     Precautions / Restrictions Precautions Precautions: Fall Restrictions Weight Bearing Restrictions: Yes LLE Weight Bearing: Weight bearing as tolerated (Per Ortho MD post op notes on  11/2 and 11/3)      Mobility  Bed Mobility Overal bed mobility: Needs Assistance Bed Mobility: Supine to Sit, Sit to Supine     Supine to sit: +2 for physical assistance, Mod assist Sit to supine: +2 for physical assistance, Max assist   General bed mobility comments: Assist to bring legs off of bed, elevate trunk into sitting, and bring hips to EOB. Assist to lower trunk and bring legs back up into bed    Transfers Overall transfer level: Needs assistance Equipment used: Ambulation equipment used Transfers: Sit to/from Stand Sit to Stand: +2 physical assistance, Mod assist           General transfer comment: Assist to power up and manual facilitation to extend hips and trunk in standing    Ambulation/Gait             Pre-gait activities: Stood x 2 for 30-45 sec each time with Stedy with +2 min assist to maintain    Stairs            Wheelchair Mobility    Modified Rankin (Stroke Patients Only)       Balance Overall balance assessment: Needs assistance Sitting-balance support: No upper extremity supported, Feet supported Sitting balance-Leahy Scale: Fair     Standing balance support: Bilateral upper extremity supported, During functional activity, Reliant on assistive device for balance Standing balance-Leahy Scale: Poor Standing balance comment: Stedy and +2 min assist for static standing                             Pertinent Vitals/Pain Pain Assessment Pain Assessment: Faces Faces Pain Scale: Hurts little more Pain Location: lt  hip Pain Descriptors / Indicators: Grimacing, Guarding Pain Intervention(s): Premedicated before session, Limited activity within patient's tolerance, Monitored during session, Repositioned    Home Living Family/patient expects to be discharged to:: Private residence Living Arrangements: Alone;Children Available Help at Discharge: Family;Available 24 hours/day Type of Home: House Home Access: Stairs to  enter Entrance Stairs-Rails: Psychiatric nurse of Steps: 1   Home Layout: One level Home Equipment: Toilet riser;Cane - single point      Prior Function Prior Level of Function : Independent/Modified Independent             Mobility Comments: Uses cane for mobility. Likes to [employer]        Extremity/Trunk Assessment   Upper Extremity Assessment Upper Extremity Assessment: Defer to OT evaluation    Lower Extremity Assessment Lower Extremity Assessment: Generalized weakness;LLE deficits/detail LLE Deficits / Details: Limited by pain of hip fx       Communication   Communication: No difficulties  Cognition Arousal/Alertness: Awake/alert Behavior During Therapy: WFL for tasks assessed/performed Overall Cognitive Status: Within Functional Limits for tasks assessed                                          General Comments General comments (skin integrity, edema, etc.): VSS. Pt with baseline bradycardia. HR 50-60's during session    Exercises     Assessment/Plan    PT Assessment Patient needs continued PT services  PT Problem List Decreased strength;Decreased activity tolerance;Decreased balance;Decreased mobility;Pain       PT Treatment Interventions DME instruction;Gait training;Functional mobility training;Therapeutic activities;Therapeutic exercise;Balance training;Patient/family education    PT Goals (Current goals can be found in the Care Plan section)  Acute Rehab PT Goals Patient Stated Goal: return home PT Goal Formulation: With patient Time For Goal Achievement: 10/10/22 Potential to Achieve Goals: Good    Frequency Min 3X/week     Co-evaluation               AM-PAC PT "6 Clicks" Mobility  Outcome Measure Help needed turning from your back to your side while in a flat bed without using bedrails?: Total Help needed moving from lying on your back to sitting on the side of a flat bed  without using bedrails?: Total Help needed moving to and from a bed to a chair (including a wheelchair)?: Total Help needed standing up from a chair using your arms (e.g., wheelchair or bedside chair)?: Total Help needed to walk in hospital room?: Total Help needed climbing 3-5 steps with a railing? : Total 6 Click Score: 6    End of Session   Activity Tolerance: Patient tolerated treatment well Patient left: in bed;with call bell/phone within reach;with bed alarm set;with family/visitor present Nurse Communication: Mobility status;Need for lift equipment PT Visit Diagnosis: Other abnormalities of gait and mobility (R26.89);Muscle weakness (generalized) (M62.81);Pain Pain - Right/Left: Left Pain - part of body: Hip    Time: 6010-9323 PT Time Calculation (min) (ACUTE ONLY): 22 min   Charges:   PT Evaluation $PT Eval Moderate Complexity: [place] Office [place] 09/26/2022, 4:57 PM

## 2022-09-26 NOTE — Progress Notes (Signed)
Orthopedic Surgery Progress Note   Assessment: Patient is a 88 y.o. female with left intertrochanteric femur fracture status post IMN   Plan: -Operative plans: complete -Diet: regular -DVT ppx: heparin -Antibiotics: ancef post-op doses -Weight bearing status: weight bearing as tolerated left lower extremity -PT/OT evaluate and treat -Pain control -Dispo: pending clinical course and PT/OT  ___________________________________________________________________________  Subjective: No acute events overnight. Made it back up to the floor from PACU. Has had some left hip pain but pain medications are helping. Biggest complaint this morning is that her mouth is dry. Has not tried mobilizing yet. Denies paresthesias and numbness.    Physical Exam:  General: no acute distress, appears stated age Neurologic: alert, answering questions appropriately, following commands Respiratory: unlabored breathing on room air, symmetric chest rise Psychiatric: appropriate affect, normal cadence to speech  MSK:   -Left lower extremity  Dressing with small amount of blood otherwise c/d/i Fires quadriceps, hamstrings, tibialis anterior, gastrocnemius and soleus, extensor hallucis longus Plantarflexes and dorsiflexes toes Sensation intact to light touch in sural, saphenous, tibial, deep peroneal, and superficial peroneal nerve distributions Foot warm and well perfused, palpable DP pulse   Patient name: Anna Weiss Patient MRN: [id number] Date: 09/26/22

## 2022-09-26 NOTE — Evaluation (Signed)
Clinical/Bedside Swallow Evaluation Patient Details  Name: CARMELLA KEES MRN: [id number] Date of Birth: Apr 14, 1925  Today's Date: 09/26/2022 Time: SLP Start Time (ACUTE ONLY): 1201 SLP Stop Time (ACUTE ONLY): 1218 SLP Time Calculation (min) (ACUTE ONLY): 17 min  Past Medical History:  Past Medical History:  Diagnosis Date   High cholesterol    Hypertension    Stroke ([place])    2003   Tachycardia    Past Surgical History:  Past Surgical History:  Procedure Laterality Date   ABDOMINAL HYSTERECTOMY     HPI:  HALLELUJAH WYSONG is an 88 y.o. female who presented to the ED after a fall imaging showed acute comminuted left intertrochanteric fracture, chest imaging showed possible patchy lower lobe infiltrates.  CXR 11/1 favoring atalectasis with pna ruled out per chart review.  Pt now s/o surgery for hip fx.  Pt with past medical history significant for hypertension, chronic bradycardia, essential hypertension.    Assessment / Plan / Recommendation  Clinical Impression  Pt presents with functional swallowing as assessed clinically.  Pt tolerated all consistencies trialed with no clinical s/s of aspiration.  Pt exhibted good oral clearance of solids.  RN and pt report difficutly with drinking liquids and taking pills post surgery.  Pt reports she has had this sensation before maybe 1-2x monthly, but it has greatly increased in frequency since surgery.  Pt describes feeling of stasis and aerophagia which resolves with belching.  This happens more often with liquid than with foods.   She denies feeling of food/drink going the wrong way or coughing. If these symptoms continue, consider assessment for esophageal dysphagia/dysmotility.  Pt has no further ST needs. SLP will sign off at this time.    Recommend resuming regular texture diet with thin liquids.  Administer pills whole with puree.  SLP Visit Diagnosis: Dysphagia, unspecified (R13.10)    Aspiration Risk  No limitations    Diet Recommendation  Regular;Thin liquid   Liquid Administration via: Cup;Straw Medication Administration: Whole meds with puree Supervision: Patient able to self feed Compensations: Slow rate;Small sips/bites Postural Changes: Seated upright at 90 degrees;Remain upright for at least 30 minutes after po intake    Other  Recommendations Recommended Consults: Consider esophageal assessment Oral Care Recommendations: Oral care BID    Recommendations for follow up therapy are one component of a multi-disciplinary discharge planning process, led by the attending physician.  Recommendations may be updated based on patient status, additional functional criteria and insurance authorization.  Follow up Recommendations No SLP follow up      Assistance Recommended at Discharge None  Functional Status Assessment Patient has not had a recent decline in their functional status  Frequency and Duration  (N/A)          Prognosis Prognosis for Safe Diet Advancement:  (N/A)      Swallow Study   General Date of Onset: 09/24/22 HPI: CHANTALLE DEFILIPPO is an 88 y.o. female who presented to the ED after a fall imaging showed acute comminuted left intertrochanteric fracture, chest imaging showed possible patchy lower lobe infiltrates.  CXR 11/1 favoring atalectasis with pna ruled out per chart review.  Pt now s/o surgery for hip fx.  Pt with past medical history significant for hypertension, chronic bradycardia, essential hypertension. Type of Study: Bedside Swallow Evaluation Diet Prior to this Study: NPO Temperature Spikes Noted: No Respiratory Status: Nasal cannula History of Recent Intubation: Yes Length of Intubations (days):  (procedure only) Behavior/Cognition: Alert;Cooperative;Pleasant mood Oral Cavity Assessment: Within Functional  Limits Oral Care Completed by SLP: No Oral Cavity - Dentition: Adequate natural dentition Vision: Functional for self-feeding Self-Feeding Abilities: Able to feed self Patient  Positioning: Upright in bed Baseline Vocal Quality: Normal Volitional Cough: Strong Volitional Swallow: Able to elicit    Oral/Motor/Sensory Function Overall Oral Motor/Sensory Function: Within functional limits Facial ROM: Within Functional Limits Facial Symmetry: Within Functional Limits Lingual ROM: Within Functional Limits Lingual Symmetry: Within Functional Limits Lingual Strength: Within Functional Limits Velum: Within Functional Limits Mandible: Within Functional Limits   Ice Chips Ice chips: Within functional limits   Thin Liquid Thin Liquid: Within functional limits    Nectar Thick Nectar Thick Liquid: Not tested   Honey Thick Honey Thick Liquid: Not tested   Puree Puree: Within functional limits   Solid     Solid: Within functional limits      Celedonio Savage, [place], [place] Office: [phone] 09/26/2022,12:32 PM

## 2022-09-26 NOTE — Progress Notes (Signed)
[facility] PROGRESS NOTE    Progress Note  Anna Weiss  QIO:[id number] DOB: 04-03-1925 DOA: 09/24/2022 PCP: Mayra Neer, MD     Brief Narrative:   Anna Weiss is an 88 y.o. female past medical history significant for hypertension, chronic bradycardia, essential hypertension sent to the ED after a fall imaging showed acute comminuted left intertrochanteric fracture, chest imaging showed possible patchy lower lobe infiltrates   Assessment/Plan:   Hip fracture, unspecified laterality, closed, initial encounter: Orthopedic surgery was consulted she is status post intramedullary nailing of left intertrochanteric on 09/25/2022.   Narcotics and anticoagulation per surgery. Continue MiraLAX p.o. twice daily. Physical therapy evaluation is pending.  Left lower lobe consolidation: On 2 L of oxygen satting 100% she has remained febrile no leukocytosis she denies any cough. Pneumonia has been ruled out is probably atelectasis. She has remained afebrile, will complete a 3-day course of biotics.  Sinus bradycardia: Appears to be chronic, she is on propranolol at home 10 mg twice a day. Probably contributing to her extreme bradycardia. Hold verapamil she probably does not need it.  Essential hypertension Blood pressures well controlled.  Left parietal scalp hematoma, initial encounter: Showed age-related atrophy remote lacunar infarcts no acute intracranial findings.  Incidental right middle lobe nodule: She will need to follow-up with PCP as an outpatient     DVT prophylaxis: lovenox Family Communication:none Status is: Inpatient Remains inpatient appropriate because: Acute hip fracture    Code Status:     Code Status Orders  (From admission, onward)           Start     Ordered   09/24/22 1719  Do not attempt resuscitation (DNR)  Continuous       Question Answer Comment  In the event of cardiac or respiratory ARREST Do not call a "code blue"   In the  event of cardiac or respiratory ARREST Do not perform Intubation, CPR, defibrillation or ACLS   In the event of cardiac or respiratory ARREST Use medication by any route, position, wound care, and other measures to relive pain and suffering. May use oxygen, suction and manual treatment of airway obstruction as needed for comfort.      09/24/22 1718           Code Status History     Date Active Date Inactive Code Status Order ID Comments User Context   09/24/2022 1647 09/24/2022 1718 Full Code [id number]  Lequita Halt, MD ED      Advance Directive Documentation    Flowsheet Row Most Recent Value  Type of Advance Directive Healthcare Power of [place], Out of facility DNR (pink MOST or yellow form)  Pre-existing out of facility DNR order (yellow form or pink MOST form) --  "MOST" Form in Place? --         IV Access:   Peripheral IV   Procedures and diagnostic studies:   DG FEMUR 1V LEFT  Result Date: 09/26/2022 CLINICAL DATA:  Postoperative state.  Frogleg lateral only. EXAM: LEFT FEMUR 1 VIEW COMPARISON:  Left femur radiographs 09/25/2022 FINDINGS: Redemonstration of cephalomedullary nail fixation of the previously seen intertrochanteric fracture. On his frogleg lateral view there is approximately 10-15 mm posterior displacement of the distal fracture component with respect to the proximal fracture component. Mild-to-moderate left femoroacetabular joint space narrowing. Mild peripheral degenerative osteophytes. Postoperative lateral hip and thigh subcutaneous air and surgical skin staples. Mild-to-moderate vascular calcifications. IMPRESSION: Redemonstration of cephalomedullary nail fixation of the previously seen intertrochanteric fracture.  There is mild posterior displacement of the distal fracture component with respect to the proximal fracture component. Electronically Signed   By: Yvonne Kendall M.D.   On: 09/26/2022 08:44   DG FEMUR MIN 2 VIEWS LEFT  Result Date:  09/25/2022 CLINICAL DATA:  [id number].  Postoperative following fracture fixation. EXAM: LEFT FEMUR 2 VIEWS COMPARISON:  Preoperative left hip films and CT yesterday. FINDINGS: Intertrochanteric left proximal femoral fracture has undergone open reduction since the previous exam. Post fracture fixation alignment approximates anatomic. There is a short intramedullary nail in the proximal femur secured by a single screw distally, with 2 dynamic compression screws extending through the nail into the neck and head of the femur. The lesser trochanter is separately fractured and medially displaced, unchanged. Degenerative arthrosis of the left hip, pubic symphysis, left SI joint and enthesopathic changes of the bony pelvis and greater trochanter are again shown as well as iliofemoral calcific arteriosclerosis. There are overlying skin staples laterally and patchy soft tissue gas. IMPRESSION: 1. Open reduction and internal fixation of the intertrochanteric left proximal femoral fracture, with near anatomic alignment. 2. Degenerative arthrosis of the left hip, pubic symphysis, left SI joint and left greater trochanter. Osteopenia. Electronically Signed   By: Telford Nab M.D.   On: 09/25/2022 22:07   DG FEMUR MIN 2 VIEWS LEFT  Result Date: 09/25/2022 CLINICAL DATA:  Fluoroscopic assistance for internal fixation EXAM: LEFT FEMUR 2 VIEWS COMPARISON:  09/24/2022 FINDINGS: Fluoroscopic images show reduction and internal fixation of intertrochanteric fracture of left femur with intramedullary rod. There is medial displacement of lesser trochanter with no significant interval change. Fluoroscopy time 2 minutes and 39 seconds. Radiation dose 36.33 mGy. IMPRESSION: Fluoroscopic assistance was provided for internal fixation of intertrochanteric fracture of left femur. Electronically Signed   By: Elmer Picker M.D.   On: 09/25/2022 20:39   DG C-Arm 1-60 Min-No Report  Result Date: 09/25/2022 Fluoroscopy was utilized by the  requesting physician.  No radiographic interpretation.   DG C-Arm 1-60 Min-No Report  Result Date: 09/25/2022 Fluoroscopy was utilized by the requesting physician.  No radiographic interpretation.   ECHOCARDIOGRAM COMPLETE  Result Date: 09/25/2022    ECHOCARDIOGRAM REPORT   Patient Name:   Anna Weiss Date of Exam: 09/25/2022 Medical Rec #:  [id number]     Height:       66.5 in Accession #:    [id number]    Weight:       156.0 lb Date of Birth:  June 25, 1925      BSA:          1.810 m Patient Age:    89 years      BP:           132/59 mmHg Patient Gender: F             HR:           44 bpm. Exam Location:  Inpatient Procedure: 2D Echo, Cardiac Doppler, Color Doppler and Intracardiac            Opacification Agent Indications:    Abnormal ECG R94.31  History:        Patient has no prior history of Echocardiogram examinations.                 Risk Factors:Hypertension and Non-Smoker.  Sonographer:    Greer Pickerel Referring Phys: [id number] Lequita Halt  Sonographer Comments: Suboptimal subcostal window. Image acquisition challenging due to respiratory motion. IMPRESSIONS  1. Left ventricular ejection  fraction, by estimation, is 60 to 65%. The left ventricle has normal function. The left ventricle has no regional wall motion abnormalities. There is mild concentric left ventricular hypertrophy. Left ventricular diastolic parameters were normal.  2. Right ventricular systolic function is mildly reduced. The right ventricular size is normal. Tricuspid regurgitation signal is inadequate for assessing PA pressure.  3. Left atrial size was severely dilated.  4. Right atrial size was severely dilated.  5. The mitral valve is degenerative. Mild mitral valve regurgitation. No evidence of mitral stenosis.  6. The aortic valve is tricuspid. Aortic valve regurgitation is trivial. Aortic valve sclerosis/calcification is present, without any evidence of aortic stenosis. Aortic valve mean gradient measures 7.3 mmHg. Aortic valve  Vmax measures 1.82 m/s.  7. The inferior vena cava is normal in size with greater than 50% respiratory variability, suggesting right atrial pressure of 3 mmHg. FINDINGS  Left Ventricle: Left ventricular ejection fraction, by estimation, is 60 to 65%. The left ventricle has normal function. The left ventricle has no regional wall motion abnormalities. Definity contrast agent was given IV to delineate the left ventricular  endocardial borders. The left ventricular internal cavity size was normal in size. There is mild concentric left ventricular hypertrophy. Left ventricular diastolic parameters were normal. Normal left ventricular filling pressure. Right Ventricle: The right ventricular size is normal. No increase in right ventricular wall thickness. Right ventricular systolic function is mildly reduced. Tricuspid regurgitation signal is inadequate for assessing PA pressure. Left Atrium: Left atrial size was severely dilated. Right Atrium: Right atrial size was severely dilated. Pericardium: There is no evidence of pericardial effusion. Mitral Valve: The mitral valve is degenerative in appearance. There is mild thickening of the mitral valve leaflet(s). There is mild calcification of the mitral valve leaflet(s). Mild mitral annular calcification. Mild mitral valve regurgitation. No evidence of mitral valve stenosis. Tricuspid Valve: The tricuspid valve is normal in structure. Tricuspid valve regurgitation is not demonstrated. No evidence of tricuspid stenosis. Aortic Valve: The aortic valve is tricuspid. Aortic valve regurgitation is trivial. Aortic valve sclerosis/calcification is present, without any evidence of aortic stenosis. Aortic valve mean gradient measures 7.3 mmHg. Aortic valve peak gradient measures 13.2 mmHg. Aortic valve area, by VTI measures 1.08 cm. Pulmonic Valve: The pulmonic valve was normal in structure. Pulmonic valve regurgitation is not visualized. No evidence of pulmonic stenosis. Aorta: The  aortic root is normal in size and structure. Venous: The inferior vena cava is normal in size with greater than 50% respiratory variability, suggesting right atrial pressure of 3 mmHg. IAS/Shunts: No atrial level shunt detected by color flow Doppler.  LEFT VENTRICLE PLAX 2D LVIDd:         4.90 cm   Diastology LVIDs:         3.50 cm   LV e' medial:    8.92 cm/s LV PW:         1.20 cm   LV E/e' medial:  15.0 LV IVS:        1.10 cm   LV e' lateral:   11.30 cm/s LVOT diam:     1.50 cm   LV E/e' lateral: 11.9 LV SV:         52 LV SV Index:   29 LVOT Area:     1.77 cm  RIGHT VENTRICLE RV S prime:     13.60 cm/s TAPSE (M-mode): 1.5 cm LEFT ATRIUM             Index  RIGHT ATRIUM           Index LA diam:        3.60 cm 1.99 cm/m   RA Area:     29.00 cm LA Vol (A2C):   87.1 ml 48.13 ml/m  RA Volume:   88.20 ml  48.74 ml/m LA Vol (A4C):   84.3 ml 46.59 ml/m LA Biplane Vol: 89.6 ml 49.52 ml/m  AORTIC VALVE AV Area (Vmax):    1.02 cm AV Area (Vmean):   1.01 cm AV Area (VTI):     1.08 cm AV Vmax:           181.67 cm/s AV Vmean:          123.000 cm/s AV VTI:            0.480 m AV Peak Grad:      13.2 mmHg AV Mean Grad:      7.3 mmHg LVOT Vmax:         105.00 cm/s LVOT Vmean:        70.400 cm/s LVOT VTI:          0.294 m LVOT/AV VTI ratio: 0.61  AORTA Ao Root diam: 3.20 cm Ao Asc diam:  3.50 cm MITRAL VALVE MV Area (PHT): 4.24 cm     SHUNTS MV Decel Time: 179 msec     Systemic VTI:  0.29 m MV E velocity: 134.00 cm/s  Systemic Diam: 1.50 cm MV A velocity: 62.30 cm/s MV E/A ratio:  2.15 Fransico Him MD Electronically signed by Fransico Him MD Signature Date/Time: 09/25/2022/2:23:59 PM    Final    CT CHEST WO CONTRAST  Result Date: 09/24/2022 CLINICAL DATA:  Pneumonia EXAM: CT CHEST WITHOUT CONTRAST TECHNIQUE: Multidetector CT imaging of the chest was performed following the standard protocol without IV contrast. RADIATION DOSE REDUCTION: This exam was performed according to the departmental dose-optimization  program which includes automated exposure control, adjustment of the mA and/or kV according to patient size and/or use of iterative reconstruction technique. COMPARISON:  Previous studies including the chest radiograph done earlier today FINDINGS: Cardiovascular: Heart is enlarged in size. Coronary artery calcifications are seen. Atherosclerotic plaques and calcifications are seen in thoracic aorta. There is ectasia of main pulmonary artery measuring 3.5 cm suggesting pulmonary arterial hypertension. Mediastinum/Nodes: No significant lymphadenopathy seen. Lungs/Pleura: In the image 75 of series 4, there is 2.3 x 1.9 cm lobulated noncalcified nodule in right middle lobe. In image 83, there is 9 x 6 mm noncalcified nodule in right middle lobe. There are linear densities in both apices. There are small patchy infiltrates in lingula and both lower lobes, more so on the left side. There is decreased volume in the left lower lobe. Small bilateral pleural effusions are seen. There is no pneumothorax. Upper Abdomen: There is high density in the lumen of stomach, possibly oral medication. Musculoskeletal: There is decrease in height of upper endplate of body of L1 vertebra. L1 vertebra is not included in its entirety. Fairly extensive arterial calcifications are seen in aorta and its major branches. IMPRESSION: There is 2.3 cm noncalcified nodule in right middle lobe. This lesion has to be considered primary malignant neoplasm until proven otherwise. Follow-up PET-CT and tissue sampling should be considered. There is 9 mm satellite nodule in right middle lobe suggesting possible metastatic disease. There are small patchy infiltrates in lingula and both lower lobes suggesting atelectasis/pneumonia. Small bilateral pleural effusions are seen. There is decrease in height of upper endplate of body of  L1 vertebra suggesting recent or old compression fracture. L1 vertebra is not included in its entirety limiting evaluation.  Cardiomegaly. Coronary artery disease. Aortic arteriosclerosis. There is ectasia of main pulmonary artery suggesting pulmonary arterial hypertension. Electronically Signed   By: Elmer Picker M.D.   On: 09/24/2022 19:27   CT Hip Left Wo Contrast  Result Date: 09/24/2022 CLINICAL DATA:  Left hip fracture EXAM: CT OF THE LEFT HIP WITHOUT CONTRAST TECHNIQUE: Multidetector CT imaging of the left hip was performed according to the standard protocol. Multiplanar CT image reconstructions were also generated. RADIATION DOSE REDUCTION: This exam was performed according to the departmental dose-optimization program which includes automated exposure control, adjustment of the mA and/or kV according to patient size and/or use of iterative reconstruction technique. COMPARISON:  09/24/2022 x-ray FINDINGS: Bones/Joint/Cartilage Acute comminuted intertrochanteric fracture of the proximal left femur mildly impacted with mild varus angulation. Lesser trochanteric fragment is slightly displaced medially. Greater trochanteric fragment is also mildly displaced posteriorly. Femoroacetabular joint alignment is maintained without dislocation. 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 Visualized portion of the left hemipelvis is intact. No lytic or sclerotic bone lesions. Ligaments Suboptimally assessed by CT. Muscles and Tendons Posttraumatic bony avulsions of the greater and lesser trochanters. Otherwise, no acute musculotendinous abnormality. Soft tissues Soft tissue swelling at the fracture site. No inguinal lymphadenopathy. Colonic diverticulosis. Atherosclerotic vascular calcifications. IMPRESSION: Acute comminuted intertrochanteric fracture of the proximal left femur. Electronically Signed   By: Davina Poke D.O.   On: 09/24/2022 14:43   DG FEMUR MIN 2 VIEWS LEFT  Result Date: 09/24/2022 CLINICAL DATA:  Left hip fracture, fall EXAM: LEFT FEMUR 2 VIEWS COMPARISON:  09/24/2022 FINDINGS: There is redemonstration of an  acute left hip intertrochanteric fracture with displacement and angulation. Distal aspect of the left femur intact. Visualized pelvis unremarkable. Peripheral vascular calcifications noted. IMPRESSION: Acute left hip intertrochanteric fracture. Electronically Signed   By: Jerilynn Mages.  Shick M.D.   On: 09/24/2022 14:27   DG Pelvis 1-2 Views  Result Date: 09/24/2022 CLINICAL DATA:  Fall, pain EXAM: PELVIS - 1-2 VIEW COMPARISON:  02/10/2022 FINDINGS: There is an acute displaced left hip intertrochanteric fracture. Bones are osteopenic. Bony pelvis and right hip appear intact. Degenerative changes noted of the lumbosacral spine, both SI joints as well as the hips. IMPRESSION: Acute displaced left hip intertrochanteric fracture. Electronically Signed   By: Jerilynn Mages.  Shick M.D.   On: 09/24/2022 14:25   DG Chest 1 View  Result Date: 09/24/2022 CLINICAL DATA:  Fall, pain EXAM: CHEST  1 VIEW COMPARISON:  None Available. FINDINGS: Left lower lung opacity obscures the left hemidiaphragm and left cardiac border concerning for left lower lobe collapse/consolidation as well as associated left effusion. Heart is enlarged. Right lung remains clear. Negative for pneumothorax. No edema pattern. Aorta atherosclerotic. Bones are osteopenic. IMPRESSION: Left lower lung opacity concerning for left lower lobe collapse/consolidation and associated left effusion. Cardiomegaly without CHF pattern Aortic Atherosclerosis (ICD10-I70.0). Electronically Signed   By: Jerilynn Mages.  Shick M.D.   On: 09/24/2022 14:24   DG Shoulder Left  Result Date: 09/24/2022 CLINICAL DATA:  Fall, pain EXAM: LEFT SHOULDER - 2+ VIEW COMPARISON:  None Available. FINDINGS: Slight degenerative changes of the left shoulder joint and AC joint. Bones are osteopenic. No acute osseous finding, fracture or malalignment. Included chest demonstrates aortic atherosclerosis. IMPRESSION: Osteopenia and degenerative changes. No acute finding by plain radiography. Electronically Signed   By: Jerilynn Mages.   Shick M.D.   On: 09/24/2022 14:22   CT HEAD WO CONTRAST  Result Date: 09/24/2022 CLINICAL DATA:  [place].  Hit head. EXAM: CT HEAD WITHOUT CONTRAST TECHNIQUE: Contiguous axial images were obtained from the base of the skull through the vertex without intravenous contrast. RADIATION DOSE REDUCTION: This exam was performed according to the departmental dose-optimization program which includes automated exposure control, adjustment of the mA and/or kV according to patient size and/or use of iterative reconstruction technique. COMPARISON:  None Available. FINDINGS: Brain: Age related cerebral atrophy, ventriculomegaly and periventricular white matter disease. Remote infarcts. No extra-axial fluid collections are identified. No CT findings for acute hemispheric infarction or intracranial hemorrhage. No mass lesions. The brainstem and cerebellum are normal. Vascular: Vascular calcifications but no aneurysm or hyperdense vessels. Skull: No acute skull fracture or bone lesions. Sinuses/Orbits: The paranasal sinuses and mastoid air cells are clear. The globes are intact. Other: No scalp lesions or scalp hematoma. IMPRESSION: 1. Age related cerebral atrophy, ventriculomegaly and periventricular white matter disease. 2. Remote cerebral infarctions. 3. No acute intracranial findings or skull fracture. Electronically Signed   By: Marijo Sanes M.D.   On: 09/24/2022 14:07   CT CERVICAL SPINE WO CONTRAST  Result Date: 09/24/2022 CLINICAL DATA:  Fall EXAM: CT CERVICAL SPINE WITHOUT CONTRAST TECHNIQUE: Multidetector CT imaging of the cervical spine was performed without intravenous contrast. Multiplanar CT image reconstructions were also generated. RADIATION DOSE REDUCTION: This exam was performed according to the departmental dose-optimization program which includes automated exposure control, adjustment of the mA and/or kV according to patient size and/or use of iterative reconstruction technique. COMPARISON:  Cervical  radiographs 04/30/2005 FINDINGS: Alignment: Mild anterolisthesis C5-6 Skull base and vertebrae: Negative for cervical spine fracture Soft tissues and spinal canal: Negative for soft tissue mass. Atherosclerotic calcification in the carotid bifurcation bilaterally. Disc levels: Cervical spondylosis, mild for age. Bilateral facet degeneration. Negative for spinal stenosis. Upper chest: Apical pleuroparenchymal scarring bilaterally Other: None IMPRESSION: Cervical spondylosis. Negative for fracture. Electronically Signed   By: Franchot Gallo M.D.   On: 09/24/2022 14:05     Medical Consultants:   None.   Subjective:    Anna Weiss pain is controlled she has had 2 bowel movements.  Objective:    Vitals:   09/25/22 2130 09/25/22 2145 09/25/22 2200 09/26/22 0440  BP: (!) 157/69 (!) 145/71 132/62 129/65  Pulse: 65 60 63 (!) 59  Resp:    14  Temp:   98 F (36.7 C)   TempSrc:   Oral   SpO2: 100%  100% 98%  Weight:      Height:       SpO2: 98 % O2 Flow Rate (L/min): 2 L/min   Intake/Output Summary (Last 24 hours) at 09/26/2022 0851 Last data filed at 09/25/2022 2100 Gross per 24 hour  Intake 1600 ml  Output 650 ml  Net 950 ml   Filed Weights   09/25/22 1730  Weight: 74.4 kg    Exam: General exam: In no acute distress. Respiratory system: Good air movement and clear to auscultation. Cardiovascular system: S1 & S2 heard, RRR. No JVD. Gastrointestinal system: Abdomen is nondistended, soft and nontender.  Extremities: No pedal edema. Skin: No rashes, lesions or ulcers Psychiatry: Judgement and insight appear normal. Mood & affect appropriate.   Data Reviewed:    Labs: Basic Metabolic Panel: Recent Labs  Lab 09/24/22 1341  NA 136  135  K 3.9  3.8  CL 100  96*  CO2 27  GLUCOSE 163*  166*  BUN 9  10  CREATININE 0.80  0.70  CALCIUM 9.5    GFR Estimated Creatinine Clearance: 43.4 mL/min (by C-G formula based on SCr of 0.8 mg/dL). Liver Function  Tests: Recent Labs  Lab 09/24/22 1341  AST 22  ALT 18  ALKPHOS 51  BILITOT 1.0  PROT 6.0*  ALBUMIN 3.8    No results for input(s): "LIPASE", "AMYLASE" in the last 168 hours. No results for input(s): "AMMONIA" in the last 168 hours. Coagulation profile Recent Labs  Lab 09/24/22 1341  INR 1.1    COVID-19 Labs  No results for input(s): "DDIMER", "FERRITIN", "LDH", "CRP" in the last 72 hours.  No results found for: "SARSCOV2NAA"  CBC: Recent Labs  Lab 09/24/22 1341  WBC 9.8  HGB 12.8  12.9  HCT 36.8  38.0  MCV 104.0*  PLT 213    Cardiac Enzymes: No results for input(s): "CKTOTAL", "CKMB", "CKMBINDEX", "TROPONINI" in the last 168 hours. BNP (last 3 results) No results for input(s): "PROBNP" in the last 8760 hours. CBG: No results for input(s): "GLUCAP" in the last 168 hours. D-Dimer: No results for input(s): "DDIMER" in the last 72 hours. Hgb A1c: No results for input(s): "HGBA1C" in the last 72 hours. Lipid Profile: No results for input(s): "CHOL", "HDL", "LDLCALC", "TRIG", "CHOLHDL", "LDLDIRECT" in the last 72 hours. Thyroid function studies: Recent Labs    09/24/22 1341  TSH 1.911    Anemia work up: No results for input(s): "VITAMINB12", "FOLATE", "FERRITIN", "TIBC", "IRON", "RETICCTPCT" in the last 72 hours. Sepsis Labs: Recent Labs  Lab 09/24/22 1339 09/24/22 1341  WBC  --  9.8  LATICACIDVEN 1.2  --     Microbiology Recent Results (from the past 240 hour(s))  MRSA Next Gen by PCR, Nasal     Status: None   Collection Time: 09/24/22 10:23 PM   Specimen: Nasal Mucosa; Nasal Swab  Result Value Ref Range Status   MRSA by PCR Next Gen NOT DETECTED NOT DETECTED Final    Comment: (NOTE) The GeneXpert MRSA Assay (FDA approved for NASAL specimens only), is one component of a comprehensive MRSA colonization surveillance program. It is not intended to diagnose MRSA infection nor to guide or monitor treatment for MRSA infections. Test performance  is not FDA approved in patients less than 27 years old. Performed at [facility], [place] [address], [place], [place] [address]      Medications:    feeding supplement  237 mL Oral BID BM   fentaNYL       fentaNYL       heparin  5,000 Units Subcutaneous Q12H   hydrochlorothiazide  25 mg Oral Daily   levothyroxine  50 mcg Oral Q0600   multivitamin with minerals  1 tablet Oral Daily   polyethylene glycol  17 g Oral BID   polyvinyl alcohol  1 drop Both Eyes BID   propranolol  10 mg Oral BID   simvastatin  10 mg Oral QPM   Continuous Infusions:  acetaminophen     azithromycin 500 mg (09/25/22 1010)   cefTRIAXone (ROCEPHIN)  IV 2 g (09/25/22 0845)      LOS: 2 days   Charlynne Cousins  [facility]  09/26/2022, 8:51 AM

## 2022-09-26 NOTE — Discharge Instructions (Signed)
Orthopedic Surgery Discharge Instructions  Patient name: Anna Weiss Procedure Performed: Left hip intramedullary nailing Date of Surgery: 09/25/2022 Surgeon: Ileene Rubens, MD  Pre-operative Diagnosis: Left hip intertrochanteric femur fracture Post-operative Diagnosis: Same as above  Activity: You are encouraged to walk as much as desired. You can put weight on your left leg as tolerated.   Incision Care: Your incision site has a dressing over it. That dressing should remain in place and dry at all times for a total of two weeks after surgery. Do not put cream or lotion over the surgical area. I plan on taking out the staples and taking off the dressings at your first office visit. Do not submerge the wound (e.g., take a bath, swim, go in a hot tub, etc.) until six weeks after surgery. There may be some bloody drainage from the incision into the dressing after surgery. This is normal. You do not need to replace the dressing. Continue to leave it in place for the two weeks as instructed above. Should the dressing become saturated with blood or drainage, please call the office for further instructions.   Medications: You have been prescribed oxycodone. This is a narcotic pain medication and should only be taken as prescribed. You should not drink alcohol or operate heavy machinery (including driving) while taking this medication. The oxycodone can cause constipation as a side effect. For that reason, you have been prescribed senna. This is a laxative. You do not need to take this medication if you develop diarrhea. Should you remain constipated even while taking the senna, please use over-the-counter miralax as instructed on the packaging to promote regular bowel movements.   You can use over-the-counter NSAIDs (ibuprofen, Aleve, Celebrex, naproxen, meloxicam, etc.) for additional pain relief after this surgery. These medications are safe to take with Tylenol. You should not take these medications  if you have or have had kidney problems or gastrointestinal ulcers. Take these medications as instructed on the packaging.   In order to set expectations for opioid prescriptions, you will only be prescribed opioids for a total of six weeks after surgery and, at two-weeks after surgery, your opioid prescription will start to tapered (decreased dosage and number of pills). If you have ongoing need for opioid medication six weeks after surgery, you will be referred to pain management. If you are already established with a provider that is giving you opioid medications, you should schedule an appointment with them for six weeks after surgery if you feel you are going to need another prescription. State law only allows for opioid prescriptions one week at a time. If you are running out of opioid medication near the end of the week, please call the office during business hours before running out so I can send you another prescription.   You have been prescribed Xarelto to prevent blood clots. Take this medication as prescribed.   Diet: You are safe to resume your regular diet after surgery.   Reasons to Call the Office After Surgery: You should feel free to call the office with any concerns or questions you have in the post-operative period, but you should definitely notify the office if you develop: -shortness of breath, chest pain, or trouble breathing -excessive bleeding, drainage, redness, or swelling around the surgical site -fevers, chills, or pain that is getting worse with each passing day -persistent nausea or vomiting -new weakness in any extremity, new or worsening numbness or tingling in any extremity -numbness in the groin, bowel or bladder incontinence -  other concerns about your surgery  Follow Up Appointments: You should have an office appointment scheduled for approximately two weeks after surgery. If you do not remember when this appointment is or do not already have it scheduled, please  call the office to schedule.   Office Information:  -Phone number: [phone] -Address: [address]       [place], [place] [address]

## 2022-09-26 NOTE — Progress Notes (Signed)
  Transition of Care [facility]) Screening Note   Patient Details  Name: Anna Weiss Date of Birth: Apr 02, 1925   Transition of [facility]) CM/SW Contact:    Pollie Friar, RN Phone Number: 09/26/2022, 4:16 PM   Pt is from home. She is s/p: INTRAMEDULLARY (IM) NAIL INTERTROCHANTERIC . Awaiting therapy evals Transition of Care Department [facility]) has reviewed patient. We will continue to monitor patient advancement through interdisciplinary progression rounds. If new patient transition needs arise, please place a TOC consult.

## 2022-09-27 DIAGNOSIS — S72002A Fracture of unspecified part of neck of left femur, initial encounter for closed fracture: Secondary | ICD-10-CM | POA: Diagnosis not present

## 2022-09-27 DIAGNOSIS — W19XXXA Unspecified fall, initial encounter: Secondary | ICD-10-CM | POA: Diagnosis not present

## 2022-09-27 MED ORDER — RIVAROXABAN 10 MG PO TABS: 10.0000 mg | ORAL_TABLET | Freq: Every day | ORAL | 0 refills | Status: DC

## 2022-09-27 MED ORDER — PANTOPRAZOLE SODIUM 40 MG PO TBEC
40.0000 mg | DELAYED_RELEASE_TABLET | Freq: Two times a day (BID) | ORAL | Status: DC
  Administered 2022-09-27 – 2022-09-28 (×3): 40 mg via ORAL
  Filled 2022-09-27 (×3): qty 1

## 2022-09-27 MED ORDER — SENNA 8.6 MG PO TABS: 1.0000 | ORAL_TABLET | Freq: Two times a day (BID) | ORAL | 0 refills | Status: DC

## 2022-09-27 MED ORDER — OXYCODONE HCL 5 MG PO TABS: 2.5000 mg | ORAL_TABLET | ORAL | 0 refills | Status: DC | PRN

## 2022-09-27 MED ORDER — CEFAZOLIN SODIUM-DEXTROSE 2-4 GM/100ML-% IV SOLN
2.0000 g | Freq: Three times a day (TID) | INTRAVENOUS | Status: AC
  Administered 2022-09-27: 2 g via INTRAVENOUS
  Filled 2022-09-27: qty 100

## 2022-09-27 MED ORDER — METHOCARBAMOL 500 MG PO TABS
500.0000 mg | ORAL_TABLET | Freq: Three times a day (TID) | ORAL | Status: DC | PRN
  Administered 2022-09-27 – 2022-09-28 (×2): 500 mg via ORAL
  Filled 2022-09-27 (×2): qty 1

## 2022-09-27 MED ORDER — ALUM & MAG HYDROXIDE-SIMETH 200-200-20 MG/5ML PO SUSP
30.0000 mL | ORAL | Status: DC | PRN
  Administered 2022-09-27: 30 mL via ORAL
  Filled 2022-09-27: qty 30

## 2022-09-27 NOTE — Progress Notes (Signed)
Inpatient Rehab Admissions:  Inpatient Rehab Consult received.  I met with patient at the bedside for rehabilitation assessment and to discuss goals and expectations of an inpatient rehab admission.  Discussed average length of stay and discharge home after completion of CIR. Pt acknowledged understanding. Pt asked that [facility] call daughter [name] to discuss CIR. Spoke with [name]. She also acknowledged understanding of CIR. She is supportive of pt pursuing CIR. She confirmed that she will be able to provide 24/7 support for pt after discharge. She is going to discuss CIR versus other rehab options with pt. Will continue to follow.  Signed: Gayland Curry, [place], [place] Admissions Coordinator [phone]

## 2022-09-27 NOTE — Progress Notes (Signed)
Held off propanolol 10 mg PO, HR 41, has ranged in the 40s this am.  Currently asymptomatic.  Dr. Olevia Bowens made aware.

## 2022-09-27 NOTE — Progress Notes (Signed)
Physical Therapy Treatment Patient Details Name: Anna Weiss MRN: [id number] DOB: [birth year] Today's Date: 09/27/2022   History of Present Illness Pt is 88 year old presented to [facility] on  09/24/22 after a fall at home with lt hip fx and underwent IM nail on 09/25/22 . PMH - chronic bradycardia/junctional rhythm, HTN, CVA 2003.    PT Comments    Pt received seated EOB in care of OT, agreeable to therapy session for transfer training and exercise instruction. Pt limited due to symptoms of nausea/lightheadedness while seated EOB and unable to progress to standing at EOB despite encouragement. Pt able to perform x2 lateral scoots toward [facility] with dense cues and maxA with bed pad assist and transfer back to supine with maxA +2. Pt instructed on IS and LE HEP and handout given to reinforce, pt/family encouraged to perform multiple times a day for strengthening and improved hemodynamics. Plan to trial transfers and pre-gait tasks in [place] next session if pt symptoms improve. Pt continues to benefit from PT services to progress toward functional mobility goals.   Recommendations for follow up therapy are one component of a multi-disciplinary discharge planning process, led by the attending physician.  Recommendations may be updated based on patient status, additional functional criteria and insurance authorization.  Follow Up Recommendations  Acute inpatient rehab (3hours/day)     Assistance Recommended at Discharge Frequent or constant Supervision/Assistance  Patient can return home with the following A lot of help with walking and/or transfers;A lot of help with bathing/dressing/bathroom;Assist for transportation;Help with stairs or ramp for entrance   Equipment Recommendations  Rolling walker (2 wheels);BSC/3in1;Wheelchair (measurements PT);Wheelchair cushion (measurements PT)    Recommendations for Other Services       Precautions / Restrictions Precautions Precautions: Fall;Other  (comment) Precaution Comments: monitor O2, orthostatics Restrictions Weight Bearing Restrictions: Yes LLE Weight Bearing: Weight bearing as tolerated     Mobility  Bed Mobility Overal bed mobility: Needs Assistance Bed Mobility: Sit to Supine       Sit to supine: Mod assist, +2 for safety/equipment   General bed mobility comments: cues for bedrail use, assist for LLE to EOB and to lower trunk    Transfers Overall transfer level: Needs assistance       Lateral/Scoot Transfers: Max assist, +2 safety/equipment General transfer comment: declined standing d/t dizziness and nausea; x2 lateral scoots toward HOB with maxA (+2 safety) and bed pad assist     Balance Overall balance assessment: Needs assistance Sitting-balance support: No upper extremity supported, Feet supported Sitting balance-Leahy Scale: Fair (static sitting) Sitting balance - Comments: pt needs BUE support for dynamic seated tasks                                    Cognition Arousal/Alertness: Awake/alert Behavior During Therapy: WFL for tasks assessed/performed Overall Cognitive Status: Within Functional Limits for tasks assessed                    General Comments: pt having some difficulty with teachback for IS technique.        Exercises Other Exercises Other Exercises: supine LLE AAROM: ankle pumps, heel slides, hip abduction, SLR, SAQ x5-10 reps ea (THA handout given to reinforce as family asking about exercises tomorrow-encouraged her to perform TID tomorrow as able) Other Exercises: IS x 10 reps 300-900 mL pt needs dense cues for technique    General Comments General comments (skin  integrity, edema, etc.): Bradycardia 50's bpm seated and down to high 40's bpm once resting in supine; SpO2 desat to 86% with transition from sitting to supine and needed 1L O2 [place] to improve >92%; Pt encouraged to use IS 10x/hr      Pertinent Vitals/Pain Pain Assessment Pain Assessment:  Faces Faces Pain Scale: Hurts little more Pain Location: L hip Pain Descriptors / Indicators: Grimacing, Guarding Pain Intervention(s): Monitored during session, Repositioned, Limited activity within patient's tolerance    Home Living Family/patient expects to be discharged to:: Private residence Living Arrangements: Alone (children spend the night sometimes) Available Help at Discharge: Family;Available 24 hours/day Type of Home: House Home Access: Stairs to enter Entrance Stairs-Rails: None Entrance Stairs-Number of Steps: 1   Home Layout: One level Home Equipment: Toilet riser;Cane - single point;Hand held shower head;Shower seat      Prior Function            PT Goals (current goals can now be found in the care plan section) Acute Rehab PT Goals Patient Stated Goal: return home PT Goal Formulation: With patient Time For Goal Achievement: 10/10/22 Progress towards PT goals: Progressing toward goals    Frequency    Min 3X/week      PT Plan Current plan remains appropriate       AM-PAC PT "6 Clicks" Mobility   Outcome Measure  Help needed turning from your back to your side while in a flat bed without using bedrails?: A Lot Help needed moving from lying on your back to sitting on the side of a flat bed without using bedrails?: A Lot Help needed moving to and from a bed to a chair (including a wheelchair)?: Total Help needed standing up from a chair using your arms (e.g., wheelchair or bedside chair)?: Total Help needed to walk in hospital room?: Total Help needed climbing 3-5 steps with a railing? : Total 6 Click Score: 8    End of Session Equipment Utilized During Treatment: Gait belt;Oxygen Activity Tolerance: Treatment limited secondary to medical complications (Comment);Other (comment) (symptoms of nausea with seated activity, pt unable to tolerate standing; bradycardia (chronic per pt) and desat with exertion;) Patient left: in bed;with call bell/phone  within reach;with bed alarm set;with family/visitor present;Other (comment) (BLE elevated and ice given for hip pain once back in supine) Nurse Communication: Mobility status;Other (comment) (pt too nauseated/lightheaded to stand) PT Visit Diagnosis: Other abnormalities of gait and mobility (R26.89);Muscle weakness (generalized) (M62.81);Pain Pain - Right/Left: Left Pain - part of body: Hip     Time: 8466-5993 PT Time Calculation (min) (ACUTE ONLY): 29 min  Charges:  $Therapeutic Exercise: 8-22 mins $Therapeutic Activity: 8-22 mins                     Aaron Boeh P., PTA Acute Rehabilitation Services Secure Chat Preferred 9a-5:30pm Office: [phone]    Kara Pacer [facility] 09/27/2022, 3:40 PM

## 2022-09-27 NOTE — PMR Pre-admission (Signed)
PMR Admission Coordinator Pre-Admission Assessment  Patient: Anna Weiss is an 88 y.o., female MRN: [id number] DOB: [birth year] Height: _0  (167.6 cm) Weight: 74.4 kg  Insurance Information HMO:     PPO:      PCP:      IPA:      80/20: yes     OTHER:  PRIMARY: Medicare A & B      Policy#: [id number]      Subscriber: patient CM Name:       Phone#:      Fax#:  Pre-Cert#:       Employer:  Benefits:  Phone #: verified eligibility via [place] on 09/27/22     Name:  Eff. Date: Part A & B effective 12/26/91      Deduct: $1,600      Out of Pocket Max: NA  Life Max: NA CIR: 100% coverage      SNF: 100% coverage for days 1-2, 80% coverage for days 21-100 Outpatient: 80% coverage     Co-Pay: 20% Home Health: 100% coverage      Co-Pay:  DME: 80% coverage     Co-Pay: 20% Providers: pt's choice SECONDARY: Generic Commercial     Policy#: [id number] ss     Phone#: [phone]  Financial Counselor:       Phone#:   The "Data Collection Information Summary" for patients in Inpatient Rehabilitation Facilities with attached "Privacy Act [place]  Records" was provided and verbally reviewed with: {CHL IP Patient Family OZ:[id number]}  Emergency Contact Information Contact Information     Name Relation Home Work Mobile   Anna Weiss Daughter [phone]  [phone]       Current Medical History  Patient Admitting Diagnosis: hip fx s/p IM Nail History of Present Illness: Pt is 88 year old female with medical hx significant for: HTN, stroke (2003), hyperlipidemia. Pt presented to [facility] on 09/24/22 after a fall at home. Pt hit the left side of her head and left hip; did not have any loss of consciousness. Developed nausea and vomiting on way to hospital. Pt noted to have left auricular hematoma, left shoulder contusion, and left hip pain with shortening of left leg. Left hip x-ray revealed acute comminuted intertrochanteric fx of proximal left femur.  Orthopedics was consulted.  Chest x-ray showed left lower lobe consolidation versus pleural effusion. Pt underwent IM Nail of left femur on 09/25/22. Therapy evaluations completed and CIR recommended d/t pt's deficits in functional mobility.     Patient's medical record from [facility] has been reviewed by the rehabilitation admission coordinator and physician.  Past Medical History  Past Medical History:  Diagnosis Date   High cholesterol    Hypertension    Stroke [facility])    2003   Tachycardia     Has the patient had major surgery during 100 days prior to admission? Yes  Family History   family history is not on file.  Current Medications  Current Facility-Administered Medications:    alum & mag hydroxide-simeth (MAALOX/MYLANTA) 200-200-20 MG/5ML suspension 30 mL, 30 mL, Oral, Q4H PRN, Charlynne Cousins, MD, 30 mL at 09/27/22 1157   bisacodyl (DULCOLAX) EC tablet 5 mg, 5 mg, Oral, Daily PRN, Wynetta Fines T, MD   feeding supplement (ENSURE ENLIVE / ENSURE PLUS) liquid 237 mL, 237 mL, Oral, BID BM, Charlynne Cousins, MD, 237 mL at 09/27/22 1024   heparin injection 5,000 Units, 5,000 Units, Subcutaneous, Q12H, Lequita Halt, MD, 5,000 Units at 09/27/22 [id number]  hydrALAZINE (APRESOLINE) tablet 25 mg, 25 mg, Oral, Q6H PRN, Wynetta Fines T, MD   hydrochlorothiazide (HYDRODIURIL) tablet 25 mg, 25 mg, Oral, Daily, Wynetta Fines T, MD, 25 mg at 09/27/22 0350   HYDROcodone-acetaminophen (NORCO/VICODIN) 5-325 MG per tablet 1-2 tablet, 1-2 tablet, Oral, Q6H PRN, Lequita Halt, MD, 1 tablet at 09/27/22 0938   HYDROmorphone (DILAUDID) injection 0.5 mg, 0.5 mg, Intravenous, Q2H PRN, Wynetta Fines T, MD, 0.5 mg at 09/26/22 0447   levothyroxine (SYNTHROID) tablet 50 mcg, 50 mcg, Oral, Q0600, Wynetta Fines T, MD, 50 mcg at 09/27/22 0551   metoprolol tartrate (LOPRESSOR) injection 2.5 mg, 2.5 mg, Intravenous, Q6H PRN, Wynetta Fines T, MD   multivitamin with minerals tablet 1 tablet, 1 tablet, Oral, Daily, Charlynne Cousins,  MD, 1 tablet at 09/27/22 0936   ondansetron (ZOFRAN-ODT) disintegrating tablet 4 mg, 4 mg, Oral, Q8H PRN, Wynetta Fines T, MD, 4 mg at 09/27/22 1158   pantoprazole (PROTONIX) EC tablet 40 mg, 40 mg, Oral, BID, Charlynne Cousins, MD, 40 mg at 09/27/22 1158   polyvinyl alcohol (LIQUIFILM TEARS) 1.4 % ophthalmic solution 1 drop, 1 drop, Both Eyes, BID, Wilson Singer I, RPH, 1 drop at 09/27/22 1024   senna-docusate (Senokot-S) tablet 1 tablet, 1 tablet, Oral, QHS PRN, Wynetta Fines T, MD   simvastatin (ZOCOR) tablet 10 mg, 10 mg, Oral, QPM, Wynetta Fines T, MD, 10 mg at 09/26/22 1654  Patients Current Diet:  Diet Order             Diet regular Room service appropriate? Yes; Fluid consistency: Thin  Diet effective now                   Precautions / Restrictions Precautions Precautions: Fall Restrictions Weight Bearing Restrictions: Yes LLE Weight Bearing: Weight bearing as tolerated   Has the patient had 2 or more falls or a fall with injury in the past year? Yes  Prior Activity Level Limited Community (1-2x/wk): gets out for medical appointments, hair appointments  Prior Functional Level Self Care: Did the patient need help bathing, dressing, using the toilet or eating? Independent  Indoor Mobility: Did the patient need assistance with walking from room to room (with or without device)? Independent  Stairs: Did the patient need assistance with internal or external stairs (with or without device)? Independent  Functional Cognition: Did the patient need help planning regular tasks such as shopping or remembering to take medications? Independent  Patient Information Are you of Hispanic, Latino/a,or Spanish origin?: A. No, not of Hispanic, Latino/a, or Spanish origin What is your race?: A. White Do you need or want an interpreter to communicate with a doctor or health care staff?: 0. No  Patient's Response To:  Health Literacy and Transportation Is the patient able to  respond to health literacy and transportation needs?: Yes Health Literacy - How often do you need to have someone help you when you read instructions, pamphlets, or other written material from your doctor or pharmacy?: Never In the past 12 months, has lack of transportation kept you from medical appointments or from getting medications?: No In the past 12 months, has lack of transportation kept you from meetings, work, or from getting things needed for daily living?: No  Home Assistive Devices / [place] Devices/Equipment: Radio producer (specify quad or straight) Home Equipment: Toilet riser, Cane - single point, Hand held shower head, Shower seat  Prior Device Use: Indicate devices/aids used by the patient prior to current illness, exacerbation or  injury?  cane  Current Functional Level Cognition  Overall Cognitive Status: Within Functional Limits for tasks assessed Orientation Level: Oriented X4    Extremity Assessment (includes Sensation/Coordination)  Upper Extremity Assessment: Defer to OT evaluation  Lower Extremity Assessment: Generalized weakness, LLE deficits/detail LLE Deficits / Details: Limited by pain of hip fx    ADLs       Mobility  Overal bed mobility: Needs Assistance Bed Mobility: Supine to Sit, Sit to Supine Supine to sit: +2 for physical assistance, Mod assist Sit to supine: +2 for physical assistance, Max assist General bed mobility comments: Assist to bring legs off of bed, elevate trunk into sitting, and bring hips to EOB. Assist to lower trunk and bring legs back up into bed    Transfers  Overall transfer level: Needs assistance Equipment used: Ambulation equipment used Transfers: Sit to/from Stand Sit to Stand: +2 physical assistance, Mod assist General transfer comment: Assist to power up and manual facilitation to extend hips and trunk in standing    Ambulation / Gait / Stairs / Wheelchair Mobility  Ambulation/Gait Pre-gait activities: Stood  x 2 for 30-45 sec each time with Stedy with +2 min assist to maintain    Posture / Balance Balance Overall balance assessment: Needs assistance Sitting-balance support: No upper extremity supported, Feet supported Sitting balance-Leahy Scale: Fair Standing balance support: Bilateral upper extremity supported, During functional activity, Reliant on assistive device for balance Standing balance-Leahy Scale: Poor Standing balance comment: Stedy and +2 min assist for static standing    Special needs/care consideration Skin Abrasion: ear, head/left; Ecchymosis; arm, hip/bilateral, left; Hematoma: temple/left; Surgical incision: hip/left and External urinary catheter   Previous Home Environment (from acute therapy documentation) Living Arrangements: Alone Available Help at Discharge: Family, Available 24 hours/day Type of Home: House Home Layout: One level Home Access: Stairs to enter Entrance Stairs-Rails: None Entrance Stairs-Number of Steps: 1 Bathroom Shower/Tub: Walk-in shower, Sponge bathes at baseline [employer]: Handicapped height Bathroom Accessibility: Yes How Accessible: Accessible via walker [place]: No  Discharge Living Setting Plans for Discharge Living Setting: Patient's home Type of Home at Discharge: House Discharge Home Layout: One level Discharge Home Access: Stairs to enter Entrance Stairs-Rails: None Entrance Stairs-Number of Steps: 1 Discharge Bathroom Shower/Tub: Walk-in shower Discharge Bathroom Toilet: Handicapped height Discharge Bathroom Accessibility: Yes How Accessible: Accessible via walker Does the patient have any problems obtaining your medications?: No  Social/Family/Support Systems Anticipated Caregiver: [name], daughter Anticipated Caregiver's Contact Information: [phone] Caregiver Availability: 24/7 Discharge Plan Discussed with Primary Caregiver: Yes Is Caregiver In Agreement with Plan?: Yes Does Caregiver/Family  have Issues with Lodging/Transportation while Pt is in Rehab?: No  Goals Patient/Family Goal for Rehab: *** Expected length of stay: *** Pt/Family Agrees to Admission and willing to participate: Yes Program Orientation Provided & Reviewed with Pt/Caregiver Including Roles  & Responsibilities: Yes  Decrease burden of Care through IP rehab admission: NA  Possible need for SNF placement upon discharge: Not anticipated  Patient Condition: I have reviewed medical records from [facility], spoken with {CHL IP CSW OA:[id number]}, and patient and daughter. I met with patient at the bedside and discussed via phone for inpatient rehabilitation assessment.  Patient will benefit from ongoing {CHL IP PT OT SWF:[id number]}, can actively participate in 3 hours of therapy a day 5 days of the week, and can make measurable gains during the admission.  Patient will also benefit from the coordinated team approach during an Inpatient Acute Rehabilitation admission.  The patient  will receive intensive therapy as well as Rehabilitation physician, nursing, social worker, and care management interventions.  Due to bladder management, safety, skin/wound care, disease management, medication administration, pain management, and patient education the patient requires 24 hour a day rehabilitation nursing.  The patient is currently *** with mobility and basic ADLs.  Discharge setting and therapy post discharge at home with home health is anticipated.  Patient has agreed to participate in the Acute Inpatient Rehabilitation Program and will admit {Time; today/tomorrow:10263}.  Preadmission Screen Completed By:  Bethel Born, 09/27/2022 1:35 PM ______________________________________________________________________   Discussed status with Dr. Marland Kitchen on *** at *** and received approval for admission today.  Admission Coordinator:  Bethel Born, CCC-SLP, time ***/Date ***   Assessment/Plan: Diagnosis: Does  the need for close, 24 hr/day Medical supervision in concert with the patient's rehab needs make it unreasonable for this patient to be served in a less intensive setting? {yes_no_potentially:3041433} Co-Morbidities requiring supervision/potential complications: *** Due to {due GN:5621308}, does the patient require 24 hr/day rehab nursing? {yes_no_potentially:3041433} Does the patient require coordinated care of a physician, rehab nurse, PT, OT, and SLP to address physical and functional deficits in the context of the above medical diagnosis(es)? {yes_no_potentially:3041433} Addressing deficits in the following areas: {deficits:3041436} Can the patient actively participate in an intensive therapy program of at least 3 hrs of therapy 5 days a week? {yes_no_potentially:3041433} The potential for patient to make measurable gains while on inpatient rehab is {potential:3041437} Anticipated functional outcomes upon discharge from inpatient rehab: {functional outcomes:304600100} PT, {functional outcomes:304600100} OT, {functional outcomes:304600100} SLP Estimated rehab length of stay to reach the above functional goals is: *** Anticipated discharge destination: {anticipated dc setting:21604} 10. Overall Rehab/Functional Prognosis: {potential:3041437}   MD Signature: ***

## 2022-09-27 NOTE — Progress Notes (Addendum)
[facility] PROGRESS NOTE    Progress Note  Anna Weiss  WCB:[id number] DOB: Oct 06, 1925 DOA: 09/24/2022 PCP: Mayra Neer, MD     Brief Narrative:   Anna Weiss is an 88 y.o. female past medical history significant for hypertension, chronic bradycardia, essential hypertension sent to the ED after a fall imaging showed acute comminuted left intertrochanteric fracture, chest imaging showed possible patchy lower lobe infiltrates. Medically stable for transfer to rehab facility   Assessment/Plan:   Hip fracture, unspecified laterality, closed, initial encounter: Orthopedic surgery was consulted she is status post intramedullary nailing of left intertrochanteric on 09/25/2022.   Narcotics and anticoagulation per surgery. Continue MiraLAX p.o. twice daily. Physical therapy evaluated the patient, she will need inpatient rehab.  Left lower lobe consolidation: On 2 L of oxygen satting 100% she has remained febrile no leukocytosis she denies any cough. Pneumonia has been ruled out is probably atelectasis. Has complete 3-day course. Wean to room air.  Sinus bradycardia: Appears to be chronic, she is on propranolol at home 10 mg twice a day. Probably contributing to her extreme bradycardia. Hold verapamil she probably does not need it.  Essential hypertension Well-controlled current regimen.  Left parietal scalp hematoma, initial encounter: Showed age-related atrophy remote lacunar infarcts no acute intracranial findings.  Incidental right middle lobe nodule: She will need to follow-up with PCP as an outpatient     DVT prophylaxis: lovenox Family Communication:none Status is: Inpatient Remains inpatient appropriate because: Acute hip fracture    Code Status:     Code Status Orders  (From admission, onward)           Start     Ordered   09/24/22 1719  Do not attempt resuscitation (DNR)  Continuous       Question Answer Comment  In the event of cardiac  or respiratory ARREST Do not call a "code blue"   In the event of cardiac or respiratory ARREST Do not perform Intubation, CPR, defibrillation or ACLS   In the event of cardiac or respiratory ARREST Use medication by any route, position, wound care, and other measures to relive pain and suffering. May use oxygen, suction and manual treatment of airway obstruction as needed for comfort.      09/24/22 1718           Code Status History     Date Active Date Inactive Code Status Order ID Comments User Context   09/24/2022 1647 09/24/2022 1718 Full Code [id number]  Lequita Halt, MD ED      Advance Directive Documentation    Flowsheet Row Most Recent Value  Type of Advance Directive Healthcare Power of [place], Out of facility DNR (pink MOST or yellow form)  Pre-existing out of facility DNR order (yellow form or pink MOST form) --  "MOST" Form in Place? --         IV Access:   Peripheral IV   Procedures and diagnostic studies:   DG FEMUR 1V LEFT  Result Date: 09/26/2022 CLINICAL DATA:  Postoperative state.  Frogleg lateral only. EXAM: LEFT FEMUR 1 VIEW COMPARISON:  Left femur radiographs 09/25/2022 FINDINGS: Redemonstration of cephalomedullary nail fixation of the previously seen intertrochanteric fracture. On his frogleg lateral view there is approximately 10-15 mm posterior displacement of the distal fracture component with respect to the proximal fracture component. Mild-to-moderate left femoroacetabular joint space narrowing. Mild peripheral degenerative osteophytes. Postoperative lateral hip and thigh subcutaneous air and surgical skin staples. Mild-to-moderate vascular calcifications. IMPRESSION: Redemonstration of cephalomedullary  nail fixation of the previously seen intertrochanteric fracture. There is mild posterior displacement of the distal fracture component with respect to the proximal fracture component. Electronically Signed   By: Yvonne Kendall M.D.   On: 09/26/2022  08:44   DG FEMUR MIN 2 VIEWS LEFT  Result Date: 09/25/2022 CLINICAL DATA:  [id number].  Postoperative following fracture fixation. EXAM: LEFT FEMUR 2 VIEWS COMPARISON:  Preoperative left hip films and CT yesterday. FINDINGS: Intertrochanteric left proximal femoral fracture has undergone open reduction since the previous exam. Post fracture fixation alignment approximates anatomic. There is a short intramedullary nail in the proximal femur secured by a single screw distally, with 2 dynamic compression screws extending through the nail into the neck and head of the femur. The lesser trochanter is separately fractured and medially displaced, unchanged. Degenerative arthrosis of the left hip, pubic symphysis, left SI joint and enthesopathic changes of the bony pelvis and greater trochanter are again shown as well as iliofemoral calcific arteriosclerosis. There are overlying skin staples laterally and patchy soft tissue gas. IMPRESSION: 1. Open reduction and internal fixation of the intertrochanteric left proximal femoral fracture, with near anatomic alignment. 2. Degenerative arthrosis of the left hip, pubic symphysis, left SI joint and left greater trochanter. Osteopenia. Electronically Signed   By: Telford Nab M.D.   On: 09/25/2022 22:07   DG FEMUR MIN 2 VIEWS LEFT  Result Date: 09/25/2022 CLINICAL DATA:  Fluoroscopic assistance for internal fixation EXAM: LEFT FEMUR 2 VIEWS COMPARISON:  09/24/2022 FINDINGS: Fluoroscopic images show reduction and internal fixation of intertrochanteric fracture of left femur with intramedullary rod. There is medial displacement of lesser trochanter with no significant interval change. Fluoroscopy time 2 minutes and 39 seconds. Radiation dose 36.33 mGy. IMPRESSION: Fluoroscopic assistance was provided for internal fixation of intertrochanteric fracture of left femur. Electronically Signed   By: Elmer Picker M.D.   On: 09/25/2022 20:39   DG C-Arm 1-60 Min-No  Report  Result Date: 09/25/2022 Fluoroscopy was utilized by the requesting physician.  No radiographic interpretation.   DG C-Arm 1-60 Min-No Report  Result Date: 09/25/2022 Fluoroscopy was utilized by the requesting physician.  No radiographic interpretation.   ECHOCARDIOGRAM COMPLETE  Result Date: 09/25/2022    ECHOCARDIOGRAM REPORT   Patient Name:   ALAYNE ESTRELLA Date of Exam: 09/25/2022 Medical Rec #:  [id number]     Height:       66.5 in Accession #:    [id number]    Weight:       156.0 lb Date of Birth:  1924-12-24      BSA:          1.810 m Patient Age:    89 years      BP:           132/59 mmHg Patient Gender: F             HR:           44 bpm. Exam Location:  Inpatient Procedure: 2D Echo, Cardiac Doppler, Color Doppler and Intracardiac            Opacification Agent Indications:    Abnormal ECG R94.31  History:        Patient has no prior history of Echocardiogram examinations.                 Risk Factors:Hypertension and Non-Smoker.  Sonographer:    Greer Pickerel Referring Phys: [id number] Lequita Halt  Sonographer Comments: Suboptimal subcostal window. Image acquisition challenging due to  respiratory motion. IMPRESSIONS  1. Left ventricular ejection fraction, by estimation, is 60 to 65%. The left ventricle has normal function. The left ventricle has no regional wall motion abnormalities. There is mild concentric left ventricular hypertrophy. Left ventricular diastolic parameters were normal.  2. Right ventricular systolic function is mildly reduced. The right ventricular size is normal. Tricuspid regurgitation signal is inadequate for assessing PA pressure.  3. Left atrial size was severely dilated.  4. Right atrial size was severely dilated.  5. The mitral valve is degenerative. Mild mitral valve regurgitation. No evidence of mitral stenosis.  6. The aortic valve is tricuspid. Aortic valve regurgitation is trivial. Aortic valve sclerosis/calcification is present, without any evidence of aortic  stenosis. Aortic valve mean gradient measures 7.3 mmHg. Aortic valve Vmax measures 1.82 m/s.  7. The inferior vena cava is normal in size with greater than 50% respiratory variability, suggesting right atrial pressure of 3 mmHg. FINDINGS  Left Ventricle: Left ventricular ejection fraction, by estimation, is 60 to 65%. The left ventricle has normal function. The left ventricle has no regional wall motion abnormalities. Definity contrast agent was given IV to delineate the left ventricular  endocardial borders. The left ventricular internal cavity size was normal in size. There is mild concentric left ventricular hypertrophy. Left ventricular diastolic parameters were normal. Normal left ventricular filling pressure. Right Ventricle: The right ventricular size is normal. No increase in right ventricular wall thickness. Right ventricular systolic function is mildly reduced. Tricuspid regurgitation signal is inadequate for assessing PA pressure. Left Atrium: Left atrial size was severely dilated. Right Atrium: Right atrial size was severely dilated. Pericardium: There is no evidence of pericardial effusion. Mitral Valve: The mitral valve is degenerative in appearance. There is mild thickening of the mitral valve leaflet(s). There is mild calcification of the mitral valve leaflet(s). Mild mitral annular calcification. Mild mitral valve regurgitation. No evidence of mitral valve stenosis. Tricuspid Valve: The tricuspid valve is normal in structure. Tricuspid valve regurgitation is not demonstrated. No evidence of tricuspid stenosis. Aortic Valve: The aortic valve is tricuspid. Aortic valve regurgitation is trivial. Aortic valve sclerosis/calcification is present, without any evidence of aortic stenosis. Aortic valve mean gradient measures 7.3 mmHg. Aortic valve peak gradient measures 13.2 mmHg. Aortic valve area, by VTI measures 1.08 cm. Pulmonic Valve: The pulmonic valve was normal in structure. Pulmonic valve  regurgitation is not visualized. No evidence of pulmonic stenosis. Aorta: The aortic root is normal in size and structure. Venous: The inferior vena cava is normal in size with greater than 50% respiratory variability, suggesting right atrial pressure of 3 mmHg. IAS/Shunts: No atrial level shunt detected by color flow Doppler.  LEFT VENTRICLE PLAX 2D LVIDd:         4.90 cm   Diastology LVIDs:         3.50 cm   LV e' medial:    8.92 cm/s LV PW:         1.20 cm   LV E/e' medial:  15.0 LV IVS:        1.10 cm   LV e' lateral:   11.30 cm/s LVOT diam:     1.50 cm   LV E/e' lateral: 11.9 LV SV:         52 LV SV Index:   29 LVOT Area:     1.77 cm  RIGHT VENTRICLE RV S prime:     13.60 cm/s TAPSE (M-mode): 1.5 cm LEFT ATRIUM  Index        RIGHT ATRIUM           Index LA diam:        3.60 cm 1.99 cm/m   RA Area:     29.00 cm LA Vol (A2C):   87.1 ml 48.13 ml/m  RA Volume:   88.20 ml  48.74 ml/m LA Vol (A4C):   84.3 ml 46.59 ml/m LA Biplane Vol: 89.6 ml 49.52 ml/m  AORTIC VALVE AV Area (Vmax):    1.02 cm AV Area (Vmean):   1.01 cm AV Area (VTI):     1.08 cm AV Vmax:           181.67 cm/s AV Vmean:          123.000 cm/s AV VTI:            0.480 m AV Peak Grad:      13.2 mmHg AV Mean Grad:      7.3 mmHg LVOT Vmax:         105.00 cm/s LVOT Vmean:        70.400 cm/s LVOT VTI:          0.294 m LVOT/AV VTI ratio: 0.61  AORTA Ao Root diam: 3.20 cm Ao Asc diam:  3.50 cm MITRAL VALVE MV Area (PHT): 4.24 cm     SHUNTS MV Decel Time: 179 msec     Systemic VTI:  0.29 m MV E velocity: 134.00 cm/s  Systemic Diam: 1.50 cm MV A velocity: 62.30 cm/s MV E/A ratio:  2.15 Fransico Him MD Electronically signed by Fransico Him MD Signature Date/Time: 09/25/2022/2:23:59 PM    Final      Medical Consultants:   None.   Subjective:    Anna Weiss pain is controlled she has had several bowel movements.  Objective:    Vitals:   09/26/22 1404 09/26/22 2017 09/26/22 2018 09/27/22 0808  BP: (!) 133/49 118/74  (!)  139/43  Pulse: (!) 50 (!) 54  (!) 52  Resp: '16 15  16  '$ Temp: 98.6 F (37 C)   98 F (36.7 C)  TempSrc: Oral   Oral  SpO2: 99% 100% 98% 100%  Weight:      Height:       SpO2: 100 % O2 Flow Rate (L/min): 2 L/min   Intake/Output Summary (Last 24 hours) at 09/27/2022 0824 Last data filed at 09/26/2022 1700 Gross per 24 hour  Intake 240 ml  Output --  Net 240 ml    Filed Weights   09/25/22 1730  Weight: 74.4 kg    Exam: General exam: In no acute distress. Respiratory system: Good air movement and clear to auscultation. Cardiovascular system: S1 & S2 heard, RRR. No JVD. Gastrointestinal system: Abdomen is nondistended, soft and nontender.  Extremities: No pedal edema. Skin: No rashes, lesions or ulcers Psychiatry: Judgement and insight appear normal. Mood & affect appropriate.  Data Reviewed:    Labs: Basic Metabolic Panel: Recent Labs  Lab 09/24/22 1341  NA 136  135  K 3.9  3.8  CL 100  96*  CO2 27  GLUCOSE 163*  166*  BUN 9  10  CREATININE 0.80  0.70  CALCIUM 9.5    GFR Estimated Creatinine Clearance: 43.4 mL/min (by C-G formula based on SCr of 0.8 mg/dL). Liver Function Tests: Recent Labs  Lab 09/24/22 1341  AST 22  ALT 18  ALKPHOS 51  BILITOT 1.0  PROT 6.0*  ALBUMIN 3.8  No results for input(s): "LIPASE", "AMYLASE" in the last 168 hours. No results for input(s): "AMMONIA" in the last 168 hours. Coagulation profile Recent Labs  Lab 09/24/22 1341  INR 1.1    COVID-19 Labs  No results for input(s): "DDIMER", "FERRITIN", "LDH", "CRP" in the last 72 hours.  No results found for: "SARSCOV2NAA"  CBC: Recent Labs  Lab 09/24/22 1341  WBC 9.8  HGB 12.8  12.9  HCT 36.8  38.0  MCV 104.0*  PLT 213    Cardiac Enzymes: No results for input(s): "CKTOTAL", "CKMB", "CKMBINDEX", "TROPONINI" in the last 168 hours. BNP (last 3 results) No results for input(s): "PROBNP" in the last 8760 hours. CBG: No results for input(s): "GLUCAP"  in the last 168 hours. D-Dimer: No results for input(s): "DDIMER" in the last 72 hours. Hgb A1c: No results for input(s): "HGBA1C" in the last 72 hours. Lipid Profile: No results for input(s): "CHOL", "HDL", "LDLCALC", "TRIG", "CHOLHDL", "LDLDIRECT" in the last 72 hours. Thyroid function studies: Recent Labs    09/24/22 1341  TSH 1.911    Anemia work up: No results for input(s): "VITAMINB12", "FOLATE", "FERRITIN", "TIBC", "IRON", "RETICCTPCT" in the last 72 hours. Sepsis Labs: Recent Labs  Lab 09/24/22 1339 09/24/22 1341  WBC  --  9.8  LATICACIDVEN 1.2  --     Microbiology Recent Results (from the past 240 hour(s))  MRSA Next Gen by PCR, Nasal     Status: None   Collection Time: 09/24/22 10:23 PM   Specimen: Nasal Mucosa; Nasal Swab  Result Value Ref Range Status   MRSA by PCR Next Gen NOT DETECTED NOT DETECTED Final    Comment: (NOTE) The GeneXpert MRSA Assay (FDA approved for NASAL specimens only), is one component of a comprehensive MRSA colonization surveillance program. It is not intended to diagnose MRSA infection nor to guide or monitor treatment for MRSA infections. Test performance is not FDA approved in patients less than 33 years old. Performed at [facility], [place] [address] [place], [place] [address]      Medications:    feeding supplement  237 mL Oral BID BM   heparin  5,000 Units Subcutaneous Q12H   hydrochlorothiazide  25 mg Oral Daily   levothyroxine  50 mcg Oral Q0600   multivitamin with minerals  1 tablet Oral Daily   polyvinyl alcohol  1 drop Both Eyes BID   propranolol  10 mg Oral BID   simvastatin  10 mg Oral QPM   Continuous Infusions:     LOS: 3 days   Charlynne Cousins  [facility]  09/27/2022, 8:24 AM

## 2022-09-27 NOTE — Progress Notes (Signed)
OT Cancellation Note  Patient Details Name: Anna Weiss MRN: [id number] DOB: 1925/06/22   Cancelled Treatment:    Reason Eval/Treat Not Completed: Other (comment) Pt currently w/ lunch tray. Will follow up as schedule permits.  Layla Maw 09/27/2022, 12:47 PM

## 2022-09-27 NOTE — Progress Notes (Signed)
Transition of Care [facility]) - CAGE-AID Screening   Patient Details  Name: Anna Weiss MRN: [id number] Date of Birth: 05-May-1925     Elvina Sidle, RN Trauma Response Nurse Phone Number: [phone] 09/27/2022, 4:15 PM    CAGE-AID Screening:    Have You Ever Felt You Ought to Cut Down on Your Drinking or Drug Use?: No Have People Annoyed You By Critizing Your Drinking Or Drug Use?: No Have You Felt Bad Or Guilty About Your Drinking Or Drug Use?: No Have You Ever Had a Drink or Used Drugs First Thing In The Morning to Steady Your Nerves or to Get Rid of a Hangover?: No CAGE-AID Score: 0  Substance Abuse Education Offered: No

## 2022-09-27 NOTE — Progress Notes (Signed)
Frequent requests for bedpan- with dribbling results- bladder scan performed noted 237 ml- passed in report to dayshift results.

## 2022-09-27 NOTE — Progress Notes (Signed)
Orthopedic Surgery Progress Note   Assessment: Patient is a 88 y.o. female with left intertrochanteric femur fracture status post IMN   Plan: -Operative plans: complete -Diet: regular -Weight bearing status: weight bearing as tolerated left lower extremity -PT/OT evaluate and treat -Pain control -Xarelto, senna, and oxycodone written prescriptions placed in chart for discharge -Will perform dressing change tomorrow -Dispo: per primary  ___________________________________________________________________________  Subjective: No acute events overnight. Not having much hip pain this morning. Was able to transfer yesterday. Denies paresthesias and numbness.   Talked about music. She enjoyed opera and dance bands years ago. 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    Physical Exam:  General: no acute distress, appears stated age Neurologic: alert, answering questions appropriately, following commands Respiratory: unlabored breathing on room air, symmetric chest rise Psychiatric: appropriate affect, normal cadence to speech  MSK:   -Left lower extremity  Dressing with moderate amount of blood otherwise c/d/i Fires quadriceps, hamstrings, tibialis anterior, gastrocnemius and soleus, extensor hallucis longus Plantarflexes and dorsiflexes toes Sensation intact to light touch in sural, saphenous, tibial, deep peroneal, and superficial peroneal nerve distributions Foot warm and well perfused, palpable DP pulse   Patient name: Anna Weiss Patient MRN: [id number] Date: 09/27/22

## 2022-09-27 NOTE — Evaluation (Addendum)
Occupational Therapy Evaluation Patient Details Name: Anna Weiss MRN: [id number] DOB: 1925-08-04 Today's Date: 09/27/2022   History of Present Illness Pt is 88 year old presented to [facility] on  09/24/22 after a fall at home with lt hip fx and underwent IM nail on 09/25/22 . PMH - chronic bradycardia/junctional rhythm, HTN, CVA 2003.   Clinical Impression   PTA, pt lives alone, typically Independent with ADLs, IADLs and mobility. Pt presents now with deficits in pain, strength, endurance and balance. Pt requires Mod A for bed mobility though d/t onset of dizziness and nausea sitting EOB, pt declined to attempt further standing tasks. Pt requires Min A for UB ADL and up to Total A for LB ADLs d/t current deficits. Based on higher PLOF and potential to progress, rec AIR level therapies pending progression with OOB activities.    BP supine: 105/70 BP sitting EOB: 113/76 BP with sitting EOB > 5 min: 131/55     Recommendations for follow up therapy are one component of a multi-disciplinary discharge planning process, led by the attending physician.  Recommendations may be updated based on patient status, additional functional criteria and insurance authorization.   Follow Up Recommendations  Acute inpatient rehab (3hours/day)    Assistance Recommended at Discharge Frequent or constant Supervision/Assistance  Patient can return home with the following A lot of help with walking and/or transfers;A lot of help with bathing/dressing/bathroom    Functional Status Assessment  Patient has had a recent decline in their functional status and demonstrates the ability to make significant improvements in function in a reasonable and predictable amount of time.  Equipment Recommendations  Other (comment);BSC/3in1 (RW)    Recommendations for Other Services       Precautions / Restrictions Precautions Precautions: Fall;Other (comment) Precaution Comments: monitor O2, orthostatics Restrictions Weight  Bearing Restrictions: Yes LLE Weight Bearing: Weight bearing as tolerated      Mobility Bed Mobility Overal bed mobility: Needs Assistance Bed Mobility: Supine to Sit     Supine to sit: Mod assist, HOB elevated     General bed mobility comments: cues for bedrail use, assist for LLE to EOB and to lift trunk    Transfers                   General transfer comment: declined d/t dizziness and nausea      Balance Overall balance assessment: Needs assistance Sitting-balance support: No upper extremity supported, Feet supported Sitting balance-Leahy Scale: Fair                                     ADL either performed or assessed with clinical judgement   ADL Overall ADL's : Needs assistance/impaired Eating/Feeding: Set up   Grooming: Set up;Sitting   Upper Body Bathing: Minimal assistance;Sitting   Lower Body Bathing: Maximal assistance;Sitting/lateral leans   Upper Body Dressing : Set up;Sitting   Lower Body Dressing: Maximal assistance;Sitting/lateral leans       Toileting- Clothing Manipulation and Hygiene: Total assistance;Bed level         General ADL Comments: Limited by dizziness/nausea more so than pain today but unable to progress OOB d/t this     Vision Ability to See in Adequate Light: 0 Adequate Patient Visual Report: No change from baseline       Perception     Praxis      Pertinent Vitals/Pain Pain Assessment Pain Assessment: Faces Faces  Pain Scale: Hurts little more Pain Location: lt hip Pain Descriptors / Indicators: Grimacing, Guarding Pain Intervention(s): Monitored during session     Hand Dominance Right   Extremity/Trunk Assessment Upper Extremity Assessment Upper Extremity Assessment: Generalized weakness   Lower Extremity Assessment Lower Extremity Assessment: Defer to PT evaluation   Cervical / Trunk Assessment Cervical / Trunk Assessment: Normal   Communication Communication Communication: No  difficulties   Cognition Arousal/Alertness: Awake/alert Behavior During Therapy: WFL for tasks assessed/performed Overall Cognitive Status: Within Functional Limits for tasks assessed                                       General Comments       Exercises     Shoulder Instructions      Home Living Family/patient expects to be discharged to:: Private residence Living Arrangements: Alone (children spend the night sometimes) Available Help at Discharge: Family;Available 24 hours/day Type of Home: House Home Access: Stairs to enter [employer] of Steps: 1 Entrance Stairs-Rails: None Home Layout: One level     Bathroom Shower/Tub: Walk-in shower;Sponge bathes at baseline   [employer]: Handicapped height Bathroom Accessibility: Yes How Accessible: Accessible via walker Home Equipment: Toilet riser;Cane - single point;Hand held shower head;Shower seat          Prior Functioning/Environment Prior Level of Function : Independent/Modified Independent             Mobility Comments: Uses cane for mobility. ADLs Comments: Has been sponge bathing more recently, likes to cook        OT Problem List: Decreased strength;Decreased activity tolerance;Impaired balance (sitting and/or standing);Decreased knowledge of use of DME or AE;Decreased knowledge of precautions      OT Treatment/Interventions: Therapeutic exercise;Self-care/ADL training;Energy conservation;DME and/or AE instruction;Therapeutic activities    OT Goals(Current goals can be found in the care plan section) Acute Rehab OT Goals Patient Stated Goal: feel less nauseous OT Goal Formulation: With patient Time For Goal Achievement: 10/11/22 Potential to Achieve Goals: Good  OT Frequency: Min 2X/week    Co-evaluation              AM-PAC OT "6 Clicks" Daily Activity     Outcome Measure Help from another person eating meals?: A Little Help from another person taking care of  personal grooming?: A Little Help from another person toileting, which includes using toliet, bedpan, or urinal?: Total Help from another person bathing (including washing, rinsing, drying)?: A Lot Help from another person to put on and taking off regular upper body clothing?: A Little Help from another person to put on and taking off regular lower body clothing?: A Lot 6 Click Score: 14   End of Session Equipment Utilized During Treatment: Gait belt;Oxygen Nurse Communication: Mobility status  Activity Tolerance: Treatment limited secondary to medical complications (Comment) Patient left: in bed;Other (comment) (EOB with PT)  OT Visit Diagnosis: Unsteadiness on feet (R26.81);Other abnormalities of gait and mobility (R26.89);Muscle weakness (generalized) (M62.81)                Time: 1610-9604 OT Time Calculation (min): 24 min Charges:  OT General Charges $OT Visit: 1 Visit OT Evaluation $OT Eval Moderate Complexity: 1 Mod  Malachy Chamber, OTR/L Acute Rehab Services Office: [phone]   Layla Maw 09/27/2022, 1:38 PM

## 2022-09-27 NOTE — Progress Notes (Signed)
Taken pt off from O2 1L [place].  Pt has desaturations lowest 81%.  IS at bedside, instructions on use given, demonstrated knowledge, reached 750 ml.  Patient placed back on O2 at 2L [place].

## 2022-09-28 ENCOUNTER — Encounter (HOSPITAL_COMMUNITY): Payer: Self-pay | Admitting: Physical Medicine and Rehabilitation

## 2022-09-28 ENCOUNTER — Other Ambulatory Visit: Payer: Self-pay

## 2022-09-28 ENCOUNTER — Inpatient Hospital Stay (HOSPITAL_COMMUNITY)
Admission: RE | Admit: 2022-09-28 | Discharge: 2022-10-28 | DRG: 500 | Disposition: A | Discharge status: Skilled Nursing Facility | Payer: Medicare Other | Source: Intra-hospital | Attending: Physical Medicine and Rehabilitation | Admitting: Physical Medicine and Rehabilitation

## 2022-09-28 DIAGNOSIS — M25552 Pain in left hip: Secondary | ICD-10-CM | POA: Diagnosis not present

## 2022-09-28 DIAGNOSIS — E8770 Fluid overload, unspecified: Secondary | ICD-10-CM | POA: Diagnosis not present

## 2022-09-28 DIAGNOSIS — B9562 Methicillin resistant Staphylococcus aureus infection as the cause of diseases classified elsewhere: Secondary | ICD-10-CM | POA: Diagnosis not present

## 2022-09-28 DIAGNOSIS — N139 Obstructive and reflux uropathy, unspecified: Secondary | ICD-10-CM | POA: Diagnosis not present

## 2022-09-28 DIAGNOSIS — Y95 Nosocomial condition: Secondary | ICD-10-CM | POA: Diagnosis not present

## 2022-09-28 DIAGNOSIS — R627 Adult failure to thrive: Secondary | ICD-10-CM | POA: Diagnosis present

## 2022-09-28 DIAGNOSIS — G4700 Insomnia, unspecified: Secondary | ICD-10-CM | POA: Diagnosis not present

## 2022-09-28 DIAGNOSIS — R41841 Cognitive communication deficit: Secondary | ICD-10-CM | POA: Diagnosis not present

## 2022-09-28 DIAGNOSIS — L89892 Pressure ulcer of other site, stage 2: Secondary | ICD-10-CM | POA: Diagnosis not present

## 2022-09-28 DIAGNOSIS — R32 Unspecified urinary incontinence: Secondary | ICD-10-CM | POA: Diagnosis present

## 2022-09-28 DIAGNOSIS — R2681 Unsteadiness on feet: Secondary | ICD-10-CM | POA: Diagnosis not present

## 2022-09-28 DIAGNOSIS — Z515 Encounter for palliative care: Secondary | ICD-10-CM | POA: Diagnosis not present

## 2022-09-28 DIAGNOSIS — S72002A Fracture of unspecified part of neck of left femur, initial encounter for closed fracture: Secondary | ICD-10-CM

## 2022-09-28 DIAGNOSIS — S0003XD Contusion of scalp, subsequent encounter: Secondary | ICD-10-CM

## 2022-09-28 DIAGNOSIS — T8452XA Infection and inflammatory reaction due to internal left hip prosthesis, initial encounter: Secondary | ICD-10-CM | POA: Diagnosis not present

## 2022-09-28 DIAGNOSIS — M62838 Other muscle spasm: Secondary | ICD-10-CM | POA: Diagnosis present

## 2022-09-28 DIAGNOSIS — R001 Bradycardia, unspecified: Secondary | ICD-10-CM

## 2022-09-28 DIAGNOSIS — S72142A Displaced intertrochanteric fracture of left femur, initial encounter for closed fracture: Secondary | ICD-10-CM

## 2022-09-28 DIAGNOSIS — B370 Candidal stomatitis: Secondary | ICD-10-CM | POA: Diagnosis not present

## 2022-09-28 DIAGNOSIS — R0602 Shortness of breath: Secondary | ICD-10-CM | POA: Diagnosis not present

## 2022-09-28 DIAGNOSIS — K567 Ileus, unspecified: Secondary | ICD-10-CM | POA: Diagnosis not present

## 2022-09-28 DIAGNOSIS — E7800 Pure hypercholesterolemia, unspecified: Secondary | ICD-10-CM | POA: Diagnosis present

## 2022-09-28 DIAGNOSIS — R310 Gross hematuria: Secondary | ICD-10-CM | POA: Diagnosis not present

## 2022-09-28 DIAGNOSIS — J9811 Atelectasis: Secondary | ICD-10-CM | POA: Diagnosis present

## 2022-09-28 DIAGNOSIS — S72142S Displaced intertrochanteric fracture of left femur, sequela: Secondary | ICD-10-CM | POA: Diagnosis not present

## 2022-09-28 DIAGNOSIS — L24A9 Irritant contact dermatitis due friction or contact with other specified body fluids: Secondary | ICD-10-CM

## 2022-09-28 DIAGNOSIS — I959 Hypotension, unspecified: Secondary | ICD-10-CM | POA: Diagnosis not present

## 2022-09-28 DIAGNOSIS — R339 Retention of urine, unspecified: Secondary | ICD-10-CM | POA: Diagnosis not present

## 2022-09-28 DIAGNOSIS — Z66 Do not resuscitate: Secondary | ICD-10-CM | POA: Diagnosis present

## 2022-09-28 DIAGNOSIS — E876 Hypokalemia: Secondary | ICD-10-CM | POA: Diagnosis present

## 2022-09-28 DIAGNOSIS — W010XXD Fall on same level from slipping, tripping and stumbling without subsequent striking against object, subsequent encounter: Secondary | ICD-10-CM | POA: Diagnosis present

## 2022-09-28 DIAGNOSIS — K9189 Other postprocedural complications and disorders of digestive system: Secondary | ICD-10-CM | POA: Diagnosis not present

## 2022-09-28 DIAGNOSIS — R059 Cough, unspecified: Secondary | ICD-10-CM | POA: Diagnosis not present

## 2022-09-28 DIAGNOSIS — Z8719 Personal history of other diseases of the digestive system: Secondary | ICD-10-CM | POA: Diagnosis not present

## 2022-09-28 DIAGNOSIS — E611 Iron deficiency: Secondary | ICD-10-CM | POA: Diagnosis not present

## 2022-09-28 DIAGNOSIS — S0003XS Contusion of scalp, sequela: Secondary | ICD-10-CM | POA: Diagnosis not present

## 2022-09-28 DIAGNOSIS — Z886 Allergy status to analgesic agent status: Secondary | ICD-10-CM | POA: Diagnosis not present

## 2022-09-28 DIAGNOSIS — Z79890 Hormone replacement therapy (postmenopausal): Secondary | ICD-10-CM | POA: Diagnosis not present

## 2022-09-28 DIAGNOSIS — I10 Essential (primary) hypertension: Secondary | ICD-10-CM

## 2022-09-28 DIAGNOSIS — Z9889 Other specified postprocedural states: Secondary | ICD-10-CM | POA: Diagnosis not present

## 2022-09-28 DIAGNOSIS — J90 Pleural effusion, not elsewhere classified: Secondary | ICD-10-CM | POA: Diagnosis not present

## 2022-09-28 DIAGNOSIS — Z7401 Bed confinement status: Secondary | ICD-10-CM | POA: Diagnosis not present

## 2022-09-28 DIAGNOSIS — D649 Anemia, unspecified: Secondary | ICD-10-CM | POA: Diagnosis not present

## 2022-09-28 DIAGNOSIS — E871 Hypo-osmolality and hyponatremia: Secondary | ICD-10-CM | POA: Diagnosis present

## 2022-09-28 DIAGNOSIS — N390 Urinary tract infection, site not specified: Secondary | ICD-10-CM | POA: Diagnosis not present

## 2022-09-28 DIAGNOSIS — T8149XA Infection following a procedure, other surgical site, initial encounter: Secondary | ICD-10-CM

## 2022-09-28 DIAGNOSIS — T8142XA Infection following a procedure, deep incisional surgical site, initial encounter: Secondary | ICD-10-CM | POA: Diagnosis not present

## 2022-09-28 DIAGNOSIS — R911 Solitary pulmonary nodule: Secondary | ICD-10-CM | POA: Diagnosis present

## 2022-09-28 DIAGNOSIS — Z7902 Long term (current) use of antithrombotics/antiplatelets: Secondary | ICD-10-CM | POA: Diagnosis not present

## 2022-09-28 DIAGNOSIS — M6259 Muscle wasting and atrophy, not elsewhere classified, multiple sites: Secondary | ICD-10-CM | POA: Diagnosis not present

## 2022-09-28 DIAGNOSIS — S72142D Displaced intertrochanteric fracture of left femur, subsequent encounter for closed fracture with routine healing: Secondary | ICD-10-CM | POA: Diagnosis not present

## 2022-09-28 DIAGNOSIS — J189 Pneumonia, unspecified organism: Secondary | ICD-10-CM | POA: Diagnosis not present

## 2022-09-28 DIAGNOSIS — M6281 Muscle weakness (generalized): Secondary | ICD-10-CM | POA: Diagnosis not present

## 2022-09-28 DIAGNOSIS — D62 Acute posthemorrhagic anemia: Secondary | ICD-10-CM | POA: Diagnosis present

## 2022-09-28 DIAGNOSIS — Z79899 Other long term (current) drug therapy: Secondary | ICD-10-CM

## 2022-09-28 DIAGNOSIS — Z4789 Encounter for other orthopedic aftercare: Secondary | ICD-10-CM | POA: Diagnosis not present

## 2022-09-28 DIAGNOSIS — Z7189 Other specified counseling: Secondary | ICD-10-CM | POA: Diagnosis not present

## 2022-09-28 DIAGNOSIS — D72829 Elevated white blood cell count, unspecified: Secondary | ICD-10-CM | POA: Diagnosis not present

## 2022-09-28 DIAGNOSIS — D638 Anemia in other chronic diseases classified elsewhere: Secondary | ICD-10-CM | POA: Diagnosis not present

## 2022-09-28 DIAGNOSIS — Z8673 Personal history of transient ischemic attack (TIA), and cerebral infarction without residual deficits: Secondary | ICD-10-CM

## 2022-09-28 DIAGNOSIS — R404 Transient alteration of awareness: Secondary | ICD-10-CM | POA: Diagnosis not present

## 2022-09-28 DIAGNOSIS — R1311 Dysphagia, oral phase: Secondary | ICD-10-CM | POA: Diagnosis not present

## 2022-09-28 DIAGNOSIS — I639 Cerebral infarction, unspecified: Secondary | ICD-10-CM | POA: Diagnosis not present

## 2022-09-28 DIAGNOSIS — T148XXA Other injury of unspecified body region, initial encounter: Secondary | ICD-10-CM

## 2022-09-28 DIAGNOSIS — Z741 Need for assistance with personal care: Secondary | ICD-10-CM | POA: Diagnosis not present

## 2022-09-28 DIAGNOSIS — Y793 Surgical instruments, materials and orthopedic devices (including sutures) associated with adverse incidents: Secondary | ICD-10-CM | POA: Diagnosis not present

## 2022-09-28 DIAGNOSIS — R112 Nausea with vomiting, unspecified: Secondary | ICD-10-CM | POA: Diagnosis not present

## 2022-09-28 DIAGNOSIS — E039 Hypothyroidism, unspecified: Secondary | ICD-10-CM | POA: Diagnosis present

## 2022-09-28 MED ORDER — ONDANSETRON 4 MG PO TBDP
4.0000 mg | ORAL_TABLET | Freq: Three times a day (TID) | ORAL | Status: DC | PRN
  Administered 2022-09-30 – 2022-10-02 (×4): 4 mg via ORAL
  Filled 2022-09-28 (×6): qty 1

## 2022-09-28 MED ORDER — CLOPIDOGREL BISULFATE 75 MG PO TABS: 75.0000 mg | ORAL_TABLET | Freq: Every day | ORAL | Status: DC

## 2022-09-28 MED ORDER — RIVAROXABAN 10 MG PO TABS
10.0000 mg | ORAL_TABLET | Freq: Every day | ORAL | Status: AC
  Administered 2022-09-28 – 2022-10-11 (×13): 10 mg via ORAL
  Filled 2022-09-28 (×14): qty 1

## 2022-09-28 MED ORDER — ACETAMINOPHEN 325 MG PO TABS
650.0000 mg | ORAL_TABLET | Freq: Four times a day (QID) | ORAL | Status: DC | PRN
  Administered 2022-09-29 – 2022-10-28 (×9): 650 mg via ORAL
  Filled 2022-09-28 (×9): qty 2

## 2022-09-28 MED ORDER — METHOCARBAMOL 500 MG PO TABS: 500.0000 mg | ORAL_TABLET | Freq: Three times a day (TID) | ORAL | Status: DC | PRN

## 2022-09-28 MED ORDER — BISACODYL 10 MG RE SUPP: 10.0000 mg | Freq: Every day | RECTAL | Status: DC | PRN

## 2022-09-28 MED ORDER — ENSURE ENLIVE PO LIQD
237.0000 mL | Freq: Two times a day (BID) | ORAL | Status: DC
  Administered 2022-10-03 – 2022-10-28 (×27): 237 mL via ORAL

## 2022-09-28 MED ORDER — ALUM & MAG HYDROXIDE-SIMETH 200-200-20 MG/5ML PO SUSP: 30.0000 mL | ORAL | 0 refills | Status: DC | PRN

## 2022-09-28 MED ORDER — SIMVASTATIN 20 MG PO TABS
10.0000 mg | ORAL_TABLET | Freq: Every evening | ORAL | Status: DC
  Administered 2022-09-28 – 2022-10-20 (×20): 10 mg via ORAL
  Filled 2022-09-28 (×20): qty 1

## 2022-09-28 MED ORDER — ADULT MULTIVITAMIN W/MINERALS CH
1.0000 | ORAL_TABLET | Freq: Every day | ORAL | Status: DC
  Administered 2022-09-29 – 2022-10-20 (×18): 1 via ORAL
  Filled 2022-09-28 (×20): qty 1

## 2022-09-28 MED ORDER — LEVOTHYROXINE SODIUM 50 MCG PO TABS
50.0000 ug | ORAL_TABLET | Freq: Every day | ORAL | Status: DC
  Administered 2022-09-29 – 2022-10-28 (×29): 50 ug via ORAL
  Filled 2022-09-28 (×29): qty 1

## 2022-09-28 MED ORDER — METHOCARBAMOL 500 MG PO TABS
500.0000 mg | ORAL_TABLET | Freq: Three times a day (TID) | ORAL | Status: DC | PRN
  Administered 2022-09-28 – 2022-09-30 (×4): 500 mg via ORAL
  Filled 2022-09-28 (×4): qty 1

## 2022-09-28 MED ORDER — ALUM & MAG HYDROXIDE-SIMETH 200-200-20 MG/5ML PO SUSP: 30.0000 mL | ORAL | Status: DC | PRN

## 2022-09-28 MED ORDER — POLYVINYL ALCOHOL 1.4 % OP SOLN
1.0000 [drp] | Freq: Two times a day (BID) | OPHTHALMIC | Status: DC
  Administered 2022-09-28 – 2022-10-28 (×51): 1 [drp] via OPHTHALMIC
  Filled 2022-09-28: qty 15

## 2022-09-28 MED ORDER — HYDROCHLOROTHIAZIDE 25 MG PO TABS
25.0000 mg | ORAL_TABLET | Freq: Every day | ORAL | Status: DC
  Administered 2022-09-29 – 2022-10-18 (×20): 25 mg via ORAL
  Filled 2022-09-28 (×20): qty 1

## 2022-09-28 MED ORDER — PANTOPRAZOLE SODIUM 40 MG PO TBEC
40.0000 mg | DELAYED_RELEASE_TABLET | Freq: Two times a day (BID) | ORAL | Status: DC
  Administered 2022-09-28 – 2022-10-26 (×53): 40 mg via ORAL
  Filled 2022-09-28 (×54): qty 1

## 2022-09-28 MED ORDER — SORBITOL 70 % SOLN: 30.0000 mL | Freq: Every day | Status: DC | PRN

## 2022-09-28 MED ORDER — PANTOPRAZOLE SODIUM 40 MG PO TBEC: 40.0000 mg | DELAYED_RELEASE_TABLET | Freq: Every day | ORAL | Status: DC

## 2022-09-28 MED ORDER — CLOPIDOGREL BISULFATE 75 MG PO TABS
75.0000 mg | ORAL_TABLET | Freq: Every day | ORAL | Status: DC
  Administered 2022-09-29 – 2022-10-28 (×28): 75 mg via ORAL
  Filled 2022-09-28 (×29): qty 1

## 2022-09-28 MED ORDER — HYDROCODONE-ACETAMINOPHEN 5-325 MG PO TABS
1.0000 | ORAL_TABLET | Freq: Four times a day (QID) | ORAL | Status: DC | PRN
  Administered 2022-09-28 – 2022-09-29 (×2): 1 via ORAL
  Filled 2022-09-28 (×2): qty 1
  Filled 2022-09-28: qty 2

## 2022-09-28 NOTE — Progress Notes (Signed)
[facility] PROGRESS NOTE    Progress Note  Anna Weiss  XBL:[id number] DOB: [birth year] DOA: 09/24/2022 PCP: Mayra Neer, MD     Brief Narrative:   Anna Weiss is an 88 y.o. female past medical history significant for hypertension, chronic bradycardia, essential hypertension sent to the ED after a fall imaging showed acute comminuted left intertrochanteric fracture, chest imaging showed possible patchy lower lobe infiltrates. Medically stable for transfer to rehab facility   Assessment/Plan:   Hip fracture, unspecified laterality, closed, initial encounter: Orthopedic surgery was consulted she is status post intramedullary nailing of left intertrochanteric on 09/25/2022.   Narcotics and anticoagulation per surgery. Continue MiraLAX p.o. twice daily. Physical therapy evaluated the patient, she will need inpatient rehab.  Left lower lobe consolidation: On 2 L of oxygen satting 100% she has remained febrile no leukocytosis she denies any cough. Pneumonia has been ruled out is probably atelectasis. Has complete 3-day course. Wean to room air.  Sinus bradycardia: Appears to be chronic, she is on propranolol at home 10 mg twice a day. We are holding metoprolol and verapamil her heart rate continues to be in the 50s and 60s. We will keep a close eye on it.  Essential hypertension Well-controlled current regimen.  Left parietal scalp hematoma, initial encounter: Showed age-related atrophy remote lacunar infarcts no acute intracranial findings.  Incidental right middle lobe nodule: She will need to follow-up with PCP as an outpatient     DVT prophylaxis: lovenox Family Communication:none Status is: Inpatient Remains inpatient appropriate because: Acute hip fracture    Code Status:     Code Status Orders  (From admission, onward)           Start     Ordered   09/24/22 1719  Do not attempt resuscitation (DNR)  Continuous       Question Answer Comment   In the event of cardiac or respiratory ARREST Do not call a "code blue"   In the event of cardiac or respiratory ARREST Do not perform Intubation, CPR, defibrillation or ACLS   In the event of cardiac or respiratory ARREST Use medication by any route, position, wound care, and other measures to relive pain and suffering. May use oxygen, suction and manual treatment of airway obstruction as needed for comfort.      09/24/22 1718           Code Status History     Date Active Date Inactive Code Status Order ID Comments User Context   09/24/2022 1647 09/24/2022 1718 Full Code [id number]  Lequita Halt, MD ED      Advance Directive Documentation    Flowsheet Row Most Recent Value  Type of Advance Directive Healthcare Power of [place], Out of facility DNR (pink MOST or yellow form)  Pre-existing out of facility DNR order (yellow form or pink MOST form) --  "MOST" Form in Place? --         IV Access:   Peripheral IV   Procedures and diagnostic studies:   DG FEMUR 1V LEFT  Result Date: 09/26/2022 CLINICAL DATA:  Postoperative state.  Frogleg lateral only. EXAM: LEFT FEMUR 1 VIEW COMPARISON:  Left femur radiographs 09/25/2022 FINDINGS: Redemonstration of cephalomedullary nail fixation of the previously seen intertrochanteric fracture. On his frogleg lateral view there is approximately 10-15 mm posterior displacement of the distal fracture component with respect to the proximal fracture component. Mild-to-moderate left femoroacetabular joint space narrowing. Mild peripheral degenerative osteophytes. Postoperative lateral hip and thigh subcutaneous air  and surgical skin staples. Mild-to-moderate vascular calcifications. IMPRESSION: Redemonstration of cephalomedullary nail fixation of the previously seen intertrochanteric fracture. There is mild posterior displacement of the distal fracture component with respect to the proximal fracture component. Electronically Signed   By: Yvonne Kendall  M.D.   On: 09/26/2022 08:44     Medical Consultants:   None.   Subjective:    Anna Weiss pain is controlled.  Objective:    Vitals:   09/27/22 0955 09/27/22 1317 09/27/22 2112 09/28/22 0612  BP:  105/70 (!) 138/44 (!) 140/45  Pulse:  (!) 57 (!) 51 (!) 50  Resp:  '18 16 15  '$ Temp:  98.3 F (36.8 C) 98.3 F (36.8 C) 97.8 F (36.6 C)  TempSrc:  Oral Oral Oral  SpO2: 96% 100% 95% 96%  Weight:      Height:       SpO2: 96 % O2 Flow Rate (L/min): 1 L/min   Intake/Output Summary (Last 24 hours) at 09/28/2022 0744 Last data filed at 09/28/2022 0612 Gross per 24 hour  Intake 340 ml  Output 600 ml  Net -260 ml    Filed Weights   09/25/22 1730  Weight: 74.4 kg    Exam: General exam: In no acute distress. Respiratory system: Good air movement and clear to auscultation. Cardiovascular system: S1 & S2 heard, RRR. No JVD. Gastrointestinal system: Abdomen is nondistended, soft and nontender.  Extremities: No pedal edema. Skin: No rashes, lesions or ulcers Psychiatry: Judgement and insight appear normal. Mood & affect appropriate.  Data Reviewed:    Labs: Basic Metabolic Panel: Recent Labs  Lab 09/24/22 1341  NA 136  135  K 3.9  3.8  CL 100  96*  CO2 27  GLUCOSE 163*  166*  BUN 9  10  CREATININE 0.80  0.70  CALCIUM 9.5    GFR Estimated Creatinine Clearance: 43.4 mL/min (by C-G formula based on SCr of 0.8 mg/dL). Liver Function Tests: Recent Labs  Lab 09/24/22 1341  AST 22  ALT 18  ALKPHOS 51  BILITOT 1.0  PROT 6.0*  ALBUMIN 3.8    No results for input(s): "LIPASE", "AMYLASE" in the last 168 hours. No results for input(s): "AMMONIA" in the last 168 hours. Coagulation profile Recent Labs  Lab 09/24/22 1341  INR 1.1    COVID-19 Labs  No results for input(s): "DDIMER", "FERRITIN", "LDH", "CRP" in the last 72 hours.  No results found for: "SARSCOV2NAA"  CBC: Recent Labs  Lab 09/24/22 1341  WBC 9.8  HGB 12.8  12.9  HCT 36.8   38.0  MCV 104.0*  PLT 213    Cardiac Enzymes: No results for input(s): "CKTOTAL", "CKMB", "CKMBINDEX", "TROPONINI" in the last 168 hours. BNP (last 3 results) No results for input(s): "PROBNP" in the last 8760 hours. CBG: No results for input(s): "GLUCAP" in the last 168 hours. D-Dimer: No results for input(s): "DDIMER" in the last 72 hours. Hgb A1c: No results for input(s): "HGBA1C" in the last 72 hours. Lipid Profile: No results for input(s): "CHOL", "HDL", "LDLCALC", "TRIG", "CHOLHDL", "LDLDIRECT" in the last 72 hours. Thyroid function studies: No results for input(s): "TSH", "T4TOTAL", "T3FREE", "THYROIDAB" in the last 72 hours.  Invalid input(s): "FREET3"  Anemia work up: No results for input(s): "VITAMINB12", "FOLATE", "FERRITIN", "TIBC", "IRON", "RETICCTPCT" in the last 72 hours. Sepsis Labs: Recent Labs  Lab 09/24/22 1339 09/24/22 1341  WBC  --  9.8  LATICACIDVEN 1.2  --     Microbiology Recent Results (from the  past 240 hour(s))  MRSA Next Gen by PCR, Nasal     Status: None   Collection Time: 09/24/22 10:23 PM   Specimen: Nasal Mucosa; Nasal Swab  Result Value Ref Range Status   MRSA by PCR Next Gen NOT DETECTED NOT DETECTED Final    Comment: (NOTE) The GeneXpert MRSA Assay (FDA approved for NASAL specimens only), is one component of a comprehensive MRSA colonization surveillance program. It is not intended to diagnose MRSA infection nor to guide or monitor treatment for MRSA infections. Test performance is not FDA approved in patients less than 40 years old. Performed at [facility], [place] [address], [place], [place] [address]      Medications:    feeding supplement  237 mL Oral BID BM   heparin  5,000 Units Subcutaneous Q12H   hydrochlorothiazide  25 mg Oral Daily   levothyroxine  50 mcg Oral Q0600   multivitamin with minerals  1 tablet Oral Daily   pantoprazole  40 mg Oral BID   polyvinyl alcohol  1 drop Both Eyes BID   simvastatin  10 mg  Oral QPM   Continuous Infusions:     LOS: 4 days   Charlynne Cousins  [facility]  09/28/2022, 7:44 AM

## 2022-09-28 NOTE — H&P (Addendum)
Physical Medicine and Rehabilitation Admission H&P    Chief Complaint  Patient presents with   Fall  : HPI: This is a pleasant 88 year old white female with history of hypertension and remote stroke in 2003 who fell at home on September 24, 2022.  Apparently her cane slipped and she fell onto her left side and hit her left hip and head.  There is no loss of consciousness.  CT of the head demonstrated a left parietal scalp hematoma as well as age-related atrophy and remote lacunar infarcts.  Imaging of the left femur demonstrated a left comminuted intertrochanteric fracture.  She also had wounds to the left ear which were cleaned and sutured.  On September 25, 2022 patient underwent an open reduction of her left intertrochanteric femur fracture with intramedullary nail.  Course also notable for left lower lobe consolidation which was concerning for pneumonia.  Pneumonia was ruled out, however and it was felt that this was likely just atelectasis.  A right middle lobe nodule was identified and outpatient follow-up has been recommended.  Patient was seen by therapy in her room and was requiring max assist with transfers and pre-gait tasks.  It was felt that the patient could benefit from an inpatient rehab admission and ultimately she was admitted today.  Review of Systems  Constitutional:  Negative for chills and fever.  HENT:  Positive for ear pain. Negative for hearing loss.   Eyes:  Negative for blurred vision.  Respiratory:  Negative for cough and shortness of breath.   Gastrointestinal:  Positive for nausea and vomiting. Negative for constipation.  Genitourinary:  Negative for urgency.  Musculoskeletal:  Positive for falls, joint pain and myalgias.  Skin:  Negative for itching.  Neurological:  Positive for weakness. Negative for tingling, loss of consciousness and headaches.  Psychiatric/Behavioral:  Negative for depression and suicidal ideas.    Past Medical History:  Diagnosis Date    High cholesterol    Hypertension    Stroke [facility])    2003   Tachycardia    Past Surgical History:  Procedure Laterality Date   ABDOMINAL HYSTERECTOMY     INTRAMEDULLARY (IM) NAIL INTERTROCHANTERIC Left 09/25/2022   Procedure: INTRAMEDULLARY (IM) NAIL INTERTROCHANTERIC;  Surgeon: Callie Fielding, MD;  Location: [facility];  Service: Orthopedics;  Laterality: Left;   History reviewed. No pertinent family history. Social History:  reports that she has never smoked. She has never used smokeless tobacco. She reports that she does not drink alcohol and does not use drugs. Allergies:  Allergies  Allergen Reactions   Aspirin Nausea Only   Medications Prior to Admission  Medication Sig Dispense Refill   Calcium Carb-Cholecalciferol (CALCIUM 600 + D PO) Take 2 tablets by mouth daily.     ferrous fumarate (HEMOCYTE - 106 MG FE) 325 (106 FE) MG TABS tablet Take 1 tablet by mouth daily.     hydrochlorothiazide (HYDRODIURIL) 25 MG tablet      levothyroxine (SYNTHROID, LEVOTHROID) 50 MCG tablet Take 50 mcg by mouth daily before breakfast.     methylcellulose (ARTIFICIAL TEARS) 1 % ophthalmic solution Place 1 drop into both eyes 2 (two) times daily.     Multiple Vitamins-Minerals (MULTIVITAMIN WITH MINERALS) tablet Take 1 tablet by mouth daily.     ondansetron (ZOFRAN ODT) 4 MG disintegrating tablet Take 1 tablet (4 mg total) by mouth every 8 (eight) hours as needed for nausea or vomiting. 20 tablet 0   propranolol (INDERAL) 10 MG tablet Take 10 mg by  mouth 2 (two) times daily.     simvastatin (ZOCOR) 10 MG tablet Take 10 mg by mouth every evening.     verapamil (VERELAN PM) 120 MG 24 hr capsule Take 120 mg by mouth every morning.     [DISCONTINUED] clopidogrel (PLAVIX) 75 MG tablet Take 75 mg by mouth daily with breakfast.        Home: Home Living Family/patient expects to be discharged to:: Private residence Living Arrangements: Alone (children spend the night sometimes) Available Help at  Discharge: Family, Available 24 hours/day Type of Home: House Home Access: Stairs to enter [employer] of Steps: 1 Entrance Stairs-Rails: None Home Layout: One level Bathroom Shower/Tub: Walk-in shower, Sponge bathes at baseline [employer]: Handicapped height Bathroom Accessibility: Yes Home Equipment: Toilet riser, Cane - single point, Hand held shower head, Shower seat   Functional History: Prior Function Prior Level of Function : Independent/Modified Independent Mobility Comments: Uses cane for mobility. ADLs Comments: Has been sponge bathing more recently, likes to cook  Functional Status:  Mobility: Bed Mobility Overal bed mobility: Needs Assistance Bed Mobility: Sit to Supine Supine to sit: Mod assist, HOB elevated Sit to supine: Mod assist, +2 for safety/equipment General bed mobility comments: cues for bedrail use, assist for LLE to EOB and to lower trunk Transfers Overall transfer level: Needs assistance Equipment used: Ambulation equipment used Transfers: Sit to/from Stand Sit to Stand: +2 physical assistance, Mod assist Bed to/from chair/wheelchair/BSC transfer type:: Lateral/scoot transfer  Lateral/Scoot Transfers: Max assist, +2 safety/equipment General transfer comment: declined standing d/t dizziness and nausea; x2 lateral scoots toward HOB with maxA (+2 safety) and bed pad assist Ambulation/Gait Pre-gait activities: Stood x 2 for 30-45 sec each time with Stedy with +2 min assist to maintain    ADL: ADL Overall ADL's : Needs assistance/impaired Eating/Feeding: Set up Grooming: Set up, Sitting Upper Body Bathing: Minimal assistance, Sitting Lower Body Bathing: Maximal assistance, Sitting/lateral leans Upper Body Dressing : Set up, Sitting Lower Body Dressing: Maximal assistance, Sitting/lateral leans Toileting- Clothing Manipulation and Hygiene: Total assistance, Bed level General ADL Comments: Limited by dizziness/nausea more so than pain  today but unable to progress OOB d/t this  Cognition: Cognition Overall Cognitive Status: Within Functional Limits for tasks assessed Orientation Level: Oriented X4 Cognition Arousal/Alertness: Awake/alert Behavior During Therapy: WFL for tasks assessed/performed Overall Cognitive Status: Within Functional Limits for tasks assessed General Comments: pt having some difficulty with teachback for IS technique.  Physical Exam: Blood pressure (!) 140/45, pulse (!) 50, temperature 97.8 F (36.6 C), temperature source Oral, resp. rate 15, height '5\' 6"'$  (1.676 m), weight 74.4 kg, SpO2 96 %. Physical Exam Constitutional:      General: She is not in acute distress.    Appearance: She is not ill-appearing.  HENT:     Head:     Comments: Left ear with dry dressing, wound cdi with surrounding scab Cardiovascular:     Rate and Rhythm: Regular rhythm. Bradycardia present.     Heart sounds: No murmur heard. Pulmonary:     Effort: Pulmonary effort is normal. No respiratory distress.     Breath sounds: No wheezing.  Abdominal:     General: Bowel sounds are normal. There is no distension.     Palpations: Abdomen is soft.     Tenderness: There is no abdominal tenderness.  Musculoskeletal:     Cervical back: Normal range of motion.     Comments: Left hip tender, still swollen  Skin:    General: Skin is  warm.     Findings: Bruising (arms) present.     Comments: Left hip incision CDI with dry dressing. Left ear as above.  Neurological:     Mental Status: She is alert and oriented to person, place, and time.     Cranial Nerves: No cranial nerve deficit.     Sensory: No sensory deficit.     Comments: Motor: 4-5/5 UE's. RLE 3/5 prox to 5/5 distal. LLE 1-2/5 (pain) to 4/5 distally. DTR's 1+, no abnl tone or ataxia  Psychiatric:        Mood and Affect: Mood normal.        Behavior: Behavior normal.        Thought Content: Thought content normal.        Judgment: Judgment normal.     No results  found for this or any previous visit (from the past 48 hour(s)). No results found.    Blood pressure (!) 140/45, pulse (!) 50, temperature 97.8 F (36.6 C), temperature source Oral, resp. rate 15, height '5\' 6"'$  (1.676 m), weight 74.4 kg, SpO2 96 %.  Medical Problem List and Plan: 1. Functional deficits secondary to left intertrochanteric femur fx after fall. Pt s/p IMN 09/25/22  -LLE WBAT  -patient may shower  -ELOS/Goals: 15-20 days 2.  Antithrombotics: -DVT/anticoagulation:  xarelto '10mg'$  daily for 14 days  -antiplatelet therapy:  resume plavix '75mg'$  daily 3. Pain Management: tylenol and hydrocodone prn for pain  -robaxin prn for muscle spasms 4. Mood/Behavior/Sleep: team to provide ego support  -antipsychotic agents: n/a 5. Neuropsych/cognition: This patient is capable of making decisions on her own behalf. 6. Skin/Wound Care: dry dressings to left ear and hip 7. Fluids/Electrolytes/Nutrition: encourage appropriate PO  -pt's intake has inconsistent so far 8. Nausea:   -might be positional or med related.   -also having associated gas  -pt told me she's been moving bowels, however I don't see a bm recorded  -will try sorbitol today, dulcolax suppository prn  -protonix 9. Left lower lobe consolidation  -pneumonia ruled out. Likely atelectasis  -IS, OOB  -wean oxygen to off 10. Sinus bradycardia:  -on propranolol and verapamil at home  -continue to hold as HR in 50's 11. Essential HTN  -continue hctz  -keep an eye on BUN/Cr/electrolytes 12. Right middle lobe nodule  -f/u as outpt           Meredith Staggers, MD 09/28/2022

## 2022-09-28 NOTE — Progress Notes (Signed)
This nurse attempted to wean pt. off oxygen to room air. Pt was not able to sustain oxygen saturation above above 90%, 1L applied. Pt. Was instructed to use incentive incentive spirometer.

## 2022-09-28 NOTE — Progress Notes (Signed)
Signed     PMR Admission Coordinator Pre-Admission Assessment   Patient: Anna Weiss is an 88 y.o., female MRN: [id number] DOB: [birth year] Height: _0  (167.6 cm) Weight: 74.4 kg   Insurance Information HMO:     PPO:      PCP:      IPA:      80/20: yes     OTHER:  PRIMARY: Medicare A & B      Policy#: [id number]      Subscriber: patient CM Name:       Phone#:      Fax#:  Pre-Cert#:       Employer:  Benefits:  Phone #: verified eligibility via [place] on 09/27/22     Name:  Eff. Date: Part A & B effective 12/26/91      Deduct: $1,600      Out of Pocket Max: NA  Life Max: NA CIR: 100% coverage      SNF: 100% coverage for days 1-2, 80% coverage for days 21-100 Outpatient: 80% coverage     Co-Pay: 20% Home Health: 100% coverage      Co-Pay:  DME: 80% coverage     Co-Pay: 20% Providers: pt's choice SECONDARY: Generic Commercial     Policy#: [id number] ss     Phone#: [phone]   Financial Counselor:       Phone#:    The "Data Collection Information Summary" for patients in Inpatient Rehabilitation Facilities with attached "Privacy Act [place] Records" was provided and verbally reviewed with: Patient and Family   Emergency Contact Information Contact Information       Name Relation Home Work Mobile    Ritter,Jayne Daughter [phone]   [phone]           Current Medical History  Patient Admitting Diagnosis: hip fx s/p IM Nail   History of Present Illness: Pt is 88 year old female with medical hx significant for: HTN, stroke (2003), hyperlipidemia. Pt [facility] to [facility] on 09/24/22 after a fall at home. Pt hit the left side of her head and left hip; did not have any loss of consciousness. Developed nausea and vomiting on way to hospital. Pt noted to have left auricular hematoma, left shoulder contusion, and left hip pain with shortening of left leg. Left hip x-ray revealed acute comminuted intertrochanteric fx of proximal left femur.  Orthopedics was  consulted. Chest x-ray showed left lower lobe consolidation versus pleural effusion. Pt underwent IM Nail of left femur on 09/25/22. Therapy evaluations completed and CIR recommended d/t pt's deficits in functional mobility.    Patient's medical record from [facility] has been reviewed by the rehabilitation admission coordinator and physician.   Past Medical History      Past Medical History:  Diagnosis Date   High cholesterol     Hypertension     Stroke [facility])      2003   Tachycardia        Has the patient had major surgery during 100 days prior to admission? Yes   Family History   family history is not on file.   Current Medications   Current Facility-Administered Medications:    alum & mag hydroxide-simeth (MAALOX/MYLANTA) 200-200-20 MG/5ML suspension 30 mL, 30 mL, Oral, Q4H PRN, Charlynne Cousins, MD, 30 mL at 09/27/22 1157   bisacodyl (DULCOLAX) EC tablet 5 mg, 5 mg, Oral, Daily PRN, Callie Fielding, MD   feeding supplement (ENSURE ENLIVE / ENSURE PLUS) liquid 237 mL, 237 mL, Oral,  BID BM, Callie Fielding, MD, 237 mL at 09/27/22 1024   heparin injection 5,000 Units, 5,000 Units, Subcutaneous, Q12H, Callie Fielding, MD, 5,000 Units at 09/27/22 2122   hydrALAZINE (APRESOLINE) tablet 25 mg, 25 mg, Oral, Q6H PRN, Callie Fielding, MD   hydrochlorothiazide (HYDRODIURIL) tablet 25 mg, 25 mg, Oral, Daily, Callie Fielding, MD, 25 mg at 09/27/22 3559   HYDROcodone-acetaminophen (NORCO/VICODIN) 5-325 MG per tablet 1-2 tablet, 1-2 tablet, Oral, Q6H PRN, Callie Fielding, MD, 1 tablet at 09/28/22 0038   HYDROmorphone (DILAUDID) injection 0.5 mg, 0.5 mg, Intravenous, Q2H PRN, Callie Fielding, MD, 0.5 mg at 09/26/22 0447   levothyroxine (SYNTHROID) tablet 50 mcg, 50 mcg, Oral, Q0600, Callie Fielding, MD, 50 mcg at 09/28/22 0535   methocarbamol (ROBAXIN) tablet 500 mg, 500 mg, Oral, Q8H PRN, Charlynne Cousins, MD, 500 mg at 09/28/22 0535   metoprolol tartrate (LOPRESSOR)  injection 2.5 mg, 2.5 mg, Intravenous, Q6H PRN, Callie Fielding, MD   multivitamin with minerals tablet 1 tablet, 1 tablet, Oral, Daily, Callie Fielding, MD, 1 tablet at 09/27/22 0936   ondansetron (ZOFRAN-ODT) disintegrating tablet 4 mg, 4 mg, Oral, Q8H PRN, Callie Fielding, MD, 4 mg at 09/27/22 1158   pantoprazole (PROTONIX) EC tablet 40 mg, 40 mg, Oral, BID, Charlynne Cousins, MD, 40 mg at 09/27/22 2121   polyvinyl alcohol (LIQUIFILM TEARS) 1.4 % ophthalmic solution 1 drop, 1 drop, Both Eyes, BID, Callie Fielding, MD, 1 drop at 09/27/22 2124   senna-docusate (Senokot-S) tablet 1 tablet, 1 tablet, Oral, QHS PRN, Callie Fielding, MD   simvastatin (ZOCOR) tablet 10 mg, 10 mg, Oral, QPM, Callie Fielding, MD, 10 mg at 09/27/22 1803   Patients Current Diet:  Diet Order                  Diet - low sodium heart healthy             Diet regular Room service appropriate? Yes; Fluid consistency: Thin  Diet effective now                         Precautions / Restrictions Precautions Precautions: Fall, Other (comment) Precaution Comments: monitor O2, orthostatics Restrictions Weight Bearing Restrictions: Yes LLE Weight Bearing: Weight bearing as tolerated    Has the patient had 2 or more falls or a fall with injury in the past year? Yes   Prior Activity Level Limited Community (1-2x/wk): gets out for medical appointments, hair appointments   Prior Functional Level Self Care: Did the patient need help bathing, dressing, using the toilet or eating? Independent   Indoor Mobility: Did the patient need assistance with walking from room to room (with or without device)? Independent   Stairs: Did the patient need assistance with internal or external stairs (with or without device)? Independent   Functional Cognition: Did the patient need help planning regular tasks such as shopping or remembering to take medications? Independent   Patient Information Are you of Hispanic,  Latino/a,or Spanish origin?: A. No, not of Hispanic, Latino/a, or Spanish origin What is your race?: A. White Do you need or want an interpreter to communicate with a doctor or health care staff?: 0. No   Patient's Response To:  Health Literacy and Transportation Is the patient able to respond to health literacy and transportation needs?: Yes Health Literacy - How often do you need to have someone help you when you read  instructions, pamphlets, or other written material from your doctor or pharmacy?: Never In the past 12 months, has lack of transportation kept you from medical appointments or from getting medications?: No In the past 12 months, has lack of transportation kept you from meetings, work, or from getting things needed for daily living?: No   Home Assistive Devices / [place] Devices/Equipment: Radio producer (specify quad or straight) Home Equipment: Toilet riser, Cane - single point, Hand held shower head, Shower seat   Prior Device Use: Indicate devices/aids used by the patient prior to current illness, exacerbation or injury?  cane   Current Functional Level Cognition   Overall Cognitive Status: Within Functional Limits for tasks assessed Orientation Level: Oriented X4 General Comments: pt having some difficulty with teachback for IS technique.    Extremity Assessment (includes Sensation/Coordination)   Upper Extremity Assessment: Generalized weakness  Lower Extremity Assessment: Defer to PT evaluation LLE Deficits / Details: Limited by pain of hip fx     ADLs   Overall ADL's : Needs assistance/impaired Eating/Feeding: Set up Grooming: Set up, Sitting Upper Body Bathing: Minimal assistance, Sitting Lower Body Bathing: Maximal assistance, Sitting/lateral leans Upper Body Dressing : Set up, Sitting Lower Body Dressing: Maximal assistance, Sitting/lateral leans Toileting- Clothing Manipulation and Hygiene: Total assistance, Bed level General ADL Comments:  Limited by dizziness/nausea more so than pain today but unable to progress OOB d/t this     Mobility   Overal bed mobility: Needs Assistance Bed Mobility: Sit to Supine Supine to sit: Mod assist, HOB elevated Sit to supine: Mod assist, +2 for safety/equipment General bed mobility comments: cues for bedrail use, assist for LLE to EOB and to lower trunk     Transfers   Overall transfer level: Needs assistance Equipment used: Ambulation equipment used Transfers: Sit to/from Stand Sit to Stand: +2 physical assistance, Mod assist Bed to/from chair/wheelchair/BSC transfer type:: Lateral/scoot transfer  Lateral/Scoot Transfers: Max assist, +2 safety/equipment General transfer comment: declined standing d/t dizziness and nausea; x2 lateral scoots toward HOB with maxA (+2 safety) and bed pad assist     Ambulation / Gait / Stairs / Wheelchair Mobility   Ambulation/Gait Pre-gait activities: Stood x 2 for 30-45 sec each time with Stedy with +2 min assist to maintain     Posture / Balance Dynamic Sitting Balance Sitting balance - Comments: pt needs BUE support for dynamic seated tasks Balance Overall balance assessment: Needs assistance Sitting-balance support: No upper extremity supported, Feet supported Sitting balance-Leahy Scale: Fair (static sitting) Sitting balance - Comments: pt needs BUE support for dynamic seated tasks Standing balance support: Bilateral upper extremity supported, During functional activity, Reliant on assistive device for balance Standing balance-Leahy Scale: Poor Standing balance comment: Stedy and +2 min assist for static standing     Special needs/care consideration Skin Abrasion: ear, head/left; Ecchymosis; arm, hip/bilateral, left; Hematoma: temple/left; Surgical incision: hip/left and External urinary catheter    Previous Home Environment (from acute therapy documentation) Living Arrangements: Alone (children spend the night sometimes) Available Help at  Discharge: Family, Available 24 hours/day Type of Home: House Home Layout: One level Home Access: Stairs to enter Entrance Stairs-Rails: None Entrance Stairs-Number of Steps: 1 Bathroom Shower/Tub: Walk-in shower, Sponge bathes at baseline [employer]: Handicapped height Bathroom Accessibility: Yes How Accessible: Accessible via walker [place]: No   Discharge Living Setting Plans for Discharge Living Setting: Patient's home Type of Home at Discharge: House Discharge Home Layout: One level Discharge Home Access: Stairs to enter Entrance Stairs-Rails:  None Entrance Stairs-Number of Steps: 1 Discharge Bathroom Shower/Tub: Walk-in shower Discharge Bathroom Toilet: Handicapped height Discharge Bathroom Accessibility: Yes How Accessible: Accessible via walker Does the patient have any problems obtaining your medications?: No   Social/Family/Support Systems Anticipated Caregiver: [name], daughter Anticipated Caregiver's Contact Information: [phone] Caregiver Availability: 24/7 Discharge Plan Discussed with Primary Caregiver: Yes Is Caregiver In Agreement with Plan?: Yes Does Caregiver/Family have Issues with Lodging/Transportation while Pt is in Rehab?: No   Goals Patient/Family Goal for Rehab: Supervision-Min A:PT/OT Expected length of stay: 14-16 days Pt/Family Agrees to Admission and willing to participate: Yes Program Orientation Provided & Reviewed with Pt/Caregiver Including Roles  & Responsibilities: Yes   Decrease burden of Care through IP rehab admission: NA   Possible need for SNF placement upon discharge: Not anticipated   Patient Condition: I have reviewed medical records from [facility], spoken with CM, and patient and daughter. I met with patient at the bedside and discussed via phone for inpatient rehabilitation assessment.  Patient will benefit from ongoing PT and OT, can actively participate in 3 hours of therapy a day 5 days of  the week, and can make measurable gains during the admission.  Patient will also benefit from the coordinated team approach during an Inpatient Acute Rehabilitation admission.  The patient will receive intensive therapy as well as Rehabilitation physician, nursing, social worker, and care management interventions.  Due to bladder management, safety, skin/wound care, disease management, medication administration, pain management, and patient education the patient requires 24 hour a day rehabilitation nursing.  The patient is currently Max A with mobility and Set up-Max A with basic ADLs.  Discharge setting and therapy post discharge at home with home health is anticipated.  Patient has agreed to participate in the Acute Inpatient Rehabilitation Program and will admit today.   Preadmission Screen Completed By:  Bethel Born, 09/28/2022 8:58 AM ______________________________________________________________________   Discussed status with Dr. Naaman Plummer on 09/28/22  at 8:58 AM and received approval for admission today.   Admission Coordinator:  Bethel Born, CCC-SLP, time 8:58 AM/Date 09/28/22     Assessment/Plan: Diagnosis: LEFT intetroch femur fx Does the need for close, 24 hr/day Medical supervision in concert with the patient's rehab needs make it unreasonable for this patient to be served in a less intensive setting? Yes Co-Morbidities requiring supervision/potential complications: bradycardia, anemia, post-op pain Due to bladder management, bowel management, safety, skin/wound care, disease management, medication administration, pain management, and patient education, does the patient require 24 hr/day rehab nursing? Yes Does the patient require coordinated care of a physician, rehab nurse, PT, OT, and   to address physical and functional deficits in the context of the above medical diagnosis(es)? Yes Addressing deficits in the following areas: balance, endurance, locomotion,  strength, transferring, bowel/bladder control, bathing, dressing, feeding, grooming, toileting, and psychosocial support Can the patient actively participate in an intensive therapy program of at least 3 hrs of therapy 5 days a week? Yes The potential for patient to make measurable gains while on inpatient rehab is excellent Anticipated functional outcomes upon discharge from inpatient rehab: supervision and min assist PT, supervision and min assist OT, n/a SLP Estimated rehab length of stay to reach the above functional goals is: 15-20 days Anticipated discharge destination: Home 10. Overall Rehab/Functional Prognosis: excellent     MD Signature: Meredith Staggers, MD, [place] Director Rehabilitation Services 09/28/2022

## 2022-09-28 NOTE — Progress Notes (Signed)
New order received, pt already on caseload and was evaluated yesterday (11/4). Will continue to follow.  [place], OTR/L Acute Rehab Services [facility] [phone] Office [phone]

## 2022-09-28 NOTE — Progress Notes (Signed)
Inpatient Rehabilitation Admission Medication Review by a Pharmacist  A complete drug regimen review was completed for this patient to identify any potential clinically significant medication issues.  High Risk Drug Classes Is patient taking? Indication by Medication  Antipsychotic No   Anticoagulant Yes Anticoagulation: Xarelto x 14 days  Antibiotic No   Opioid Yes Pain: Vicodin prn  Antiplatelet No   Hypoglycemics/insulin No   Vasoactive Medication Yes Hypertension: HCTZ  Chemotherapy No   Other Yes Hypothyroid: Levothyroxine Muscle spasm: robaxin prn Vitamin/supplement: MVI Nausea/vomiting: Zofran prn Hyperlipidemia: Zocor Reflux/GERD: Protonix Indigestion: Mylanta prn     Type of Medication Issue Identified Description of Issue Recommendation(s)  Drug Interaction(s) (clinically significant)     Duplicate Therapy     Allergy     No Medication Administration End Date     Incorrect Dose     Additional Drug Therapy Needed     Significant med changes from prior encounter (inform family/care partners about these prior to discharge). Continuing to hold propranolol and verapamil from home.  F/u restart: calcium/vitD, Plavix, ferrous fumarate, senna Restart as needed or follow up outpatient.  Restart as needed  Other  Plavix    Xarelto Clarify if patient should be on or not. Not currently ordered, but notes say she is supposed to be taking.  Is Xarelto supposed to be 14 days at discharge or 14 days total. Notes are not clear on when 14 days start.    Clinically significant medication issues were identified that warrant physician communication and completion of prescribed/recommended actions by midnight of the next day:  Yes  Provider Method of Notification: secure chat to Dr. Alger Simons  Pharmacist comments: Please clarify plans for Plavix and Xarelto  Time spent performing this drug regimen review (minutes):  60   Thank you for allowing [place] to participate in  this patients care. Jens Som, PharmD 09/28/2022 9:23 PM  **Pharmacist phone directory can be found on [email or web address] listed under [place]**

## 2022-09-28 NOTE — TOC Transition Note (Signed)
Transition of Care [facility]) - CM/SW Discharge Note   Patient Details  Name: Anna Weiss MRN: [id number] Date of Birth: 1925-10-12  Transition of [facility]) CM/SW Contact:  Tom-Johnson, Renea Ee, RN Phone Number: 09/28/2022, 9:33 AM   Clinical Narrative:     Patient is scheduled for discharge to CIR today. Will be transported via bed as in-house transfer. No further TOC needs noted.   Final next level of care: IP Rehab Facility Barriers to Discharge: Barriers Resolved   Patient Goals and CMS Choice Patient states their goals for this hospitalization and ongoing recovery are:: To go to CIR then return home. CMS Medicare.gov Compare Post Acute Care list provided to:: Patient Choice offered to / list presented to : Patient  Discharge Placement                Patient to be transferred to facility by: In-house transfer via bed      Discharge Plan and Services                DME Arranged: N/A DME Agency: NA       HH Arranged: NA HH Agency: NA        Social Determinants of Health (SDOH) Interventions     Readmission Risk Interventions     No data to display

## 2022-09-28 NOTE — Progress Notes (Signed)
Inpatient Rehab Admissions Coordinator:  There is a bed available for pt in CIR today. Dr. Aileen Fass is aware and in agreement. Pt, pt's daughter [name], NSG, and TOC made aware.   Gayland Curry, [place], [place] Admissions Coordinator [phone]

## 2022-09-28 NOTE — Plan of Care (Signed)

## 2022-09-28 NOTE — Discharge Summary (Signed)
Physician Discharge Summary  Anna Weiss HQP:[id number] DOB: [birth year] DOA: 09/24/2022  PCP: Mayra Neer, MD  Admit date: 09/24/2022 Discharge date: 09/28/2022  Admitted From: Home Disposition: Inpatient rehab   Recommendations for Outpatient Follow-up:  Follow up with PCP in 1-2 weeks, she will need a repeat CT scan of the chest in 4 weeks to evaluate her incidental left lung nodule Please obtain BMP/CBC in one week   Home Health:No Equipment/Devices:None  Discharge Condition:Stable CODE STATUS:Full Diet recommendation: Heart Healthy    Brief/Interim Summary: 88 y.o. female past medical history significant for hypertension, chronic bradycardia, essential hypertension sent to the ED after a fall imaging showed acute comminuted left intertrochanteric fracture, chest imaging showed possible patchy lower lobe infiltrates.   Discharge Diagnoses:  Principal Problem:   Hip fracture [facility]) Active Problems:   Displaced intertrochanteric fracture of left femur, initial encounter for closed fracture [facility])   Sinus bradycardia   Left lower lobe consolidation (HCC)   Essential hypertension   Left parietal scalp hematoma, initial encounter  Left hip fracture: Orthopedic surgery was consulted to perform intertrochanteric left nailing on 09/25/2022. Her pain was controlled. Physical therapy evaluated the patient recommended inpatient rehab.  Left lower lobe consolidation: On 2 L of oxygen on admission afebrile no leukocytosis. Antibiotics were discontinued she was we were able to wean her to room air.  Sinus bradycardia: Appears to be chronic at home she was on propanolol and verapamil these both were stopped as she was ranging in the 50s and 60s. She was kept off it for 48 hours and her heart rate remained stable no events.  Essential hypertension: Well-controlled.  Left parietal scalp hematoma: CT of the head was done showed no acute injury or findings.  Incidental right lower  middle lobe nodule: She will need follow-up with PCP as an outpatient.   Discharge Instructions  Discharge Instructions     Diet - low sodium heart healthy   Complete by: As directed    Increase activity slowly   Complete by: As directed    No wound care   Complete by: As directed       Allergies as of 09/28/2022       Reactions   Aspirin Nausea Only        Medication List     STOP taking these medications    propranolol 10 MG tablet Commonly known as: INDERAL   verapamil 120 MG 24 hr capsule Commonly known as: VERELAN PM       TAKE these medications    alum & mag hydroxide-simeth 200-200-20 MG/5ML suspension Commonly known as: MAALOX/MYLANTA Take 30 mLs by mouth every 4 (four) hours as needed for indigestion or heartburn.   CALCIUM 600 + D PO Take 2 tablets by mouth daily.   clopidogrel 75 MG tablet Commonly known as: PLAVIX Take 75 mg by mouth daily with breakfast.   ferrous fumarate 325 (106 Fe) MG Tabs tablet Commonly known as: HEMOCYTE - 106 mg FE Take 1 tablet by mouth daily.   hydrochlorothiazide 25 MG tablet Commonly known as: HYDRODIURIL   levothyroxine 50 MCG tablet Commonly known as: SYNTHROID Take 50 mcg by mouth daily before breakfast.   methocarbamol 500 MG tablet Commonly known as: ROBAXIN Take 1 tablet (500 mg total) by mouth every 8 (eight) hours as needed for muscle spasms.   methylcellulose 1 % ophthalmic solution Commonly known as: ARTIFICIAL TEARS Place 1 drop into both eyes 2 (two) times daily.   multivitamin with minerals tablet  Take 1 tablet by mouth daily.   ondansetron 4 MG disintegrating tablet Commonly known as: Zofran ODT Take 1 tablet (4 mg total) by mouth every 8 (eight) hours as needed for nausea or vomiting.   oxyCODONE 5 MG immediate release tablet Commonly known as: Roxicodone Take 0.5-1 tablets (2.5-5 mg total) by mouth every 4 (four) hours as needed for up to 7 days for severe pain or moderate pain.    pantoprazole 40 MG tablet Commonly known as: PROTONIX Take 1 tablet (40 mg total) by mouth daily.   rivaroxaban 10 MG Tabs tablet Commonly known as: XARELTO Take 1 tablet (10 mg total) by mouth daily for 14 days.   senna 8.6 MG Tabs tablet Commonly known as: SENOKOT Take 1 tablet (8.6 mg total) by mouth in the morning and at bedtime for 21 days.   simvastatin 10 MG tablet Commonly known as: ZOCOR Take 10 mg by mouth every evening.        Allergies  Allergen Reactions   Aspirin Nausea Only    Consultations: Orthopedic surgery  Procedures/Studies: DG FEMUR 1V LEFT  Result Date: 09/26/2022 CLINICAL DATA:  Postoperative state.  Frogleg lateral only. EXAM: LEFT FEMUR 1 VIEW COMPARISON:  Left femur radiographs 09/25/2022 FINDINGS: Redemonstration of cephalomedullary nail fixation of the previously seen intertrochanteric fracture. On his frogleg lateral view there is approximately 10-15 mm posterior displacement of the distal fracture component with respect to the proximal fracture component. Mild-to-moderate left femoroacetabular joint space narrowing. Mild peripheral degenerative osteophytes. Postoperative lateral hip and thigh subcutaneous air and surgical skin staples. Mild-to-moderate vascular calcifications. IMPRESSION: Redemonstration of cephalomedullary nail fixation of the previously seen intertrochanteric fracture. There is mild posterior displacement of the distal fracture component with respect to the proximal fracture component. Electronically Signed   By: Yvonne Kendall M.D.   On: 09/26/2022 08:44   DG FEMUR MIN 2 VIEWS LEFT  Result Date: 09/25/2022 CLINICAL DATA:  [id number].  Postoperative following fracture fixation. EXAM: LEFT FEMUR 2 VIEWS COMPARISON:  Preoperative left hip films and CT yesterday. FINDINGS: Intertrochanteric left proximal femoral fracture has undergone open reduction since the previous exam. Post fracture fixation alignment approximates anatomic. There is  a short intramedullary nail in the proximal femur secured by a single screw distally, with 2 dynamic compression screws extending through the nail into the neck and head of the femur. The lesser trochanter is separately fractured and medially displaced, unchanged. Degenerative arthrosis of the left hip, pubic symphysis, left SI joint and enthesopathic changes of the bony pelvis and greater trochanter are again shown as well as iliofemoral calcific arteriosclerosis. There are overlying skin staples laterally and patchy soft tissue gas. IMPRESSION: 1. Open reduction and internal fixation of the intertrochanteric left proximal femoral fracture, with near anatomic alignment. 2. Degenerative arthrosis of the left hip, pubic symphysis, left SI joint and left greater trochanter. Osteopenia. Electronically Signed   By: Telford Nab M.D.   On: 09/25/2022 22:07   DG FEMUR MIN 2 VIEWS LEFT  Result Date: 09/25/2022 CLINICAL DATA:  Fluoroscopic assistance for internal fixation EXAM: LEFT FEMUR 2 VIEWS COMPARISON:  09/24/2022 FINDINGS: Fluoroscopic images show reduction and internal fixation of intertrochanteric fracture of left femur with intramedullary rod. There is medial displacement of lesser trochanter with no significant interval change. Fluoroscopy time 2 minutes and 39 seconds. Radiation dose 36.33 mGy. IMPRESSION: Fluoroscopic assistance was provided for internal fixation of intertrochanteric fracture of left femur. Electronically Signed   By: Elmer Picker M.D.   On: 09/25/2022  20:39   DG C-Arm 1-60 Min-No Report  Result Date: 09/25/2022 Fluoroscopy was utilized by the requesting physician.  No radiographic interpretation.   DG C-Arm 1-60 Min-No Report  Result Date: 09/25/2022 Fluoroscopy was utilized by the requesting physician.  No radiographic interpretation.   ECHOCARDIOGRAM COMPLETE  Result Date: 09/25/2022    ECHOCARDIOGRAM REPORT   Patient Name:   Anna Weiss Date of Exam: 09/25/2022  Medical Rec #:  [id number]     Height:       66.5 in Accession #:    [id number]    Weight:       156.0 lb Date of Birth:  [birth year]      BSA:          1.810 m Patient Age:    88 years      BP:           132/59 mmHg Patient Gender: F             HR:           44 bpm. Exam Location:  Inpatient Procedure: 2D Echo, Cardiac Doppler, Color Doppler and Intracardiac            Opacification Agent Indications:    Abnormal ECG R94.31  History:        Patient has no prior history of Echocardiogram examinations.                 Risk Factors:Hypertension and Non-Smoker.  Sonographer:    Greer Pickerel Referring Phys: [id number] Lequita Halt  Sonographer Comments: Suboptimal subcostal window. Image acquisition challenging due to respiratory motion. IMPRESSIONS  1. Left ventricular ejection fraction, by estimation, is 60 to 65%. The left ventricle has normal function. The left ventricle has no regional wall motion abnormalities. There is mild concentric left ventricular hypertrophy. Left ventricular diastolic parameters were normal.  2. Right ventricular systolic function is mildly reduced. The right ventricular size is normal. Tricuspid regurgitation signal is inadequate for assessing PA pressure.  3. Left atrial size was severely dilated.  4. Right atrial size was severely dilated.  5. The mitral valve is degenerative. Mild mitral valve regurgitation. No evidence of mitral stenosis.  6. The aortic valve is tricuspid. Aortic valve regurgitation is trivial. Aortic valve sclerosis/calcification is present, without any evidence of aortic stenosis. Aortic valve mean gradient measures 7.3 mmHg. Aortic valve Vmax measures 1.82 m/s.  7. The inferior vena cava is normal in size with greater than 50% respiratory variability, suggesting right atrial pressure of 3 mmHg. FINDINGS  Left Ventricle: Left ventricular ejection fraction, by estimation, is 60 to 65%. The left ventricle has normal function. The left ventricle has no regional wall motion  abnormalities. Definity contrast agent was given IV to delineate the left ventricular  endocardial borders. The left ventricular internal cavity size was normal in size. There is mild concentric left ventricular hypertrophy. Left ventricular diastolic parameters were normal. Normal left ventricular filling pressure. Right Ventricle: The right ventricular size is normal. No increase in right ventricular wall thickness. Right ventricular systolic function is mildly reduced. Tricuspid regurgitation signal is inadequate for assessing PA pressure. Left Atrium: Left atrial size was severely dilated. Right Atrium: Right atrial size was severely dilated. Pericardium: There is no evidence of pericardial effusion. Mitral Valve: The mitral valve is degenerative in appearance. There is mild thickening of the mitral valve leaflet(s). There is mild calcification of the mitral valve leaflet(s). Mild mitral annular calcification. Mild mitral valve regurgitation. No evidence of  mitral valve stenosis. Tricuspid Valve: The tricuspid valve is normal in structure. Tricuspid valve regurgitation is not demonstrated. No evidence of tricuspid stenosis. Aortic Valve: The aortic valve is tricuspid. Aortic valve regurgitation is trivial. Aortic valve sclerosis/calcification is present, without any evidence of aortic stenosis. Aortic valve mean gradient measures 7.3 mmHg. Aortic valve peak gradient measures 13.2 mmHg. Aortic valve area, by VTI measures 1.08 cm. Pulmonic Valve: The pulmonic valve was normal in structure. Pulmonic valve regurgitation is not visualized. No evidence of pulmonic stenosis. Aorta: The aortic root is normal in size and structure. Venous: The inferior vena cava is normal in size with greater than 50% respiratory variability, suggesting right atrial pressure of 3 mmHg. IAS/Shunts: No atrial level shunt detected by color flow Doppler.  LEFT VENTRICLE PLAX 2D LVIDd:         4.90 cm   Diastology LVIDs:         3.50 cm   LV  e' medial:    8.92 cm/s LV PW:         1.20 cm   LV E/e' medial:  15.0 LV IVS:        1.10 cm   LV e' lateral:   11.30 cm/s LVOT diam:     1.50 cm   LV E/e' lateral: 11.9 LV SV:         52 LV SV Index:   29 LVOT Area:     1.77 cm  RIGHT VENTRICLE RV S prime:     13.60 cm/s TAPSE (M-mode): 1.5 cm LEFT ATRIUM             Index        RIGHT ATRIUM           Index LA diam:        3.60 cm 1.99 cm/m   RA Area:     29.00 cm LA Vol (A2C):   87.1 ml 48.13 ml/m  RA Volume:   88.20 ml  48.74 ml/m LA Vol (A4C):   84.3 ml 46.59 ml/m LA Biplane Vol: 89.6 ml 49.52 ml/m  AORTIC VALVE AV Area (Vmax):    1.02 cm AV Area (Vmean):   1.01 cm AV Area (VTI):     1.08 cm AV Vmax:           181.67 cm/s AV Vmean:          123.000 cm/s AV VTI:            0.480 m AV Peak Grad:      13.2 mmHg AV Mean Grad:      7.3 mmHg LVOT Vmax:         105.00 cm/s LVOT Vmean:        70.400 cm/s LVOT VTI:          0.294 m LVOT/AV VTI ratio: 0.61  AORTA Ao Root diam: 3.20 cm Ao Asc diam:  3.50 cm MITRAL VALVE MV Area (PHT): 4.24 cm     SHUNTS MV Decel Time: 179 msec     Systemic VTI:  0.29 m MV E velocity: 134.00 cm/s  Systemic Diam: 1.50 cm MV A velocity: 62.30 cm/s MV E/A ratio:  2.15 Fransico Him MD Electronically signed by Fransico Him MD Signature Date/Time: 09/25/2022/2:23:59 PM    Final    CT CHEST WO CONTRAST  Result Date: 09/24/2022 CLINICAL DATA:  Pneumonia EXAM: CT CHEST WITHOUT CONTRAST TECHNIQUE: Multidetector CT imaging of the chest was performed following the standard protocol without IV contrast. RADIATION  DOSE REDUCTION: This exam was performed according to the departmental dose-optimization program which includes automated exposure control, adjustment of the mA and/or kV according to patient size and/or use of iterative reconstruction technique. COMPARISON:  Previous studies including the chest radiograph done earlier today FINDINGS: Cardiovascular: Heart is enlarged in size. Coronary artery calcifications are seen.  Atherosclerotic plaques and calcifications are seen in thoracic aorta. There is ectasia of main pulmonary artery measuring 3.5 cm suggesting pulmonary arterial hypertension. Mediastinum/Nodes: No significant lymphadenopathy seen. Lungs/Pleura: In the image 75 of series 4, there is 2.3 x 1.9 cm lobulated noncalcified nodule in right middle lobe. In image 83, there is 9 x 6 mm noncalcified nodule in right middle lobe. There are linear densities in both apices. There are small patchy infiltrates in lingula and both lower lobes, more so on the left side. There is decreased volume in the left lower lobe. Small bilateral pleural effusions are seen. There is no pneumothorax. Upper Abdomen: There is high density in the lumen of stomach, possibly oral medication. Musculoskeletal: There is decrease in height of upper endplate of body of L1 vertebra. L1 vertebra is not included in its entirety. Fairly extensive arterial calcifications are seen in aorta and its major branches. IMPRESSION: There is 2.3 cm noncalcified nodule in right middle lobe. This lesion has to be considered primary malignant neoplasm until proven otherwise. Follow-up PET-CT and tissue sampling should be considered. There is 9 mm satellite nodule in right middle lobe suggesting possible metastatic disease. There are small patchy infiltrates in lingula and both lower lobes suggesting atelectasis/pneumonia. Small bilateral pleural effusions are seen. There is decrease in height of upper endplate of body of L1 vertebra suggesting recent or old compression fracture. L1 vertebra is not included in its entirety limiting evaluation. Cardiomegaly. Coronary artery disease. Aortic arteriosclerosis. There is ectasia of main pulmonary artery suggesting pulmonary arterial hypertension. Electronically Signed   By: Elmer Picker M.D.   On: 09/24/2022 19:27   CT Hip Left Wo Contrast  Result Date: 09/24/2022 CLINICAL DATA:  Left hip fracture EXAM: CT OF THE LEFT  HIP WITHOUT CONTRAST TECHNIQUE: Multidetector CT imaging of the left hip was performed according to the standard protocol. Multiplanar CT image reconstructions were also generated. RADIATION DOSE REDUCTION: This exam was performed according to the departmental dose-optimization program which includes automated exposure control, adjustment of the mA and/or kV according to patient size and/or use of iterative reconstruction technique. COMPARISON:  09/24/2022 x-ray FINDINGS: Bones/Joint/Cartilage Acute comminuted intertrochanteric fracture of the proximal left femur mildly impacted with mild varus angulation. Lesser trochanteric fragment is slightly displaced medially. Greater trochanteric fragment is also mildly displaced posteriorly. Femoroacetabular joint alignment is maintained without dislocation. 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 Visualized portion of the left hemipelvis is intact. No lytic or sclerotic bone lesions. Ligaments Suboptimally assessed by CT. Muscles and Tendons Posttraumatic bony avulsions of the greater and lesser trochanters. Otherwise, no acute musculotendinous abnormality. Soft tissues Soft tissue swelling at the fracture site. No inguinal lymphadenopathy. Colonic diverticulosis. Atherosclerotic vascular calcifications. IMPRESSION: Acute comminuted intertrochanteric fracture of the proximal left femur. Electronically Signed   By: Davina Poke D.O.   On: 09/24/2022 14:43   DG FEMUR MIN 2 VIEWS LEFT  Result Date: 09/24/2022 CLINICAL DATA:  Left hip fracture, fall EXAM: LEFT FEMUR 2 VIEWS COMPARISON:  09/24/2022 FINDINGS: There is redemonstration of an acute left hip intertrochanteric fracture with displacement and angulation. Distal aspect of the left femur intact. Visualized pelvis unremarkable. Peripheral vascular calcifications noted.  IMPRESSION: Acute left hip intertrochanteric fracture. Electronically Signed   By: Jerilynn Mages.  Shick M.D.   On: 09/24/2022 14:27   DG Pelvis 1-2  Views  Result Date: 09/24/2022 CLINICAL DATA:  Fall, pain EXAM: PELVIS - 1-2 VIEW COMPARISON:  02/10/2022 FINDINGS: There is an acute displaced left hip intertrochanteric fracture. Bones are osteopenic. Bony pelvis and right hip appear intact. Degenerative changes noted of the lumbosacral spine, both SI joints as well as the hips. IMPRESSION: Acute displaced left hip intertrochanteric fracture. Electronically Signed   By: Jerilynn Mages.  Shick M.D.   On: 09/24/2022 14:25   DG Chest 1 View  Result Date: 09/24/2022 CLINICAL DATA:  Fall, pain EXAM: CHEST  1 VIEW COMPARISON:  None Available. FINDINGS: Left lower lung opacity obscures the left hemidiaphragm and left cardiac border concerning for left lower lobe collapse/consolidation as well as associated left effusion. Heart is enlarged. Right lung remains clear. Negative for pneumothorax. No edema pattern. Aorta atherosclerotic. Bones are osteopenic. IMPRESSION: Left lower lung opacity concerning for left lower lobe collapse/consolidation and associated left effusion. Cardiomegaly without CHF pattern Aortic Atherosclerosis (ICD10-I70.0). Electronically Signed   By: Jerilynn Mages.  Shick M.D.   On: 09/24/2022 14:24   DG Shoulder Left  Result Date: 09/24/2022 CLINICAL DATA:  Fall, pain EXAM: LEFT SHOULDER - 2+ VIEW COMPARISON:  None Available. FINDINGS: Slight degenerative changes of the left shoulder joint and AC joint. Bones are osteopenic. No acute osseous finding, fracture or malalignment. Included chest demonstrates aortic atherosclerosis. IMPRESSION: Osteopenia and degenerative changes. No acute finding by plain radiography. Electronically Signed   By: Jerilynn Mages.  Shick M.D.   On: 09/24/2022 14:22   CT HEAD WO CONTRAST  Result Date: 09/24/2022 CLINICAL DATA:  [place].  Hit head. EXAM: CT HEAD WITHOUT CONTRAST TECHNIQUE: Contiguous axial images were obtained from the base of the skull through the vertex without intravenous contrast. RADIATION DOSE REDUCTION: This exam was performed  according to the departmental dose-optimization program which includes automated exposure control, adjustment of the mA and/or kV according to patient size and/or use of iterative reconstruction technique. COMPARISON:  None Available. FINDINGS: Brain: Age related cerebral atrophy, ventriculomegaly and periventricular white matter disease. Remote infarcts. No extra-axial fluid collections are identified. No CT findings for acute hemispheric infarction or intracranial hemorrhage. No mass lesions. The brainstem and cerebellum are normal. Vascular: Vascular calcifications but no aneurysm or hyperdense vessels. Skull: No acute skull fracture or bone lesions. Sinuses/Orbits: The paranasal sinuses and mastoid air cells are clear. The globes are intact. Other: No scalp lesions or scalp hematoma. IMPRESSION: 1. Age related cerebral atrophy, ventriculomegaly and periventricular white matter disease. 2. Remote cerebral infarctions. 3. No acute intracranial findings or skull fracture. Electronically Signed   By: Marijo Sanes M.D.   On: 09/24/2022 14:07   CT CERVICAL SPINE WO CONTRAST  Result Date: 09/24/2022 CLINICAL DATA:  Fall EXAM: CT CERVICAL SPINE WITHOUT CONTRAST TECHNIQUE: Multidetector CT imaging of the cervical spine was performed without intravenous contrast. Multiplanar CT image reconstructions were also generated. RADIATION DOSE REDUCTION: This exam was performed according to the departmental dose-optimization program which includes automated exposure control, adjustment of the mA and/or kV according to patient size and/or use of iterative reconstruction technique. COMPARISON:  Cervical radiographs 04/30/2005 FINDINGS: Alignment: Mild anterolisthesis C5-6 Skull base and vertebrae: Negative for cervical spine fracture Soft tissues and spinal canal: Negative for soft tissue mass. Atherosclerotic calcification in the carotid bifurcation bilaterally. Disc levels: Cervical spondylosis, mild for age. Bilateral facet  degeneration. Negative for spinal stenosis. Upper  chest: Apical pleuroparenchymal scarring bilaterally Other: None IMPRESSION: Cervical spondylosis. Negative for fracture. Electronically Signed   By: Franchot Gallo M.D.   On: 09/24/2022 14:05   (Echo, Carotid, EGD, Colonoscopy, ERCP)    Subjective:   Discharge Exam: Vitals:   09/27/22 2112 09/28/22 0612  BP: (!) 138/44 (!) 140/45  Pulse: (!) 51 (!) 50  Resp: 16 15  Temp: 98.3 F (36.8 C) 97.8 F (36.6 C)  SpO2: 95% 96%   Vitals:   09/27/22 0955 09/27/22 1317 09/27/22 2112 09/28/22 0612  BP:  105/70 (!) 138/44 (!) 140/45  Pulse:  (!) 57 (!) 51 (!) 50  Resp:  '18 16 15  '$ Temp:  98.3 F (36.8 C) 98.3 F (36.8 C) 97.8 F (36.6 C)  TempSrc:  Oral Oral Oral  SpO2: 96% 100% 95% 96%  Weight:      Height:        General: Pt is alert, awake, not in acute distress Cardiovascular: RRR, S1/S2 +, no rubs, no gallops Respiratory: CTA bilaterally, no wheezing, no rhonchi Abdominal: Soft, NT, ND, bowel sounds + Extremities: no edema, no cyanosis    The results of significant diagnostics from this hospitalization (including imaging, microbiology, ancillary and laboratory) are listed below for reference.     Microbiology: Recent Results (from the past 240 hour(s))  MRSA Next Gen by PCR, Nasal     Status: None   Collection Time: 09/24/22 10:23 PM   Specimen: Nasal Mucosa; Nasal Swab  Result Value Ref Range Status   MRSA by PCR Next Gen NOT DETECTED NOT DETECTED Final    Comment: (NOTE) The GeneXpert MRSA Assay (FDA approved for NASAL specimens only), is one component of a comprehensive MRSA colonization surveillance program. It is not intended to diagnose MRSA infection nor to guide or monitor treatment for MRSA infections. Test performance is not FDA approved in patients less than 52 years old. Performed at [facility], [place] [address], [place], [place] [address]      Labs: BNP (last 3 results) No results for  input(s): "BNP" in the last 8760 hours. Basic Metabolic Panel: Recent Labs  Lab 09/24/22 1341  NA 136  135  K 3.9  3.8  CL 100  96*  CO2 27  GLUCOSE 163*  166*  BUN 9  10  CREATININE 0.80  0.70  CALCIUM 9.5   Liver Function Tests: Recent Labs  Lab 09/24/22 1341  AST 22  ALT 18  ALKPHOS 51  BILITOT 1.0  PROT 6.0*  ALBUMIN 3.8   No results for input(s): "LIPASE", "AMYLASE" in the last 168 hours. No results for input(s): "AMMONIA" in the last 168 hours. CBC: Recent Labs  Lab 09/24/22 1341  WBC 9.8  HGB 12.8  12.9  HCT 36.8  38.0  MCV 104.0*  PLT 213   Cardiac Enzymes: No results for input(s): "CKTOTAL", "CKMB", "CKMBINDEX", "TROPONINI" in the last 168 hours. BNP: Invalid input(s): "POCBNP" CBG: No results for input(s): "GLUCAP" in the last 168 hours. D-Dimer No results for input(s): "DDIMER" in the last 72 hours. Hgb A1c No results for input(s): "HGBA1C" in the last 72 hours. Lipid Profile No results for input(s): "CHOL", "HDL", "LDLCALC", "TRIG", "CHOLHDL", "LDLDIRECT" in the last 72 hours. Thyroid function studies No results for input(s): "TSH", "T4TOTAL", "T3FREE", "THYROIDAB" in the last 72 hours.  Invalid input(s): "FREET3" Anemia work up No results for input(s): "VITAMINB12", "FOLATE", "FERRITIN", "TIBC", "IRON", "RETICCTPCT" in the last 72 hours. Urinalysis    Component Value  Date/Time   COLORURINE YELLOW 09/24/2022 1745   APPEARANCEUR CLOUDY (A) 09/24/2022 1745   LABSPEC 1.014 09/24/2022 1745   PHURINE 7.0 09/24/2022 1745   GLUCOSEU NEGATIVE 09/24/2022 1745   HGBUR NEGATIVE 09/24/2022 1745   BILIRUBINUR NEGATIVE 09/24/2022 1745   KETONESUR 5 (A) 09/24/2022 1745   PROTEINUR NEGATIVE 09/24/2022 1745   UROBILINOGEN 1.0 01/02/2014 1405   NITRITE NEGATIVE 09/24/2022 1745   LEUKOCYTESUR NEGATIVE 09/24/2022 1745   Sepsis Labs Recent Labs  Lab 09/24/22 1341  WBC 9.8   Microbiology Recent Results (from the past 240 hour(s))  MRSA Next  Gen by PCR, Nasal     Status: None   Collection Time: 09/24/22 10:23 PM   Specimen: Nasal Mucosa; Nasal Swab  Result Value Ref Range Status   MRSA by PCR Next Gen NOT DETECTED NOT DETECTED Final    Comment: (NOTE) The GeneXpert MRSA Assay (FDA approved for NASAL specimens only), is one component of a comprehensive MRSA colonization surveillance program. It is not intended to diagnose MRSA infection nor to guide or monitor treatment for MRSA infections. Test performance is not FDA approved in patients less than 21 years old. Performed at [facility], [place] [address] [place], [place] [address]      SIGNED:   Charlynne Cousins, MD  [facility] 09/28/2022, 8:48 AM Pager   If 7PM-7AM, please contact night-coverage [email or web address] Password [email or web address]

## 2022-09-28 NOTE — H&P (Signed)
Expand All Collapse All      Physical Medicine and Rehabilitation Admission H&P        Chief Complaint  Patient presents with   Fall  : HPI: This is a pleasant 88 year old white female with history of hypertension and remote stroke in 2003 who fell at home on September 24, 2022.  Apparently her cane slipped and she fell onto her left side and hit her left hip and head.  There is no loss of consciousness.  CT of the head demonstrated a left parietal scalp hematoma as well as age-related atrophy and remote lacunar infarcts.  Imaging of the left femur demonstrated a left comminuted intertrochanteric fracture.  She also had wounds to the left ear which were cleaned and sutured.  On September 25, 2022 patient underwent an open reduction of her left intertrochanteric femur fracture with intramedullary nail.  Course also notable for left lower lobe consolidation which was concerning for pneumonia.  Pneumonia was ruled out, however and it was felt that this was likely just atelectasis.  A right middle lobe nodule was identified and outpatient follow-up has been recommended.  Patient was seen by therapy in her room and was requiring max assist with transfers and pre-gait tasks.  It was felt that the patient could benefit from an inpatient rehab admission and ultimately she was admitted today.   Review of Systems  Constitutional:  Negative for chills and fever.  HENT:  Positive for ear pain. Negative for hearing loss.   Eyes:  Negative for blurred vision.  Respiratory:  Negative for cough and shortness of breath.   Gastrointestinal:  Positive for nausea and vomiting. Negative for constipation.  Genitourinary:  Negative for urgency.  Musculoskeletal:  Positive for falls, joint pain and myalgias.  Skin:  Negative for itching.  Neurological:  Positive for weakness. Negative for tingling, loss of consciousness and headaches.  Psychiatric/Behavioral:  Negative for depression and suicidal ideas.          Past Medical History:  Diagnosis Date   High cholesterol     Hypertension     Stroke [facility])      2003   Tachycardia           Past Surgical History:  Procedure Laterality Date   ABDOMINAL HYSTERECTOMY       INTRAMEDULLARY (IM) NAIL INTERTROCHANTERIC Left 09/25/2022    Procedure: INTRAMEDULLARY (IM) NAIL INTERTROCHANTERIC;  Surgeon: Callie Fielding, MD;  Location: [facility];  Service: Orthopedics;  Laterality: Left;    History reviewed. No pertinent family history. Social History:  reports that she has never smoked. She has never used smokeless tobacco. She reports that she does not drink alcohol and does not use drugs. Allergies:      Allergies  Allergen Reactions   Aspirin Nausea Only          Medications Prior to Admission  Medication Sig Dispense Refill   Calcium Carb-Cholecalciferol (CALCIUM 600 + D PO) Take 2 tablets by mouth daily.       ferrous fumarate (HEMOCYTE - 106 MG FE) 325 (106 FE) MG TABS tablet Take 1 tablet by mouth daily.       hydrochlorothiazide (HYDRODIURIL) 25 MG tablet         levothyroxine (SYNTHROID, LEVOTHROID) 50 MCG tablet Take 50 mcg by mouth daily before breakfast.       methylcellulose (ARTIFICIAL TEARS) 1 % ophthalmic solution Place 1 drop into both eyes 2 (two) times daily.       Multiple  Vitamins-Minerals (MULTIVITAMIN WITH MINERALS) tablet Take 1 tablet by mouth daily.       ondansetron (ZOFRAN ODT) 4 MG disintegrating tablet Take 1 tablet (4 mg total) by mouth every 8 (eight) hours as needed for nausea or vomiting. 20 tablet 0   propranolol (INDERAL) 10 MG tablet Take 10 mg by mouth 2 (two) times daily.       simvastatin (ZOCOR) 10 MG tablet Take 10 mg by mouth every evening.       verapamil (VERELAN PM) 120 MG 24 hr capsule Take 120 mg by mouth every morning.       [DISCONTINUED] clopidogrel (PLAVIX) 75 MG tablet Take 75 mg by mouth daily with breakfast.              Home: Home Living Family/patient expects to be discharged to:: Private  residence Living Arrangements: Alone (children spend the night sometimes) Available Help at Discharge: Family, Available 24 hours/day Type of Home: House Home Access: Stairs to enter [employer] of Steps: 1 Entrance Stairs-Rails: None Home Layout: One level Bathroom Shower/Tub: Walk-in shower, Sponge bathes at baseline [employer]: Handicapped height Bathroom Accessibility: Yes Home Equipment: Toilet riser, Cane - single point, Hand held shower head, Shower seat   Functional History: Prior Function Prior Level of Function : Independent/Modified Independent Mobility Comments: Uses cane for mobility. ADLs Comments: Has been sponge bathing more recently, likes to cook   Functional Status:  Mobility: Bed Mobility Overal bed mobility: Needs Assistance Bed Mobility: Sit to Supine Supine to sit: Mod assist, HOB elevated Sit to supine: Mod assist, +2 for safety/equipment General bed mobility comments: cues for bedrail use, assist for LLE to EOB and to lower trunk Transfers Overall transfer level: Needs assistance Equipment used: Ambulation equipment used Transfers: Sit to/from Stand Sit to Stand: +2 physical assistance, Mod assist Bed to/from chair/wheelchair/BSC transfer type:: Lateral/scoot transfer  Lateral/Scoot Transfers: Max assist, +2 safety/equipment General transfer comment: declined standing d/t dizziness and nausea; x2 lateral scoots toward HOB with maxA (+2 safety) and bed pad assist Ambulation/Gait Pre-gait activities: Stood x 2 for 30-45 sec each time with Stedy with +2 min assist to maintain   ADL: ADL Overall ADL's : Needs assistance/impaired Eating/Feeding: Set up Grooming: Set up, Sitting Upper Body Bathing: Minimal assistance, Sitting Lower Body Bathing: Maximal assistance, Sitting/lateral leans Upper Body Dressing : Set up, Sitting Lower Body Dressing: Maximal assistance, Sitting/lateral leans Toileting- Clothing Manipulation and Hygiene:  Total assistance, Bed level General ADL Comments: Limited by dizziness/nausea more so than pain today but unable to progress OOB d/t this   Cognition: Cognition Overall Cognitive Status: Within Functional Limits for tasks assessed Orientation Level: Oriented X4 Cognition Arousal/Alertness: Awake/alert Behavior During Therapy: WFL for tasks assessed/performed Overall Cognitive Status: Within Functional Limits for tasks assessed General Comments: pt having some difficulty with teachback for IS technique.   Physical Exam: Blood pressure (!) 140/45, pulse (!) 50, temperature 97.8 F (36.6 C), temperature source Oral, resp. rate 15, height '5\' 6"'$  (1.676 m), weight 74.4 kg, SpO2 96 %. Physical Exam Constitutional:      General: She is not in acute distress.    Appearance: She is not ill-appearing.  HENT:     Head:     Comments: Left ear with dry dressing, wound cdi with surrounding scab Cardiovascular:     Rate and Rhythm: Regular rhythm. Bradycardia present.     Heart sounds: No murmur heard. Pulmonary:     Effort: Pulmonary effort is normal. No respiratory distress.  Breath sounds: No wheezing.  Abdominal:     General: Bowel sounds are normal. There is no distension.     Palpations: Abdomen is soft.     Tenderness: There is no abdominal tenderness.  Musculoskeletal:     Cervical back: Normal range of motion.     Comments: Left hip tender, still swollen  Skin:    General: Skin is warm.     Findings: Bruising (arms) present.     Comments: Left hip incision CDI with dry dressing. Left ear as above.  Neurological:     Mental Status: She is alert and oriented to person, place, and time.     Cranial Nerves: No cranial nerve deficit.     Sensory: No sensory deficit.     Comments: Motor: 4-5/5 UE's. RLE 3/5 prox to 5/5 distal. LLE 1-2/5 (pain) to 4/5 distally. DTR's 1+, no abnl tone or ataxia  Psychiatric:        Mood and Affect: Mood normal.        Behavior: Behavior normal.         Thought Content: Thought content normal.        Judgment: Judgment normal.        Lab Results Last 48 Hours  No results found for this or any previous visit (from the past 48 hour(s)).   Imaging Results (Last 48 hours)  No results found.         Blood pressure (!) 140/45, pulse (!) 50, temperature 97.8 F (36.6 C), temperature source Oral, resp. rate 15, height '5\' 6"'$  (1.676 m), weight 74.4 kg, SpO2 96 %.   Medical Problem List and Plan: 1. Functional deficits secondary to left intertrochanteric femur fx after fall. Pt s/p IMN 09/25/22             -LLE WBAT             -patient may shower             -ELOS/Goals: 15-20 days 2.  Antithrombotics: -DVT/anticoagulation:  xarelto '10mg'$  daily for 14 days             -antiplatelet therapy:  resume plavix '75mg'$  daily 3. Pain Management: tylenol and hydrocodone prn for pain             -robaxin prn for muscle spasms 4. Mood/Behavior/Sleep: team to provide ego support             -antipsychotic agents: n/a 5. Neuropsych/cognition: This patient is capable of making decisions on her own behalf. 6. Skin/Wound Care: dry dressings to left ear and hip 7. Fluids/Electrolytes/Nutrition: encourage appropriate PO             -pt's intake has inconsistent so far 8. Nausea:              -might be positional or med related.              -also having associated gas             -pt told me she's been moving bowels, however I don't see a bm recorded             -will try sorbitol today, dulcolax suppository prn             -protonix 9. Left lower lobe consolidation             -pneumonia ruled out. Likely atelectasis             -IS, OOB             -  wean oxygen to off 10. Sinus bradycardia:             -on propranolol and verapamil at home             -continue to hold as HR in 50's 11. Essential HTN             -continue hctz             -keep an eye on BUN/Cr/electrolytes 12. Right middle lobe nodule             -f/u as outpt                  Meredith Staggers, MD 09/28/2022

## 2022-09-28 NOTE — Progress Notes (Signed)
Orthopedic Surgery Progress Note   Assessment: Patient is a 88 y.o. female with left intertrochanteric femur fracture status post IMN   Plan: -Operative plans: complete -Diet: regular -Weight bearing status: weight bearing as tolerated left lower extremity -PT/OT evaluate and treat -Pain control -Xarelto, senna, and oxycodone written prescriptions placed in chart for discharge -Dressing change performed today -Dispo: per primary  ___________________________________________________________________________  Subjective: No acute events overnight. Was able to get a good amount of sleep. Reports working with physical therapy yesterday. States she was able to get to a standing position but then got dizzy so did not do any walking. Hip pain was tolerable when standing. Denies paresthesias and numbness.   Physical Exam:  General: no acute distress, appears stated age, sleeping but awakes to voice Neurologic: alert, answering questions appropriately, following commands Respiratory: unlabored breathing on room air, symmetric chest rise Psychiatric: appropriate affect, normal cadence to speech  MSK:   -Left lower extremity  Dressing with moderate amount of blood (same as yesterday) otherwise c/d/I, staples in place and wound without active drainage once dressing removed, no erythema around the wounds Fires quadriceps, hamstrings, tibialis anterior, gastrocnemius and soleus, extensor hallucis longus Plantarflexes and dorsiflexes toes Sensation intact to light touch in sural, saphenous, tibial, deep peroneal, and superficial peroneal nerve distributions Foot warm and well perfused   Patient name: Anna Weiss Patient MRN: [id number] Date: 09/28/22

## 2022-09-29 DIAGNOSIS — S72142S Displaced intertrochanteric fracture of left femur, sequela: Secondary | ICD-10-CM | POA: Diagnosis not present

## 2022-09-29 LAB — COMPREHENSIVE METABOLIC PANEL
ALT: 14 U/L (ref 0–44)
AST: 22 U/L (ref 15–41)
Albumin: 2.8 g/dL — ABNORMAL LOW (ref 3.5–5.0)
Alkaline Phosphatase: 38 U/L (ref 38–126)
Anion gap: 5 (ref 5–15)
BUN: 15 mg/dL (ref 8–23)
CO2: 32 mmol/L (ref 22–32)
Calcium: 8.6 mg/dL — ABNORMAL LOW (ref 8.9–10.3)
Chloride: 99 mmol/L (ref 98–111)
Creatinine, Ser: 0.68 mg/dL (ref 0.44–1.00)
GFR, Estimated: >60 mL/min (ref >60)
Glucose, Bld: 112 mg/dL — ABNORMAL HIGH (ref 70–99)
Potassium: 3.5 mmol/L (ref 3.5–5.1)
Sodium: 136 mmol/L (ref 135–145)
Total Bilirubin: 1.3 mg/dL — ABNORMAL HIGH (ref 0.3–1.2)
Total Protein: 4.7 g/dL — ABNORMAL LOW (ref 6.5–8.1)

## 2022-09-29 LAB — CBC
HCT: 22.1 % — ABNORMAL LOW (ref 36.0–46.0)
Hemoglobin: 7.6 g/dL — ABNORMAL LOW (ref 12.0–15.0)
MCH: 36.2 pg — ABNORMAL HIGH (ref 26.0–34.0)
MCHC: 34.4 g/dL (ref 30.0–36.0)
MCV: 105.2 fL — ABNORMAL HIGH (ref 80.0–100.0)
Platelets: 195 10*3/uL (ref 150–400)
RBC: 2.1 MIL/uL — ABNORMAL LOW (ref 3.87–5.11)
RDW: 14.2 % (ref 11.5–15.5)
WBC: 11.2 10*3/uL — ABNORMAL HIGH (ref 4.0–10.5)
nRBC: 0 % (ref 0.0–0.2)

## 2022-09-29 MED ORDER — SORBITOL 70 % SOLN
30.0000 mL | Freq: Once | Status: AC
  Administered 2022-09-29: 30 mL via ORAL
  Filled 2022-09-29: qty 30

## 2022-09-29 NOTE — Plan of Care (Signed)
Problem: RH Balance Goal: LTG: Patient will maintain dynamic sitting balance (OT) Description: LTG:  Patient will maintain dynamic sitting balance with assistance during activities of daily living (OT) Flowsheets (Taken 09/29/2022 1651) LTG: Pt will maintain dynamic sitting balance during ADLs with: Independent Goal: LTG Patient will maintain dynamic standing with ADLs (OT) Description: LTG:  Patient will maintain dynamic standing balance with assist during activities of daily living (OT)  Flowsheets (Taken 09/29/2022 1651) LTG: Pt will maintain dynamic standing balance during ADLs with: Supervision/Verbal cueing   Problem: Sit to Stand Goal: LTG:  Patient will perform sit to stand in prep for activites of daily living with assistance level (OT) Description: LTG:  Patient will perform sit to stand in prep for activites of daily living with assistance level (OT) Flowsheets (Taken 09/29/2022 1651) LTG: PT will perform sit to stand in prep for activites of daily living with assistance level: Supervision/Verbal cueing   Problem: RH Grooming Goal: LTG Patient will perform grooming w/assist,cues/equip (OT) Description: LTG: Patient will perform grooming with assist, with/without cues using equipment (OT) Flowsheets (Taken 09/29/2022 1651) LTG: Pt will perform grooming with assistance level of: Independent   Problem: RH Bathing Goal: LTG Patient will bathe all body parts with assist levels (OT) Description: LTG: Patient will bathe all body parts with assist levels (OT) Flowsheets (Taken 09/29/2022 1651) LTG: Pt will perform bathing with assistance level/cueing: Supervision/Verbal cueing   Problem: RH Dressing Goal: LTG Patient will perform upper body dressing (OT) Description: LTG Patient will perform upper body dressing with assist, with/without cues (OT). Flowsheets (Taken 09/29/2022 1651) LTG: Pt will perform upper body dressing with assistance level of: Independent Goal: LTG Patient will  perform lower body dressing w/assist (OT) Description: LTG: Patient will perform lower body dressing with assist, with/without cues in positioning using equipment (OT) Flowsheets (Taken 09/29/2022 1651) LTG: Pt will perform lower body dressing with assistance level of: Supervision/Verbal cueing   Problem: RH Toileting Goal: LTG Patient will perform toileting task (3/3 steps) with assistance level (OT) Description: LTG: Patient will perform toileting task (3/3 steps) with assistance level (OT)  Flowsheets (Taken 09/29/2022 1651) LTG: Pt will perform toileting task (3/3 steps) with assistance level: Supervision/Verbal cueing   Problem: RH Simple Meal Prep Goal: LTG Patient will perform simple meal prep w/assist (OT) Description: LTG: Patient will perform simple meal prep with assistance, with/without cues (OT). Flowsheets (Taken 09/29/2022 1651) LTG: Pt will perform simple meal prep with assistance level of: Supervision/Verbal cueing LTG: Pt will perform simple meal prep w/level of: Ambulate with device   Problem: RH Light Housekeeping Goal: LTG Patient will perform light housekeeping w/assist (OT) Description: LTG: Patient will perform light housekeeping with assistance, with/without cues (OT). Flowsheets (Taken 09/29/2022 1651) LTG: Pt will perform light housekeeping with assistance level of: Supervision/Verbal cueing   Problem: RH Toilet Transfers Goal: LTG Patient will perform toilet transfers w/assist (OT) Description: LTG: Patient will perform toilet transfers with assist, with/without cues using equipment (OT) Flowsheets (Taken 09/29/2022 1651) LTG: Pt will perform toilet transfers with assistance level of: Supervision/Verbal cueing   Problem: RH Memory Goal: LTG Patient will demonstrate ability for day to day recall/carry over during activities of daily living with assistance level (OT) Description: LTG:  Patient will demonstrate ability for day to day recall/carry over during  activities of daily living with assistance level (OT). Flowsheets (Taken 09/29/2022 1651) LTG:  Patient will demonstrate ability for day to day recall/carry over during activities of daily living with assistance level (OT): Supervision

## 2022-09-29 NOTE — Progress Notes (Signed)
Inpatient Rehabilitation  Patient information reviewed and entered into eRehab system by Yunior Jain Andrik Sandt, OTR/L, Rehab Quality Coordinator.   Information including medical coding, functional ability and quality indicators will be reviewed and updated through discharge.   

## 2022-09-29 NOTE — Progress Notes (Signed)
Patient had BM after sorbitol - soap suds not given.

## 2022-09-29 NOTE — Progress Notes (Addendum)
Met with patient. Dressing to left hip being changed. O2 via [place]. Humidification attached. Patient c/o being stopped up and unable to breathe. Elevated HOB with good results. Patient reports that doesn't wear oxygen at home. Patient also reported that therapy said she was supposed to get Tylenol and a relaxant and hasn't had it yet. Reviewed medications with patient and did verify that she had already had the Tylenol and Robaxin. She reports that still uncomfortable. Offered pain medication but patient declined. Oriented her to rehab and that team conference will be every Tuesday. That will discuss goals, progress, barriers, equipment if any and discharge date. Discussed risk of falls, hip fx, contusion of left shoulder, and hematoma to ear. She had Sorbitol for constipation and that I did include foods that can help with constipation.  Also included foods high in protein due to albumin and total protein being low.  Included calcium supplements and foods to improve bone health.  All needs met, call bell in reach.

## 2022-09-29 NOTE — Progress Notes (Signed)
Left hip incision dressing changed was saturated serosanguinous discharged . Katharine Look PA notified.

## 2022-09-29 NOTE — Evaluation (Signed)
Physical Therapy Assessment and Plan  Patient Details  Name: Anna Weiss MRN: [id number] Date of Birth: [birth year]  PT Diagnosis: Abnormal posture, Abnormality of gait, Difficulty walking, Dizziness and giddiness, Edema, Impaired sensation, Muscle weakness, and Pain in L hip Rehab Potential: Good ELOS: 14-18 days   Today's Date: 09/29/2022 PT Individual Time: 5852-7782 PT Individual Time Calculation (min): 72 min    Hospital Problem: Principal Problem:   Fracture, intertrochanteric, left femur, sequela   Past Medical History:  Past Medical History:  Diagnosis Date   High cholesterol    Hypertension    Stroke ([place])    2003   Tachycardia    Past Surgical History:  Past Surgical History:  Procedure Laterality Date   ABDOMINAL HYSTERECTOMY     INTRAMEDULLARY (IM) NAIL INTERTROCHANTERIC Left 09/25/2022   Procedure: INTRAMEDULLARY (IM) NAIL INTERTROCHANTERIC;  Surgeon: Callie Fielding, MD;  Location: [facility];  Service: Orthopedics;  Laterality: Left;    Assessment & Plan Clinical Impression: Patient is a 88 y.o. year old female with history of hypertension and remote stroke in 2003 who fell at home on September 24, 2022.  Apparently her cane slipped and she fell onto her left side and hit her left hip and head.  There is no loss of consciousness.  CT of the head demonstrated a left parietal scalp hematoma as well as age-related atrophy and remote lacunar infarcts.  Imaging of the left femur demonstrated a left comminuted intertrochanteric fracture.  She also had wounds to the left ear which were cleaned and sutured.  On September 25, 2022 patient underwent an open reduction of her left intertrochanteric femur fracture with intramedullary nail.  Course also notable for left lower lobe consolidation which was concerning for pneumonia.  Pneumonia was ruled out, however and it was felt that this was likely just atelectasis.  A right middle lobe nodule was identified and outpatient follow-up has  been recommended.  Patient was seen by therapy in her room and was requiring max assist with transfers and pre-gait tasks.  It was felt that the patient could benefit from an inpatient rehab admission and ultimately she was admitted today.   Patient currently requires max with mobility secondary to muscle weakness and muscle joint tightness, decreased cardiorespiratoy endurance and decreased oxygen support, and decreased standing balance, decreased postural control, and decreased balance strategies.  Prior to hospitalization, patient was modified independent  with mobility and lived with Alone in a House home.  Home access is 1Stairs to enter.  Patient will benefit from skilled PT intervention to maximize safe functional mobility, minimize fall risk, and decrease caregiver burden for planned discharge home with 24 hour supervision.  Anticipate patient will benefit from follow up [place] at discharge.  PT - End of Session Activity Tolerance: Tolerates 30+ min activity with multiple rests Endurance Deficit: Yes Endurance Deficit Description: required multiple rest breaks PT Assessment Rehab Potential (ACUTE/IP ONLY): Good PT Barriers to Discharge: Home environment access/layout;Incontinence;Wound Care;New oxygen PT Barriers to Discharge Comments: pain, new oxygen, 1 STE PT Patient demonstrates impairments in the following area(s): Balance;Edema;Endurance;Nutrition;Pain;Sensory;Skin Integrity PT Transfers Functional Problem(s): Bed Mobility;Bed to Chair;Car;Furniture PT Locomotion Functional Problem(s): Ambulation;Wheelchair Mobility;Stairs PT Plan PT Intensity: Minimum of 1-2 x/day ,45 to 90 minutes PT Frequency: 5 out of 7 days PT Duration Estimated Length of Stay: 14-18 days PT Treatment/Interventions: Ambulation/gait training;Discharge planning;Functional mobility training;Psychosocial support;Therapeutic Activities;Balance/vestibular training;Disease management/prevention;Neuromuscular  re-education;Skin care/wound management;Therapeutic Exercise;Wheelchair propulsion/positioning;Cognitive remediation/compensation;DME/adaptive equipment instruction;Pain management;Splinting/orthotics;UE/LE Strength taining/ROM;Community reintegration;Patient/family education;Stair training;UE/LE Coordination activities PT Transfers  Anticipated Outcome(s): supervision with LRAD PT Locomotion Anticipated Outcome(s): supervision with LRAD PT Recommendation Recommendations for Other Services: Speech consult Follow Up Recommendations: Home health PT Patient destination: Home Equipment Recommended: To be determined Equipment Details: has quad cane   PT Evaluation Precautions/Restrictions Precautions Precautions: Fall;Other (comment) Precaution Comments: monitor O2, orthostatics Restrictions Weight Bearing Restrictions: No LLE Weight Bearing: Weight bearing as tolerated Pain Interference Pain Interference Pain Effect on Sleep: 2. Occasionally Pain Interference with Therapy Activities: 1. Rarely or not at all Pain Interference with Day-to-Day Activities: 2. Occasionally Home Living/Prior Functioning Home Living Living Arrangements: Alone Available Help at Discharge: Family;Available 24 hours/day (daughter stays with her 3-4 nights/week) Type of Home: House Home Access: Stairs to enter [employer] of Steps: 1 Entrance Stairs-Rails: None (but has grab bars) Home Layout: One level Bathroom Shower/Tub: Walk-in shower;Sponge bathes at baseline [employer]: Handicapped height Bathroom Accessibility: Yes Additional Comments: used quad cane at baseline  Lives With: Alone Prior Function Level of Independence: Requires assistive device for independence  Able to Take Stairs?: Yes Driving: No (stopped driving 2 years ago) Vision/Perception  Vision - History Ability to See in Adequate Light: 0 Adequate  Cognition Overall Cognitive Status: Within Functional Limits for tasks  assessed Arousal/Alertness: Awake/alert Orientation Level: Oriented X4 Memory: Impaired Awareness: Appears intact Problem Solving: Appears intact Safety/Judgment: Appears intact Sensation Sensation Light Touch: Impaired by gross assessment Proprioception: Appears Intact Additional Comments: pt reports altered sensation described as numbness along LLE Coordination Gross Motor Movements are Fluid and Coordinated: Yes Fine Motor Movements are Fluid and Coordinated: No Coordination and Movement Description: limited by pain and generalized weakness/deconditioning Finger Nose Finger Test: slower and mild difficulty understanding instructions Heel Shin Test: unable to perform on LLE Motor  Motor Motor: Within Functional Limits  Trunk/Postural Assessment  Cervical Assessment Cervical Assessment: Exceptions to [facility] (forward head) Thoracic Assessment Thoracic Assessment: Exceptions to [facility] (kyphosis) Lumbar Assessment Lumbar Assessment: Exceptions to [facility] (posterior pelvic tilt) Postural Control Postural Control: Within Functional Limits  Balance Balance Balance Assessed: Yes Static Sitting Balance Static Sitting - Balance Support: Feet supported;Bilateral upper extremity supported Static Sitting - Level of Assistance: 5: Stand by assistance (supervision) Dynamic Sitting Balance Dynamic Sitting - Balance Support: Feet supported;No upper extremity supported Dynamic Sitting - Level of Assistance: 5: Stand by assistance (supervision) Static Standing Balance Static Standing - Balance Support: Bilateral upper extremity supported (RW) Static Standing - Level of Assistance: 3: Mod assist Extremity Assessment  RLE Assessment RLE Assessment: Not tested General Strength Comments: grossly 3+/5 LLE Assessment LLE Assessment: Not tested General Strength Comments: grossly 3-/5  Care Tool Care Tool Bed Mobility Roll left and right activity   Roll left and right assist level: Minimal  Assistance - Patient > 75%    Sit to lying activity   Sit to lying assist level: Maximal Assistance - Patient 25 - 49%    Lying to sitting on side of bed activity   Lying to sitting on side of bed assist level: the ability to move from lying on the back to sitting on the side of the bed with no back support.: Moderate Assistance - Patient 50 - 74%     Care Tool Transfers Sit to stand transfer   Sit to stand assist level: Maximal Assistance - Patient 25 - 49%    Chair/bed transfer Chair/bed transfer activity did not occur: Safety/medical concerns (dizziness, pain, decreased balance)       Toilet transfer Toilet transfer activity did not occur: Safety/medical concerns (dizziness,  pain, decreased balance)      Car transfer Car transfer activity did not occur: Safety/medical concerns (dizziness, pain, decreased balance)        Care Tool Locomotion Ambulation Ambulation activity did not occur: Safety/medical concerns (dizziness, pain, decreased balance)        Walk 10 feet activity Walk 10 feet activity did not occur: Safety/medical concerns (dizziness, pain, decreased balance)       Walk 50 feet with 2 turns activity Walk 50 feet with 2 turns activity did not occur: Safety/medical concerns (dizziness, pain, decreased balance)      Walk 150 feet activity Walk 150 feet activity did not occur: Safety/medical concerns (dizziness, pain, decreased balance)      Walk 10 feet on uneven surfaces activity Walk 10 feet on uneven surfaces activity did not occur: Safety/medical concerns (dizziness, pain, decreased balance)      Stairs Stair activity did not occur: Safety/medical concerns (dizziness, pain, decreased balance)        Walk up/down 1 step activity Walk up/down 1 step or curb (drop down) activity did not occur: Safety/medical concerns (dizziness, pain, decreased balance)      Walk up/down 4 steps activity Walk up/down 4 steps activity did not occur: Safety/medical concerns  (dizziness, pain, decreased balance)      Walk up/down 12 steps activity Walk up/down 12 steps activity did not occur: Safety/medical concerns (dizziness, pain, decreased balance)      Pick up small objects from floor Pick up small object from the floor (from standing position) activity did not occur: Safety/medical concerns (dizziness, pain, decreased balance)      Wheelchair Is the patient using a wheelchair?: Yes Type of Wheelchair: Manual Wheelchair activity did not occur: Safety/medical concerns (dizziness, pain, decreased balance)      Wheel 50 feet with 2 turns activity Wheelchair 50 feet with 2 turns activity did not occur: Safety/medical concerns (dizziness, pain, decreased balance)    Wheel 150 feet activity Wheelchair 150 feet activity did not occur: Safety/medical concerns (dizziness, pain, decreased balance)      Refer to Care Plan for Long Term Goals  SHORT TERM GOAL WEEK 1 PT Short Term Goal 1 (Week 1): pt will transfer supine<>sitting EOB with min A consistantly PT Short Term Goal 2 (Week 1): pt will transfer sit<>stand with LRAD and mod A PT Short Term Goal 3 (Week 1): pt will transfer bed<>WC with LRAD and mod A  Recommendations for other services: None   Skilled Therapeutic Intervention Evaluation completed (see details above and below) with education on PT POC and goals and individual treatment initiated with focus on functional mobility/transfers, dressing, and dynamic standing balance. Received pt semi-reclined in bed, pt educated on PT evaluation, CIR policies, and therapy schedule and agreeable. Pt reported pain 7/10 in L hip - RN notified but reported pt not due for more pain medication yet. Pt reported difficulty swallowing this morning - MD notified of request for SLP consult. Provided pt with 18x16 manual WC and L elevating legrest. Pt on 1L O2 with SPO2 98% - decreased to RA and sats dropped to 85% and unable to recover >90% therefore placed 1L back on and  SPO2 >95%. Pt transferred semi-reclined<>sitting EOB with HOB elevated and use of bedrails with mod A for LLE management and trunk control. Pt scooted to EOB with supervision and donned pants with max A and pull over shirt with min A. Pt stood from elevated EOB with RW and max A but began to urinate  out side of brief and down leg - returned to sitting. Pt then reported feeling lightheaded and requested to lie down - sit<>supine with max A for BLE management and BP: 153/47, HR 50bpm, and SPO2 95% - of note, pt reports her baseline HR is in the 40's. Rolled L/R with min A using chuck pad and removed soiled brief/chuck pad and pt performed lower body cleaning with set up assist. Donned clean brief/chuck pad with max A and pt scooted to [facility] with min A in hooklying position with use of Trendelenburg bed position. Concluded session with pt sitting upright in bed, needs within reach, and bed alarm on. Provided pt with sponge to wet dry mouth and safety plan updated.   Mobility Bed Mobility Bed Mobility: Rolling Right;Rolling Left;Supine to Sit;Sit to Supine Rolling Right: Minimal Assistance - Patient > 75% Rolling Left: Minimal Assistance - Patient > 75% Supine to Sit: Moderate Assistance - Patient 50-74% Sit to Supine: Maximal Assistance - Patient 25-49% Transfers Transfers: Sit to Stand Sit to Stand: Maximal Assistance - Patient 25-49% Transfer (Assistive device): Rolling walker Locomotion  Gait Ambulation: No Gait Gait: No Stairs / Additional Locomotion Stairs: No Wheelchair Mobility Wheelchair Mobility: No   Discharge Criteria: Patient will be discharged from PT if patient refuses treatment 3 consecutive times without medical reason, if treatment goals not met, if there is a change in medical status, if patient makes no progress towards goals or if patient is discharged from hospital.  The above assessment, treatment plan, treatment alternatives and goals were discussed and mutually agreed  upon: by patient  Alfonse Alpers PT, DPT  09/29/2022, 12:14 PM

## 2022-09-29 NOTE — Plan of Care (Signed)
  Problem: RH BOWEL ELIMINATION Goal: RH STG MANAGE BOWEL WITH ASSISTANCE Description: STG Manage Bowel with mod i Assistance. Outcome: Not Progressing; constipation; sorbitol order   Problem: RH BLADDER ELIMINATION Goal: RH STG MANAGE BLADDER WITH ASSISTANCE Description: STG Manage Bladder With toileting Assistance Outcome: Not Progressing; frequency; incontinence

## 2022-09-29 NOTE — Progress Notes (Addendum)
PROGRESS NOTE   Subjective/Complaints:  Pt reports had a rough night- finally able to sleep pretty late, but initially, head not in right position and couldn't find call bell.  Also was having a lot of pain.  Felt nauseated last few days as well, but not now.  Denies feeling constipated, but notes that has issues with bowel incontinence and doesn't feel it- this sounds more chronic.   Notes has to burp after eats- told PT she's having difficulty swallowing. Will order SLP.  Hb 7.6- down from >11, but hadn't been checked since surgery.  Also mouth very dry- so will humidification.   ROS:  Pt denies SOB, abd pain, CP, N/V/C/D, and vision changes  Objective:   No results found. Recent Labs    09/29/22 0535  WBC 11.2*  HGB 7.6*  HCT 22.1*  PLT 195   Recent Labs    09/29/22 0535  NA 136  K 3.5  CL 99  CO2 32  GLUCOSE 112*  BUN 15  CREATININE 0.68  CALCIUM 8.6*    Intake/Output Summary (Last 24 hours) at 09/29/2022 0953 Last data filed at 09/28/2022 1756 Gross per 24 hour  Intake 120 ml  Output --  Net 120 ml        Physical Exam: Vital Signs Blood pressure (!) 142/61, pulse (!) 53, temperature 98.1 F (36.7 C), temperature source Oral, resp. rate 18, height '5\' 7"'$  (1.702 m), weight 72.7 kg, SpO2 97 %.   General: awake, alert, appropriate, sitting up in bed; appears pale; NAD HENT: conjugate gaze; oropharynx very dry- L ear has a lot of dried blood on it and padding- O2 by [place] CV: bradycardic rate; no JVD Pulmonary: CTA B/L; no W/R/R- GI: soft, NT, ND, (+)BS Psychiatric: appropriate Neurological: Ox3 Musculoskeletal:     Cervical back: Normal range of motion.     Comments: Left hip tender, still swollen  Skin:    General: Skin is warm.     Findings: Bruising (arms) present.     Comments: Left hip incision CDI with dry dressing. Left ear as above.  Neurological:     Mental Status: She is alert and  oriented to person, place, and time.     Cranial Nerves: No cranial nerve deficit.     Sensory: No sensory deficit.     Comments: Motor: 4-5/5 UE's. RLE 3/5 prox to 5/5 distal. LLE 1-2/5 (pain) to 4/5 distally. DTR's 1+, no abnl tone or ataxia   Assessment/Plan: 1. Functional deficits which require 3+ hours per day of interdisciplinary therapy in a comprehensive inpatient rehab setting. Physiatrist is providing close team supervision and 24 hour management of active medical problems listed below. Physiatrist and rehab team continue to assess barriers to discharge/monitor patient progress toward functional and medical goals  Care Tool:  Bathing              Bathing assist       Upper Body Dressing/Undressing Upper body dressing        Upper body assist      Lower Body Dressing/Undressing Lower body dressing            Lower body assist  Toileting Toileting    Toileting assist       Transfers Chair/bed transfer  Transfers assist           Locomotion Ambulation   Ambulation assist              Walk 10 feet activity   Assist           Walk 50 feet activity   Assist           Walk 150 feet activity   Assist           Walk 10 feet on uneven surface  activity   Assist           Wheelchair     Assist               Wheelchair 50 feet with 2 turns activity    Assist            Wheelchair 150 feet activity     Assist          Blood pressure (!) 142/61, pulse (!) 53, temperature 98.1 F (36.7 C), temperature source Oral, resp. rate 18, height '5\' 7"'$  (1.702 m), weight 72.7 kg, SpO2 97 %.  Medical Problem List and Plan: 1. Functional deficits secondary to left intertrochanteric femur fx after fall. Pt s/p IMN 09/25/22             -LLE WBAT             -patient may shower             -ELOS/Goals: 15-20 days 2.  Antithrombotics: -DVT/anticoagulation:  xarelto '10mg'$  daily for 14 days              -antiplatelet therapy:  resume plavix '75mg'$  daily 3. Pain Management: tylenol and hydrocodone prn for pain             -robaxin prn for muscle spasms 4. Mood/Behavior/Sleep: team to provide ego support             -antipsychotic agents: n/a 5. Neuropsych/cognition: This patient is capable of making decisions on her own behalf. 6. Skin/Wound Care: dry dressings to left ear and hip 7. Fluids/Electrolytes/Nutrition: encourage appropriate PO             -pt's intake has inconsistent so far 8. Nausea:              -might be positional or med related.              -also having associated gas             -pt told me she's been moving bowels, however I don't see a bm recorded             -will try sorbitol today, dulcolax suppository prn             -protonix 9. Left lower lobe consolidation             -pneumonia ruled out. Likely atelectasis             -IS, OOB             -wean oxygen to off 10. Sinus bradycardia:             -on propranolol and verapamil at home             -continue to hold as HR in 50's 11. Essential HTN             -  continue hctz             -keep an eye on BUN/Cr/electrolytes 12. Right middle lobe nodule 2.3 cm- should be considered primary lung CA until ruled out- also has nodule as well 9m on R middle lung- could be mets per CT scan.              -f/u as outpt  13. Mild leukocytosis- 11.2   11/6- afebrile- other than difficulty swallowing- says it's dry?; denies UTI Sx's- if gets worse clinically, will recheck in AM- right now scheduled for Wed AM.  14. Constipation  11/6- LBM on day of fall per pt and chart- will give Sorbitol after therapy and follow with SSE if needed.  15. ABLA  11/6- Down to 7.6- will recheck Wednesday to make sure not dropping further- and transfuse if required.     I spent a total of 51   minutes on total care today- >50% coordination of care- due to d/w PT about swallowing issues; and nursing about constipation. Will not  address R middle lobe per daughter-    LOS: 1 days A FACE TO FACE EVALUATION WAS PERFORMED  Laurie Lovejoy 09/29/2022, 9:53 AM

## 2022-09-29 NOTE — Progress Notes (Signed)
Inpatient [facility] Individual Statement of Services  Patient Name:  Anna Weiss  Date:  09/29/2022  Welcome to the [facility].  Our goal is to provide you with an individualized program based on your diagnosis and situation, designed to meet your specific needs.  With this comprehensive rehabilitation program, you will be expected to participate in at least 3 hours of rehabilitation therapies Monday-Friday, with modified therapy programming on the weekends.  Your rehabilitation program will include the following services:  Physical Therapy (PT), Occupational Therapy (OT), Speech Therapy (ST), 24 hour per day rehabilitation nursing, Therapeutic Recreaction (TR), Care Coordinator, Rehabilitation Medicine, Nutrition Services, and Pharmacy Services  Weekly team conferences will be held on Tuesday to discuss your progress.  Your Inpatient Rehabilitation Care Coordinator will talk with you frequently to get your input and to update you on team discussions.  Team conferences with you and your family in attendance may also be held.  Expected length of stay: 14-18 days  Overall anticipated outcome: supervision with cueing  Depending on your progress and recovery, your program may change. Your Inpatient Rehabilitation Care Coordinator will coordinate services and will keep you informed of any changes. Your Inpatient Rehabilitation Care Coordinator's name and contact numbers are listed  below.  The following services may also be recommended but are not provided by the [facility]:   [place] will be made to provide these services after discharge if needed.  Arrangements include referral to agencies that provide these services.  Your insurance has been verified to be:  Medicare & Commerical Your primary doctor is:  Mayra Neer  Pertinent information will be shared with  your doctor and your insurance company.  Inpatient Rehabilitation Care Coordinator:  Ovidio Kin, [place] or Emilia Beck  Information discussed with and copy given to patient by: Elease Hashimoto, 09/29/2022, 11:35 AM

## 2022-09-29 NOTE — Discharge Summary (Addendum)
Physician Discharge Summary  Patient ID: Anna Weiss MRN: [id number] DOB/AGE: May 08, 1925 88 y.o.  Admit date: 09/28/2022 Discharge date: 10/29/2022  Discharge Diagnoses:  Principal Problem:   Fracture, intertrochanteric, left femur, sequela Active Problems:   Drainage from wound Active problems: Debility secondary to left intertrochanteric femur fracture Nausea Left lower lobe consolidation Sinus bradycardia Essential hypertension Right middle lobe lung nodule Urinary incontinence UTI  Discharged Condition: stable  Significant Diagnostic Studies: Narrative & Impression  CLINICAL DATA:  Nausea. Vomiting.   EXAM: ABDOMEN - 1 VIEW   COMPARISON:  None Available.   FINDINGS: Mild gaseous distension of transverse, distal descending and proximal sigmoid colon. No evidence of small bowel dilatation or obstruction. Calcifications in the right abdomen at the level of L4 on L5 likely represent retroperitoneal phleboliths. No evidence of radiopaque calculi. Retrocardiac opacities likely primarily soft tissue attenuation when compared with recent chest CT. Surgical hardware in the left proximal femur.   IMPRESSION: Mild gaseous distension of distal colon is nonspecific but may be seen with colonic ileus. No evidence of obstruction.     Electronically Signed   By: Keith Rake M.D.   On: 09/30/2022 19:38   Narrative & Impression  CLINICAL DATA:  Cough   EXAM: CHEST - 2 VIEW   COMPARISON:  CT 09/24/2022   FINDINGS: Stable enlarged cardiac silhouette. There is LEFT lower lobe opacity similar comparison exam.   RIGHT lung nodule measuring 2.4 cm unchanged from recent CT. No pneumothorax. Degenerative osteophytosis of the spine.   IMPRESSION: 1. Stable LEFT basilar atelectasis and effusion. 2. Stable large RIGHT pulmonary nodule. Recommend further evaluation as recommended on comparison CT. 3. No interval change.     Electronically Signed   By: Suzy Bouchard M.D.   On: 10/01/2022 15:17   Narrative & Impression  CLINICAL DATA:  Ileus   EXAM: ABDOMEN - 1 VIEW   COMPARISON:  Portable exam 0510 hours compared to 09/30/2022   FINDINGS: Scattered gas in nondistended colon.   Normal small bowel gas pattern.   No bowel dilatation or bowel wall thickening.   Questionable calcific density projects over the RIGHT renal silhouette, cannot exclude 11 x 6 mm RIGHT renal calculus though this is not definitely seen on the prior study.   No acute osseous findings.   IMPRESSION: Nonobstructive bowel gas pattern without bowel dilatation.   Questionable 11 x 6 mm RIGHT renal calculus versus artifact.     Electronically Signed   By: Lavonia Dana M.D.   On: 10/02/2022 08:17    Labs:  Basic Metabolic Panel: Recent Labs  Lab 10/22/22 0301 10/23/22 0429 10/24/22 0316 10/25/22 0309 10/26/22 0428 10/27/22 0357  NA 130* 136 135 138 139 138  K 3.1* 3.1* 3.0* 3.4* 4.0 3.4*  CL 88* 88* 85* 91* 94* 96*  CO2 34* 38* 37* 39* 37* 35*  GLUCOSE 110* 106* 115* 112* 107* 98  BUN '10 12 15 16 20 20  '$ CREATININE 0.48 0.57 0.65 0.59 0.61 0.53  CALCIUM 8.4* 8.9 8.6* 8.7* 8.6* 8.5*  MG  --   --  1.8  --   --  1.9    CBC: Recent Labs  Lab 10/27/22 0357  WBC 5.6  NEUTROABS 3.3  HGB 9.0*  HCT 27.2*  MCV 105.8*  PLT 215    CBG: No results for input(s): "GLUCAP" in the last 168 hours.  Brief HPI:   Anna Weiss is a 88 y.o. female who fell at home on 09/24/2022.  She fell onto her left side and hit her left hip and head.  No loss of consciousness.  Imaging revealed left comminuted intertrochanteric fracture.  She had a laceration to her left ear.  Left lower lobe lung consolidation noted and pneumonia ruled out.  Right middle lobe nodule was identified and outpatient follow-up was been recommended.  He has history of essential hypertension.  She is currently complaining of nausea.   Hospital Course: YLIANA GRAVOIS was admitted to rehab  09/28/2022 for inpatient therapies to consist of PT, ST and OT at least three hours five days a week. Past admission physiatrist, therapy team and rehab RN have worked together to provide customized collaborative inpatient rehab. Nausea>>KUB obtained; BM with sorbitol 11/6. Started Protonix. Gauze removed from left ear>>healing well. KUB with possible colonic ileus.  Weaned of O2. Nausea again reported on 11/8 and changed to liquid diet. Also reported dry cough and CXR performed.>>stable. Repeat KUB 11/9 with no ileus. Magic mouthwash given for dry mouth, furrowed tongue. UA on 11/10 obtained due to leukocytosis and was negative for infection. Pain controlled with Tylenol and tramadol. Hypokalemia 11/10 repleted. Diet advanced as tolerated on 11/12. Persistent dysphagia despite MM so Diflucan 50 mg for seven days started or oral thrush on 11/14. Elevated BP on 11/14 but felt dizziness when OOB. Orthostatics normal. Given IV magnesium due to low serum magnesium. Hydralazine ordered as stated below. Nauseated and light-headed and retaining urine on 11/16. Requiring in and out catheterization. UA negative. Labs updated>>no leukocytosis. Complaining of pill dysphagia. On exam the morning of 11/16, LLE incision noted to have saturated Abd dressing and ortho surgery contacted. Patient underwent I and D of incision by Dr. Laurance Flatten same day. Ancef times two doses given; operative cultures pending. Continued to complain of nausea and was started again on Ivfs. Cultures positive for Staphylococcus and vancomycin initiated.  Infectious disease consultation obtained on 11/20. Culture significant for MRSA and Staphylococcus epidermidis.  PICC line placed and the antibiotics changed to daptomycin due to less nephrotoxicity. Hemoglobin labile and drifted to 6.4 on 11/20.  She was transfused 2 units of packed red blood cells.  Started on Plavix. Eliquis started and Xarelto stopped. She developed shortness of breath and chest x-ray  obtained as well as IV Lasix due to lower extremity edema.  Follow-up labs without evidence of leukocytosis.  She remained afebrile.  Serum potassium level drifted downward again 11/22 and repleted with IV potassium.   Serum sodium down to 125 and placed on fluid restriction 11/23. Restarted IVFs. She developed acute delirium with confusion and hallucinations later in the evening on 11/23. Serum sodium rechecked but did not drift further.  Vital signs remained stable.  Foley catheter was removed and urinalysis and urine culture were obtained and Foley replaced.  Gross hematuria noted and Eliquis was held 11/25. and Plavix continued. PA and lateral chest x-ray obtained. Ceftriaxone started for UTI.  Patient was complaining of dyspnea and RN noticed significant pedal edema on the morning of 11/27.  Serum sodium 128.  Nephrology consulted.  Follow-up with infectious disease to rule out pneumonia.  Recommendations included stopping and not restarting hydrochlorothiazide for diuretic and patient was given Lasix 60 mg IV. Ceftriaxone continued for coverage for pneumonia as well.  Daptomycin temporarily changed to linezolid.  The morning of 11/28, the patient refused medications and reported being tired and "want to stop breathing". Palliative care consult obtained to discuss goals of care.  Patient CODE STATUS changed to DNR and palliative  care continued to follow and meet with family.  Ceftriaxone completed on 11/28.  Hypokalemia persists 11/29 despite daily dose of 10 mEq.  Given 40 mEq twice daily.  Continue to follow and manage diuresis.  Sodium improved to 136 on 11/30.  Continues to complain of fatigue and lethargy.  Therapies changed to daily. Roxanol prescribed by palliative care to help with breathing and sleep at night.  Dyspnea improved.  Decision to discharge to skilled nursing facility and declined admission by [facility]. Bed available at [facility]. Pain controlled. Was not able to succeed at voiding trials  with ongoing diuresis, incontinence, etc. Discharge with Foley catheter in place.   Follow-up with ID regarding antibiotics. She will remain on linezolid 600 mg q 12 hours through 12/16. She will need CBC and CMP every Monday. PICC line removed.  Blood pressures were monitored on TID basis and hydrochlorothiazide continued and and hydralazine 25 mg TID started for elevated BP on 10/08/2022.   Rehab course: During patient's stay in rehab weekly team conferences were held to monitor patient's progress, set goals and discuss barriers to discharge. At admission, patient required max assist with mobility and with most self-care skills.  She has had little to no improvement in activity tolerance, balance, postural control as well as ability to compensate for deficits.    Disposition: SNF Discharge disposition: 03-Skilled Nursing Facility      Diet: Regular  Special Instructions: No driving, alcohol consumption or tobacco use.  Patient will continue linezolid through 11/08/2022 with weekly CMP and CBC. Follow-up with ID/Dr. Baxter Flattery.  Foley catheter remains in place with continued routine foley care.  30-35 minutes were spent on discharge planning and discharge summary.  Discharge Instructions     Ambulatory referral to Physical Medicine Rehab   Complete by: As directed    Hospital follow-up   Discharge patient   Complete by: As directed    Discharge disposition: 03-Skilled [place]   Discharge patient date: 10/27/2022     Do not resume HCTZ. Family does not want patient to be informed of lung mass.  Allergies as of 10/28/2022       Reactions   Aspirin Nausea Only        Medication List     STOP taking these medications    alum & mag hydroxide-simeth 200-200-20 MG/5ML suspension Commonly known as: MAALOX/MYLANTA   CALCIUM 600 + D PO   ferrous fumarate 325 (106 Fe) MG Tabs tablet Commonly known as: HEMOCYTE - 106 mg FE   hydrochlorothiazide 25 MG  tablet Commonly known as: HYDRODIURIL   methocarbamol 500 MG tablet Commonly known as: ROBAXIN   multivitamin with minerals tablet   ondansetron 4 MG disintegrating tablet Commonly known as: Zofran ODT   oxyCODONE 5 MG immediate release tablet Commonly known as: Roxicodone   pantoprazole 40 MG tablet Commonly known as: PROTONIX   rivaroxaban 10 MG Tabs tablet Commonly known as: XARELTO   senna 8.6 MG Tabs tablet Commonly known as: SENOKOT   simvastatin 10 MG tablet Commonly known as: ZOCOR       TAKE these medications    acetaminophen 325 MG tablet Commonly known as: TYLENOL Take 2 tablets (650 mg total) by mouth every 6 (six) hours as needed for mild pain.   antiseptic oral rinse Liqd 15 mLs by Mouth Rinse route as needed for dry mouth.   bisacodyl 10 MG suppository Commonly known as: DULCOLAX Place 1 suppository (10 mg total) rectally daily as needed for moderate constipation.  clopidogrel 75 MG tablet Commonly known as: PLAVIX Take 1 tablet (75 mg total) by mouth daily. What changed: when to take this   famotidine 40 MG/5ML suspension Commonly known as: PEPCID Place 5 mLs (40 mg total) into feeding tube 2 (two) times daily.   feeding supplement Liqd Take 237 mLs by mouth 2 (two) times daily between meals.   hydrALAZINE 25 MG tablet Commonly known as: APRESOLINE Take 1 tablet (25 mg total) by mouth every 8 (eight) hours.   iron polysaccharides 150 MG capsule Commonly known as: NIFEREX Take 1 capsule (150 mg total) by mouth daily.   levothyroxine 50 MCG tablet Commonly known as: SYNTHROID Take 1 tablet (50 mcg total) by mouth daily at 6 (six) AM. What changed: when to take this   linezolid 600 MG tablet Commonly known as: ZYVOX Take 1 tablet (600 mg total) by mouth 2 (two) times daily for 11 days.   methylcellulose 1 % ophthalmic solution Commonly known as: ARTIFICIAL TEARS Place 1 drop into both eyes 2 (two) times daily.   morphine  CONCENTRATE 10 MG/0.5ML Soln concentrated solution May place 0.13 mLs (2.6 mg total) under the tongue every 4 (four) hours as needed for severe pain or shortness of breath.   potassium chloride 20 MEQ packet Commonly known as: KLOR-CON Take 40 mEq by mouth daily.   tamsulosin 0.4 MG Caps capsule Commonly known as: FLOMAX Take 2 capsules (0.8 mg total) by mouth daily after supper.        Contact information for follow-up providers     Lovorn, Jinny Blossom, MD Follow up.   Specialty: Physical Medicine and Rehabilitation Why: As needed Contact information: [address] [place] [address] [phone]         Mayra Neer, MD. Call.   Specialty: Family Medicine Contact information: [address] [place] [address] [phone]         Callie Fielding, MD. Call.   Specialty: Orthopedic Surgery Contact information: [address] [place] [address] [phone]         Carlyle Basques, MD Follow up.   Specialty: Infectious Diseases Why: Follow-up deep tissue infection/antibiotics Contact information: [place] [address] [phone]              Contact information for after-discharge care     Destination     [facility] Preferred SNF .   Service: Skilled Nursing Contact information: [address] [place] [phone]                     Signed: Barbie Banner 10/28/2022, 10:14 AM

## 2022-09-29 NOTE — Progress Notes (Signed)
Physical Therapy Session Note  Patient Details  Name: Anna Weiss MRN: [id number] Date of Birth: August 12, 1925  Today's Date: 09/29/2022 PT Individual Time: 1450-1530 PT Individual Time Calculation (min): 40 min   Short Term Goals: Week 1:  PT Short Term Goal 1 (Week 1): pt will transfer supine<>sitting EOB with min A consistantly PT Short Term Goal 2 (Week 1): pt will transfer sit<>stand with LRAD and mod A PT Short Term Goal 3 (Week 1): pt will transfer bed<>WC with LRAD and mod A  Skilled Therapeutic Interventions/Progress Updates:  Patient in bed asleep upon PT arrival. Patient easily aroused and agreeable to PT session. Reports increased fatigue from therapy evaluations today and poor sleep quality last night. Patient decline OOB mobility at this time. Focused session on patient education for improved activity tolerance, sleep, and coping following her injuries.   Patient reported 5-6/10 L hip pain during session. Patient reports hx of poor tolerance to pain medication and sated she has refused pain meds since this morning. Educated on benefits of pain medication to improve activity tolerance and promote ROM and mobility during sessions. Patient expressed concerns about night time disorientation and confusion while in the hospital. PT reviewed pain medications with patient. Patient agreeable to tylenol and Robaxin following education. Patient declined Vicodin at this time. Discussed with RN during session. RN to provide requested medication following session.   Patient expressed frustration about her injuries and reports x2 falls with injury this year. Provided education on recovery and incorporation of balance training and fall prevention education throughout her stay. Patient reports significant distress with her loss of independence and safety. PT providing coping strategies and reviewed patient's progress with mobility on her first day in CIR. Patient with improved affect after and  appreciative.   Patient in bed at end of session with breaks locked, bed alarm set, and all needs within reach.   Therapy Documentation Precautions:  Precautions Precautions: Fall, Other (comment) Precaution Comments: monitor O2, orthostatics Restrictions Weight Bearing Restrictions: No LLE Weight Bearing: Weight bearing as tolerated    Therapy/Group: Individual Therapy  Lilianah Buffin L Jamillia Closson PT, DPT, NCS, CBIS  09/29/2022, 3:49 PM

## 2022-09-29 NOTE — Progress Notes (Signed)
Inpatient Rehabilitation Care Coordinator Assessment and Plan Patient Details  Name: Anna Weiss MRN: [id number] Date of Birth: [birth year]  Today's Date: 09/29/2022  Hospital Problems: Principal Problem:   Fracture, intertrochanteric, left femur, sequela  Past Medical History:  Past Medical History:  Diagnosis Date   High cholesterol    Hypertension    Stroke [facility])    02/21/2002   Tachycardia    Past Surgical History:  Past Surgical History:  Procedure Laterality Date   ABDOMINAL HYSTERECTOMY     INTRAMEDULLARY (IM) NAIL INTERTROCHANTERIC Left 09/25/2022   Procedure: INTRAMEDULLARY (IM) NAIL INTERTROCHANTERIC;  Surgeon: Callie Fielding, MD;  Location: [place];  Service: Orthopedics;  Laterality: Left;   Social History:  reports that she has never smoked. She has never used smokeless tobacco. She reports that she does not drink alcohol and does not use drugs.  Family / Support Systems Marital Status: Widow/Widower How Long?: 02-21-22 Patient Roles: Parent, Other (Comment) (church member) Children: Anna Weiss-daughter [phone]  Son in [place] Other Supports: Church members Anticipated Caregiver: Anna Mylar Ability/Limitations of Caregiver: was checking on her prior to admission and was providing transportation to her appoinments and grocery shopping Caregiver Availability: Other (Comment) (Daughter wants to wait and see who MOm does.) Family Dynamics: Close with her two children her daughter has been coming by a couple days a week to assist with appointments and shopping. She was not having to physically assist her. Pt cooked and baked she liked doing this  Social History Preferred language: English Religion: Baptist Cultural Background: No issues Education: [place] - How often do you need to have someone help you when you read instructions, pamphlets, or other written material from your doctor or pharmacy?: Never Writes: Yes Employment Status: Retired Special educational needs teacher Issues: No issues Guardian/Conservator: None-according to MD pt is capable of making her own decisions while here. Daughter plans to be here daily to provide support   Abuse/Neglect Abuse/Neglect Assessment Can Be Completed: Yes Physical Abuse: Denies Verbal Abuse: Denies Sexual Abuse: Denies Exploitation of patient/patient's resources: Denies Self-Neglect: Denies  Patient response to: Social Isolation - How often do you feel lonely or isolated from those around you?: Rarely  Emotional Status Pt's affect, behavior and adjustment status: Pt is quite sleepy due to not sleeping well and in pain. She wants to do well and regain as much of her independence as she can before leaving here. She has always been very independent and taken care of herself and others-her husband before he passed away in 02/22/23. Recent Psychosocial Issues: had a CVA in 02/21/2002 but was doing well and living independently Psychiatric History: No history seems to be coping appropriately and just needs to get some sleep to be able to fully function Substance Abuse History: No issues  Patient / Family Perceptions, Expectations & Goals Pt/Family understanding of illness & functional limitations: Pt and daughter can explain her hip fracture and WB issues. Both have spoken with the MD and feel they have a good understanding of her treatment plan moving forward. She hopes to get the O2 off before leaving and to get mobile Premorbid pt/family roles/activities: mom, grandmother, retiree, church member, etc Anticipated changes in roles/activities/participation: resume Pt/family expectations/goals: Pt states: " I hope to get better and move better and not have so much pain."  Daughter states: " I hope she can get much better I can't lift her."  [employer]: None Premorbid Home Care/DME Agencies: Other (Comment) (elevated toliet, cane and  tub seat) Transportation available at discharge:  Daughter provided transport prior to admission Is the patient able to respond to transportation needs?: Yes In the past 12 months, has lack of transportation kept you from medical appointments or from getting medications?: No In the past 12 months, has lack of transportation kept you from meetings, work, or from getting things needed for daily living?: No  Discharge Planning Living Arrangements: Alone Support Systems: Children, Other relatives, Friends/neighbors, Social worker community Type of Residence: Private residence Insurance Resources: [employer], Multimedia programmer (specify) (Administrator, arts) Financial Resources: Radio broadcast assistant Screen Referred: No Living Expenses: Own Money Management: Patient Does the patient have any problems obtaining your medications?: No Home Management: self Patient/Family Preliminary Plans: Return to her home and have daughter assist, daughter is not sure she can provide 24/7 care and wants to see how she does in 2 weeks. She does have medicare and is elgible to go to a NH from here. Care Coordinator Barriers to Discharge: Decreased caregiver support, Lack of/limited family support Care Coordinator Anticipated Follow Up Needs: HH/OP  Clinical Impression Pleasant yet sleepy patient who is motivated to do well but is having pain issues. Her daughter is involved and was assisting with transportation prior to admission. Pt was self sufficient otherwise. Will await therapy team evaluations and work on discharge needs.  Anna Weiss 09/29/2022, 11:33 AM

## 2022-09-29 NOTE — Evaluation (Signed)
Occupational Therapy Assessment and Plan  Patient Details  Name: Anna Weiss MRN: [id number] Date of Birth: Nov 03, 1925  OT Diagnosis: acute pain, blindness and low vision, cognitive deficits, muscle weakness (generalized), pain in joint, and swelling of limb Rehab Potential: Rehab Potential (ACUTE ONLY): Good ELOS: 2-2.5 weeks   Today's Date: 09/29/2022 OT Individual Time: 1300-1415 OT Individual Time Calculation (min): 75 min     Hospital Problem: Principal Problem:   Fracture, intertrochanteric, left femur, sequela   Past Medical History:  Past Medical History:  Diagnosis Date   High cholesterol    Hypertension    Stroke ([place])    2003   Tachycardia    Past Surgical History:  Past Surgical History:  Procedure Laterality Date   ABDOMINAL HYSTERECTOMY     INTRAMEDULLARY (IM) NAIL INTERTROCHANTERIC Left 09/25/2022   Procedure: INTRAMEDULLARY (IM) NAIL INTERTROCHANTERIC;  Surgeon: Callie Fielding, MD;  Location: [place];  Service: Orthopedics;  Laterality: Left;    Assessment & Plan Clinical Impression:  This is a pleasant 88 year old white female with history of hypertension and remote stroke in 2003 who fell at home on September 24, 2022.  Apparently her cane slipped and she fell onto her left side and hit her left hip and head.  There is no loss of consciousness.  CT of the head demonstrated a left parietal scalp hematoma as well as age-related atrophy and remote lacunar infarcts.  Imaging of the left femur demonstrated a left comminuted intertrochanteric fracture.  She also had wounds to the left ear which were cleaned and sutured.  On September 25, 2022 patient underwent an open reduction of her left intertrochanteric femur fracture with intramedullary nail.  Course also notable for left lower lobe consolidation which was concerning for pneumonia.  Pneumonia was ruled out, however and it was felt that this was likely just atelectasis.  A right middle lobe nodule was identified and  outpatient follow-up has been recommended.  Patient was seen by therapy in her room and was requiring max assist with transfers and pre-gait tasks.  It was felt that the patient could benefit from an inpatient rehab admission and ultimately she was admitted today.   Review of Systems  Constitutional:  Negative for chills and fever.  HENT:  Positive for ear pain. Negative for hearing loss.   Eyes:  Negative for blurred vision.  Respiratory:  Negative for cough and shortness of breath.   Gastrointestinal:  Positive for nausea and vomiting. Negative for constipation.  Genitourinary:  Negative for urgency.  Musculoskeletal:  Positive for falls, joint pain and myalgias.  Skin:  Negative for itching.  Neurological:  Positive for weakness. Negative for tingling, loss of consciousness and headaches.  Psychiatric/Behavioral:  Negative for depression and suicidal ideas.     Patient transferred to CIR on 09/28/2022 .    Patient currently requires max with basic self-care skills and IADL secondary to muscle weakness, decreased cardiorespiratoy endurance, decreased visual acuity, decreased memory, and decreased sitting balance, decreased standing balance, and decreased balance strategies.  Prior to hospitalization, patient could complete ADLs independently with use of quad cane.  Patient will benefit from skilled intervention to increase independence with basic self-care skills prior to discharge home with care partner.  Anticipate patient will require 24 hour supervision and follow up home health.  OT - End of Session Activity Tolerance: Tolerates 10 - 20 min activity with multiple rests Endurance Deficit: Yes Endurance Deficit Description: rest breaks OT Assessment Rehab Potential (ACUTE ONLY): Good OT Patient  demonstrates impairments in the following area(s): Balance;Cognition;Endurance OT Basic ADL's Functional Problem(s): Grooming;Bathing;Dressing;Toileting OT Advanced ADL's Functional Problem(s):  Simple Meal Preparation;Light Housekeeping OT Transfers Functional Problem(s): Toilet;Tub/Shower OT Additional Impairment(s): None OT Plan OT Intensity: Minimum of 1-2 x/day, 45 to 90 minutes OT Frequency: 5 out of 7 days OT Duration/Estimated Length of Stay: 2-2.5 weeks OT Treatment/Interventions: Balance/vestibular training;Patient/family education;Therapeutic Activities;DME/adaptive equipment instruction;Cognitive remediation/compensation;Psychosocial support;Therapeutic Exercise;Community reintegration;Functional mobility training;Self Care/advanced ADL retraining;UE/LE Strength taining/ROM;UE/LE Coordination activities;Neuromuscular re-education;Discharge planning;Pain management OT Basic Self-Care Anticipated Outcome(s): Supervision OT Toileting Anticipated Outcome(s): Supervision OT Bathroom Transfers Anticipated Outcome(s): Supervision OT Recommendation Patient destination: Home Follow Up Recommendations: 24 hour supervision/assistance;Home health OT Equipment Recommended: To be determined   OT Evaluation Precautions/Restrictions  Precautions Precautions: Fall;Other (comment) Precaution Comments: monitor O2, orthostatics Restrictions Weight Bearing Restrictions: No LLE Weight Bearing: Weight bearing as tolerated General Chart Reviewed: Yes Family/Caregiver Present: No Vital Signs Therapy Vitals Temp: 97.9 F (36.6 C) Temp Source: Oral Pulse Rate: (!) 49 Resp: 16 BP: (!) 163/49 Patient Position (if appropriate): Lying Oxygen Therapy SpO2: 96 % O2 Device: Room Air Home Living/Prior [place] expects to be discharged to:: Private residence Living Arrangements: Alone Available Help at Discharge: Family, Available 24 hours/day (daughter stays with her 3-4 nights a week) Type of Home: House Home Access: Stairs to enter Technical brewer of Steps: 1 Entrance Stairs-Rails: None Home Layout: One level Bathroom Shower/Tub: Walk-in  shower, Sponge bathes at baseline [employer]: Handicapped height Bathroom Accessibility: Yes Additional Comments: used quad cane at baseline  Lives With: Alone IADL History Occupation: Retired Type of Occupation: Multimedia programmer at [employer] and Hobbies: daily bible reading IADL Comments: manages her own medications Prior Function Level of Independence: Requires assistive device for independence  Able to Take Stairs?: Yes Driving: No Vision Baseline Vision/History: 1 Wears glasses Ability to See in Adequate Light: 0 Adequate Patient Visual Report: No change from baseline (R eye has a blockage) Vision Assessment?: Yes Eye Alignment: Within Functional Limits Ocular Range of Motion: Within Functional Limits Alignment/Gaze Preference: Within Defined Limits Tracking/Visual Pursuits: Decreased smoothness of vertical tracking;Decreased smoothness of horizontal tracking Saccades: Additional eye shifts occurred during testing Convergence: Impaired (comment) Visual Fields: No apparent deficits Depth Perception: Undershoots Additional Comments: minimally with L side Perception  Perception: Within Functional Limits Praxis Praxis: Intact Cognition Cognition Overall Cognitive Status: Within Functional Limits for tasks assessed Arousal/Alertness: Awake/alert Orientation Level: Person;Place;Situation Person: Oriented Place: Oriented Situation: Oriented Memory: Impaired Awareness: Appears intact Problem Solving: Appears intact Safety/Judgment: Appears intact Brief Interview for Mental Status (BIMS) Repetition of Three Words (First Attempt): 3 Temporal Orientation: Year: Correct Temporal Orientation: Month: Accurate within 5 days Temporal Orientation: Day: Correct Recall: "Sock": Yes, no cue required Recall: "Blue": Yes, no cue required Recall: "Bed": Yes, no cue required BIMS Summary Score: 15 Sensation Sensation Light Touch: Appears Intact Hot/Cold: Appears  Intact Proprioception: Appears Intact Stereognosis: Appears Intact Coordination Gross Motor Movements are Fluid and Coordinated: Yes Fine Motor Movements are Fluid and Coordinated: No Coordination and Movement Description: limited by pain and generalized weakness/deconditioning Finger Nose Finger Test: L UE undershoots, R UE WFL Motor  Motor Motor: Within Functional Limits  Trunk/Postural Assessment  Cervical Assessment Cervical Assessment: Exceptions to [facility] (forward head) Thoracic Assessment Thoracic Assessment: Exceptions to [facility] (rounded shoulders) Lumbar Assessment Lumbar Assessment: Exceptions to [facility] (posterior pelvic tilt) Postural Control Postural Control: Within Functional Limits  Balance Balance Balance Assessed: Yes Static Sitting Balance Static Sitting - Balance Support: Feet supported;Bilateral upper extremity supported  Static Sitting - Level of Assistance: 5: Stand by assistance (Supervision) Dynamic Sitting Balance Dynamic Sitting - Balance Support: Feet supported;No upper extremity supported Dynamic Sitting - Level of Assistance: 5: Stand by assistance (Supervision) Static Standing Balance Static Standing - Balance Support: Bilateral upper extremity supported Static Standing - Level of Assistance: 3: Mod assist Dynamic Standing Balance Dynamic Standing - Balance Support: Bilateral upper extremity supported Dynamic Standing - Level of Assistance: 4: Min assist Dynamic Standing - Balance Activities: Reaching for objects Extremity/Trunk Assessment RUE Assessment RUE Assessment: Within Functional Limits General Strength Comments: 4/5 LUE Assessment LUE Assessment: Within Functional Limits General Strength Comments: 3+/5  Care Tool Care Tool Self Care Eating   Eating Assist Level: Set up assist    Oral Care    Oral Care Assist Level: Supervision/Verbal cueing    Bathing   Body parts bathed by patient: Right arm;Left arm;Chest;Abdomen Body parts bathed by  helper: Buttocks;Left lower leg;Right lower leg;Face;Front perineal area   Assist Level: Moderate Assistance - Patient 50 - 74%    Upper Body Dressing(including orthotics)   What is the patient wearing?: Pull over shirt   Assist Level: Supervision/Verbal cueing    Lower Body Dressing (excluding footwear)   What is the patient wearing?: Pants;Underwear/pull up Assist for lower body dressing: Maximal Assistance - Patient 25 - 49%    Putting on/Taking off footwear   What is the patient wearing?: Non-skid slipper socks Assist for footwear: Dependent - Patient 0%       Care Tool Toileting Toileting activity   Assist for toileting: Maximal Assistance - Patient 25 - 49%     Care Tool Bed Mobility Roll left and right activity   Roll left and right assist level: Minimal Assistance - Patient > 75%    Sit to lying activity   Sit to lying assist level: Moderate Assistance - Patient 50 - 74%    Lying to sitting on side of bed activity   Lying to sitting on side of bed assist level: the ability to move from lying on the back to sitting on the side of the bed with no back support.: Moderate Assistance - Patient 50 - 74%     Care Tool Transfers Sit to stand transfer   Sit to stand assist level: Maximal Assistance - Patient 25 - 49%    Chair/bed transfer Chair/bed transfer activity did not occur: Safety/medical concerns (dizziness, pain, decreased balance) Chair/bed transfer assist level: Maximal Assistance - Patient 25 - 49%     Toilet transfer Toilet transfer activity did not occur: Safety/medical concerns (dizziness, pain, decreased balance) Assist Level: Maximal Assistance - Patient 24 - 49%     Care Tool Cognition  Expression of Ideas and Wants Expression of Ideas and Wants: 4. Without difficulty (complex and basic) - expresses complex messages without difficulty and with speech that is clear and easy to understand  Understanding Verbal and Non-Verbal Content Understanding Verbal  and Non-Verbal Content: 3. Usually understands - understands most conversations, but misses some part/intent of message. Requires cues at times to understand   Memory/Recall Ability Memory/Recall Ability : Current season;Location of own room;Staff names and faces;That he or she is in a hospital/hospital unit   Refer to Care Plan for [place] 1 OT Short Term Goal 1 (Week 1): Pt will be Mod A for toileting tasks OT Short Term Goal 2 (Week 1): Pt will be Min A for functional transfers OT Short Term Goal 3 (Week  1): Pt will be Mod A for LB dressing/bathing OT Short Term Goal 4 (Week 1): Pt will require no more than 3 rest breaks  Recommendations for other services: None    Skilled Therapeutic Intervention ADL ADL Eating: Set up Where Assessed-Eating: Bed level Grooming: Supervision/safety Where Assessed-Grooming: Bed level Upper Body Bathing: Supervision/safety Where Assessed-Upper Body Bathing: Bed level Lower Body Bathing: Maximal assistance Where Assessed-Lower Body Bathing: Edge of bed;Bed level Upper Body Dressing: Supervision/safety Where Assessed-Upper Body Dressing: Edge of bed Lower Body Dressing: Maximal assistance Where Assessed-Lower Body Dressing: Edge of bed;Bed level Toileting: Maximal assistance Where Assessed-Toileting: Bedside Commode Toilet Transfer: Maximal assistance Toilet Transfer Method: Other (comment) (stand step) Science writer: Radiographer, therapeutic: Unable to assess Social research officer, government: Maximal assistance Social research officer, government Method: Radiographer, therapeutic: Shower seat with back Mobility  Bed Mobility Bed Mobility: Rolling Right;Rolling Left;Supine to Sit;Sit to Supine Rolling Right: Minimal Assistance - Patient > 75% Rolling Left: Minimal Assistance - Patient > 75% Supine to Sit: Moderate Assistance - Patient 50-74% Sit to Supine: Moderate Assistance - Patient  50-74% Transfers Sit to Stand: Maximal Assistance - Patient 25-49% Stand to Sit: Moderate Assistance - Patient 50-74%  Upon OT arrival, pt semi recumbent in bed reporting no pain and is requesting to use the bathroom. Pt agreeable to OT session. OT evaluation initiated, educated on role of OT and plan of care. Pt completes toilet transfer and toileting at the levels above and simulates bathing secondary to dizziness reported. Pt able to perform UB/LB dressing at the levels above and was returned to supine position. Pt's BP reading 176/49 and a second reading of 184/49. With time and rest, pt's BP declined to 163/49. Pt performs grooming bed level at the level above and was left in bed at end of session with all needs met and safety measures in place. Pt limited primarily by pain, decreased activity tolerance, dizziness, decreased balance, and weakness and would benefit from OT services to achieve highest level of independence.   Discharge Criteria: Patient will be discharged from OT if patient refuses treatment 3 consecutive times without medical reason, if treatment goals not met, if there is a change in medical status, if patient makes no progress towards goals or if patient is discharged from hospital.  The above assessment, treatment plan, treatment alternatives and goals were discussed and mutually agreed upon: by patient  Marvetta Gibbons 09/29/2022, 2:28 PM

## 2022-09-29 NOTE — Discharge Instructions (Addendum)
Inpatient Rehab Discharge Instructions  Anna Weiss Discharge date and time: 10/29/2022  Activities/Precautions/ Functional Status:  Activity: no lifting, driving, or strenuous exercise until cleared by MD Diet: regular diet Wound Care: keep wound clean and dry Functional status:  ___ No restrictions     ___ Walk up steps independently __x_ 24/7 supervision/assistance   ___ Walk up steps with assistance ___ Intermittent supervision/assistance  ___ Bathe/dress independently ___ Walk with walker     ___ Bathe/dress with assistance ___ Walk Independently    ___ Shower independently ___ Walk with assistance    _x__ Shower with assistance _x__ No alcohol     ___ Return to work/school ________  Special Instructions: No driving, alcohol consumption or tobacco use.   Medical Equipment/Items Ordered: SON WHO WORKS FOR [employer] OBTAINED ALL EQUIPMENT                                                 Agency/Supplier: NA   My questions have been answered and I understand these instructions. I will adhere to these goals and the provided educational materials after my discharge from the hospital.  Patient/Caregiver Signature _______________________________ Date __________  Clinician Signature _______________________________________ Date __________  Please bring this form and your medication list with you to all your follow-up doctor's appointments.    Orthopedic Surgery Discharge Instructions  Patient name: Anna Weiss Procedure Performed: left hip intramedullary nail (09/25/2022), irrigation and debridement left hip wounds (10/09/2022) Surgeon: Ileene Rubens, MD  Diagnosis: left hip intertrochanteric femur fracture Complication: infection that underwent irrigation and debridement  Activity: You are safe to put weight on your left lower extremity. You are encouraged to walk as much as desired. You can perform household activities such as cleaning dishes, doing laundry, vacuuming, etc.  You do not need to wear a brace or splint during the post-operative period.   Incision Care: You can leave your incisions open to air. They do not need a dressing over them. Do not put cream or lotion over the surgical area. It is okay to let soap and water run over your incision. Do not pick, scrub, or rub at the incision when bathing. Let any scabs fall off with time. Do not submerge the wound (e.g., take a bath, swim, go in a hot tub, etc.) until cleared to do so by the office. Keep the wound area clean at all times. If you notice drainage or redness around the incision, please contact the office.   Diet: You are safe to resume your regular diet after surgery.   Reasons to Call the Office After Surgery: You should feel free to call the office with any concerns or questions you have in the post-operative period, but you should definitely notify the office if you develop: -shortness of breath, chest pain, or trouble breathing -excessive bleeding, drainage, redness, or swelling around the surgical site -fevers, chills, or pain that is getting worse with each passing day -persistent nausea or vomiting -new weakness in the left lower extremity or new numbness/tingling in that leg -other concerns about your surgery  Follow Up Appointments: You should have an office appointment scheduled for approximately 14 days after discharge from the hospital. If you do not remember when this appointment is or do not already have it scheduled, please call the office to schedule.   Office Information:  -Dr. Ileene Rubens -  Phone number: [phone] -Address: [address]       [place], [place] [address]

## 2022-09-30 ENCOUNTER — Inpatient Hospital Stay (HOSPITAL_COMMUNITY): Payer: Medicare Other

## 2022-09-30 DIAGNOSIS — S72142S Displaced intertrochanteric fracture of left femur, sequela: Secondary | ICD-10-CM | POA: Diagnosis not present

## 2022-09-30 DIAGNOSIS — R112 Nausea with vomiting, unspecified: Secondary | ICD-10-CM | POA: Diagnosis not present

## 2022-09-30 MED ORDER — METHOCARBAMOL 500 MG PO TABS
250.0000 mg | ORAL_TABLET | Freq: Three times a day (TID) | ORAL | Status: DC
  Administered 2022-09-30 – 2022-10-18 (×53): 250 mg via ORAL
  Filled 2022-09-30 (×53): qty 1

## 2022-09-30 MED ORDER — BIOTENE DRY MOUTH MT LIQD
15.0000 mL | OROMUCOSAL | Status: DC | PRN
  Administered 2022-09-30: 15 mL via OROMUCOSAL

## 2022-09-30 NOTE — [place] Notes (Signed)
Patient ID: Anna Weiss, female   DOB: 1924/12/23, 88 y.o.   MRN: [id number]  Met with pt and daughter who is present in the room to discuss team conference goals of supervision level and target discharge date of 11/21. Pt hopes she can get the level therapy team thinks she will  She is limited by the pain in her hip and hopes she can wean off of the oxygen. She does not like it and wants it off. Will work on discharge needs and have daughter come in and observe in therapies and then do hands on closer to discharge date.

## 2022-09-30 NOTE — Evaluation (Addendum)
Speech Language Pathology Assessment and Plan  Patient Details  Name: Anna Weiss MRN: [id number] Date of Birth: [birth year]  SLP Diagnosis:  (N/A)  Rehab Potential: Good ELOS: N/A    Today's Date: 09/30/2022 SLP Individual Time: 3762-8315 SLP Individual Time Calculation (min): 56 min   Hospital Problem: Principal Problem:   Fracture, intertrochanteric, left femur, sequela  Past Medical History:  Past Medical History:  Diagnosis Date   High cholesterol    Hypertension    Stroke ([place])    2003   Tachycardia    Past Surgical History:  Past Surgical History:  Procedure Laterality Date   ABDOMINAL HYSTERECTOMY     INTRAMEDULLARY (IM) NAIL INTERTROCHANTERIC Left 09/25/2022   Procedure: INTRAMEDULLARY (IM) NAIL INTERTROCHANTERIC;  Surgeon: Callie Fielding, MD;  Location: [place];  Service: Orthopedics;  Laterality: Left;    Assessment / Plan / Recommendation Clinical Impression Patient Admitting Diagnosis: hip fx s/p IM Nail   History of Present Illness: Pt is 88 year old female with medical hx significant for: HTN, stroke (2003), hyperlipidemia. Pt presented to [facility] on 09/24/22 after a fall at home. Pt hit the left side of her head and left hip; did not have any loss of consciousness. Developed nausea and vomiting on way to hospital. Pt noted to have left auricular hematoma, left shoulder contusion, and left hip pain with shortening of left leg. Left hip x-ray revealed acute comminuted intertrochanteric fx of proximal left femur.  Orthopedics was consulted. Chest x-ray showed left lower lobe consolidation versus pleural effusion. Pt underwent IM Nail of left femur on 09/25/22. Therapy evaluations completed and CIR recommended d/t pt's deficits in functional mobility.   SLP consulted to complete CSE secondary to pt's c/o difficulty with swallowing, specifically dry mouth s/p hip surgery. Per CSE on completed on 11/3, regular diet and thin liquids recommended.  Per  clinical findings, pt presents with functional deglutition. Oral manipulation and A-P transit appears adequate and timely, with no evidence concerning for pharyngeal dysphagia observed across consistencies (thin liquids to regular solid textures). No concerns nor complaints concerning for esophageal dysphagia noted this date. Pt reports that she feels swallowing difficulty has resulted from dry mouth with use of supplemental O2, in addition to difficulty with self-feeding in bed. Benefited from following dry solids with puree.  At this time, pt's oropharyngeal functioning appears to support regular diet consistencies and thin liquids with set-up assistance, as needed. Medications may be given whole with liquids or puree. Recommend pt utilize biotene mouthwash prior to medication administration and meals (at least 30 minutes before meals), and consume meals OOB. Recommend adding sauces and gravies to meals. Additionally, may consider use of nutritional supplementation given poor PO intake; MD and RN made aware of recommendations and updated orders placed in chart. Pt verbalized understanding of information via teach back, and all questions answered to pt's satisfaction. Formal ST intervention nor need for instrumental assessment appears indicated at this time. Please re-consult as needed.   Skilled Therapeutic Interventions          CSE administered. Please see above for details.   SLP Assessment  Patient does not need any further Speech Lanaguage Pathology Services    Recommendations  Recommended Consults: Consider esophageal assessment (if sx's persist) SLP Diet Recommendations: Age appropriate regular solids;Thin Liquid Administration via: Cup;Straw Medication Administration: Whole meds with liquid Supervision: Patient able to self feed Compensations: Slow rate;Small sips/bites Postural Changes and/or Swallow Maneuvers: Out of bed for meals;Seated upright 90 degrees  Oral Care Recommendations: Oral  care BID Patient destination: Home Follow up Recommendations: None Equipment Recommended: None recommended by SLP    SLP Frequency  (N/A)   SLP Duration  SLP Intensity  SLP Treatment/Interventions N/A   (N/A)   (N/A)    Pain No pain reported; NAD  Prior Functioning Cognitive/Linguistic Baseline: Within functional limits Type of Home: House  Lives With: Alone Available Help at Discharge: Family;Available 24 hours/day (daughters stays occasionally) Vocation: Retired  SLP Evaluation Cognition Overall Cognitive Status: Within Functional Limits for tasks assessed Arousal/Alertness: Awake/alert Orientation Level: Oriented X4 Year: 2023 Month: November Day of Week: Correct Attention: Sustained Sustained Attention: Appears intact Memory: Impaired Awareness: Appears intact Problem Solving: Appears intact Safety/Judgment: Appears intact  Comprehension Auditory Comprehension Overall Auditory Comprehension: Appears within functional limits for tasks assessed Expression Expression Primary Mode of Expression: Verbal Verbal Expression Overall Verbal Expression: Appears within functional limits for tasks assessed Oral Motor Oral Motor/Sensory Function Overall Oral Motor/Sensory Function: Within functional limits Motor Speech Overall Motor Speech: Appears within functional limits for tasks assessed Intelligibility: Intelligible Motor Planning: Witnin functional limits Motor Speech Errors: Not applicable  Care Tool Care Tool Cognition Ability to hear (with hearing aid or hearing appliances if normally used Ability to hear (with hearing aid or hearing appliances if normally used): 1. Minimal difficulty - difficulty in some environments (e.g. when person speaks softly or setting is noisy)   Expression of Ideas and Wants Expression of Ideas and Wants: 4. Without difficulty (complex and basic) - expresses complex messages without difficulty and with speech that is clear and  easy to understand   Understanding Verbal and Non-Verbal Content Understanding Verbal and Non-Verbal Content: 4. Understands (complex and basic) - clear comprehension without cues or repetitions  Memory/Recall Ability Memory/Recall Ability : Current season;Location of own room;Staff names and faces;That he or she is in a hospital/hospital unit    Bedside Swallowing Assessment General Date of Onset: 09/24/22 Previous Swallow Assessment: BSE completed on 09/26/2022 - regular diet and thin liquids recommended at that time. Diet Prior to this Study: Regular;Thin liquids Temperature Spikes Noted: No Respiratory Status: Supplemental O2 delivered via (comment) - (1 L) History of Recent Intubation: Yes Length of Intubations (days):  (procedure only) Behavior/Cognition: Alert;Pleasant mood;Cooperative Oral Cavity - Dentition: Adequate natural dentition;Other (Comment) (partial plate) Self-Feeding Abilities: Able to feed self Vision: Functional for self-feeding Patient Positioning: Upright in bed Baseline Vocal Quality: Normal Volitional Cough: Strong Volitional Swallow: Able to elicit  Ice Chips Ice chips: Within functional limits Presentation: Spoon;Self Fed Thin Liquid Thin Liquid: Within functional limits Presentation: Cup;Self Fed;Straw Nectar Thick Nectar Thick Liquid: Not tested Honey Thick Honey Thick Liquid: Not tested Puree Puree: Within functional limits Presentation: Self Fed;Spoon Solid Solid: Within functional limits Presentation: Self Fed BSE Assessment Suspected Esophageal Findings Suspected Esophageal Findings: Belching Risk for Aspiration Impact on safety and function: No limitations Other Related Risk Factors: Deconditioning  Short Term Goals: N/A  Recommendations for other services: None   Discharge Criteria: Patient will be discharged from SLP if patient refuses treatment 3 consecutive times without medical reason, if treatment goals not met, if there is a  change in medical status, if patient makes no progress towards goals or if patient is discharged from hospital.  The above assessment, treatment plan, treatment alternatives and goals were discussed and mutually agreed upon: by patient  Romelle Starcher A Branon Sabine 09/30/2022, 12:30 PM

## 2022-09-30 NOTE — Progress Notes (Signed)
Occupational Therapy Session Note  Patient Details  Name: Anna Weiss MRN: [id number] Date of Birth: July 16, 1925  Today's Date: 09/30/2022 OT Individual Time: 1415-1531 OT Individual Time Calculation (min): 76 min    Short Term Goals: Week 1:  OT Short Term Goal 1 (Week 1): Pt will be Mod A for toileting tasks OT Short Term Goal 2 (Week 1): Pt will be Min A for functional transfers OT Short Term Goal 3 (Week 1): Pt will be Mod A for LB dressing/bathing OT Short Term Goal 4 (Week 1): Pt will require no more than 3 rest breaks  Skilled Therapeutic Interventions/Progress Updates:   Pt in bed asleep upon OT arrival for session and daughter bedside. Lights off in room as per pt preference due to light sensitivity. Pt easily awakened, OT placed head of bed more upright, reports pain in L hip, nausea and overall feeling malaise. Pt on  ltrs O2 via [place] and SpO2 remained 94% and above for session. Pt requested toileting on BSC. OT provided set up of all supplies prior to moving pt. Pt required min A for rolling to R side and mod A for supine to sit with HOB elevated. Sat on Eob for prolonged time, prior to feeling ready to transfer. OT assisted with RW and mod A for SPT on and off 3 in 1 next to bed. Pt required prolonged time seated on commode for voiding and BM. See flowsheets. OT provided all peri and buttocks hygiene with Total A. Pt then able to transfer back to bed and needed total A for Le management back into bed. Nursing student assisted OT to position pt upright in bed. Education on breathing, joint protection, positioning and pain management. Encouraged hydration and nutrition as pt with low appetite. Left pt with dtr bedside with bed exit engaged, nurse call button and needs in reach.   Therapy Documentation Precautions:  Precautions Precautions: Fall, Other (comment) Precaution Comments: monitor O2, orthostatics Restrictions Weight Bearing Restrictions: Yes LLE Weight Bearing: Weight  bearing as tolerated   Therapy/Group: Individual Therapy  [name] 09/30/2022, 7:48 AM

## 2022-09-30 NOTE — Progress Notes (Signed)
PROGRESS NOTE   Subjective/Complaints:  Pt reports every day gets better- swallowing/mouth dryness feels a little better today.   Feels miserable when in bed- likes to get out of bed.  Had multiple BM's with Sorbitol- didn't need SSE- but was "FULL of stool" per pt.     ROS:  Pt denies SOB, abd pain, CP, N/V/C/D, and vision changes   Objective:   No results found. Recent Labs    09/29/22 0535  WBC 11.2*  HGB 7.6*  HCT 22.1*  PLT 195   Recent Labs    09/29/22 0535  NA 136  K 3.5  CL 99  CO2 32  GLUCOSE 112*  BUN 15  CREATININE 0.68  CALCIUM 8.6*    Intake/Output Summary (Last 24 hours) at 09/30/2022 0813 Last data filed at 09/30/2022 0733 Gross per 24 hour  Intake 480 ml  Output --  Net 480 ml        Physical Exam: Vital Signs Blood pressure (!) 148/43, pulse (!) 59, temperature 98.3 F (36.8 C), temperature source Oral, resp. rate 18, height '5\' 7"'$  (1.702 m), weight 72.7 kg, SpO2 98 %.    General: awake, alert, appropriate, sitting up in bed; SLP in room; NAD HENT: conjugate gaze; oropharynx moist- no thrush seen on exam; O2 by [place]; L ear dried blood and bruising CV: regular rate; no JVD Pulmonary: CTA B/L; no W/R/R- good air movement GI: soft, NT, ND, (+)BS- not distended anymore Psychiatric: appropriate Neurological: Ox3 Musculoskeletal:     Cervical back: Normal range of motion.     Comments: Left hip tender, still swollen - bruised- mainly yellow-  Skin:    General: Skin is warm.     Findings: Bruising (arms) present.     Comments: Left hip incision CDI with dry dressing. Left ear as above.  Neurological:     Mental Status: She is alert and oriented to person, place, and time.     Cranial Nerves: No cranial nerve deficit.     Sensory: No sensory deficit.     Comments: Motor: 4-5/5 UE's. RLE 3/5 prox to 5/5 distal. LLE 1-2/5 (pain) to 4/5 distally. DTR's 1+, no abnl tone or ataxia    Assessment/Plan: 1. Functional deficits which require 3+ hours per day of interdisciplinary therapy in a comprehensive inpatient rehab setting. Physiatrist is providing close team supervision and 24 hour management of active medical problems listed below. Physiatrist and rehab team continue to assess barriers to discharge/monitor patient progress toward functional and medical goals  Care Tool:  Bathing    Body parts bathed by patient: Right arm, Left arm, Chest, Abdomen   Body parts bathed by helper: Buttocks, Left lower leg, Right lower leg, Face, Front perineal area     Bathing assist Assist Level: Moderate Assistance - Patient 50 - 74%     Upper Body Dressing/Undressing Upper body dressing   What is the patient wearing?: Pull over shirt    Upper body assist Assist Level: Supervision/Verbal cueing    Lower Body Dressing/Undressing Lower body dressing      What is the patient wearing?: Pants, Underwear/pull up     Lower body assist Assist for lower  body dressing: Maximal Assistance - Patient 25 - 49%     Toileting Toileting    Toileting assist Assist for toileting: Maximal Assistance - Patient 25 - 49%     Transfers Chair/bed transfer  Transfers assist  Chair/bed transfer activity did not occur: Safety/medical concerns (dizziness, pain, decreased balance)  Chair/bed transfer assist level: Maximal Assistance - Patient 25 - 49%     Locomotion Ambulation   Ambulation assist   Ambulation activity did not occur: Safety/medical concerns (dizziness, pain, decreased balance)          Walk 10 feet activity   Assist  Walk 10 feet activity did not occur: Safety/medical concerns (dizziness, pain, decreased balance)        Walk 50 feet activity   Assist Walk 50 feet with 2 turns activity did not occur: Safety/medical concerns (dizziness, pain, decreased balance)         Walk 150 feet activity   Assist Walk 150 feet activity did not occur:  Safety/medical concerns (dizziness, pain, decreased balance)         Walk 10 feet on uneven surface  activity   Assist Walk 10 feet on uneven surfaces activity did not occur: Safety/medical concerns (dizziness, pain, decreased balance)         Wheelchair     Assist Is the patient using a wheelchair?: Yes Type of Wheelchair: Manual Wheelchair activity did not occur: Safety/medical concerns (dizziness, pain, decreased balance)         Wheelchair 50 feet with 2 turns activity    Assist    Wheelchair 50 feet with 2 turns activity did not occur: Safety/medical concerns (dizziness, pain, decreased balance)       Wheelchair 150 feet activity     Assist  Wheelchair 150 feet activity did not occur: Safety/medical concerns (dizziness, pain, decreased balance)       Blood pressure (!) 148/43, pulse (!) 59, temperature 98.3 F (36.8 C), temperature source Oral, resp. rate 18, height '5\' 7"'$  (1.702 m), weight 72.7 kg, SpO2 98 %.  Medical Problem List and Plan: 1. Functional deficits secondary to left intertrochanteric femur fx after fall. Pt s/p IMN 09/25/22             -LLE WBAT             -patient may shower             -ELOS/Goals: 15-20 days  Con't CIR- PT, and OT- ordered SLP for possible swallowing issues- team conference today to determine length of stay 2.  Antithrombotics: -DVT/anticoagulation:  xarelto '10mg'$  daily for 14 days             -antiplatelet therapy:  resume plavix '75mg'$  daily 3. Pain Management: tylenol and hydrocodone prn for pain             -robaxin prn for muscle spasms 4. Mood/Behavior/Sleep: team to provide ego support             -antipsychotic agents: n/a 5. Neuropsych/cognition: This patient is capable of making decisions on her own behalf. 6. Skin/Wound Care: dry dressings to left ear and hip 7. Fluids/Electrolytes/Nutrition: encourage appropriate PO             -pt's intake has inconsistent so far 8. Nausea:              -might be  positional or med related.              -also having associated gas             -  pt told me she's been moving bowels, however I don't see a bm recorded             -will try sorbitol today, dulcolax suppository prn             -protonix 9. Left lower lobe consolidation             -pneumonia ruled out. Likely atelectasis             -IS, OOB             -wean oxygen to off  11/7- will write order to wean O2.  10. Sinus bradycardia:             -on propranolol and verapamil at home             -continue to hold as HR in 50's 11. Essential HTN             -continue hctz             -keep an eye on BUN/Cr/electrolytes 12. Right middle lobe nodule 2.3 cm- should be considered primary lung CA until ruled out- also has nodule as well 18m on R middle lung- could be mets per CT scan.              -f/u as outpt  11/7- d/w daughter- she said no f/u at this time- due to pt's age.   13. Mild leukocytosis- 11.2   11/6- afebrile- other than difficulty swallowing- says it's dry?; denies UTI Sx's- if gets worse clinically, will recheck in AM- right now scheduled for Wed AM.  14. Constipation  11/6- LBM on day of fall per pt and chart- will give Sorbitol after therapy and follow with SSE if needed.   11/7- had multiple BM's- didn't need SSE- per notes- feels cleaned out  15. ABLA  11/6- Down to 7.6- will recheck Wednesday to make sure not dropping further- and transfuse if required.    I spent a total of 41   minutes on total care today- >50% coordination of care- due to team conference to determine length of stay and speaking with SLP-   LOS: 2 days A FACE TO FACE EVALUATION WAS PERFORMED  Hercules Hasler 09/30/2022, 8:13 AM

## 2022-09-30 NOTE — Patient Care Conference (Signed)
Inpatient RehabilitationTeam Conference and Plan of Care Update Date: 09/30/2022   Time: 11:45 AM   Patient Name: Anna Weiss      Medical Record Number: [id number]  Date of Birth: 1925/09/07 Sex: Female         Room/Bed: 4M07C/4M07C-01 Payor Info: Payor: MEDICARE / Plan: MEDICARE PART A AND B / Product Type: *No Product type* /    Admit Date/Time:  09/28/2022  4:56 PM  Primary Diagnosis:  Fracture, intertrochanteric, left femur, sequela  Hospital Problems: Principal Problem:   Fracture, intertrochanteric, left femur, sequela    Expected Discharge Date: Expected Discharge Date: 10/14/22  Team Members Present: Physician leading conference: Dr. Courtney Heys Social Worker Present: Ovidio Kin, LCSW Nurse Present: Tacy Learn, RN PT Present: [name], PT OT Present: Jamey Ripa, OT PPS Coordinator present : Gunnar Fusi, SLP     Current Status/Progress Goal Weekly Team Focus  Bowel/Bladder   cont vs incont of B/B. last BM-11/6   regain full cont.   assess q shift and PRN    Swallow/Nutrition/ Hydration               ADL's   min A UB self care, max A LB self care, max A toilet transfers   24 hr S and HHOT   progress BADL's, mobility, pain management    Mobility   mod bed mobility and squat pivot transfer, max A sit to stand   supervision  bed mobility, transfers, gait, pt/fam education    Communication                Safety/Cognition/ Behavioral Observations               Pain   pain 8 of 10 to L hip   pain<3   assess pain q shift and PRN    Skin   surgical incision to l hip (staples), L ear trauma, multiple eccymotic areas.   proper healing  assess q shift and PRN      Discharge Planning:  Home to her home with daughter assisting, daughter wants to see how much care she will before committing to home.   Team Discussion: Left hip fracture. Continent B/B most of the time. Constipation. Medication added. Pain managed with tylenol  and relaxant. Will not take Norco for pain. Incision has staples with minimal amount of drainage. Ween O2 if possible. Has tumor x2 right lung. Out of bed to eat with every meal. Will check hbg tomorrow. Mod/max assist with supv goals.  Patient on target to meet rehab goals: Evals completed Monday.   *See Care Plan and progress notes for long and short-term goals.   Revisions to Treatment Plan:  Medication adjustments, monitor labs, OOB for meals, humidification with oxygen, encourage fluids prior to taking pills Teaching Needs: Medications, safety, skin/wound care, gait/transfer training, etc.   Current Barriers to Discharge: Incontinence, Lack of/limited family support, Weight bearing restrictions, Pending chemo/radiation, Oxygen Requirement , and Self-care education   Possible Resolutions to Barriers: Family education, nursing education, order recommended DME     Medical Summary Current Status: Hb 7.6- rechecking in AM- WBC slightly elevate-d likely the constipation- won't take Norco- only robaxin- and tylenol- said fluid fillin gup in face? added humidification- for O2- might have diffuclty to wean due to R lung tumor x2-  Barriers to Discharge: Decreased family/caregiver support;Home enviroment access/layout;Medical stability;Neurogenic Bowel & Bladder;Weight bearing restrictions;New oxygen;Wound care  Barriers to Discharge Comments: daughter said will help if doesn't require a  lot of A- woozy with therapy- MAx A LB- goals- supervision Possible Resolutions to [employer] Focus: add flonase for wooziness- and Wean o2 as tolerated-  11/21   Continued Need for Acute Rehabilitation Level of Care: The patient requires daily medical management by a physician with specialized training in physical medicine and rehabilitation for the following reasons: Direction of a multidisciplinary physical rehabilitation program to maximize functional independence : Yes Medical management of patient  stability for increased activity during participation in an intensive rehabilitation regime.: Yes Analysis of laboratory values and/or radiology reports with any subsequent need for medication adjustment and/or medical intervention. : Yes   I attest that I was present, lead the team conference, and concur with the assessment and plan of the team.   Ernest Pine 09/30/2022, 5:03 PM

## 2022-09-30 NOTE — Progress Notes (Signed)
Physical Therapy Session Note  Patient Details  Name: Anna Weiss MRN: [id number] Date of Birth: February 19, 1925  Today's Date: 09/30/2022 PT Individual Time: 0905-0946 PT Individual Time Calculation (min): 41 min   Short Term Goals: Week 1:  PT Short Term Goal 1 (Week 1): pt will transfer supine<>sitting EOB with min A consistantly PT Short Term Goal 2 (Week 1): pt will transfer sit<>stand with LRAD and mod A PT Short Term Goal 3 (Week 1): pt will transfer bed<>WC with LRAD and mod A  Skilled Therapeutic Interventions/Progress Updates:       Therapy Documentation Precautions:  Precautions Precautions: Fall, Other (comment) Precaution Comments: monitor O2, orthostatics Restrictions Weight Bearing Restrictions: Yes LLE Weight Bearing: Weight bearing as tolerated  Pt received semi-reclined in bed with nursing student present and reports unrated L hip pain with movement. PT provided pt with rest breaks and repositioning for relief. Pt received on 1 L of O2 and pt able to be weaned to RA in supine with SPO2 > 94. Pt requires mod A to transition from supine to sit edge of bed and SPO2 >90 on RA with intermittent desaturates to 85 which improve within less than 5 seconds with pursed lip breathing. Pt reports feeling "woozy" and BP 146/43 (73). Pt agreeable to transfer to w/c to increase comfort and PT titrated O2 to 0.5 L for mobility. Pt mod A with squat pivot transfer to w/c and SPO2 >94 on 0.5 L of O2. PA arrived for rounding and pt left seated in w/c at bedside with chair alarm on and all needs within reach.    Therapy/Group: Individual Therapy  [name] [name] PT, DPT  09/30/2022, 7:48 AM

## 2022-09-30 NOTE — Progress Notes (Addendum)
Asked to inquire about gauze bolster on left ear. Patient currently OOB to [facility] with PT and complaining of a "weird" feeling in er head. She denies pain, vision changes, nausea, dizziness, CP. She is requiring 0.5 L of O2 via [place] to keep sats in low 90s. She is awake alert with stable VS. Pulse is regular. Left ear appears to be healing without signs of infection. She received Robaxin 500 mg at 0924. She received two doses yesterday. Will try to find out how much longer she need the bolster and decrease dose of Robaxin. Continue to monitor.

## 2022-09-30 NOTE — Progress Notes (Signed)
Occupational Therapy Session Note  Patient Details  Name: Anna Weiss MRN: [id number] Date of Birth: [birth year]  Today's Date: 09/30/2022 OT Individual Time: 3086-5784 OT Individual Time Calculation (min): 59 min    Short Term Goals: Week 1:  OT Short Term Goal 1 (Week 1): Pt will be Mod A for toileting tasks OT Short Term Goal 2 (Week 1): Pt will be Min A for functional transfers OT Short Term Goal 3 (Week 1): Pt will be Mod A for LB dressing/bathing OT Short Term Goal 4 (Week 1): Pt will require no more than 3 rest breaks  Skilled Therapeutic Interventions/Progress Updates: Patient seen for OT working on self care skills and functional transfers. See grid for functional levels. Patient received sitting up in w/c with report of fatigue but agreeable to washing up and dressing. Performed grooming and sponge bath sitting up at the sink. Provided assist with cleaning dental bridge due to low position at the sink and patient's difficulty with forward flexion to reach for the sink. Required max/total assist for washing LE's but able to wash face and upper body without assist or significant cuing. Patient incontinent of bladder and bowel due to urgency, required total assist to clean up brief and perform pericare while standing with the RW.  Patient assisted back to bed following ADL. Good participation. Continue with skilled OT POC.     Therapy Documentation Precautions:  Precautions Precautions: Fall, Other (comment) Precaution Comments: monitor O2, orthostatics Restrictions Weight Bearing Restrictions: Yes LLE Weight Bearing: Weight bearing as tolerated General:   Vital Signs:112/52 sitting in w/c O2 @ 96% with O2 via .  Pain: Pain Assessment Pain Scale: 0-10 Pain Score: 0-No pain Pain Type: Acute pain Pain Location: Ear Pain Orientation: Left Pain Descriptors / Indicators: Sharp Pain Frequency: Intermittent Pain Onset: Other (Comment) (when the patient turns her head and the  sutured gauze hits the pillow) Pain Intervention(s): Medication (See eMAR);RN made aware ADL: ADL Eating: Set up Where Assessed-Eating: Bed level Grooming: min assist Where Assessed-Grooming: sink Upper Body Bathing: Supervision/safety Where Assessed-Upper Body Bathing: sink Lower Body Bathing: Max/total assist Where Assessed-Lower Body Bathing: w/c level at the sink Upper Body Dressing: Supervision/safety Where Assessed-Upper Body Dressing: w/c level Lower Body Dressing: Maximal assistance Where Assessed-Lower Body Dressing: w/c level Toileting: Total  assistance due to urgency/ incontinence        Therapy/Group: Individual Therapy  Hermina Barters 09/30/2022, 12:33 PM

## 2022-10-01 ENCOUNTER — Inpatient Hospital Stay (HOSPITAL_COMMUNITY): Payer: Medicare Other

## 2022-10-01 DIAGNOSIS — S72142S Displaced intertrochanteric fracture of left femur, sequela: Secondary | ICD-10-CM | POA: Diagnosis not present

## 2022-10-01 DIAGNOSIS — R059 Cough, unspecified: Secondary | ICD-10-CM | POA: Diagnosis not present

## 2022-10-01 DIAGNOSIS — J9811 Atelectasis: Secondary | ICD-10-CM | POA: Diagnosis not present

## 2022-10-01 DIAGNOSIS — R911 Solitary pulmonary nodule: Secondary | ICD-10-CM | POA: Diagnosis not present

## 2022-10-01 DIAGNOSIS — J90 Pleural effusion, not elsewhere classified: Secondary | ICD-10-CM | POA: Diagnosis not present

## 2022-10-01 LAB — CBC WITH DIFFERENTIAL/PLATELET
Abs Immature Granulocytes: 0.13 10*3/uL — ABNORMAL HIGH (ref 0.00–0.07)
Basophils Absolute: 0 10*3/uL (ref 0.0–0.1)
Basophils Relative: 0 %
Eosinophils Absolute: 0.2 10*3/uL (ref 0.0–0.5)
Eosinophils Relative: 3 %
HCT: 21 % — ABNORMAL LOW (ref 36.0–46.0)
Hemoglobin: 7.3 g/dL — ABNORMAL LOW (ref 12.0–15.0)
Immature Granulocytes: 1 %
Lymphocytes Relative: 22 %
Lymphs Abs: 2.1 10*3/uL (ref 0.7–4.0)
MCH: 36.7 pg — ABNORMAL HIGH (ref 26.0–34.0)
MCHC: 34.8 g/dL (ref 30.0–36.0)
MCV: 105.5 fL — ABNORMAL HIGH (ref 80.0–100.0)
Monocytes Absolute: 1.2 10*3/uL — ABNORMAL HIGH (ref 0.1–1.0)
Monocytes Relative: 13 %
Neutro Abs: 5.8 10*3/uL (ref 1.7–7.7)
Neutrophils Relative %: 61 %
Platelets: 265 10*3/uL (ref 150–400)
RBC: 1.99 MIL/uL — ABNORMAL LOW (ref 3.87–5.11)
RDW: 14.6 % (ref 11.5–15.5)
WBC: 9.4 10*3/uL (ref 4.0–10.5)
nRBC: 0.2 % (ref 0.0–0.2)

## 2022-10-01 LAB — BASIC METABOLIC PANEL
Anion gap: 7 (ref 5–15)
BUN: 12 mg/dL (ref 8–23)
CO2: 31 mmol/L (ref 22–32)
Calcium: 8.5 mg/dL — ABNORMAL LOW (ref 8.9–10.3)
Chloride: 98 mmol/L (ref 98–111)
Creatinine, Ser: 0.74 mg/dL (ref 0.44–1.00)
GFR, Estimated: >60 mL/min (ref >60)
Glucose, Bld: 98 mg/dL (ref 70–99)
Potassium: 3.2 mmol/L — ABNORMAL LOW (ref 3.5–5.1)
Sodium: 136 mmol/L (ref 135–145)

## 2022-10-01 MED ORDER — GUAIFENESIN 100 MG/5ML PO LIQD
5.0000 mL | ORAL | Status: DC | PRN
  Administered 2022-10-01: 5 mL via ORAL
  Filled 2022-10-01 (×2): qty 5

## 2022-10-01 MED ORDER — SORBITOL 70 % SOLN
30.0000 mL | Freq: Once | Status: AC
  Administered 2022-10-01: 30 mL via ORAL
  Filled 2022-10-01: qty 30

## 2022-10-01 NOTE — Progress Notes (Signed)
Occupational Therapy Session Note  Patient Details  Name: Anna Weiss MRN: [id number] Date of Birth: [birth year]  Today's Date: 10/01/2022 OT Individual Time: 1130-1202, 1445-1531 OT Individual Time Calculation (min): 46 min 32 min    Short Term Goals: Week 1:  OT Short Term Goal 1 (Week 1): Pt will be Mod A for toileting tasks OT Short Term Goal 2 (Week 1): Pt will be Min A for functional transfers OT Short Term Goal 3 (Week 1): Pt will be Mod A for LB dressing/bathing OT Short Term Goal 4 (Week 1): Pt will require no more than 3 rest breaks  Skilled Therapeutic Interventions/Progress Updates:   1st Session:  Pt in bed upon OT arrival with [place] in place and ltrs of O2 titration with 95% SpO2. Pt agreeable to EOB as pt with new dx of ileus as per MD and therapy should try to allow pt to perform what she can tolerate. Max A for L LE management out and into bed with pain ~ 6/10. Continues to feel nauseous and was changed to a liquid diet for now. Pt able to perform light hair combing, face washing and lotion application with set up only and good sitting balance. OT issued and trained in foam grasp cube (medium resistance) for overall CDP and strength mngt for 10 reps Bly 4-6 times per day. Pt then issued yellow (light) tband and instructed in triceps press 10 reps x 2 sets for 2-3 x per day. Pt able to demo both back with min cues. SpO2 remained 95% throughout session. Returned pt to supine at end of session and set bed exit alarm and left nurse call button and needs in reach.    2nd Session:  Pt seen for pm session of OT with dtr bedside and pt up in w/c. Reports having been up ~ 2 hrs and is now on RA. SpO2 sats 93% at rest. Pt requested to use bathroom and OT assisted pt in w/c in and out of bathroom for toilet access with 3 in 1 commode over top. Pt able to SPT with RW to and from toilet with mod A and mod cues for hand placement. Pain overall 6/10 but remains nauseous. Pt able to void and  have small BM after prolonged sitting. OT propped L LE on basin for support. Max A for peri and buttocks hygiene. See Flowsheets for output data. Pt performed hand washing at sink with set up and agreeable to sit up oob longer in recliner. Transferred with RW with mod A and OT positioned pt with LE's elevated. Dtr remained bedside and chair alarm applied, nurse call button and needs placed in reach. O2 sats at end of session 94% on RA.     Therapy Documentation Precautions:  Precautions Precautions: Fall, Other (comment) Precaution Comments: monitor O2, orthostatics Restrictions Weight Bearing Restrictions: Yes LLE Weight Bearing: Weight bearing as tolerated     Therapy/Group: Individual Therapy  [name] 10/01/2022, 6:59 PM

## 2022-10-01 NOTE — Progress Notes (Signed)
Physical Therapy Session Note  Patient Details  Name: Anna Weiss MRN: [id number] Date of Birth: [birth year]  Today's Date: 10/01/2022 PT Individual Time: 0830-0906 PT Individual Time Calculation (min): 36 min  and Today's Date: 10/01/2022 PT Missed Time: 34 Minutes Missed Time Reason: Patient fatigue;Other (Comment) (dizzy)  Short Term Goals: Week 1:  PT Short Term Goal 1 (Week 1): pt will transfer supine<>sitting EOB with min A consistantly PT Short Term Goal 2 (Week 1): pt will transfer sit<>stand with LRAD and mod A PT Short Term Goal 3 (Week 1): pt will transfer bed<>WC with LRAD and mod A  Skilled Therapeutic Interventions/Progress Updates:      Therapy Documentation Precautions:  Precautions Precautions: Fall, Other (comment) Precaution Comments: monitor O2, orthostatics Restrictions Weight Bearing Restrictions: Yes LLE Weight Bearing: Weight bearing as tolerated  Pt received semi-reclined in bed and agreeable to PT session. Pt with unrated left hip pain and was pre-medicated and repositioned for relief. Pt incontinent of bladder on PT arrival and requires min A with rolling in bilateral directions. Pt able to perform peri-care with set-up supine in bed. Pt rolled to the right and performed sidelying to sitting edge of bed with mod A and reported "room spinning", nausea, blurry vision, floating feeling and lightheadedness. BP assessed and recorded as 136/50 (75). Pt reports dizziness onset occurred yesterday 09/30/2022 and is aggravated with positional changes specifically rolling to the right side in bed. Dizziness last > 1 minute and improves when patient closes her eyes. PT performed VBI testing to the left and pt reports increase nausea and was pre-syncopal. PT provided mod A to return pt to lying and notified MD and PA.     Therapy/Group: Individual Therapy  [name] [name] PT, DPT  10/01/2022, 7:27 AM

## 2022-10-01 NOTE — Progress Notes (Signed)
Physical Therapy Session Note  Patient Details  Name: Anna Weiss MRN: [id number] Date of Birth: 1925-05-07  Today's Date: 10/01/2022 PT Individual Time: 1300-1400 PT Individual Time Calculation (min): 60 min   Short Term Goals: Week 1:  PT Short Term Goal 1 (Week 1): pt will transfer supine<>sitting EOB with min A consistantly PT Short Term Goal 2 (Week 1): pt will transfer sit<>stand with LRAD and mod A PT Short Term Goal 3 (Week 1): pt will transfer bed<>WC with LRAD and mod A  Skilled Therapeutic Interventions/Progress Updates: Pt presents semi-reclined in bed, eating lunch.  Pt agreeable to therapy and agrees to sitting in w/c to finish lunch.  Pt rolled side to side w/ min A and siderails for doffing soiled brief, pericare and donning clean brief w/ NT assist.  Pt able to bridge partially using BLE s after L LE assisted to hooklying position, and PT pulling up almost over hips.  Pt required mod A for sup to sit transfer although initiates by moving LE s to EOB.  Pt c/o increased pain w/ activity.  Pt sat EOB stating dizziness.  PT educated on PLB techniques and dizziness resolved.  Pt on .5L O2 and O2 sats remained 94-5% throughout session w/ activity.  Pt transferred sit to stand w/  mod to min A and verbal cues for sequencing.  Pt amb x 4' forward and back x 2w/ min A and verbal cues for sequencing and to improve step length RLE.  Pt on RA and remained 94% w/ activity.  Pt remained sitting in w/c w/ all needs in reach, dtr in room throughout session.     Therapy Documentation Precautions:  Precautions Precautions: Fall, Other (comment) Precaution Comments: monitor O2, orthostatics Restrictions Weight Bearing Restrictions: Yes LLE Weight Bearing: Weight bearing as tolerated General: PT Amount of Missed Time (min): 34 Minutes PT Missed Treatment Reason: Patient fatigue;Other (Comment) (dizzy) Vital Signs:  Pain: 8/10      Therapy/Group: Individual Therapy  [name] 10/01/2022, 2:08 PM

## 2022-10-01 NOTE — Progress Notes (Signed)
PROGRESS NOTE   Subjective/Complaints:  Pt reports still nauseated Slept fair; cannot get comfortable Still nauseated and explained it's due to ileus. .  Educated on ileus and that changing to liquid diet and waiting on NGT.   ROS:  Pt denies SOB, abd pain, CP, (+) N/V/C/D, and vision changes   Objective:   DG Abd 1 View  Result Date: 09/30/2022 CLINICAL DATA:  Nausea. Vomiting. EXAM: ABDOMEN - 1 VIEW COMPARISON:  None Available. FINDINGS: Mild gaseous distension of transverse, distal descending and proximal sigmoid colon. No evidence of small bowel dilatation or obstruction. Calcifications in the right abdomen at the level of L4 on L5 likely represent retroperitoneal phleboliths. No evidence of radiopaque calculi. Retrocardiac opacities likely primarily soft tissue attenuation when compared with recent chest CT. Surgical hardware in the left proximal femur. IMPRESSION: Mild gaseous distension of distal colon is nonspecific but may be seen with colonic ileus. No evidence of obstruction. Electronically Signed   By: Keith Rake M.D.   On: 09/30/2022 19:38   Recent Labs    09/29/22 0535 10/01/22 0617  WBC 11.2* 9.4  HGB 7.6* 7.3*  HCT 22.1* 21.0*  PLT 195 265   Recent Labs    09/29/22 0535 10/01/22 0617  NA 136 136  K 3.5 3.2*  CL 99 98  CO2 32 31  GLUCOSE 112* 98  BUN 15 12  CREATININE 0.68 0.74  CALCIUM 8.6* 8.5*    Intake/Output Summary (Last 24 hours) at 10/01/2022 2620 Last data filed at 09/30/2022 1400 Gross per 24 hour  Intake 120 ml  Output --  Net 120 ml        Physical Exam: Vital Signs Blood pressure (!) 136/101, pulse (!) 52, temperature 98.1 F (36.7 C), temperature source Oral, resp. rate 15, height '5\' 7"'$  (1.702 m), weight 72.7 kg, SpO2 91 %.     General: awake, alert, appropriate, sleepy- sitting up in bed; NAD HENT: conjugate gaze; oropharynx moist CV: mildly bradycardic  rate; no  JVD Pulmonary: CTA B/L; no W/R/R- good air movement- on O2 by [place] 1L to 0.5 L GI: soft, a little more normoactive BS; not distended Psychiatric: appropriate Neurological: Ox3 Musculoskeletal:     Cervical back: Normal range of motion.     Comments: Left hip tender, still swollen - bruised- mainly yellow-  Skin:    General: Skin is warm.     Findings: Bruising (arms) present.     Comments: Left hip incision CDI with dry dressing. Left ear as above. L ear we've removed bolster- still really bruised  Neurological:     Mental Status: She is alert and oriented to person, place, and time.     Cranial Nerves: No cranial nerve deficit.     Sensory: No sensory deficit.     Comments: Motor: 4-5/5 UE's. RLE 3/5 prox to 5/5 distal. LLE 1-2/5 (pain) to 4/5 distally. DTR's 1+, no abnl tone or ataxia   Assessment/Plan: 1. Functional deficits which require 3+ hours per day of interdisciplinary therapy in a comprehensive inpatient rehab setting. Physiatrist is providing close team supervision and 24 hour management of active medical problems listed below. Physiatrist and rehab team continue to  assess barriers to discharge/monitor patient progress toward functional and medical goals  Care Tool:  Bathing    Body parts bathed by patient: Face   Body parts bathed by helper: Buttocks, Left lower leg, Right lower leg, Face, Front perineal area     Bathing assist Assist Level: Moderate Assistance - Patient 50 - 74%     Upper Body Dressing/Undressing Upper body dressing   What is the patient wearing?: Pull over shirt    Upper body assist Assist Level: Contact Guard/Touching assist    Lower Body Dressing/Undressing Lower body dressing      What is the patient wearing?: Incontinence brief, Pants     Lower body assist Assist for lower body dressing: Maximal Assistance - Patient 25 - 49%     Toileting Toileting    Toileting assist Assist for toileting: Total Assistance - Patient < 25%      Transfers Chair/bed transfer  Transfers assist  Chair/bed transfer activity did not occur: Safety/medical concerns (dizziness, pain, decreased balance)  Chair/bed transfer assist level: Maximal Assistance - Patient 25 - 49%     Locomotion Ambulation   Ambulation assist   Ambulation activity did not occur: Safety/medical concerns (dizziness, pain, decreased balance)          Walk 10 feet activity   Assist  Walk 10 feet activity did not occur: Safety/medical concerns (dizziness, pain, decreased balance)        Walk 50 feet activity   Assist Walk 50 feet with 2 turns activity did not occur: Safety/medical concerns (dizziness, pain, decreased balance)         Walk 150 feet activity   Assist Walk 150 feet activity did not occur: Safety/medical concerns (dizziness, pain, decreased balance)         Walk 10 feet on uneven surface  activity   Assist Walk 10 feet on uneven surfaces activity did not occur: Safety/medical concerns (dizziness, pain, decreased balance)         Wheelchair     Assist Is the patient using a wheelchair?: Yes Type of Wheelchair: Manual Wheelchair activity did not occur: Safety/medical concerns (dizziness, pain, decreased balance)         Wheelchair 50 feet with 2 turns activity    Assist    Wheelchair 50 feet with 2 turns activity did not occur: Safety/medical concerns (dizziness, pain, decreased balance)       Wheelchair 150 feet activity     Assist  Wheelchair 150 feet activity did not occur: Safety/medical concerns (dizziness, pain, decreased balance)       Blood pressure (!) 136/101, pulse (!) 52, temperature 98.1 F (36.7 C), temperature source Oral, resp. rate 15, height '5\' 7"'$  (1.702 m), weight 72.7 kg, SpO2 91 %.  Medical Problem List and Plan: 1. Functional deficits secondary to left intertrochanteric femur fx after fall. Pt s/p IMN 09/25/22             -LLE WBAT             -patient may  shower             -ELOS/Goals: 15-20 days  D/c set for 11/21  Con't CIR- PT, and OT 2.  Antithrombotics: -DVT/anticoagulation:  xarelto '10mg'$  daily for 14 days             -antiplatelet therapy:  resume plavix '75mg'$  daily 3. Pain Management: tylenol and hydrocodone prn for pain             -robaxin prn  for muscle spasms  11/8- pain limiting therapy- doesn't want opiates- mainly using tylenol-  4. Mood/Behavior/Sleep: team to provide ego support             -antipsychotic agents: n/a 5. Neuropsych/cognition: This patient is capable of making decisions on her own behalf. 6. Skin/Wound Care: dry dressings to left ear and hip 7. Fluids/Electrolytes/Nutrition: encourage appropriate PO             -pt's intake has inconsistent so far 8. Nausea:              -might be positional or med related.              -also having associated gas             -pt told me she's been moving bowels, however I don't see a bm recorded             -will try sorbitol today, dulcolax suppository prn             -protonix 11/8- has ileus- per #15 below 9. Left lower lobe consolidation             -pneumonia ruled out. Likely atelectasis             -IS, OOB             -wean oxygen to off  11/7- will write order to wean O2.  10. Sinus bradycardia:             -on propranolol and verapamil at home             -continue to hold as HR in 50's 11. Essential HTN             -continue hctz             -keep an eye on BUN/Cr/electrolytes 12. Right middle lobe nodule 2.3 cm- should be considered primary lung CA until ruled out- also has nodule as well 65m on R middle lung- could be mets per CT scan.              -f/u as outpt  11/7- d/w daughter- she said no f/u at this time- due to pt's age.   11/8- daughter doesn't want to let pt know about tumors.   13. Mild leukocytosis- 11.2   11/6- afebrile- other than difficulty swallowing- says it's dry?; denies UTI Sx's- if gets worse clinically, will recheck in AM- right  now scheduled for Wed AM.   11/8- WBC down to 9.4-  14. Constipation  11/6- LBM on day of fall per pt and chart- will give Sorbitol after therapy and follow with SSE if needed.   11/7- had multiple BM's- didn't need SSE- per notes- feels cleaned out  15. ABLA  11/6- Down to 7.6- will recheck Wednesday to make sure not dropping further- and transfuse if required.   11/8- Hb 7.3- on edge of needing transfusion.  130 Post op ileus  11/8- changed to  liquid diet- gave sorbitol after therapy- and will do another KUB tomorrow AM- pt still nauseated- so will con't Zofran.     I spent a total of 41    minutes on total care today- >50% coordination of care- due to IPOC, as well as new ileus.     LOS: 3 days A FACE TO FACE EVALUATION WAS PERFORMED  Mauricio Dahlen 10/01/2022, 8:12 AM

## 2022-10-01 NOTE — Progress Notes (Signed)
RN reported patient with dry cough and requesting prn. I added guaifenesin and will check CXR since requiring a small amount of supplemental O2. Had some infiltrates on CT chest on 11/1. No leukocytosis or fever.

## 2022-10-01 NOTE — IPOC Note (Signed)
Overall Plan of Care [facility]) Patient Details Name: Anna Weiss MRN: [id number] DOB: 1925/11/14  Admitting Diagnosis: Fracture, intertrochanteric, left femur, sequela  Hospital Problems: Principal Problem:   Fracture, intertrochanteric, left femur, sequela     Functional Problem List: Nursing Bowel, Pain, Endurance, Medication Management, Safety, Bladder  PT Balance, Edema, Endurance, Nutrition, Pain, Sensory, Skin Integrity  OT Balance, Cognition, Endurance  SLP Nutrition (due to poor appetite)  TR         Basic ADL's: OT Grooming, Bathing, Dressing, Toileting     Advanced  ADL's: OT Simple Meal Preparation, Light Housekeeping     Transfers: PT Bed Mobility, Bed to Chair, Car, Manufacturing systems engineer, Metallurgist: PT Ambulation, Emergency planning/management officer, Stairs     Additional Impairments: OT None  SLP None      TR      Anticipated Outcomes Item Anticipated Outcome  Self Feeding    Swallowing  N/A   Basic self-care  Media planner Transfers Supervision  Bowel/Bladder  manage bowel w mod I assist and bladder w toileting  Transfers  supervision with LRAD  Locomotion  supervision with LRAD  Communication  N/A  Cognition  N/A  Pain  < 4 with prns  Safety/Judgment  Manage w cues   Therapy Plan: PT Intensity: Minimum of 1-2 x/day ,45 to 90 minutes PT Frequency: 5 out of 7 days PT Duration Estimated Length of Stay: 14-18 days OT Intensity: Minimum of 1-2 x/day, 45 to 90 minutes OT Frequency: 5 out of 7 days OT Duration/Estimated Length of Stay: 2-2.5 weeks SLP Intensity:  (N/A) SLP Frequency:  (N/A) SLP Duration/Estimated Length of Stay: N/A   Team Interventions: Nursing Interventions Bladder Management, Bowel Management, Medication Management, Pain Management, Discharge Planning, Disease Management/Prevention, Patient/Family Education  PT interventions Ambulation/gait training, Discharge planning, Functional  mobility training, Psychosocial support, Therapeutic Activities, Balance/vestibular training, Disease management/prevention, Neuromuscular re-education, Skin care/wound management, Therapeutic Exercise, Wheelchair propulsion/positioning, Cognitive remediation/compensation, DME/adaptive equipment instruction, Pain management, Splinting/orthotics, UE/LE Strength taining/ROM, Academic librarian, Barrister's clerk education, IT trainer, UE/LE Coordination activities  OT Interventions Training and development officer, Barrister's clerk education, Therapeutic Activities, DME/adaptive equipment instruction, Cognitive remediation/compensation, Psychosocial support, Therapeutic Exercise, Community reintegration, Functional mobility training, Self Care/advanced ADL retraining, UE/LE Strength taining/ROM, UE/LE Coordination activities, Neuromuscular re-education, Discharge planning, Pain management  SLP Interventions  (N/A)  TR Interventions    SW/CM Interventions Discharge Planning, Psychosocial Support, Patient/Family Education   Barriers to Discharge MD  Medical stability, Incontinence, Neurogenic bowel and bladder, Wound care, Weight bearing restrictions, and New oxygen  Nursing Decreased caregiver support, Weight bearing restrictions 1 level 1 ste no rails; family will stay 24/7 to assist  PT Home environment access/layout, Incontinence, Wound Care, New oxygen pain, new oxygen, 1 STE  OT      SLP      SW Decreased caregiver support, Lack of/limited family support     Team Discharge Planning: Destination: PT-Home ,OT- Home , SLP-Home Projected Follow-up: PT-Home health PT, OT-  24 hour supervision/assistance, Home health OT, SLP-None Projected Equipment Needs: PT-To be determined, OT- To be determined, SLP-None recommended by SLP Equipment Details: PT-has quad cane, OT-  Patient/family involved in discharge planning: PT- Patient,  OT-Patient, SLP-Patient  MD ELOS: 14-18 days Medical Rehab  Prognosis:  Good Assessment: The patient has been admitted for CIR therapies with the diagnosis of L hip fracture and ileus and ABLA. The team will be addressing functional mobility, strength, stamina, balance, safety, adaptive techniques and  equipment, self-care, bowel and bladder mgt, patient and caregiver education, . Goals have been set at supervision. Anticipated discharge destination is home.        See Team Conference Notes for weekly updates to the plan of care

## 2022-10-02 ENCOUNTER — Inpatient Hospital Stay (HOSPITAL_COMMUNITY): Payer: Medicare Other

## 2022-10-02 DIAGNOSIS — Z8719 Personal history of other diseases of the digestive system: Secondary | ICD-10-CM | POA: Diagnosis not present

## 2022-10-02 DIAGNOSIS — S72142S Displaced intertrochanteric fracture of left femur, sequela: Secondary | ICD-10-CM | POA: Diagnosis not present

## 2022-10-02 LAB — CBC WITH DIFFERENTIAL/PLATELET
Abs Immature Granulocytes: 0.17 10*3/uL — ABNORMAL HIGH (ref 0.00–0.07)
Basophils Absolute: 0 10*3/uL (ref 0.0–0.1)
Basophils Relative: 0 %
Eosinophils Absolute: 0.2 10*3/uL (ref 0.0–0.5)
Eosinophils Relative: 1 %
HCT: 22.8 % — ABNORMAL LOW (ref 36.0–46.0)
Hemoglobin: 8 g/dL — ABNORMAL LOW (ref 12.0–15.0)
Immature Granulocytes: 1 %
Lymphocytes Relative: 19 %
Lymphs Abs: 2.4 10*3/uL (ref 0.7–4.0)
MCH: 36.5 pg — ABNORMAL HIGH (ref 26.0–34.0)
MCHC: 35.1 g/dL (ref 30.0–36.0)
MCV: 104.1 fL — ABNORMAL HIGH (ref 80.0–100.0)
Monocytes Absolute: 1.5 10*3/uL — ABNORMAL HIGH (ref 0.1–1.0)
Monocytes Relative: 12 %
Neutro Abs: 8.3 10*3/uL — ABNORMAL HIGH (ref 1.7–7.7)
Neutrophils Relative %: 67 %
Platelets: 310 10*3/uL (ref 150–400)
RBC: 2.19 MIL/uL — ABNORMAL LOW (ref 3.87–5.11)
RDW: 14.8 % (ref 11.5–15.5)
WBC: 12.6 10*3/uL — ABNORMAL HIGH (ref 4.0–10.5)
nRBC: 0.4 % — ABNORMAL HIGH (ref 0.0–0.2)

## 2022-10-02 LAB — BASIC METABOLIC PANEL
Anion gap: 10 (ref 5–15)
BUN: 13 mg/dL (ref 8–23)
CO2: 29 mmol/L (ref 22–32)
Calcium: 8.8 mg/dL — ABNORMAL LOW (ref 8.9–10.3)
Chloride: 96 mmol/L — ABNORMAL LOW (ref 98–111)
Creatinine, Ser: 0.74 mg/dL (ref 0.44–1.00)
GFR, Estimated: >60 mL/min (ref >60)
Glucose, Bld: 96 mg/dL (ref 70–99)
Potassium: 3.1 mmol/L — ABNORMAL LOW (ref 3.5–5.1)
Sodium: 135 mmol/L (ref 135–145)

## 2022-10-02 LAB — URINALYSIS, ROUTINE W REFLEX MICROSCOPIC
Bilirubin Urine: NEGATIVE
Glucose, UA: NEGATIVE mg/dL
Hgb urine dipstick: NEGATIVE
Ketones, ur: 20 mg/dL — AB
Leukocytes,Ua: NEGATIVE
Nitrite: NEGATIVE
Protein, ur: NEGATIVE mg/dL
Specific Gravity, Urine: 1.013 (ref 1.005–1.030)
pH: 6 (ref 5.0–8.0)

## 2022-10-02 MED ORDER — MAGIC MOUTHWASH
10.0000 mL | Freq: Four times a day (QID) | ORAL | Status: DC
  Administered 2022-10-02 – 2022-10-28 (×75): 10 mL via ORAL
  Filled 2022-10-02 (×112): qty 10

## 2022-10-02 MED ORDER — POTASSIUM CHLORIDE 20 MEQ PO PACK
40.0000 meq | PACK | Freq: Two times a day (BID) | ORAL | Status: AC
  Administered 2022-10-02 (×2): 40 meq via ORAL
  Filled 2022-10-02 (×2): qty 2

## 2022-10-02 MED ORDER — POTASSIUM CHLORIDE CRYS ER 20 MEQ PO TBCR: 40.0000 meq | EXTENDED_RELEASE_TABLET | Freq: Two times a day (BID) | ORAL | Status: DC

## 2022-10-02 MED ORDER — TRAMADOL HCL 50 MG PO TABS
50.0000 mg | ORAL_TABLET | Freq: Four times a day (QID) | ORAL | Status: DC | PRN
  Administered 2022-10-02 – 2022-10-14 (×17): 50 mg via ORAL
  Filled 2022-10-02 (×20): qty 1

## 2022-10-02 NOTE — Progress Notes (Signed)
Physical Therapy Session Note  Patient Details  Name: Anna Weiss MRN: [id number] Date of Birth: Feb 07, 1925  Today's Date: 10/02/2022 PT Individual Time: 0902-1002, [id number]  PT Individual Time Calculation (min): 60 min , 13 minutes  Missed Time: 62 minutes (fatigue)   Short Term Goals: Week 1:  PT Short Term Goal 1 (Week 1): pt will transfer supine<>sitting EOB with min A consistantly PT Short Term Goal 2 (Week 1): pt will transfer sit<>stand with LRAD and mod A PT Short Term Goal 3 (Week 1): pt will transfer bed<>WC with LRAD and mod A  Skilled Therapeutic Interventions/Progress Updates:      Therapy Documentation Precautions:  Precautions Precautions: Fall, Other (comment) Precaution Comments: monitor O2, orthostatics Restrictions Weight Bearing Restrictions: Yes LLE Weight Bearing: Weight bearing as tolerated  Treatment Session 1:  Pt received semi-reclined in bed agreeable to PT session with emphasis on transfer training. Pt reports 6/10 left LE pain and was pre-medicated prior to session. Pt also complains of nausea. Pt incontinent on PT arrival and requires mod A with rolling in bilateral directions and was dependent for peri-care. Pt requires mod A with supine to sit and supervision for static sitting balance. Pt provided verbal and visual cues for steps of squat pivot transfer and pt performed with mod A. Pt propelled w/c ~50 ft with close supervision and requires increased time due to decreased strength and activity tolerance. Pt transported remaining distance to main gym for time mangagement and energy conservation. Pt requires mod A with sit to stand with RW and verbal cues for sequencing and hand placement. Pt ambulated 20 ft with min A and max A for recovery following right knee buckle. Pt required seated rest break and SPO2 > 90 on RA. Pt requested to return to room and experienced incontinent episode. Pt left in care of nursing.   Treatment Session 2:  Pt received  seated in recliner at bedside with NT present for vitals assessment. Pt reports no pain at rest but anticipates increased pain with mobility and nursing notified and provided pain medication. Pt lethargic and continued to fall asleep during conversation. Pt unable to engage in physical therapy session secondary to fatigue. Plan to make up missed minutes as able.    Therapy/Group: Individual Therapy  [name] [name] PT, DPT  10/02/2022, 7:52 AM

## 2022-10-02 NOTE — Progress Notes (Signed)
Occupational Therapy Session Note  Patient Details  Name: [name] MRN: [id number] Date of Birth: 1925-02-19  Today's Date: 10/02/2022 OT Individual Time: 1102-1200 OT Individual Time Calculation (min): 58 min    Short Term Goals: Week 1:  OT Short Term Goal 1 (Week 1): Pt will be Mod A for toileting tasks OT Short Term Goal 2 (Week 1): Pt will be Min A for functional transfers OT Short Term Goal 3 (Week 1): Pt will be Mod A for LB dressing/bathing OT Short Term Goal 4 (Week 1): Pt will require no more than 3 rest breaks  Skilled Therapeutic Interventions/Progress Updates:   Pt seen for AM self care retraining visit. Pt with reports of having a good session with PT in the gym but now somewhat fatigued, with L LE hip pain 6/10 and some nausea. Pt agreeable to modified sponge bathing, dressing and grooming EOB. Moved to EOB from supine with mod A for L LE mngt. EOB with S only for oral and denture care, UB bathing and dressing. Stood for changing incontinence brief with RW with min A and peri hygiene with mod a. Transferred to recliner and completed 3 sets of seated push ups with rests and cues for breathing between sets. SpO2 remained 94-96% on RA. OT prepared hot broth and pt able to take small bites of 50% of cup throughout session without nausea exacerbation. MD arrived during session to encourage upright eating and hydration- OT reinforced. Left pt in recliner with LE's elevated, chair exit alarm set and all needs and call button in reach.    Therapy Documentation Precautions:  Precautions Precautions: Fall, Other (comment) Precaution Comments: monitor O2, orthostatics Restrictions Weight Bearing Restrictions: Yes LLE Weight Bearing: Weight bearing as tolerated    Therapy/Group: Individual Therapy  [name] 10/02/2022, 7:46 AM

## 2022-10-02 NOTE — Progress Notes (Signed)
PROGRESS NOTE   Subjective/Complaints:  SLP has been d/c'd- since swallowing issues are dry mouth.   Nausea still a big issue- wants clear liquid diet-  Having oozing from L hip- Rough night- cannot get comfortable-   LBM yesterday with sorbitol, but nausea - feels like will vomit if eats something.  Also food tastes bad/weird; and mouth SO DRY.   KUB shows resolution of ileus.  Scared if takes pain meds, will code. Per pt.  Educated that can try tramadol- milder but still can help pain more.    ROS:   Pt denies SOB, abd pain, CP, N/V/C/D, and vision changes   Objective:   DG Abd 1 View  Result Date: 10/02/2022 CLINICAL DATA:  Ileus EXAM: ABDOMEN - 1 VIEW COMPARISON:  Portable exam 0510 hours compared to 09/30/2022 FINDINGS: Scattered gas in nondistended colon. Normal small bowel gas pattern. No bowel dilatation or bowel wall thickening. Questionable calcific density projects over the RIGHT renal silhouette, cannot exclude 11 x 6 mm RIGHT renal calculus though this is not definitely seen on the prior study. No acute osseous findings. IMPRESSION: Nonobstructive bowel gas pattern without bowel dilatation. Questionable 11 x 6 mm RIGHT renal calculus versus artifact. Electronically Signed   By: Lavonia Dana M.D.   On: 10/02/2022 08:17   DG Chest 2 View  Result Date: 10/01/2022 CLINICAL DATA:  Cough EXAM: CHEST - 2 VIEW COMPARISON:  CT 09/24/2022 FINDINGS: Stable enlarged cardiac silhouette. There is LEFT lower lobe opacity similar comparison exam. RIGHT lung nodule measuring 2.4 cm unchanged from recent CT. No pneumothorax. Degenerative osteophytosis of the spine. IMPRESSION: 1. Stable LEFT basilar atelectasis and effusion. 2. Stable large RIGHT pulmonary nodule. Recommend further evaluation as recommended on comparison CT. 3. No interval change. Electronically Signed   By: Suzy Bouchard M.D.   On: 10/01/2022 15:17   DG Abd 1  View  Result Date: 09/30/2022 CLINICAL DATA:  Nausea. Vomiting. EXAM: ABDOMEN - 1 VIEW COMPARISON:  None Available. FINDINGS: Mild gaseous distension of transverse, distal descending and proximal sigmoid colon. No evidence of small bowel dilatation or obstruction. Calcifications in the right abdomen at the level of L4 on L5 likely represent retroperitoneal phleboliths. No evidence of radiopaque calculi. Retrocardiac opacities likely primarily soft tissue attenuation when compared with recent chest CT. Surgical hardware in the left proximal femur. IMPRESSION: Mild gaseous distension of distal colon is nonspecific but may be seen with colonic ileus. No evidence of obstruction. Electronically Signed   By: Keith Rake M.D.   On: 09/30/2022 19:38   Recent Labs    10/01/22 0617 10/02/22 0614  WBC 9.4 12.6*  HGB 7.3* 8.0*  HCT 21.0* 22.8*  PLT 265 310   Recent Labs    10/01/22 0617 10/02/22 0614  NA 136 135  K 3.2* 3.1*  CL 98 96*  CO2 31 29  GLUCOSE 98 96  BUN 12 13  CREATININE 0.74 0.74  CALCIUM 8.5* 8.8*    Intake/Output Summary (Last 24 hours) at 10/02/2022 0900 Last data filed at 10/01/2022 1858 Gross per 24 hour  Intake 125 ml  Output --  Net 125 ml  Physical Exam: Vital Signs Blood pressure (!) 140/49, pulse (!) 52, temperature 98.2 F (36.8 C), temperature source Oral, resp. rate 20, height '5\' 7"'$  (1.702 m), weight 72.7 kg, SpO2 94 %.      General: awake, alert, appropriate, looks more ill; sitting up in bed- cannot eat grits;  NAD HENT: conjugate gaze; oropharynx very dry, tongue furrowed- don't see any thrush plaques though CV: bradycardic rate; no JVD Pulmonary: CTA B/L; no W/R/R- good air movement GI: soft, mild TTP- somewhat distended; normoactive BS Psychiatric: appropriate Neurological: Ox3 Musculoskeletal:     Cervical back: Normal range of motion.     Comments: Left hip tender, still swollen - bruised- mainly yellow-  Skin:    General: Skin is  warm.     Findings: Bruising (arms) present.     Comments: Left hip incision CDI with dry dressing. Left ear as above. L ear we've removed bolster- still really bruised  Neurological:     Mental Status: She is alert and oriented to person, place, and time.     Cranial Nerves: No cranial nerve deficit.     Sensory: No sensory deficit.     Comments: Motor: 4-5/5 UE's. RLE 3/5 prox to 5/5 distal. LLE 1-2/5 (pain) to 4/5 distally. DTR's 1+, no abnl tone or ataxia   Assessment/Plan: 1. Functional deficits which require 3+ hours per day of interdisciplinary therapy in a comprehensive inpatient rehab setting. Physiatrist is providing close team supervision and 24 hour management of active medical problems listed below. Physiatrist and rehab team continue to assess barriers to discharge/monitor patient progress toward functional and medical goals  Care Tool:  Bathing    Body parts bathed by patient: Face   Body parts bathed by helper: Buttocks, Left lower leg, Right lower leg, Face, Front perineal area     Bathing assist Assist Level: Moderate Assistance - Patient 50 - 74%     Upper Body Dressing/Undressing Upper body dressing   What is the patient wearing?: Pull over shirt    Upper body assist Assist Level: Contact Guard/Touching assist    Lower Body Dressing/Undressing Lower body dressing      What is the patient wearing?: Incontinence brief, Pants     Lower body assist Assist for lower body dressing: Maximal Assistance - Patient 25 - 49%     Toileting Toileting    Toileting assist Assist for toileting: Maximal Assistance - Patient 25 - 49%     Transfers Chair/bed transfer  Transfers assist  Chair/bed transfer activity did not occur: Safety/medical concerns (dizziness, pain, decreased balance)  Chair/bed transfer assist level: Moderate Assistance - Patient 50 - 74%     Locomotion Ambulation   Ambulation assist   Ambulation activity did not occur:  Safety/medical concerns (dizziness, pain, decreased balance)  Assist level: Minimal Assistance - Patient > 75% Assistive device: Walker-rolling Max distance: 4   Walk 10 feet activity   Assist  Walk 10 feet activity did not occur: Safety/medical concerns (dizziness, pain, decreased balance)        Walk 50 feet activity   Assist Walk 50 feet with 2 turns activity did not occur: Safety/medical concerns (dizziness, pain, decreased balance)         Walk 150 feet activity   Assist Walk 150 feet activity did not occur: Safety/medical concerns (dizziness, pain, decreased balance)         Walk 10 feet on uneven surface  activity   Assist Walk 10 feet on uneven surfaces activity did not  occur: Safety/medical concerns (dizziness, pain, decreased balance)         Wheelchair     Assist Is the patient using a wheelchair?: Yes Type of Wheelchair: Manual Wheelchair activity did not occur: Safety/medical concerns (dizziness, pain, decreased balance)         Wheelchair 50 feet with 2 turns activity    Assist    Wheelchair 50 feet with 2 turns activity did not occur: Safety/medical concerns (dizziness, pain, decreased balance)       Wheelchair 150 feet activity     Assist  Wheelchair 150 feet activity did not occur: Safety/medical concerns (dizziness, pain, decreased balance)       Blood pressure (!) 140/49, pulse (!) 52, temperature 98.2 F (36.8 C), temperature source Oral, resp. rate 20, height '5\' 7"'$  (1.702 m), weight 72.7 kg, SpO2 94 %.  Medical Problem List and Plan: 1. Functional deficits secondary to left intertrochanteric femur fx after fall. Pt s/p IMN 09/25/22             -LLE WBAT             -patient may shower             -ELOS/Goals: 15-20 days  D/c set for 11/21  Con't CIR- PT, and OT- wait on vestibular treatment for moment since still nauseated at rest.  2.  Antithrombotics: -DVT/anticoagulation:  xarelto '10mg'$  daily for 14 days              -antiplatelet therapy:  resume plavix '75mg'$  daily 3. Pain Management: tylenol and hydrocodone prn for pain             -robaxin prn for muscle spasms  11/8- pain limiting therapy- doesn't want opiates- mainly using tylenol-   11/9- d/w pt- she's willing to try tramadol 50 mg q6 hours prn- she's scared of Coding if takes Norco- I explained that Tramadol is mild and shouldn't cause any cognitive issues.  4. Mood/Behavior/Sleep: team to provide ego support             -antipsychotic agents: n/a 5. Neuropsych/cognition: This patient is capable of making decisions on her own behalf. 6. Skin/Wound Care: dry dressings to left ear and hip  11/9- having some drainage- from L hip- is improved this Am- will monitor closely.  7. Fluids/Electrolytes/Nutrition: encourage appropriate PO             -pt's intake has inconsistent so far 8. Nausea:              -might be positional or med related.              -also having associated gas             -pt told me she's been moving bowels, however I don't see a bm recorded             -will try sorbitol today, dulcolax suppository prn             -protonix 11/8- has ileus- per #15 below  11/9- still having nausea- will change diet to clear liquid per pt request- and monitor 9. Left lower lobe consolidation             -pneumonia ruled out. Likely atelectasis             -IS, OOB             -wean oxygen to off  11/7- will write order to wean O2.  10.  Sinus bradycardia:             -on propranolol and verapamil at home             -continue to hold as HR in 50's 11. Essential HTN             -continue hctz             -keep an eye on BUN/Cr/electrolytes 12. Right middle lobe nodule 2.3 cm- should be considered primary lung CA until ruled out- also has nodule as well 80m on R middle lung- could be mets per CT scan.              -f/u as outpt  11/7- d/w daughter- she said no f/u at this time- due to pt's age.   11/8- daughter doesn't want to let  pt know about tumors.   13. Mild leukocytosis- 11.2   11/6- afebrile- other than difficulty swallowing- says it's dry?; denies UTI Sx's- if gets worse clinically, will recheck in AM- right now scheduled for Wed AM.   11/8- WBC down to 9.4-  14. Constipation  11/6- LBM on day of fall per pt and chart- will give Sorbitol after therapy and follow with SSE if needed.   11/7- had multiple BM's- didn't need SSE- per notes- feels cleaned out  15. ABLA  11/6- Down to 7.6- will recheck Wednesday to make sure not dropping further- and transfuse if required.   11/8- Hb 7.3- on edge of needing transfusion.   11/9- Hb up to 8.0-  16. Post op ileus  11/8- changed to  liquid diet- gave sorbitol after therapy- and will do another KUB tomorrow AM- pt still nauseated- so will con't Zofran.  11/9- feels nauseated- ileus has resolved-   17. Thrush?  11/9- will add Magic mouthwash- don't see plaques, but tongue dry and furrowed. Says things taste 'wierd".  18. Leukocytosis  11/9- will check U/A - if (+), will check urine Cx.    I spent a total of 51   minutes on total care today- >50% coordination of care- due to prolonged d/w pt as well as nursing about medical issues as detailed above.  Will wait on IM consult or GI consult, but if not better tomorrow, will consult.    LOS: 4 days A FACE TO FACE EVALUATION WAS PERFORMED  Anna Weiss 10/02/2022, 9:00 AM

## 2022-10-02 NOTE — Progress Notes (Signed)
Slept fairly well last night. Position changes maintained for comfort. Sounded a bit low in mood. Told Nurse Tech she feels her time to exit is getting close. Reassured she is doing well. Swallowing evaluation to be done today. Takes pills one at a time with sips of water. Still passing gas. Denies nausea and pain.Safety maintained at all times.

## 2022-10-03 DIAGNOSIS — S72142S Displaced intertrochanteric fracture of left femur, sequela: Secondary | ICD-10-CM | POA: Diagnosis not present

## 2022-10-03 LAB — CBC WITH DIFFERENTIAL/PLATELET
Abs Immature Granulocytes: 0.14 10*3/uL — ABNORMAL HIGH (ref 0.00–0.07)
Basophils Absolute: 0 10*3/uL (ref 0.0–0.1)
Basophils Relative: 0 %
Eosinophils Absolute: 0.3 10*3/uL (ref 0.0–0.5)
Eosinophils Relative: 3 %
HCT: 23 % — ABNORMAL LOW (ref 36.0–46.0)
Hemoglobin: 7.7 g/dL — ABNORMAL LOW (ref 12.0–15.0)
Immature Granulocytes: 1 %
Lymphocytes Relative: 24 %
Lymphs Abs: 2.4 10*3/uL (ref 0.7–4.0)
MCH: 36.5 pg — ABNORMAL HIGH (ref 26.0–34.0)
MCHC: 33.5 g/dL (ref 30.0–36.0)
MCV: 109 fL — ABNORMAL HIGH (ref 80.0–100.0)
Monocytes Absolute: 1.3 10*3/uL — ABNORMAL HIGH (ref 0.1–1.0)
Monocytes Relative: 13 %
Neutro Abs: 5.8 10*3/uL (ref 1.7–7.7)
Neutrophils Relative %: 59 %
Platelets: 320 10*3/uL (ref 150–400)
RBC: 2.11 MIL/uL — ABNORMAL LOW (ref 3.87–5.11)
RDW: 15.6 % — ABNORMAL HIGH (ref 11.5–15.5)
WBC: 10 10*3/uL (ref 4.0–10.5)
nRBC: 0.5 % — ABNORMAL HIGH (ref 0.0–0.2)

## 2022-10-03 LAB — COMPREHENSIVE METABOLIC PANEL
ALT: 19 U/L (ref 0–44)
AST: 24 U/L (ref 15–41)
Albumin: 2.8 g/dL — ABNORMAL LOW (ref 3.5–5.0)
Alkaline Phosphatase: 44 U/L (ref 38–126)
Anion gap: 10 (ref 5–15)
BUN: 14 mg/dL (ref 8–23)
CO2: 28 mmol/L (ref 22–32)
Calcium: 8.6 mg/dL — ABNORMAL LOW (ref 8.9–10.3)
Chloride: 97 mmol/L — ABNORMAL LOW (ref 98–111)
Creatinine, Ser: 0.68 mg/dL (ref 0.44–1.00)
GFR, Estimated: >60 mL/min (ref >60)
Glucose, Bld: 93 mg/dL (ref 70–99)
Potassium: 3.5 mmol/L (ref 3.5–5.1)
Sodium: 135 mmol/L (ref 135–145)
Total Bilirubin: 1.8 mg/dL — ABNORMAL HIGH (ref 0.3–1.2)
Total Protein: 4.7 g/dL — ABNORMAL LOW (ref 6.5–8.1)

## 2022-10-03 MED ORDER — POTASSIUM CHLORIDE CRYS ER 10 MEQ PO TBCR
10.0000 meq | EXTENDED_RELEASE_TABLET | Freq: Every day | ORAL | Status: DC
  Administered 2022-10-03 – 2022-10-15 (×13): 10 meq via ORAL
  Filled 2022-10-03 (×15): qty 1

## 2022-10-03 NOTE — Progress Notes (Signed)
PROGRESS NOTE   Subjective/Complaints:  Pt reports nausea somewhat better, but wants to continue on clear liquid diet for next 24 hours and see if feeling better tomorrow.  Thinks tongue/taste of things better somewhat with magic mouthwash.  Wants to sit up in chair for all meals- hard to eat in bed.  They just changed L hip dressing- still a little drainage.   Slept better/pain better- got comfortable finally.  ROS:  Pt denies SOB, abd pain, CP,  (+)N/V/C/D, and vision changes    Objective:   DG Abd 1 View  Result Date: 10/02/2022 CLINICAL DATA:  Ileus EXAM: ABDOMEN - 1 VIEW COMPARISON:  Portable exam 0510 hours compared to 09/30/2022 FINDINGS: Scattered gas in nondistended colon. Normal small bowel gas pattern. No bowel dilatation or bowel wall thickening. Questionable calcific density projects over the RIGHT renal silhouette, cannot exclude 11 x 6 mm RIGHT renal calculus though this is not definitely seen on the prior study. No acute osseous findings. IMPRESSION: Nonobstructive bowel gas pattern without bowel dilatation. Questionable 11 x 6 mm RIGHT renal calculus versus artifact. Electronically Signed   By: Lavonia Dana M.D.   On: 10/02/2022 08:17   DG Chest 2 View  Result Date: 10/01/2022 CLINICAL DATA:  Cough EXAM: CHEST - 2 VIEW COMPARISON:  CT 09/24/2022 FINDINGS: Stable enlarged cardiac silhouette. There is LEFT lower lobe opacity similar comparison exam. RIGHT lung nodule measuring 2.4 cm unchanged from recent CT. No pneumothorax. Degenerative osteophytosis of the spine. IMPRESSION: 1. Stable LEFT basilar atelectasis and effusion. 2. Stable large RIGHT pulmonary nodule. Recommend further evaluation as recommended on comparison CT. 3. No interval change. Electronically Signed   By: Suzy Bouchard M.D.   On: 10/01/2022 15:17   Recent Labs    10/02/22 0614 10/03/22 0526  WBC 12.6* 10.0  HGB 8.0* 7.7*  HCT 22.8* 23.0*   PLT 310 320   Recent Labs    10/02/22 0614 10/03/22 0526  NA 135 135  K 3.1* 3.5  CL 96* 97*  CO2 29 28  GLUCOSE 96 93  BUN 13 14  CREATININE 0.74 0.68  CALCIUM 8.8* 8.6*    Intake/Output Summary (Last 24 hours) at 10/03/2022 0900 Last data filed at 10/03/2022 0700 Gross per 24 hour  Intake 560 ml  Output --  Net 560 ml        Physical Exam: Vital Signs Blood pressure (!) 146/39, pulse (!) 50, temperature 97.8 F (36.6 C), temperature source Oral, resp. rate 17, height '5\' 7"'$  (1.702 m), weight 72.7 kg, SpO2 91 %.       General: awake, alert, appropriate, sitting up in bed; trying to eat broth; frustrated with positioning; NAD HENT: conjugate gaze; oropharynx moist CV: bradycardic rate; no JVD Pulmonary: CTA B/L; no W/R/R- good air movement GI: soft, NT, ND, (+)BS- more normoactive Psychiatric: appropriate Neurological: Ox3  Musculoskeletal:     Cervical back: Normal range of motion.     Comments: Left hip tender, still swollen - bruised- mainly yellow-  Skin:    General: Skin is warm.     Findings: Bruising (arms) present.     Comments: Left hip incision CDI with dry dressing- no  drainage through dressing- eating so could not unwrap. . Left ear as above. L ear we've removed bolster- still really bruised  Neurological:     Mental Status: She is alert and oriented to person, place, and time.     Cranial Nerves: No cranial nerve deficit.     Sensory: No sensory deficit.     Comments: Motor: 4-5/5 UE's. RLE 3/5 prox to 5/5 distal. LLE 1-2/5 (pain) to 4/5 distally. DTR's 1+, no abnl tone or ataxia   Assessment/Plan: 1. Functional deficits which require 3+ hours per day of interdisciplinary therapy in a comprehensive inpatient rehab setting. Physiatrist is providing close team supervision and 24 hour management of active medical problems listed below. Physiatrist and rehab team continue to assess barriers to discharge/monitor patient progress toward functional  and medical goals  Care Tool:  Bathing    Body parts bathed by patient: Face   Body parts bathed by helper: Buttocks, Left lower leg, Right lower leg, Face, Front perineal area     Bathing assist Assist Level: Moderate Assistance - Patient 50 - 74%     Upper Body Dressing/Undressing Upper body dressing   What is the patient wearing?: Pull over shirt    Upper body assist Assist Level: Contact Guard/Touching assist    Lower Body Dressing/Undressing Lower body dressing      What is the patient wearing?: Incontinence brief, Pants     Lower body assist Assist for lower body dressing: Maximal Assistance - Patient 25 - 49%     Toileting Toileting    Toileting assist Assist for toileting: Maximal Assistance - Patient 25 - 49%     Transfers Chair/bed transfer  Transfers assist  Chair/bed transfer activity did not occur: Safety/medical concerns (dizziness, pain, decreased balance)  Chair/bed transfer assist level: Moderate Assistance - Patient 50 - 74%     Locomotion Ambulation   Ambulation assist   Ambulation activity did not occur: Safety/medical concerns (dizziness, pain, decreased balance)  Assist level: Minimal Assistance - Patient > 75% Assistive device: Walker-rolling Max distance: 4   Walk 10 feet activity   Assist  Walk 10 feet activity did not occur: Safety/medical concerns (dizziness, pain, decreased balance)        Walk 50 feet activity   Assist Walk 50 feet with 2 turns activity did not occur: Safety/medical concerns (dizziness, pain, decreased balance)         Walk 150 feet activity   Assist Walk 150 feet activity did not occur: Safety/medical concerns (dizziness, pain, decreased balance)         Walk 10 feet on uneven surface  activity   Assist Walk 10 feet on uneven surfaces activity did not occur: Safety/medical concerns (dizziness, pain, decreased balance)         Wheelchair     Assist Is the patient using a  wheelchair?: Yes Type of Wheelchair: Manual Wheelchair activity did not occur: Safety/medical concerns (dizziness, pain, decreased balance)         Wheelchair 50 feet with 2 turns activity    Assist    Wheelchair 50 feet with 2 turns activity did not occur: Safety/medical concerns (dizziness, pain, decreased balance)       Wheelchair 150 feet activity     Assist  Wheelchair 150 feet activity did not occur: Safety/medical concerns (dizziness, pain, decreased balance)       Blood pressure (!) 146/39, pulse (!) 50, temperature 97.8 F (36.6 C), temperature source Oral, resp. rate 17, height '5\' 7"'$  (1.702  m), weight 72.7 kg, SpO2 91 %.  Medical Problem List and Plan: 1. Functional deficits secondary to left intertrochanteric femur fx after fall. Pt s/p IMN 09/25/22             -LLE WBAT             -patient may shower             -ELOS/Goals: 15-20 days  D/c set for 11/21  Con't CIR- PT and OT- nausea improving, but still limiting 2.  Antithrombotics: -DVT/anticoagulation:  xarelto '10mg'$  daily for 14 days             -antiplatelet therapy:  resume plavix '75mg'$  daily 3. Pain Management: tylenol and hydrocodone prn for pain             -robaxin prn for muscle spasms  11/8- pain limiting therapy- doesn't want opiates- mainly using tylenol-   11/9- d/w pt- she's willing to try tramadol 50 mg q6 hours prn- she's scared of Coding if takes Norco- I explained that Tramadol is mild and shouldn't cause any cognitive issues.   11/10- pain much better- not resolved, but better with tramadol- con't tramadol and tylenol prn 4. Mood/Behavior/Sleep: team to provide ego support             -antipsychotic agents: n/a 5. Neuropsych/cognition: This patient is capable of making decisions on her own behalf. 6. Skin/Wound Care: dry dressings to left ear and hip  11/9- having some drainage- from L hip- is improved this Am- will monitor closely.  7. Fluids/Electrolytes/Nutrition: encourage  appropriate PO             -pt's intake has inconsistent so far 8. Nausea:              -might be positional or med related.              -also having associated gas             -pt told me she's been moving bowels, however I don't see a bm recorded             -will try sorbitol today, dulcolax suppository prn             -protonix 11/8- has ileus- per #15 below  11/9- still having nausea- will change diet to clear liquid per pt request- and monitor  11/10- on clear liquid diet- pt wants to wait 1 more day before changing to regular diet- if not better tomorrow, will need to call GI 9. Left lower lobe consolidation             -pneumonia ruled out. Likely atelectasis             -IS, OOB             -wean oxygen to off  11/7- will write order to wean O2.  10. Sinus bradycardia:             -on propranolol and verapamil at home             -continue to hold as HR in 50's 11. Essential HTN             -continue hctz             -keep an eye on BUN/Cr/electrolytes 12. Right middle lobe nodule 2.3 cm- should be considered primary lung CA until ruled out- also has nodule as well 68m on R middle lung- could be mets  per CT scan.              -f/u as outpt  11/7- d/w daughter- she said no f/u at this time- due to pt's age.   11/8- daughter doesn't want to let pt know about tumors.   13. Mild leukocytosis- 11.2   11/6- afebrile- other than difficulty swallowing- says it's dry?; denies UTI Sx's- if gets worse clinically, will recheck in AM- right now scheduled for Wed AM.   11/8- WBC down to 9.4-  14. Constipation  11/6- LBM on day of fall per pt and chart- will give Sorbitol after therapy and follow with SSE if needed.   11/7- had multiple BM's- didn't need SSE- per notes- feels cleaned out  15. ABLA  11/6- Down to 7.6- will recheck Wednesday to make sure not dropping further- and transfuse if required.   11/8- Hb 7.3- on edge of needing transfusion.   11/9- Hb up to 8.0-   11/10- Hb 7.7-  will con't to monitor closely.  24. Post op ileus  11/8- changed to  liquid diet- gave sorbitol after therapy- and will do another KUB tomorrow AM- pt still nauseated- so will con't Zofran.  11/9- feels nauseated- ileus has resolved-   17. Thrush?  11/9- will add Magic mouthwash- don't see plaques, but tongue dry and furrowed. Says things taste 'wierd".   11/10- said magic mouthwash helping Sx's.  18. Leukocytosis  11/9- will check U/A - if (+), will check urine Cx.   11/10- was negative= had ketones, so asked nursing to push fluids.  19. Hypokalemia  11/10- repleted up to 3.5- will put on Kcl 10 mEq daily for now and recheck Monday- since K+ still 3.5- will also check Mg Monday as well    I spent a total of 39   minutes on total care today- >50% coordination of care- due to nausea, d/w nursing about getting up to chair and diet- will also d/w physician on weekend about medical issues.   LOS: 5 days A FACE TO FACE EVALUATION WAS PERFORMED  Gwyn Mehring 10/03/2022, 9:00 AM

## 2022-10-03 NOTE — Plan of Care (Signed)
  Problem: Consults Goal: RH GENERAL PATIENT EDUCATION Description: See Patient Education module for education specifics. Outcome: Progressing   Problem: RH BOWEL ELIMINATION Goal: RH STG MANAGE BOWEL WITH ASSISTANCE Description: STG Manage Bowel with mod i Assistance. Outcome: Progressing Goal: RH STG MANAGE BOWEL W/MEDICATION W/ASSISTANCE Description: STG Manage Bowel with Medication with mod I  Assistance. Outcome: Progressing   Problem: RH BLADDER ELIMINATION Goal: RH STG MANAGE BLADDER WITH ASSISTANCE Description: STG Manage Bladder With toileting Assistance Outcome: Progressing   Problem: RH SAFETY Goal: RH STG ADHERE TO SAFETY PRECAUTIONS W/ASSISTANCE/DEVICE Description: STG Adhere to Safety Precautions With Assistance/Device. Outcome: Progressing   Problem: RH PAIN MANAGEMENT Goal: RH STG PAIN MANAGED AT OR BELOW PT'S PAIN GOAL Description: < 4 with prns Outcome: Progressing   Problem: RH KNOWLEDGE DEFICIT GENERAL Goal: RH STG INCREASE KNOWLEDGE OF SELF CARE AFTER HOSPITALIZATION Description: Patient and family will be able to manage care at discharge using educational handouts independently Outcome: Progressing

## 2022-10-03 NOTE — Progress Notes (Signed)
Occupational Therapy Session Note  Patient Details  Name: Anna Weiss MRN: [id number] Date of Birth: 09-Apr-1925  Today's Date: 10/03/2022 OT Individual Time: 1100-1220 OT Individual Time Calculation (min): 80 min    Short Term Goals: Week 1:  OT Short Term Goal 1 (Week 1): Pt will be Mod A for toileting tasks OT Short Term Goal 2 (Week 1): Pt will be Min A for functional transfers OT Short Term Goal 3 (Week 1): Pt will be Mod A for LB dressing/bathing OT Short Term Goal 4 (Week 1): Pt will require no more than 3 rest breaks  Skilled Therapeutic Interventions/Progress Updates:   Pt seen for full ADL incl shower re-training session. Pt with no pain at rest but 8/10 with sit to stands. Pain meds given earlier. OT covered L hip incision completely with waterproof dressings prior to shower. OT maanged w/c in and out of shower space. Transfer 1st w/c to and from 3 in 1 commode over toilet with mod A. Pt required mod-max a for hygiene and total A for incontinence brief mngt. Pt on and off padded TTB inside stall shower with SPT using grab bars with mod-max a. UB bathing and hair washing with mod A and LB bathing with max A. Sink side UE dressing with set up for pull over shirt, total-max A for brief and pants mngt and D for socks mngt. Pt sat and with set up w/c level completed grooming. Left pt up in w/c at end of prolonged session, encouraged fluids and pain meds as needed. Set w/c exit alarm on, needs and nurse call button in reach. Strong focus next sessions on LB dressing with AE as no time this session d/t extended time for all transfers.    Therapy Documentation Precautions:  Precautions Precautions: Fall, Other (comment) Precaution Comments: monitor O2, orthostatics Restrictions Weight Bearing Restrictions: Yes LLE Weight Bearing: Weight bearing as tolerated   Therapy/Group: Individual Therapy  [name] 10/03/2022, 7:42 AM

## 2022-10-03 NOTE — Progress Notes (Signed)
Physical Therapy Session Note  Patient Details  Name: Anna Weiss MRN: [id number] Date of Birth: 05-27-1925  Today's Date: 10/03/2022 PT Individual Time: 0800-0915 PT Individual Time Calculation (min): 75 min   Short Term Goals: Week 1:  PT Short Term Goal 1 (Week 1): pt will transfer supine<>sitting EOB with min A consistantly PT Short Term Goal 2 (Week 1): pt will transfer sit<>stand with LRAD and mod A PT Short Term Goal 3 (Week 1): pt will transfer bed<>WC with LRAD and mod A  Skilled Therapeutic Interventions/Progress Updates:   First session:  Pt presents semi-reclined in bed and agreeable to therapy.  Pt just finishing receiving meds.  Pt initiates transfer sup to sit , but requires mod A for LLE and then to complete to sitting even w/ HOB elevated and siderails.  Pt tearful, c/o increased pain.  Pt required threading of pants over feet and then pulls to thighs.  Pt transfers sit to stand w/ mod to min A and verbal cues especially for RLE placement.  Pt able to pull pants over hips w/ mod A after bringing to thigh level.  Pt performed step-pivot transfer w/ RW to w/c and min A, decreased foot clearance.  Pt wheeled to sink and performed sit to stand w/ min A to brush teeth and hair, sitting for partial cleaning.  Pt received pain meds after request.  Pt performed LAQ, calf raises 3 x 15 to increase strength and tolerance to movement.  Pt amb x 10' to recliner including turns to sit.  Improved foot clearance w/ step-by-step instructions and use of UE s.  Pt remained sitting in recliner w/ chair alarm and all needs in reach.  Second session:  Pt presents sitting in w/c and agreeable to therapy.  Pt states need for "spitting up" and does spit up some phlegm, but feels better being out of bed.  Pt performed calf raises, LAQ, isometric add, abd, and hip flexion w/ AAROM for abd and hip flexion but increased strength noted.  Pt wheeled to main gym.  Pt transfers sit to stand w/ min A and verbal  cues for initiation especially forward scoot and R LE placement and then forward lean.  Pt amb 26' w/ min A and improved R foot clearance, although verbal cues for posture and use of UE s.  RW adjusted for height and pt amb 30' w/ RW  and min A w/ improved reciprocal gait.  Pt required extended seated rest breaks 2/2 fatigue.  Pt returned to room and remained sitting in w/c w/ chair alarm on and all needs in reach.     Therapy Documentation Precautions:  Precautions Precautions: Fall, Other (comment) Precaution Comments: monitor O2, orthostatics Restrictions Weight Bearing Restrictions: Yes LLE Weight Bearing: Weight bearing as tolerated General:   Vital Signs: Therapy Vitals Temp: 97.8 F (36.6 C) Temp Source: Oral Pulse Rate: (!) 50 Resp: 17 BP: (!) 146/39 Patient Position (if appropriate): Lying Oxygen Therapy SpO2: 91 % O2 Device: Room Air Pain:7/10 w/o activity, 10/10 w/ Pain Assessment Pain Scale: 0-10 Pain Score: 7  Pain Type: Surgical pain Pain Location: Hip Pain Orientation: Left Pain Descriptors / Indicators: Sharp Pain Onset: On-going Patients Stated Pain Goal: 1 Pain Intervention(s): Repositioned Multiple Pain Sites: No     Therapy/Group: Individual Therapy  [name] 10/03/2022, 9:18 AM

## 2022-10-04 DIAGNOSIS — S72142S Displaced intertrochanteric fracture of left femur, sequela: Secondary | ICD-10-CM | POA: Diagnosis not present

## 2022-10-04 NOTE — Plan of Care (Signed)
  Problem: Consults Goal: RH GENERAL PATIENT EDUCATION Description: See Patient Education module for education specifics. Outcome: Progressing   Problem: RH BOWEL ELIMINATION Goal: RH STG MANAGE BOWEL WITH ASSISTANCE Description: STG Manage Bowel with mod i Assistance. Outcome: Progressing Goal: RH STG MANAGE BOWEL W/MEDICATION W/ASSISTANCE Description: STG Manage Bowel with Medication with mod I  Assistance. Outcome: Progressing   Problem: RH BLADDER ELIMINATION Goal: RH STG MANAGE BLADDER WITH ASSISTANCE Description: STG Manage Bladder With toileting Assistance Outcome: Progressing   Problem: RH SAFETY Goal: RH STG ADHERE TO SAFETY PRECAUTIONS W/ASSISTANCE/DEVICE Description: STG Adhere to Safety Precautions With Assistance/Device. Outcome: Progressing   Problem: RH PAIN MANAGEMENT Goal: RH STG PAIN MANAGED AT OR BELOW PT'S PAIN GOAL Description: < 4 with prns Outcome: Progressing   Problem: RH KNOWLEDGE DEFICIT GENERAL Goal: RH STG INCREASE KNOWLEDGE OF SELF CARE AFTER HOSPITALIZATION Description: Patient and family will be able to manage care at discharge using educational handouts independently Outcome: Progressing

## 2022-10-04 NOTE — Progress Notes (Signed)
PROGRESS NOTE   Subjective/Complaints: No pain issues, feels well this am  ROS:  Pt denies SOB, abd pain, CP,  (+)N/V/C/D, and vision changes    Objective:   No results found. Recent Labs    10/02/22 0614 10/03/22 0526  WBC 12.6* 10.0  HGB 8.0* 7.7*  HCT 22.8* 23.0*  PLT 310 320    Recent Labs    10/02/22 0614 10/03/22 0526  NA 135 135  K 3.1* 3.5  CL 96* 97*  CO2 29 28  GLUCOSE 96 93  BUN 13 14  CREATININE 0.74 0.68  CALCIUM 8.8* 8.6*     Intake/Output Summary (Last 24 hours) at 10/04/2022 1023 Last data filed at 10/03/2022 1423 Gross per 24 hour  Intake 236 ml  Output --  Net 236 ml         Physical Exam: Vital Signs Blood pressure (!) 159/43, pulse (!) 59, temperature 97.9 F (36.6 C), temperature source Oral, resp. rate 15, height '5\' 7"'$  (1.702 m), weight 72.7 kg, SpO2 97 %.       General: awake, alert, appropriate, sitting up in bed; trying to eat broth; frustrated with positioning; NAD HENT: conjugate gaze; oropharynx moist CV: bradycardic rate; no JVD Pulmonary: CTA B/L; no W/R/R- good air movement GI: soft, NT, ND, (+)BS- more normoactive Psychiatric: appropriate Neurological: Ox3  Musculoskeletal:     Cervical back: Normal range of motion.     Comments: Left hip tender, still swollen - bruised- mainly yellow-  Skin:    General: Skin is warm.     Findings: Bruising (arms) present.     Comments: Left hip incision CDI with dry dressing- no drainage through dressing- eating so could not unwrap. . Left ear as above. L ear we've removed bolster- still really bruised  Neurological:     Mental Status: She is alert and oriented to person, place, and time.     Cranial Nerves: No cranial nerve deficit.     Sensory: No sensory deficit.     Comments: Motor: 4-5/5 UE's. RLE 3/5 prox to 5/5 distal. LLE 1-2/5 (pain) to 4/5 distally. DTR's 1+, no abnl tone or ataxia   Assessment/Plan: 1.  Functional deficits which require 3+ hours per day of interdisciplinary therapy in a comprehensive inpatient rehab setting. Physiatrist is providing close team supervision and 24 hour management of active medical problems listed below. Physiatrist and rehab team continue to assess barriers to discharge/monitor patient progress toward functional and medical goals  Care Tool:  Bathing    Body parts bathed by patient: Right arm, Left arm, Chest, Abdomen, Front perineal area, Right upper leg, Face   Body parts bathed by helper: Left upper leg, Right lower leg, Left lower leg, Buttocks     Bathing assist Assist Level: Moderate Assistance - Patient 50 - 74%     Upper Body Dressing/Undressing Upper body dressing   What is the patient wearing?: Pull over shirt    Upper body assist Assist Level: Supervision/Verbal cueing    Lower Body Dressing/Undressing Lower body dressing      What is the patient wearing?: Incontinence brief, Pants     Lower body assist Assist for lower body  dressing: Maximal Assistance - Patient 25 - 49%     Toileting Toileting    Toileting assist Assist for toileting: Moderate Assistance - Patient 50 - 74%     Transfers Chair/bed transfer  Transfers assist  Chair/bed transfer activity did not occur: Safety/medical concerns (dizziness, pain, decreased balance)  Chair/bed transfer assist level: Moderate Assistance - Patient 50 - 74%     Locomotion Ambulation   Ambulation assist   Ambulation activity did not occur: Safety/medical concerns (dizziness, pain, decreased balance)  Assist level: Minimal Assistance - Patient > 75% Assistive device: Walker-rolling Max distance: 30   Walk 10 feet activity   Assist  Walk 10 feet activity did not occur: Safety/medical concerns (dizziness, pain, decreased balance)  Assist level: Minimal Assistance - Patient > 75% Assistive device: Walker-rolling   Walk 50 feet activity   Assist Walk 50 feet with 2  turns activity did not occur: Safety/medical concerns         Walk 150 feet activity   Assist Walk 150 feet activity did not occur: Safety/medical concerns         Walk 10 feet on uneven surface  activity   Assist Walk 10 feet on uneven surfaces activity did not occur: Safety/medical concerns (dizziness, pain, decreased balance)         Wheelchair     Assist Is the patient using a wheelchair?: Yes Type of Wheelchair: Manual Wheelchair activity did not occur: Safety/medical concerns (dizziness, pain, decreased balance)         Wheelchair 50 feet with 2 turns activity    Assist    Wheelchair 50 feet with 2 turns activity did not occur: Safety/medical concerns (dizziness, pain, decreased balance)       Wheelchair 150 feet activity     Assist  Wheelchair 150 feet activity did not occur: Safety/medical concerns (dizziness, pain, decreased balance)       Blood pressure (!) 159/43, pulse (!) 59, temperature 97.9 F (36.6 C), temperature source Oral, resp. rate 15, height '5\' 7"'$  (1.702 m), weight 72.7 kg, SpO2 97 %.  Medical Problem List and Plan: 1. Functional deficits secondary to left intertrochanteric femur fx after fall. Pt s/p IMN 09/25/22             -LLE WBAT             -patient may shower             -ELOS/Goals: 15-20 days  D/c set for 11/21  Con't CIR- PT and OT- nausea improving, but still limiting 2.  Antithrombotics: -DVT/anticoagulation:  xarelto '10mg'$  daily for 14 days             -antiplatelet therapy:  resume plavix '75mg'$  daily 3. Pain Management: tylenol and hydrocodone prn for pain             -robaxin prn for muscle spasms  11/8- pain limiting therapy- doesn't want opiates- mainly using tylenol-   11/9- d/w pt- she's willing to try tramadol 50 mg q6 hours prn- she's scared of Coding if takes Norco- I explained that Tramadol is mild and shouldn't cause any cognitive issues.   11/10- pain much better- not resolved, but better with  tramadol- con't tramadol and tylenol prn 4. Mood/Behavior/Sleep: team to provide ego support             -antipsychotic agents: n/a 5. Neuropsych/cognition: This patient is capable of making decisions on her own behalf. 6. Skin/Wound Care: dry dressings to left ear and  hip  11/9- having some drainage- from L hip- is improved this Am- will monitor closely.  7. Fluids/Electrolytes/Nutrition: encourage appropriate PO             -pt's intake has inconsistent so far 8. Nausea:              -might be positional or med related.              -also having associated gas             -pt told me she's been moving bowels, however I don't see a bm recorded             -will try sorbitol today, dulcolax suppository prn             -protonix 11/8- has ileus- per #15 below  11/9- still having nausea- will change diet to clear liquid per pt request- and monitor  11/10- on clear liquid diet- pt wants to wait 1 more day before changing to regular diet- if not better tomorrow, will need to call GI 9. Left lower lobe consolidation             -pneumonia ruled out. Likely atelectasis             -IS, OOB             -wean oxygen to off  11/7- will write order to wean O2.  10. Sinus bradycardia:             -on propranolol and verapamil at home             -continue to hold as HR in 50's 11. Essential HTN             -continue hctz            Vitals:   10/03/22 1837 10/04/22 0453  BP: (!) 142/55 (!) 159/43  Pulse: (!) 46 (!) 59  Resp: 16 15  Temp: 97.7 F (36.5 C) 97.9 F (36.6 C)  SpO2: 100% 97%    12. Right middle lobe nodule 2.3 cm- should be considered primary lung CA until ruled out- also has nodule as well 26m on R middle lung- could be mets per CT scan.              -f/u as outpt  11/7- d/w daughter- she said no f/u at this time- due to pt's age.   11/8- daughter doesn't want to let pt know about tumors.   13. Mild leukocytosis- 11.2   11/6- afebrile- other than difficulty swallowing-  says it's dry?; denies UTI Sx's- if gets worse clinically, will recheck in AM- right now scheduled for Wed AM.   11/8- WBC down to 9.4-  14. Constipation  11/6- LBM on day of fall per pt and chart- will give Sorbitol after therapy and follow with SSE if needed.   11/7- had multiple BM's- didn't need SSE- per notes- feels cleaned out  15. ABLA  11/6- Down to 7.6- will recheck Wednesday to make sure not dropping further- and transfuse if required.   11/8- Hb 7.3- on edge of needing transfusion.   11/9- Hb up to 8.0-   11/10- Hb 7.7- will con't to monitor closely.  16. Post op ileus resolved BM x 3 yesterday  17. Thrush?  11/9- will add Magic mouthwash- don't see plaques, but tongue dry and furrowed. Says things taste 'wierd".   11/10- said magic mouthwash helping Sx's.  18. Leukocytosis  improved       Latest Ref Rng & Units 10/03/2022    5:26 AM 10/02/2022    6:14 AM 10/01/2022    6:17 AM  CBC  WBC 4.0 - 10.5 K/uL 10.0  12.6  9.4   Hemoglobin 12.0 - 15.0 g/dL 7.7  8.0  7.3   Hematocrit 36.0 - 46.0 % 23.0  22.8  21.0   Platelets 150 - 400 K/uL 320  310  265     19. Hypokalemia  11/10- repleted up to 3.5- will put on Kcl 10 mEq daily for now and recheck Monday- since K+ still 3.5- will also check Mg Monday as well      LOS: 6 days A FACE TO FACE EVALUATION WAS PERFORMED  Charlett Blake 10/04/2022, 10:23 AM

## 2022-10-04 NOTE — Progress Notes (Signed)
Occupational Therapy Session Note  Patient Details  Name: Anna Weiss MRN: [id number] Date of Birth: 1925/09/28  Today's Date: 10/04/2022 OT Individual Time: 9509-3267 & 1530-1630 OT Individual Time Calculation (min): 45 min & 60 min   Short Term Goals: Week 1:  OT Short Term Goal 1 (Week 1): Pt will be Mod A for toileting tasks OT Short Term Goal 2 (Week 1): Pt will be Min A for functional transfers OT Short Term Goal 3 (Week 1): Pt will be Mod A for LB dressing/bathing OT Short Term Goal 4 (Week 1): Pt will require no more than 3 rest breaks  Skilled Therapeutic Interventions/Progress Updates:     Session 1: Upon OT arrival, pt semi recumbent in bed with daughter present at bedside. Pt reports no pain when not moving but pain increases with movement. Pt agreeable to OT treatment. Treatment intervention with a focus on self care retraining, use of AE, and functional transfers. Pt was provided a reacher to donn pants with at the level below. Pt requires verbal and tactile cues to efficiently use. Increased time required. Pt then completes supine to sit transfer with Mod A and performs sit to stand with 3 attempts and Mod A to manage pants. Pt returns to seated and pt's BP was taken with a high diastolic number of 124 but was asymptomatic and able to continue with therapy. Pt requesting to perform toileting and completes stand step transfer with Mod A fading to Min A. Pt performs toileting with Min A having a BM and completes stand step transfer with Min A. Pt was left in w/c at end of session with all needs met and safety measures in place.   Session 2: Upon OT arrival, pt seated in w/c and BP taken reading 142/58. Pt agreeable to OT treatment and receiving pain medication at beginning of session. Treatment intervention with a focus on strengthening to facilitate transfer training, endurance, and transfer training. Pt was transported to main therapy gym via w/c and total A. While seated at  tabletop, 1.5lb wrist weights donned to B UE and pt retrieves laundry from laundry basket and folds all items neatly. Pt performs at a slow and steady pace and requires short rest breaks. Pt then returns all items one at a time back into basket with min difficulty and demonstrates fatigue at end of task. Pt performs 5 sit<>stand transfers with verbal and tactile cues provided ranging between Min A-CGA and demonstrates improvement and less assist for 4th and 5th trial. Pt noted to be incontinent of urine during transfer and was returned to her room via w/c and total A. Pt completes toileting with Min A for brief management and is able to stand for ~3 minutes to manage pants and brief. Pt completes stand step transfer to bed with RW and CGA and performs sit to supine transfer with Mod A. Pt requires Min A to scoot up in bed for L LE knee flexion. Pt was left in bed at end of session with all needs met and safety measures in place.   Therapy Documentation Precautions:  Precautions Precautions: Fall, Other (comment) Precaution Comments: monitor O2, orthostatics Restrictions Weight Bearing Restrictions: Yes LLE Weight Bearing: Weight bearing as tolerated General:   Vital Signs: Therapy Vitals Temp: 97.9 F (36.6 C) Pulse Rate: 74 Resp: 17 BP: (!) 144/121 Patient Position (if appropriate): Sitting Oxygen Therapy SpO2: 100 % O2 Device: Room Air  ADL: Lower Body Dressing: Moderate assistance (pants only with reacher, greater assist for  transfer) Where Assessed-Lower Body Dressing: Edge of bed, Bed level Toileting: Minimal assistance Where Assessed-Toileting: Bedside Commode Toilet Transfer: Moderate assistance Toilet Transfer Method: Other (comment) (stand step) Toilet Transfer Equipment: Bedside commode   Therapy/Group: Individual Therapy  Marvetta Gibbons 10/04/2022, 4:02 PM

## 2022-10-05 DIAGNOSIS — S72142S Displaced intertrochanteric fracture of left femur, sequela: Secondary | ICD-10-CM | POA: Diagnosis not present

## 2022-10-05 NOTE — Progress Notes (Signed)
Occupational Therapy Session Note  Patient Details  Name: Anna Weiss MRN: [id number] Date of Birth: [birth year]  Today's Date: 10/05/2022 OT Individual Time: 2637-8588 OT Individual Time Calculation (min): 65 min    Short Term Goals: Week 1:  OT Short Term Goal 1 (Week 1): Pt will be Mod A for toileting tasks OT Short Term Goal 2 (Week 1): Pt will be Min A for functional transfers OT Short Term Goal 3 (Week 1): Pt will be Mod A for LB dressing/bathing OT Short Term Goal 4 (Week 1): Pt will require no more than 3 rest breaks  Skilled Therapeutic Interventions/Progress Updates:    Upon OT arrival, pt semi recumbent in bed reporting no pain and is agreeable to OT treatment. Treatment intervention with a focus on self care retraining with use of AE, functional transfers, functional mobility, and endurance. Pt completes supine to sit transfer with Min A and sits EOB to donn lotion with setup, doff/donn shirt with Setup, donn pants with Min A for balance with RW using reacher, and donns socks with sock aid and SBA. Increased time required. Pt requires verbal cues for safe transfer technique and to align herself with chair before descending. Pt requires encouragement and education on progress she has made thus far. Pt was transported to ortho gym via w/c and total A for time management and completes arm bike for 10 minutes at a slow and steady pace. Pt was returned to her room via w/c and total A and left in w/c at end of session with all needs met and safety measures in place.   Therapy Documentation Precautions:  Precautions Precautions: Fall, Other (comment) Precaution Comments: monitor O2, orthostatics Restrictions Weight Bearing Restrictions: Yes LLE Weight Bearing: Weight bearing as tolerated   Therapy/Group: Individual Therapy  Marvetta Gibbons 10/05/2022, 10:56 AM

## 2022-10-05 NOTE — Progress Notes (Signed)
PROGRESS NOTE   Subjective/Complaints: Pt had liquid BM this am , passing gas , no nausea  ROS:  Pt denies SOB, abd pain, CP,  (-)N/V/C/D, and vision changes    Objective:   No results found. Recent Labs    10/03/22 0526  WBC 10.0  HGB 7.7*  HCT 23.0*  PLT 320    Recent Labs    10/03/22 0526  NA 135  K 3.5  CL 97*  CO2 28  GLUCOSE 93  BUN 14  CREATININE 0.68  CALCIUM 8.6*     Intake/Output Summary (Last 24 hours) at 10/05/2022 0730 Last data filed at 10/04/2022 1300 Gross per 24 hour  Intake 600 ml  Output --  Net 600 ml         Physical Exam: Vital Signs Blood pressure (!) 145/44, pulse (!) 51, temperature 98.1 F (36.7 C), temperature source Oral, resp. rate 18, height '5\' 7"'$  (1.702 m), weight 72.7 kg, SpO2 97 %.  General: No acute distress Mood and affect are appropriate Heart: Regular rate and rhythm no rubs murmurs or extra sounds Lungs: Clear to auscultation, breathing unlabored, no rales or wheezes Abdomen: Positive bowel sounds, soft nontender to palpation, mild distension, + tympany Extremities: No clubbing, cyanosis, or edema Skin:post op dressing L hip   Musculoskeletal:     Cervical back: Normal range of motion.     Comments: Left hip tender, still swollen - bruised- mainly yellow-  Skin:    General: Skin is warm.     Findings: Bruising (arms) present.     Comments: Left hip incision CDI with dry dressing- no drainage through dressing- eating so could not unwrap. . Left ear as above. L ear we've removed bolster- still really bruised  Neurological:     Mental Status: She is alert and oriented to person, place, and time.     Cranial Nerves: No cranial nerve deficit.     Sensory: No sensory deficit.     Comments: Motor: 4-5/5 UE's. RLE 3/5 prox to 5/5 distal. LLE 1-2/5 (pain) to 4/5 distally. DTR's 1+, no abnl tone or ataxia   Assessment/Plan: 1. Functional deficits which  require 3+ hours per day of interdisciplinary therapy in a comprehensive inpatient rehab setting. Physiatrist is providing close team supervision and 24 hour management of active medical problems listed below. Physiatrist and rehab team continue to assess barriers to discharge/monitor patient progress toward functional and medical goals  Care Tool:  Bathing    Body parts bathed by patient: Right arm, Left arm, Chest, Abdomen, Front perineal area, Right upper leg, Face   Body parts bathed by helper: Left upper leg, Right lower leg, Left lower leg, Buttocks     Bathing assist Assist Level: Moderate Assistance - Patient 50 - 74%     Upper Body Dressing/Undressing Upper body dressing   What is the patient wearing?: Pull over shirt    Upper body assist Assist Level: Supervision/Verbal cueing    Lower Body Dressing/Undressing Lower body dressing      What is the patient wearing?: Incontinence brief, Pants     Lower body assist Assist for lower body dressing: Maximal Assistance - Patient 25 -  49%     Toileting Toileting    Toileting assist Assist for toileting: Moderate Assistance - Patient 50 - 74%     Transfers Chair/bed transfer  Transfers assist  Chair/bed transfer activity did not occur: Safety/medical concerns (dizziness, pain, decreased balance)  Chair/bed transfer assist level: Moderate Assistance - Patient 50 - 74%     Locomotion Ambulation   Ambulation assist   Ambulation activity did not occur: Safety/medical concerns (dizziness, pain, decreased balance)  Assist level: Minimal Assistance - Patient > 75% Assistive device: Walker-rolling Max distance: 30   Walk 10 feet activity   Assist  Walk 10 feet activity did not occur: Safety/medical concerns (dizziness, pain, decreased balance)  Assist level: Minimal Assistance - Patient > 75% Assistive device: Walker-rolling   Walk 50 feet activity   Assist Walk 50 feet with 2 turns activity did not  occur: Safety/medical concerns         Walk 150 feet activity   Assist Walk 150 feet activity did not occur: Safety/medical concerns         Walk 10 feet on uneven surface  activity   Assist Walk 10 feet on uneven surfaces activity did not occur: Safety/medical concerns (dizziness, pain, decreased balance)         Wheelchair     Assist Is the patient using a wheelchair?: Yes Type of Wheelchair: Manual Wheelchair activity did not occur: Safety/medical concerns (dizziness, pain, decreased balance)         Wheelchair 50 feet with 2 turns activity    Assist    Wheelchair 50 feet with 2 turns activity did not occur: Safety/medical concerns (dizziness, pain, decreased balance)       Wheelchair 150 feet activity     Assist  Wheelchair 150 feet activity did not occur: Safety/medical concerns (dizziness, pain, decreased balance)       Blood pressure (!) 145/44, pulse (!) 51, temperature 98.1 F (36.7 C), temperature source Oral, resp. rate 18, height '5\' 7"'$  (1.702 m), weight 72.7 kg, SpO2 97 %.  Medical Problem List and Plan: 1. Functional deficits secondary to left intertrochanteric femur fx after fall. Pt s/p IMN 09/25/22             -LLE WBAT             -patient may shower             -ELOS/Goals: 15-20 days  D/c set for 11/21  Con't CIR- PT and OT- nausea improving, but still limiting 2.  Antithrombotics: -DVT/anticoagulation:  xarelto '10mg'$  daily for 14 days             -antiplatelet therapy:  resume plavix '75mg'$  daily 3. Pain Management: tylenol and hydrocodone prn for pain             -robaxin prn for muscle spasms  11/8- pain limiting therapy- doesn't want opiates- mainly using tylenol-   11/9- d/w pt- she's willing to try tramadol 50 mg q6 hours prn- she's scared of Coding if takes Norco- I explained that Tramadol is mild and shouldn't cause any cognitive issues.   11/10- pain much better- not resolved, but better with tramadol- con't tramadol  and tylenol prn 4. Mood/Behavior/Sleep: team to provide ego support             -antipsychotic agents: n/a 5. Neuropsych/cognition: This patient is capable of making decisions on her own behalf. 6. Skin/Wound Care: dry dressings to left ear and hip  11/9- having some drainage- from  L hip- is improved this Am- will monitor closely.  7. Fluids/Electrolytes/Nutrition: encourage appropriate PO             -pt's intake has inconsistent so far 8. Nausea:              -might be positional or med related.              -also having associated gas             -pt told me she's been moving bowels, however I don't see a bm recorded             -will try sorbitol today, dulcolax suppository prn             -protonix 11/8- has ileus- per #15 below  11/9- still having nausea- will change diet to clear liquid per pt request- and monitor  11/10- on clear liquid diet- pt wants to wait 1 more day before changing to regular diet- if not better tomorrow, will need to call GI 9. Left lower lobe consolidation             -pneumonia ruled out. Likely atelectasis             -IS, OOB             -wean oxygen to off  11/7- will write order to wean O2.  10. Sinus bradycardia:             -on propranolol and verapamil at home             -continue to hold as HR in 50's 11. Essential HTN             -continue hctz            Vitals:   10/04/22 2005 10/05/22 0507  BP: (!) 130/39 (!) 145/44  Pulse: (!) 52 (!) 51  Resp: 18 18  Temp: 98 F (36.7 C) 98.1 F (36.7 C)  SpO2: 91% 97%    12. Right middle lobe nodule 2.3 cm- should be considered primary lung CA until ruled out- also has nodule as well 74m on R middle lung- could be mets per CT scan.              -f/u as outpt  11/7- d/w daughter- she said no f/u at this time- due to pt's age.   11/8- daughter doesn't want to let pt know about tumors.   13. Mild leukocytosis- 11.2   11/6- afebrile- other than difficulty swallowing- says it's dry?; denies UTI  Sx's- if gets worse clinically, will recheck in AM- right now scheduled for Wed AM.   11/8- WBC down to 9.4-  14. Constipation  11/6- LBM on day of fall per pt and chart- will give Sorbitol after therapy and follow with SSE if needed.   11/7- had multiple BM's- didn't need SSE- per notes- feels cleaned out  15. ABLA  11/6- Down to 7.6- will recheck Wednesday to make sure not dropping further- and transfuse if required.   11/8- Hb 7.3- on edge of needing transfusion.   11/9- Hb up to 8.0-   11/10- Hb 7.7- will con't to monitor closely.  Recheck in am 11/13 16. Post op ileus resolved BM x 3 11/10, 1 BM 11/12- advance diet as tolerated  17. Thrush?  11/9- will add Magic mouthwash- don't see plaques, but tongue dry and furrowed. Says things taste 'wierd".   11/10- said magic mouthwash helping  Sx's.  18. Leukocytosis improved - recheck in am       Latest Ref Rng & Units 10/03/2022    5:26 AM 10/02/2022    6:14 AM 10/01/2022    6:17 AM  CBC  WBC 4.0 - 10.5 K/uL 10.0  12.6  9.4   Hemoglobin 12.0 - 15.0 g/dL 7.7  8.0  7.3   Hematocrit 36.0 - 46.0 % 23.0  22.8  21.0   Platelets 150 - 400 K/uL 320  310  265     19. Hypokalemia  11/10- repleted up to 3.5- will put on Kcl 10 mEq daily for now and recheck Monday- since K+ still 3.5- will also check Mg Monday as well      LOS: 7 days A FACE TO FACE EVALUATION WAS PERFORMED  Charlett Blake 10/05/2022, 7:30 AM

## 2022-10-05 NOTE — Plan of Care (Signed)
  Problem: Consults Goal: RH GENERAL PATIENT EDUCATION Description: See Patient Education module for education specifics. Outcome: Progressing   Problem: RH BOWEL ELIMINATION Goal: RH STG MANAGE BOWEL WITH ASSISTANCE Description: STG Manage Bowel with mod i Assistance. Outcome: Progressing Goal: RH STG MANAGE BOWEL W/MEDICATION W/ASSISTANCE Description: STG Manage Bowel with Medication with mod I  Assistance. Outcome: Progressing   Problem: RH BLADDER ELIMINATION Goal: RH STG MANAGE BLADDER WITH ASSISTANCE Description: STG Manage Bladder With toileting Assistance Outcome: Progressing   Problem: RH SAFETY Goal: RH STG ADHERE TO SAFETY PRECAUTIONS W/ASSISTANCE/DEVICE Description: STG Adhere to Safety Precautions With Assistance/Device. Outcome: Progressing   Problem: RH PAIN MANAGEMENT Goal: RH STG PAIN MANAGED AT OR BELOW PT'S PAIN GOAL Description: < 4 with prns Outcome: Progressing   Problem: RH KNOWLEDGE DEFICIT GENERAL Goal: RH STG INCREASE KNOWLEDGE OF SELF CARE AFTER HOSPITALIZATION Description: Patient and family will be able to manage care at discharge using educational handouts independently Outcome: Progressing

## 2022-10-05 NOTE — Progress Notes (Signed)
Physical Therapy Session Note  Patient Details  Name: Anna Weiss MRN: [id number] Date of Birth: 05/26/1925  Today's Date: 10/05/2022 PT Individual Time: 1320-1345 PT Individual Time Calculation (min): 25 min  and Today's Date: 10/05/2022 PT Missed Time: 20 Minutes Missed Time Reason: Unavailable (Comment) (eating lunch)  Short Term Goals: Week 1:  PT Short Term Goal 1 (Week 1): pt will transfer supine<>sitting EOB with min A consistantly PT Short Term Goal 2 (Week 1): pt will transfer sit<>stand with LRAD and mod A PT Short Term Goal 3 (Week 1): pt will transfer bed<>WC with LRAD and mod A  Skilled Therapeutic Interventions/Progress Updates:      Therapy Documentation Precautions:  Precautions Precautions: Fall, Other (comment) Precaution Comments: monitor O2, orthostatics Restrictions Weight Bearing Restrictions: Yes LLE Weight Bearing: Weight bearing as tolerated General:  Pt received semi-reclined in bed eating lunch and requested additional time. Pt agreeable to PT on second arrival and missed 20 minutes of skilled physical therapy at start of session. Pt without verbal complaints of pain in session. Pt requires min A for LE management with bed mobility and CGA with sit to stand. Pt requires verbal cues for hand placement. Pt ambulated 20 ft x 2 with seated rest break in between CGA. Pt presents with antalgic gait pattern and decreased stance on left LE. Pt also heavily relies on UE support on walker and with verbal cues able to decrease. Pt left semi-reclined in bed with all needs in reach and nursing present.     Therapy/Group: Individual Therapy  [name] [name] PT, DPT  10/05/2022, 7:54 AM

## 2022-10-06 LAB — BASIC METABOLIC PANEL
Anion gap: 10 (ref 5–15)
BUN: 6 mg/dL — ABNORMAL LOW (ref 8–23)
CO2: 32 mmol/L (ref 22–32)
Calcium: 8.9 mg/dL (ref 8.9–10.3)
Chloride: 95 mmol/L — ABNORMAL LOW (ref 98–111)
Creatinine, Ser: 0.71 mg/dL (ref 0.44–1.00)
GFR, Estimated: >60 mL/min (ref >60)
Glucose, Bld: 106 mg/dL — ABNORMAL HIGH (ref 70–99)
Potassium: 3.1 mmol/L — ABNORMAL LOW (ref 3.5–5.1)
Sodium: 137 mmol/L (ref 135–145)

## 2022-10-06 LAB — CBC WITH DIFFERENTIAL/PLATELET
Abs Immature Granulocytes: 0.07 10*3/uL (ref 0.00–0.07)
Basophils Absolute: 0 10*3/uL (ref 0.0–0.1)
Basophils Relative: 0 %
Eosinophils Absolute: 0.2 10*3/uL (ref 0.0–0.5)
Eosinophils Relative: 2 %
HCT: 24.1 % — ABNORMAL LOW (ref 36.0–46.0)
Hemoglobin: 8.4 g/dL — ABNORMAL LOW (ref 12.0–15.0)
Immature Granulocytes: 1 %
Lymphocytes Relative: 26 %
Lymphs Abs: 2 10*3/uL (ref 0.7–4.0)
MCH: 37.5 pg — ABNORMAL HIGH (ref 26.0–34.0)
MCHC: 34.9 g/dL (ref 30.0–36.0)
MCV: 107.6 fL — ABNORMAL HIGH (ref 80.0–100.0)
Monocytes Absolute: 1.1 10*3/uL — ABNORMAL HIGH (ref 0.1–1.0)
Monocytes Relative: 14 %
Neutro Abs: 4.4 10*3/uL (ref 1.7–7.7)
Neutrophils Relative %: 57 %
Platelets: 346 10*3/uL (ref 150–400)
RBC: 2.24 MIL/uL — ABNORMAL LOW (ref 3.87–5.11)
RDW: 17.5 % — ABNORMAL HIGH (ref 11.5–15.5)
WBC: 7.8 10*3/uL (ref 4.0–10.5)
nRBC: 0 % (ref 0.0–0.2)

## 2022-10-06 LAB — MAGNESIUM: Magnesium: 1.6 mg/dL — ABNORMAL LOW (ref 1.7–2.4)

## 2022-10-06 MED ORDER — POTASSIUM CHLORIDE CRYS ER 20 MEQ PO TBCR
40.0000 meq | EXTENDED_RELEASE_TABLET | ORAL | Status: AC
  Administered 2022-10-06 (×3): 40 meq via ORAL
  Filled 2022-10-06 (×3): qty 2

## 2022-10-06 MED ORDER — MAGNESIUM OXIDE -MG SUPPLEMENT 400 (240 MG) MG PO TABS
800.0000 mg | ORAL_TABLET | Freq: Two times a day (BID) | ORAL | Status: AC
  Administered 2022-10-06 – 2022-10-07 (×3): 800 mg via ORAL
  Filled 2022-10-06 (×3): qty 2

## 2022-10-06 NOTE — Progress Notes (Signed)
Occupational Therapy Session Note  Patient Details  Name: QUINCEY NORED MRN: [id number] Date of Birth: [birth year]  Today's Date: 10/06/2022 OT Individual Time: 6568-1275 OT Individual Time Calculation (min): 70 min    Short Term Goals: Week 1:  OT Short Term Goal 1 (Week 1): Pt will be Mod A for toileting tasks OT Short Term Goal 2 (Week 1): Pt will be Min A for functional transfers OT Short Term Goal 3 (Week 1): Pt will be Mod A for LB dressing/bathing OT Short Term Goal 4 (Week 1): Pt will require no more than 3 rest breaks  Skilled Therapeutic Interventions/Progress Updates:    Pt resting in w/c upon arrival. Pt requested to use toilet before leaving the room. CGA to amb with RW to bathroom. Toileting with min A. Sit<>stand from w/c and BSC with min A. Standing activities in gym at table included placing and removing squigz from checker board and folding wash cloths/towels. Pt required rest breaks x 4 during tasks. CGA for standing balance during tasks. Pt returned to room and remained seated in w/c. Belt alarm activated. All needs within reach and daughter present.   Therapy Documentation Precautions:  Precautions Precautions: Fall, Other (comment) Precaution Comments: monitor O2, orthostatics Restrictions Weight Bearing Restrictions: Yes LLE Weight Bearing: Weight bearing as tolerated  Pain: Pain Assessment Pain Scale: 0-10 Pain Score: 5  Pain Type: Surgical pain Pain Location: Hip Pain Orientation: Left Pain Descriptors / Indicators: Discomfort Pain Frequency: Constant Pain Onset: Progressive Patients Stated Pain Goal: 2 Pain Intervention(s): premedicated    Therapy/Group: Individual Therapy  Leroy Libman 10/06/2022, 12:12 PM

## 2022-10-06 NOTE — Progress Notes (Signed)
Occupational Therapy Session Note  Patient Details  Name: [name] MRN: [id number] Date of Birth: February 23, 1925  Today's Date: 10/06/2022 OT Individual Time: 7124-5809 OT Individual Time Calculation (min): 35 min    Short Term Goals: Week 1:  OT Short Term Goal 1 (Week 1): Pt will be Mod A for toileting tasks OT Short Term Goal 2 (Week 1): Pt will be Min A for functional transfers OT Short Term Goal 3 (Week 1): Pt will be Mod A for LB dressing/bathing OT Short Term Goal 4 (Week 1): Pt will require no more than 3 rest breaks  Skilled Therapeutic Interventions/Progress Updates:    Upon OT arrival, pt semi recumbent in bed and is excited about her diet being changed. Pt agreeable to OT treatment and reports no pain. Treatment intervention with a focus on self care retraining and functional mobility. Pt completes supine to sit transfer with SBA. Shoes and socks were donned with Max A for time management secondary to pt requesting to use the bathroom. Pt reports feeling anxious just walking in non skid socks and does better walking in her tennis shoes. PT notified. Pt completes sit to stand transfer with Min A using RW and ambulates to bathroom to complete toilet transfer with CGA. Pt completes toileting with CGA after having a BM, and ambulates to w/c with CGA. Pt completes stand to sit transfer with CGA. Pt was left in w/c at end of session with PT present. Pt progressing towards stated OT goals and continues to benefit from OT services to achieve highest level of independence.   Therapy Documentation Precautions:  Precautions Precautions: Fall, Other (comment) Precaution Comments: monitor O2, orthostatics Restrictions Weight Bearing Restrictions: Yes LLE Weight Bearing: Weight bearing as tolerated   Therapy/Group: Individual Therapy  Marvetta Gibbons 10/06/2022, 8:14 AM

## 2022-10-06 NOTE — Progress Notes (Signed)
Physical Therapy Weekly Progress Note  Patient Details  Name: Semiah S Morsch MRN: [id number] Date of Birth: 09/17/1925  Beginning of progress report period: September 29, 2022 End of progress report period: October 06, 2022  Today's Date: 10/06/2022 PT Individual Time: 0805-0900 PT Individual Time Calculation (min): 55 min   Patient has met 3 of 3 short term goals.  Patient is making steady progress to long term goals due to improvements in strength, activity tolerance, balance and coordination. Pt requires min A for bed mobility, CGA/min A with transfers and CGA gait x 70 ft with RW and increased time. Plan to continue pt and family education to prepare for discharge.   Patient continues to demonstrate the following deficits muscle weakness, decreased cardiorespiratoy endurance, and decreased standing balance, decreased postural control, and decreased balance strategies and therefore will continue to benefit from skilled PT intervention to increase functional independence with mobility.  Patient progressing toward long term goals..  Continue plan of care.  PT Short Term Goals Week 1:  PT Short Term Goal 1 (Week 1): pt will transfer supine<>sitting EOB with min A consistantly PT Short Term Goal 1 - Progress (Week 1): Met PT Short Term Goal 2 (Week 1): pt will transfer sit<>stand with LRAD and mod A PT Short Term Goal 2 - Progress (Week 1): Met PT Short Term Goal 3 (Week 1): pt will transfer bed<>WC with LRAD and mod A PT Short Term Goal 3 - Progress (Week 1): Met Week 2:  PT Short Term Goal 1 (Week 2): STG=LTG due to ELOS  Skilled Therapeutic Interventions/Progress Updates:      Therapy Documentation Precautions:  Precautions Precautions: Fall, Other (comment) Precaution Comments: monitor O2, orthostatics Restrictions Weight Bearing Restrictions: Yes LLE Weight Bearing: Weight bearing as tolerated   Daily Treatment Session:     Pt received seated in w/c and OT present for  handoff. Pt agreeable to PT session with emphasis on transfer and gait training. Pt reports 8/10 L LE pain, nursing notified and administered pain medication. Pt requires CGA/min A for stand pivot transfers x 3 with RW from w/c<>mat. Pt with increased difficulty with sit to stand from mat table as surface is noncompliant. Pt requires verbal cues for UE/LE placement and sequencing. Pt educated on benefits of utilizing momentum to better sit to stand transfers. Pt transitioned to gait training and ambulated 70 ft with CGA and w/c follow as pt presents with decreased activity tolerance. Pt progressed from step to gait to step through pattern with visual and verbal cueing and demonstrated increased gait speed. Pt transported by w/c to room for energy conservation and pt left seated in w/c at bedside with all needs in reach and alarm on.   Therapy/Group: Individual Therapy    10/06/2022, 7:51 AM  

## 2022-10-06 NOTE — Progress Notes (Signed)
Occupational Therapy Note  Patient Details  Name: Anna Weiss MRN: [id number] Date of Birth: [birth year]  Today's Date: 10/06/2022 OT Missed Time: 11 Minutes Missed Time Reason: Nursing care;Other (comment) (n/v when taking meds)  Pt sitting in w/c with RN present. Pt with difficulty taking medications and with n/v. Pt unable to participate.    Leotis Shames [facility] 10/06/2022, 2:24 PM

## 2022-10-07 DIAGNOSIS — S72142S Displaced intertrochanteric fracture of left femur, sequela: Secondary | ICD-10-CM | POA: Diagnosis not present

## 2022-10-07 MED ORDER — FLUCONAZOLE 100 MG PO TABS: 100.0000 mg | ORAL_TABLET | Freq: Every day | ORAL | Status: DC

## 2022-10-07 MED ORDER — FLUCONAZOLE 50 MG PO TABS
50.0000 mg | ORAL_TABLET | Freq: Every day | ORAL | Status: AC
  Administered 2022-10-07 – 2022-10-13 (×7): 50 mg via ORAL
  Filled 2022-10-07 (×7): qty 1

## 2022-10-07 NOTE — Progress Notes (Addendum)
Patient complaining of dizziness and elevated systolic pressure to 445E during therapy.  Placed orders for VS q 15 minutes times one hour and q 4 hours throughout the day and this evening. This is within her recent trend for BP and pulse over last 48 hours.

## 2022-10-07 NOTE — Patient Care Conference (Signed)
Inpatient RehabilitationTeam Conference and Plan of Care Update Date: 10/07/2022   Time: 11:33 AM    Patient Name: Anna Weiss      Medical Record Number: [id number]  Date of Birth: 09-10-1925 Sex: Female         Room/Bed: 4M07C/4M07C-01 Payor Info: Payor: MEDICARE / Plan: MEDICARE PART A AND B / Product Type: *No Product type* /    Admit Date/Time:  09/28/2022  4:56 PM  Primary Diagnosis:  Fracture, intertrochanteric, left femur, sequela  Hospital Problems: Principal Problem:   Fracture, intertrochanteric, left femur, sequela    Expected Discharge Date: Expected Discharge Date: 10/14/22  Team Members Present: Physician leading conference: Dr. Courtney Heys Social Worker Present: Ovidio Kin, LCSW Nurse Present: Tacy Learn, RN PT Present: [name], PT OT Present: Jamey Ripa, OT PPS Coordinator present : Gunnar Fusi, SLP     Current Status/Progress Goal Weekly Team Focus  Bowel/Bladder    Continent B/B   Remain continent of B/B  Toilet every 2 hours and prn    Swallow/Nutrition/ Hydration               ADL's   Mod A LB UB Setup for ADLs, Min A  for functional transfers with RW   24 hr S and HHOT   Progress LB self care with AE and amb with RW for A/IADL's    Mobility   supervision bed mobility, SBA sit to stand and stand pivot, mod A couch transfer, gait x 135 ft CGA   supervision  bed mobility, strength, transfers, gait, pt/fam education, activity tolerance    Communication                Safety/Cognition/ Behavioral Observations               Pain    Denies pain   Remain pain free  Assess pain every 4 hours and prn    Skin    Skin is CDI. Incision has staples with minimal drainage  Skin to remain free of infection and breakdown  Assess every shift and prn      Discharge Planning:  Daughter has been here and observed will ned to do hands on education so feels comfortable with Mom's care needs for home   Team  Discussion: Left femur fracture. Continent B/B. Wears pad at home for leakage. Incision to left hip/thigh is intact with staples with minimal amount of drainage. Nursing still reports issue with swallowing pills. Speech signed off.  Nursing to have patient OOB for every meal. Thrush noted on assessment. MMW and Diflucan started. Hopefully this will help in swallowing of medications. Tramadol add for pain last week which has helped to manage pain.  B/P has been elevated and will continue to monitor. However, was orthostatic this AM. O2 has been weened to RA.  Daughter does NOT want Lung CA discussed with patient. Staples out 11/21. Patient on target to meet rehab goals: yes, ambulates 35 feet CGA  *See Care Plan and progress notes for long and short-term goals.   Revisions to Treatment Plan:  Medication adjustments, monitor labs, monitor O2 and B/P  Teaching Needs: Medications, safety, gait/transfer training, skin/wound care, etc.   Current Barriers to Discharge: Decreased caregiver support, Home enviroment access/layout, Incontinence, Wound care, and Weight bearing restrictions  Possible Resolutions to Barriers: Family education, nursing education, order recommended DME     Medical Summary Current Status: low K+ and Mg- beiong repleted- pain better with tramadol- but not sleeping well-  difficulty swallowing- but sounds like thrush= already been evaluated by SLP- doesn't have issues- continent B/B but has urgency=- wears something at home  Barriers to Discharge: Electrolyte abnormality;Hypotension;Inadequate Nutritional Intake;Incontinence;Medical stability;Weight bearing restrictions  Barriers to Discharge Comments: off O2- Possible Resolutions to [employer] Focus: treating for thrush- 130 ft RW CGA-  pain better with tramadol- orthostatic this AM- usually slightly HTN- min A OT/ spinge baths at home-  still has staples- d/c- 11/21   Continued Need for Acute Rehabilitation Level of  Care: The patient requires daily medical management by a physician with specialized training in physical medicine and rehabilitation for the following reasons: Direction of a multidisciplinary physical rehabilitation program to maximize functional independence : Yes Medical management of patient stability for increased activity during participation in an intensive rehabilitation regime.: Yes Analysis of laboratory values and/or radiology reports with any subsequent need for medication adjustment and/or medical intervention. : Yes   I attest that I was present, lead the team conference, and concur with the assessment and plan of the team.   Ernest Pine 10/07/2022, 4:47 PM

## 2022-10-07 NOTE — Progress Notes (Signed)
Occupational Therapy Weekly Progress Note  Patient Details  Name: Anna Weiss MRN: [id number] Date of Birth: [birth year]  Beginning of progress report period: September 29, 2022 End of progress report period: October 07, 2022  Today's Date: 10/07/2022 OT Individual Time: 0900-0945 1st Session; 8592-9244 2nd Session  OT Individual Time Calculation (min): 45 min, 73 min    Patient has met 4 of 4 short term goals.  Pt has made significant gains since IE with all aspects of ADL's and mobility. Decreased overall pain with improved ability to transfer sit to/from stand with RW. Pt continues to struggle with intake due to medical issues which impacts overall activity tolerance but improved as indicated below. Focus of upcoming sessions will for Family Education, progressing RW amb during ADL's, AE integration, and simple IADL's.   Patient continues to demonstrate the following deficits: muscle weakness, decreased cardiorespiratoy endurance, and decreased sitting balance, decreased standing balance, decreased postural control, and decreased balance strategies and therefore will continue to benefit from skilled OT intervention to enhance overall performance with BADL, iADL, and Reduce care partner burden.  Patient progressing toward long term goals..  Continue plan of care.  OT Short Term Goals Week 1:  OT Short Term Goal 1 (Week 1): Pt will be Mod A for toileting tasks OT Short Term Goal 1 - Progress (Week 1): Met OT Short Term Goal 2 (Week 1): Pt will be Min A for functional transfers OT Short Term Goal 2 - Progress (Week 1): Met OT Short Term Goal 3 (Week 1): Pt will be Mod A for LB dressing/bathing OT Short Term Goal 3 - Progress (Week 1): Met OT Short Term Goal 4 (Week 1): Pt will require no more than 3 rest breaks OT Short Term Goal 4 - Progress (Week 1): Met Week 2:  OT Short Term Goal 1 (Week 2): STG=LTGs 2/2 LOS  Skilled Therapeutic Interventions/Progress Updates:  1st Session: Pt seen  for am OT session. Pt up in w/c and already had seen PT and been toileted however had not bathed, dressed or performed grooming. OT reassessed for weekly note and conference preparation and collaborated with pt for new goals for next sessions. Current functional level as follows: Mod A LB with AE and mod cues d/t novel use, UB Setup for ADLs, Min A for functional transfers with RW including toilet with 3 in 1 commode over top and shower transfer bench. Pt reported 0/10 pain at rest then 5/10 pain with sit to stand and standing supported with RW. Progressed pt for standing during simple sink side grooming to stand with support for 1.5-2 min trials max. Left pt up in w/c with chair exit alarm set, call button and needs within reach.   2nd Session:  Pt in bed upon OT arrival. Dtr bedside for initiating Family Educ.  Began session with 1 lb weight bar to encourage CDP and overall BP stabilization along with hydration and HOB elevated. Scap, sh, elbow 10 reps x 2 sets completed. No pain at rest in L LE but with movement can escalate to 5/10. Some yellow tinged drainage noted on shirt, bed pad and pants noted when moved to EOB. Mod a for L LE mngt. Nurse called and pt able to amb bed to toilet then back to chair with min a with RW. Toileting with close S, LB garment and pull up management with mod A. Doffed soiled pants and donned with mod A. Pullover shirt with set up only. Nursing performed dressing change and  pt agreed to OOB in recliner. Prior to end of session VSR: BP 153/60 O2 sats 97% HR 70 RR 16. Pt left up in recliner with LE's elevated, chair exit set, nurse call button and needs within reach.     Therapy Documentation Precautions:  Precautions Precautions: Fall, Other (comment) Precaution Comments: monitor O2, orthostatics Restrictions Weight Bearing Restrictions: Yes LLE Weight Bearing: Weight bearing as tolerated   Therapy/Group: Individual Therapy  Anna Weiss 10/07/2022, 7:47 AM

## 2022-10-07 NOTE — Progress Notes (Signed)
Patient ID: Anna Weiss, female   DOB: May 15, 1925, 88 y.o.   MRN: [id number]  Met with pt and daughter who is present to give team conference update regarding progress this week and discharge still 11/21. {Pt is not having a good day today and having difficulty with throat-MD feels she has thrush and has begun treatment. Daughter reports has all needed equipment since son works for [employer]. Have scheduled daughter to do hands on training for Friday from 1;00-3;00 Will let team know. Daughter wants to make sure she can manage pt at discharge will see on Friday and hope pt is feeling better by then.

## 2022-10-07 NOTE — Progress Notes (Signed)
Physical Therapy Session Note  Patient Details  Name: Anna Weiss MRN: [id number] Date of Birth: 1925/11/08  Today's Date: 10/07/2022 PT Individual Time: 3343-5686 PT Individual Time Calculation (min): 42 min   Short Term Goals: Week 2:  PT Short Term Goal 1 (Week 2): STG=LTG due to ELOS  Skilled Therapeutic Interventions/Progress Updates:      Therapy Documentation Precautions:  Precautions Precautions: Fall, Other (comment) Precaution Comments: monitor O2, orthostatics Restrictions Weight Bearing Restrictions: Yes LLE Weight Bearing: Weight bearing as tolerated  Pt received semi-reclined in bed and agreeable to PT session with emphasis on gait and transfer training. Pt with unrated L LE pain and provided with rest breaks and repositioning for relief. Pt requires supervision for supine to sit edge of bed with bed rails and for static/dynamic sitting balance. MD arrived and rounded with patient during session. Pt requires SBA with sit to stand with RW with verbal cues for hand placement and PT provided total A for lower body dressing for time management. Pt ambulated 135 ft with RW and CGA to ADL apartment and with verbal cueing able to progress from step to gait to step through pattern. Pt with decreased activity tolerance and requires 3 standing rest breaks during gait. Pt participated in sit to stand from low level couch and required mod A. Pt transported to room and left seated in w/c at bedside with chair alarm on and all needs within reach.   Therapy/Group: Individual Therapy  [name] [name] PT, DPT  10/07/2022, 7:47 AM

## 2022-10-07 NOTE — Progress Notes (Signed)
Physical Therapy Session Note  Patient Details  Name: Anna Weiss MRN: [id number] Date of Birth: 1924-12-18  Today's Date: 10/07/2022 PT Individual Time: 6381-7711 PT Individual Time Calculation (min): 30 min   Short Term Goals: Week 1:  PT Short Term Goal 1 (Week 1): pt will transfer supine<>sitting EOB with min A consistantly PT Short Term Goal 1 - Progress (Week 1): Met PT Short Term Goal 2 (Week 1): pt will transfer sit<>stand with LRAD and mod A PT Short Term Goal 2 - Progress (Week 1): Met PT Short Term Goal 3 (Week 1): pt will transfer bed<>WC with LRAD and mod A PT Short Term Goal 3 - Progress (Week 1): Met  Skilled Therapeutic Interventions/Progress Updates: Pt presents sitting in w/c and agreeable to therapy although c/o sleepiness.  Pt wheeled to main gym for energy conservation.  Pt transfers sit to stand w/ min A and RW.  Pt amb w/ reciprocal gait pattern but states pain to L LE.  Pt amb x 12' w/ min A and then c/o dizziness and "feeling like I'm going to pass out."  Pt sat down in w/c, HR of 47-50 bpm, BP 178/58.  Pt returned to room, nsg notified and pt transfers sit to stand and then step-pivot to bed w/ min A.  Pt required mod A for LLE sit to supine.  BP re-checked at 175/50 but states symptoms resolved.  Nsg notified of pt disposition.  Bed alarm on and all needs in reach.  Missed PT time of 15 minutes.      Therapy Documentation Precautions:  Precautions Precautions: Fall, Other (comment) Precaution Comments: monitor O2, orthostatics Restrictions Weight Bearing Restrictions: Yes LLE Weight Bearing: Weight bearing as tolerated General:   Vital Signs: Therapy Vitals BP: (!) 175/50 Pain:7/10 Pain Assessment Pain Scale: 0-10 Pain Score: 0-No pain Mobility:       Therapy/Group: Individual Therapy  [name] 10/07/2022, 11:27 AM

## 2022-10-07 NOTE — Progress Notes (Signed)
PROGRESS NOTE   Subjective/Complaints:  Difficulty swallowing- but also notes things still taste Weird- and textures are "different" when swallows them- a little uncomfortable.  Also regurgitated pills yesterday x1-  Mouth still feels real dry.   On Protonix 40 mg BID  ROS:  Pt denies SOB, abd pain, CP, N/V/C/D, and vision changes    Objective:   No results found. Recent Labs    10/06/22 0520  WBC 7.8  HGB 8.4*  HCT 24.1*  PLT 346   Recent Labs    10/06/22 0520  NA 137  K 3.1*  CL 95*  CO2 32  GLUCOSE 106*  BUN 6*  CREATININE 0.71  CALCIUM 8.9    Intake/Output Summary (Last 24 hours) at 10/07/2022 0904 Last data filed at 10/07/2022 0826 Gross per 24 hour  Intake 120 ml  Output --  Net 120 ml        Physical Exam: Vital Signs Blood pressure (!) 151/44, pulse (!) 57, temperature 97.9 F (36.6 C), temperature source Oral, resp. rate 17, height '5\' 7"'$  (1.702 m), weight 72.7 kg, SpO2 94 %.   General: awake, alert, appropriate, sitting EOB;  NAD HENT: conjugate gaze; oropharynx less dry- off O2- questionable plaques in back of throat- not in oropharynx, but tongue still furrowed CV: regular rate; no JVD Pulmonary: CTA B/L; no W/R/R- good air movement GI: soft, NT, ND, (+)BS Psychiatric: appropriate; interactive Neurological: Ox3 Musculoskeletal:     Cervical back: Normal range of motion.     Comments: Left hip tender, still swollen - bruised- mainly yellow-  Skin: L hip less swelling; dressing C/D/I    General: Skin is warm.     Findings: Bruising (arms) present.     Comments: Left hip incision CDI with dry dressing- no drainage through dressing- eating so could not unwrap. . Left ear as above. L ear we've removed bolster- still really bruised  Neurological:     Mental Status: She is alert and oriented to person, place, and time.     Cranial Nerves: No cranial nerve deficit.     Sensory: No  sensory deficit.     Comments: Motor: 4-5/5 UE's. RLE 3/5 prox to 5/5 distal. LLE 1-2/5 (pain) to 4/5 distally. DTR's 1+, no abnl tone or ataxia   Assessment/Plan: 1. Functional deficits which require 3+ hours per day of interdisciplinary therapy in a comprehensive inpatient rehab setting. Physiatrist is providing close team supervision and 24 hour management of active medical problems listed below. Physiatrist and rehab team continue to assess barriers to discharge/monitor patient progress toward functional and medical goals  Care Tool:  Bathing    Body parts bathed by patient: Right arm, Left arm, Chest, Abdomen, Front perineal area, Right upper leg, Face   Body parts bathed by helper: Left upper leg, Right lower leg, Left lower leg, Buttocks     Bathing assist Assist Level: Moderate Assistance - Patient 50 - 74%     Upper Body Dressing/Undressing Upper body dressing   What is the patient wearing?: Pull over shirt    Upper body assist Assist Level: Supervision/Verbal cueing    Lower Body Dressing/Undressing Lower body dressing  What is the patient wearing?: Incontinence brief, Pants     Lower body assist Assist for lower body dressing: Maximal Assistance - Patient 25 - 49%     Toileting Toileting    Toileting assist Assist for toileting: Moderate Assistance - Patient 50 - 74%     Transfers Chair/bed transfer  Transfers assist  Chair/bed transfer activity did not occur: Safety/medical concerns (dizziness, pain, decreased balance)  Chair/bed transfer assist level: Moderate Assistance - Patient 50 - 74%     Locomotion Ambulation   Ambulation assist   Ambulation activity did not occur: Safety/medical concerns (dizziness, pain, decreased balance)  Assist level: Minimal Assistance - Patient > 75% Assistive device: Walker-rolling Max distance: 30   Walk 10 feet activity   Assist  Walk 10 feet activity did not occur: Safety/medical concerns (dizziness,  pain, decreased balance)  Assist level: Minimal Assistance - Patient > 75% Assistive device: Walker-rolling   Walk 50 feet activity   Assist Walk 50 feet with 2 turns activity did not occur: Safety/medical concerns         Walk 150 feet activity   Assist Walk 150 feet activity did not occur: Safety/medical concerns         Walk 10 feet on uneven surface  activity   Assist Walk 10 feet on uneven surfaces activity did not occur: Safety/medical concerns (dizziness, pain, decreased balance)         Wheelchair     Assist Is the patient using a wheelchair?: Yes Type of Wheelchair: Manual Wheelchair activity did not occur: Safety/medical concerns (dizziness, pain, decreased balance)         Wheelchair 50 feet with 2 turns activity    Assist    Wheelchair 50 feet with 2 turns activity did not occur: Safety/medical concerns (dizziness, pain, decreased balance)       Wheelchair 150 feet activity     Assist  Wheelchair 150 feet activity did not occur: Safety/medical concerns (dizziness, pain, decreased balance)       Blood pressure (!) 151/44, pulse (!) 57, temperature 97.9 F (36.6 C), temperature source Oral, resp. rate 17, height '5\' 7"'$  (1.702 m), weight 72.7 kg, SpO2 94 %.  Medical Problem List and Plan: 1. Functional deficits secondary to left intertrochanteric femur fx after fall. Pt s/p IMN 09/25/22             -LLE WBAT             -patient may shower             -ELOS/Goals: 15-20 days  D/c set for 11/21  Con't CIR- PT, OT-  Team conference today to f/u on progress 2.  Antithrombotics: -DVT/anticoagulation:  xarelto '10mg'$  daily for 14 days             -antiplatelet therapy:  resume plavix '75mg'$  daily 3. Pain Management: tylenol and hydrocodone prn for pain             -robaxin prn for muscle spasms  11/8- pain limiting therapy- doesn't want opiates- mainly using tylenol-   11/9- d/w pt- she's willing to try tramadol 50 mg q6 hours prn-  she's scared of Coding if takes Norco- I explained that Tramadol is mild and shouldn't cause any cognitive issues.   11/10- pain much better- not resolved, but better with tramadol- con't tramadol and tylenol prn  11/14- pain still doing better with tramadol 4. Mood/Behavior/Sleep: team to provide ego support             -  antipsychotic agents: n/a 5. Neuropsych/cognition: This patient is capable of making decisions on her own behalf. 6. Skin/Wound Care: dry dressings to left ear and hip  11/9- having some drainage- from L hip- is improved this Am- will monitor closely.  7. Fluids/Electrolytes/Nutrition: encourage appropriate PO             -pt's intake has inconsistent so far 8. Nausea:              -might be positional or med related.              -also having associated gas             -pt told me she's been moving bowels, however I don't see a bm recorded             -will try sorbitol today, dulcolax suppository prn             -protonix 11/8- has ileus- per #15 below  11/9- still having nausea- will change diet to clear liquid per pt request- and monitor  11/10- on clear liquid diet- pt wants to wait 1 more day before changing to regular diet- if not better tomorrow, will need to call GI  11/14- nausea resolved- back on regular diet 9. Left lower lobe consolidation             -pneumonia ruled out. Likely atelectasis             -IS, OOB             -wean oxygen to off  11/7- will write order to wean O2.  10. Sinus bradycardia:             -on propranolol and verapamil at home             -continue to hold as HR in 50's  11/14- since still in 50s, will not restart  11. Essential HTN             -continue hctz  11/14- BP usually controlled but up today- will monitor for trend            Vitals:   10/06/22 2006 10/07/22 0524  BP: (!) 163/41 (!) 151/44  Pulse: (!) 51 (!) 57  Resp: 16 17  Temp: 97.8 F (36.6 C) 97.9 F (36.6 C)  SpO2: 94% 94%    12. Right middle lobe  nodule 2.3 cm- should be considered primary lung CA until ruled out- also has nodule as well 47m on R middle lung- could be mets per CT scan.              -f/u as outpt  11/7- d/w daughter- she said no f/u at this time- due to pt's age.   11/8- daughter doesn't want to let pt know about tumors.   13. Mild leukocytosis- 11.2   11/6- afebrile- other than difficulty swallowing- says it's dry?; denies UTI Sx's- if gets worse clinically, will recheck in AM- right now scheduled for Wed AM.   11/8- WBC down to 9.4-  14. Constipation  11/6- LBM on day of fall per pt and chart- will give Sorbitol after therapy and follow with SSE if needed.   11/7- had multiple BM's- didn't need SSE- per notes- feels cleaned out  15. ABLA  11/6- Down to 7.6- will recheck Wednesday to make sure not dropping further- and transfuse if required.   11/8- Hb 7.3- on edge of needing transfusion.   11/9- Hb  up to 8.0-   11/10- Hb 7.7- will con't to monitor closely.  Recheck in am 11/13 16. Post op ileus resolved BM x 3 11/10, 1 BM 11/12- advance diet as tolerated  17. Thrush?  11/9- will add Magic mouthwash- don't see plaques, but tongue dry and furrowed. Says things taste 'wierd".   11/10- said magic mouthwash helping Sx's.  18. Leukocytosis improved - recheck in am       Latest Ref Rng & Units 10/06/2022    5:20 AM 10/03/2022    5:26 AM 10/02/2022    6:14 AM  CBC  WBC 4.0 - 10.5 K/uL 7.8  10.0  12.6   Hemoglobin 12.0 - 15.0 g/dL 8.4  7.7  8.0   Hematocrit 36.0 - 46.0 % 24.1  23.0  22.8   Platelets 150 - 400 K/uL 346  320  310     19. Hypokalemia and low Mg  11/10- repleted up to 3.5- will put on Kcl 10 mEq daily for now and recheck Monday- since K+ still 3.5- will also check Mg Monday as well   11/14= Mg 1.6 and K+ 3.1- repleted again yesterday by pharmacy for [place]- will recheck labs iN AM 20. Swallowing issues  11/14- appears to be thrush- will start Diflucan- due to renal issues, will need to be 50 mg daily- x  7 days doses-    I spent a total of 39    minutes on total care today- >50% coordination of care- due to  team conference and d/w PT about swallowing as well as prolonged time with pt.    LOS: 9 days A FACE TO FACE EVALUATION WAS PERFORMED  Lallie Strahm 10/07/2022, 9:04 AM

## 2022-10-08 DIAGNOSIS — S72142S Displaced intertrochanteric fracture of left femur, sequela: Secondary | ICD-10-CM | POA: Diagnosis not present

## 2022-10-08 LAB — BASIC METABOLIC PANEL
Anion gap: 11 (ref 5–15)
BUN: <5 mg/dL — ABNORMAL LOW (ref 8–23)
CO2: 29 mmol/L (ref 22–32)
Calcium: 9 mg/dL (ref 8.9–10.3)
Chloride: 94 mmol/L — ABNORMAL LOW (ref 98–111)
Creatinine, Ser: 0.64 mg/dL (ref 0.44–1.00)
GFR, Estimated: >60 mL/min (ref >60)
Glucose, Bld: 110 mg/dL — ABNORMAL HIGH (ref 70–99)
Potassium: 4.2 mmol/L (ref 3.5–5.1)
Sodium: 134 mmol/L — ABNORMAL LOW (ref 135–145)

## 2022-10-08 LAB — MAGNESIUM: Magnesium: 1.6 mg/dL — ABNORMAL LOW (ref 1.7–2.4)

## 2022-10-08 MED ORDER — HYDRALAZINE HCL 25 MG PO TABS
25.0000 mg | ORAL_TABLET | Freq: Three times a day (TID) | ORAL | Status: DC
  Administered 2022-10-08 – 2022-10-28 (×56): 25 mg via ORAL
  Filled 2022-10-08 (×56): qty 1

## 2022-10-08 MED ORDER — MAGNESIUM SULFATE 2 GM/50ML IV SOLN
2.0000 g | Freq: Once | INTRAVENOUS | Status: AC
  Administered 2022-10-08: 2 g via INTRAVENOUS
  Filled 2022-10-08: qty 50

## 2022-10-08 MED ORDER — LIDOCAINE HCL URETHRAL/MUCOSAL 2 % EX GEL: 1.0000 | CUTANEOUS | Status: DC | PRN

## 2022-10-08 MED ORDER — HYDRALAZINE HCL 25 MG PO TABS: 25.0000 mg | ORAL_TABLET | Freq: Three times a day (TID) | ORAL | Status: DC

## 2022-10-08 NOTE — Progress Notes (Addendum)
PROGRESS NOTE   Subjective/Complaints:  Pt reports swallowing is somewhat better this AM- Ate 1/2 breakfast. Mouth more "moist".   Also, feeling dizzy/lightheaded more- like head is spinning somewhat and describes head as "airy and lighter".   Also almost "passed out" in therapy yesterday.    ROS:  Pt denies SOB, abd pain, CP, N/V/C/D, and vision changes Except per HPI   Objective:   No results found. Recent Labs    10/06/22 0520  WBC 7.8  HGB 8.4*  HCT 24.1*  PLT 346   Recent Labs    10/06/22 0520 10/08/22 0638  NA 137 134*  K 3.1* 4.2  CL 95* 94*  CO2 32 29  GLUCOSE 106* 110*  BUN 6* <5*  CREATININE 0.71 0.64  CALCIUM 8.9 9.0    Intake/Output Summary (Last 24 hours) at 10/08/2022 0802 Last data filed at 10/07/2022 1822 Gross per 24 hour  Intake 840 ml  Output --  Net 840 ml        Physical Exam: Vital Signs Blood pressure (!) 156/48, pulse (!) 57, temperature 98.4 F (36.9 C), temperature source Oral, resp. rate 18, height '5\' 7"'$  (1.702 m), weight 72.7 kg, SpO2 92 %.    General: awake, alert, appropriate,sitting up in bed; ate 1/2 breakfast; NAD HENT: conjugate gaze; oropharynx moist CV: regular to mildly bradycardic rate; no JVD Pulmonary: CTA B/L; no W/R/R- good air movement GI: soft, NT, ND, (+)BS Psychiatric: appropriate Neurological: Ox3 Musculoskeletal:     Cervical back: Normal range of motion.     Comments: Left hip tender, still swollen - bruised- mainly yellow-  much better- foam dressing over L hip C/D/I Skin: L hip less swelling; dressing C/D/I    General: Skin is warm.     Findings: Bruising (arms) present.     Comments: Left hip incision CDI with dry dressing- no drainage through dressing- eating so could not unwrap. . Left ear as above. L ear we've removed bolster- still really bruised  Neurological:     Mental Status: She is alert and oriented to person, place, and  time.     Cranial Nerves: No cranial nerve deficit.     Sensory: No sensory deficit.     Comments: Motor: 4-5/5 UE's. RLE 3/5 prox to 5/5 distal. LLE 1-2/5 (pain) to 4/5 distally. DTR's 1+, no abnl tone or ataxia   Assessment/Plan: 1. Functional deficits which require 3+ hours per day of interdisciplinary therapy in a comprehensive inpatient rehab setting. Physiatrist is providing close team supervision and 24 hour management of active medical problems listed below. Physiatrist and rehab team continue to assess barriers to discharge/monitor patient progress toward functional and medical goals  Care Tool:  Bathing    Body parts bathed by patient: Right arm, Left arm, Chest, Abdomen, Front perineal area, Right upper leg, Face, Left upper leg   Body parts bathed by helper: Left lower leg, Right lower leg, Buttocks     Bathing assist Assist Level: Moderate Assistance - Patient 50 - 74%     Upper Body Dressing/Undressing Upper body dressing   What is the patient wearing?: Pull over shirt    Upper body assist Assist Level:  Set up assist    Lower Body Dressing/Undressing Lower body dressing      What is the patient wearing?: Incontinence brief, Pants     Lower body assist Assist for lower body dressing: Moderate Assistance - Patient 50 - 74%     Toileting Toileting    Toileting assist Assist for toileting: Supervision/Verbal cueing     Transfers Chair/bed transfer  Transfers assist  Chair/bed transfer activity did not occur: Safety/medical concerns (dizziness, pain, decreased balance)  Chair/bed transfer assist level: Minimal Assistance - Patient > 75%     Locomotion Ambulation   Ambulation assist   Ambulation activity did not occur: Safety/medical concerns (dizziness, pain, decreased balance)  Assist level: Minimal Assistance - Patient > 75% Assistive device: Walker-rolling Max distance: 12   Walk 10 feet activity   Assist  Walk 10 feet activity did not  occur: Safety/medical concerns (dizziness, pain, decreased balance)  Assist level: Minimal Assistance - Patient > 75% Assistive device: Walker-rolling   Walk 50 feet activity   Assist Walk 50 feet with 2 turns activity did not occur: Safety/medical concerns         Walk 150 feet activity   Assist Walk 150 feet activity did not occur: Safety/medical concerns         Walk 10 feet on uneven surface  activity   Assist Walk 10 feet on uneven surfaces activity did not occur: Safety/medical concerns (dizziness, pain, decreased balance)         Wheelchair     Assist Is the patient using a wheelchair?: Yes Type of Wheelchair: Manual Wheelchair activity did not occur: Safety/medical concerns (dizziness, pain, decreased balance)         Wheelchair 50 feet with 2 turns activity    Assist    Wheelchair 50 feet with 2 turns activity did not occur: Safety/medical concerns (dizziness, pain, decreased balance)       Wheelchair 150 feet activity     Assist  Wheelchair 150 feet activity did not occur: Safety/medical concerns (dizziness, pain, decreased balance)       Blood pressure (!) 156/48, pulse (!) 57, temperature 98.4 F (36.9 C), temperature source Oral, resp. rate 18, height '5\' 7"'$  (1.702 m), weight 72.7 kg, SpO2 92 %.  Medical Problem List and Plan: 1. Functional deficits secondary to left intertrochanteric femur fx after fall. Pt s/p IMN 09/25/22             -LLE WBAT             -patient may shower             -ELOS/Goals: 15-20 days  D/c set for 11/21  Con't CIR- PT, OT 2.  Antithrombotics: -DVT/anticoagulation:  xarelto '10mg'$  daily for 14 days             -antiplatelet therapy:  resume plavix '75mg'$  daily 3. Pain Management: tylenol and hydrocodone prn for pain             -robaxin prn for muscle spasms  11/8- pain limiting therapy- doesn't want opiates- mainly using tylenol-   11/9- d/w pt- she's willing to try tramadol 50 mg q6 hours prn-  she's scared of Coding if takes Norco- I explained that Tramadol is mild and shouldn't cause any cognitive issues.   11/10- pain much better- not resolved, but better with tramadol- con't tramadol and tylenol prn  11/14- pain still doing better with tramadol 4. Mood/Behavior/Sleep: team to provide ego support             -  antipsychotic agents: n/a 5. Neuropsych/cognition: This patient is capable of making decisions on her own behalf. 6. Skin/Wound Care: dry dressings to left ear and hip  11/9- having some drainage- from L hip- is improved this Am- will monitor closely.  7. Fluids/Electrolytes/Nutrition: encourage appropriate PO             -pt's intake has inconsistent so far 8. Nausea:              -might be positional or med related.              -also having associated gas             -pt told me she's been moving bowels, however I don't see a bm recorded             -will try sorbitol today, dulcolax suppository prn             -protonix 11/8- has ileus- per #15 below  11/9- still having nausea- will change diet to clear liquid per pt request- and monitor  11/10- on clear liquid diet- pt wants to wait 1 more day before changing to regular diet- if not better tomorrow, will need to call GI  11/14- nausea resolved- back on regular diet 9. Left lower lobe consolidation             -pneumonia ruled out. Likely atelectasis             -IS, OOB             -wean oxygen to off  11/7- will write order to wean O2.  10. Sinus bradycardia:             -on propranolol and verapamil at home             -continue to hold as HR in 50's  11/14- since still in 50s, will not restart  11. Essential HTN             -continue hctz  11/14- BP usually controlled but up today- will monitor for trend  11/15- somewhat elevated, but not sure if dropping- see #21            Vitals:   10/07/22 1906 10/08/22 0500  BP: (!) 156/48 (!) 156/48  Pulse: 76 (!) 57  Resp: 18 18  Temp: (!) 97.5 F (36.4 C) 98.4  F (36.9 C)  SpO2: 96% 92%    12. Right middle lobe nodule 2.3 cm- should be considered primary lung CA until ruled out- also has nodule as well 13m on R middle lung- could be mets per CT scan.              -f/u as outpt  11/7- d/w daughter- she said no f/u at this time- due to pt's age.   11/8- daughter doesn't want to let pt know about tumors.   13. Mild leukocytosis- 11.2   11/6- afebrile- other than difficulty swallowing- says it's dry?; denies UTI Sx's- if gets worse clinically, will recheck in AM- right now scheduled for Wed AM.   11/8- WBC down to 9.4-  14. Constipation  11/6- LBM on day of fall per pt and chart- will give Sorbitol after therapy and follow with SSE if needed.   11/7- had multiple BM's- didn't need SSE- per notes- feels cleaned out  15. ABLA  11/6- Down to 7.6- will recheck Wednesday to make sure not dropping further- and transfuse if required.   11/8-  Hb 7.3- on edge of needing transfusion.   11/9- Hb up to 8.0-   11/10- Hb 7.7- will con't to monitor closely.   11/15- Hb 8.4 16. Post op ileus resolved BM x 3 11/10, 1 BM 11/12- advance diet as tolerated  17. Thrush?  11/9- will add Magic mouthwash- don't see plaques, but tongue dry and furrowed. Says things taste 'wierd".   11/10- said magic mouthwash helping Sx's.  18. Leukocytosis improved -   11/15- WBC down to 7.8      Latest Ref Rng & Units 10/06/2022    5:20 AM 10/03/2022    5:26 AM 10/02/2022    6:14 AM  CBC  WBC 4.0 - 10.5 K/uL 7.8  10.0  12.6   Hemoglobin 12.0 - 15.0 g/dL 8.4  7.7  8.0   Hematocrit 36.0 - 46.0 % 24.1  23.0  22.8   Platelets 150 - 400 K/uL 346  320  310     19. Hypokalemia and low Mg  11/10- repleted up to 3.5- will put on Kcl 10 mEq daily for now and recheck Monday- since K+ still 3.5- will also check Mg Monday as well   11/14= Mg 1.6 and K+ 3.1- repleted again  yesterday by pharmacy for [place]- will recheck labs iN AM  11/15- K+ 4.2- will give IV Mg 2G for Mg 1.6- and recheck in  AM 20. Swallowing issues  11/14- appears to be thrush- will start Diflucan- due to renal issues, will need to be 50 mg daily- x 7 days doses-   11/15- reports things taste better and was able ot swallow some this AM of breakfast-con't diflucan 21. Dizziness/lightheadedness/presyncope  11/15- will have pt get orthostatics checked by nursing and therapy- BP on high side, so not sure if Sx's due to BP being high or dropping.    I spent a total of 42   minutes on total care today- >50% coordination of care- due to d/w PT and OT about dizziness/lightheadedness- BP is on high side, but concerned to bring down- since could cause orthostasis more-   Addendum- will add Hydralazine 25 mg q8 hours for BP's running 160s-180s with therapy- no orthostasis base don PT notes and d/w me.   LOS: 10 days A FACE TO FACE EVALUATION WAS PERFORMED  Estalene Bergey 10/08/2022, 8:02 AM

## 2022-10-08 NOTE — Progress Notes (Signed)
Therapy notified this nurse that patient was incontinent and had large volume of retention. I/O cath done. Reviewed chart and urine has been continent with some "stress incontinence". However I was told that patient has incontinence every day. Placed order for timed toileting every 2/3 hours and PVR after voiding. Notified nurse via chat of new orders. Patient also let therapy know was cath'd prior to coming to rehab, however, I do not see that that has happened this admission.

## 2022-10-08 NOTE — Progress Notes (Addendum)
Occupational Therapy Session Note  Patient Details  Name: Anna Weiss MRN: [id number] Date of Birth: 1925/07/23  Today's Date: 10/08/2022 OT Individual Time: 1300-1345 OT Individual Time Calculation (min): 45 min  OT Missed Minutes: 15 min (Catheterization)    Short Term Goals: Week 2:  OT Short Term Goal 1 (Week 2): STG=LTGs 2/2 LOS  Skilled Therapeutic Interventions/Progress Updates:  Pt received supine in bed for skilled OT session with focus on ADL retraining and OOB activity due to increased fatigue. Pt agreeable to interventions, demonstrating an overall calm mood. Pt reported 6/10 pain, stating "it's when I get up and moving" in reference to her L hip. OT offered intermediate rest breaks and positioning suggestions throughout session to address pain/fatigue and maximize participation/safety in session.   Pt completes toileting and FB dressing this session with overall Min A + Mod-Max Vcs for use of compensatory techniques and adaptive equipment. Pt performed stand-step transfers EOB<>BSC and EOB>BS chair with overall Min A + RW, pt with one instance of LOB. On BSC, pt completed front peri-hygiene seated. Pt doffs/dons shirt seated on BSC, using reacher to thread bilat legs and donning over bottom in standing with RW + Min A. Pt uses sock aid to don bilat socks with Vcs + visual demonstration for correct sequencing of use. Pt with urinary urgency during and in prior sessions resulting in bladder scan, and eventual catheterization during the end of this session.   Pt remained supine in bed with all immediate needs meet at end of session. Pt continues to be appropriate for skilled OT intervention to address activity tolerance and functional mobility promoting further functional independence.     Therapy Documentation Precautions:  Precautions Precautions: Fall, Other (comment) Precaution Comments: monitor O2, orthostatics Restrictions Weight Bearing Restrictions: Yes LLE Weight  Bearing: Weight bearing as tolerated    Therapy/Group: Individual Therapy  Maudie Mercury, OTR/L, MSOT  10/08/2022, 2:40 PM

## 2022-10-08 NOTE — Progress Notes (Signed)
Physical Therapy Session Note  Patient Details  Name: Anna Weiss MRN: [id number] Date of Birth: [birth year]  Today's Date: 10/08/2022 PT Individual Time: 0915-0954 PT Individual Time Calculation (min): 39 min  and Today's Date: 10/08/2022 PT Missed Time: 21 Minutes Missed Time Reason: Other (Comment) (symptomatic HTN)  Short Term Goals: Week 2:  PT Short Term Goal 1 (Week 2): STG=LTG due to ELOS  Skilled Therapeutic Interventions/Progress Updates:      Therapy Documentation Precautions:  Precautions Precautions: Fall, Other (comment) Precaution Comments: monitor O2, orthostatics Restrictions Weight Bearing Restrictions: Yes LLE Weight Bearing: Weight bearing as tolerated  Pt received seated in w/c at bedside and reports dizziness and feeling "shaky". Pt reports mild HA in session and requires CGA with sit to stand and with ambulation with RW to bed. Pt requires min A for sit to lying and orthostatic vitals assessed and as followed:   Supine: 182/53 (89), pulse 57  Sitting: 179/60 (95), pulse 67   Standing: 184/151 (162), pulse 117 (pt presents with significant tremors- inaccurate)   Sitting: 168/74 (98), pulse 63   Supine: 165/44 (81), pulse 55  PT notified MD, PA, and nursing and pt left semi-reclined in bed with all needs in reach.    Therapy/Group: Individual Therapy  [name] [name] PT, DPT  10/08/2022, 7:44 AM

## 2022-10-08 NOTE — Progress Notes (Signed)
Physical Therapy Session Note  Patient Details  Name: Anna Weiss MRN: [id number] Date of Birth: [birth year]  Today's Date: 10/08/2022 PT Individual Time: 0800-0845 PT Individual Time Calculation (min): 45 min   Short Term Goals: Week 2:  PT Short Term Goal 1 (Week 2): STG=LTG due to ELOS  Skilled Therapeutic Interventions/Progress Updates:      Pt resting in bed on arrival. She's agreeable to therapy but reports she has been feeling "wobbly" in her head. Reports that she felt like she was going to pass out yesterday during therapy after taking only a few steps.   BP taken supine, sitting, standing: Supine:  148/47 HR 48 Sitting:    160/58 HR 68 Standing:  162/75 HR 85 *Pt reports she feels like her head is moving, worsens when standing. Pt quite noxious, emesis bag in hand during session.   Required modA for donning pants at bed level - assist for threading and pulling to hips - pt able to lateral lean to pull over hips, although painful. Required min/modA for supine<>sitting EOB for trunk and BLE support. Able to forward scoot to EOB without assist.   Pt found to have stress incontinence when she stood for vitals, incontinent of urine. Stand<>pivot transfer using RW with minA to w/c. Wheeled to bathroom due to feeling unwell. Stand<>pivot to commode using grab bars and minA - assist needed for managing clothes and removing soiled brief. Pt continent of urine and loose stool once on toilet. Assist for posterior pericare in standing with pt using grab bars for balance.   Assisted to recliner via stand<>pivot transfer, using RW and minA. Concluded session in recliner with seat belt alarm on, BLE elevated, call bell in reach.   Therapy Documentation Precautions:  Precautions Precautions: Fall, Other (comment) Precaution Comments: monitor O2, orthostatics Restrictions Weight Bearing Restrictions: Yes LLE Weight Bearing: Weight bearing as tolerated General:     Therapy/Group:  Individual Therapy  Keisi Eckford P Vaughn Beaumier PT 10/08/2022, 7:39 AM

## 2022-10-08 NOTE — Progress Notes (Signed)
Occupational Therapy Note  Patient Details  Name: JAKALA HERFORD MRN: [id number] Date of Birth: [birth year]  Today's Date: 10/08/2022 OT Missed Time: 50 Minutes Missed Time Reason: Patient ill (comment)  Pt declined out of bed at this time due to not feeling well - addressed needs and left resting in the bed.  Did enjoy a brief visit from the therapy dog.   [facility] 10/08/2022, 3:23 PM

## 2022-10-09 ENCOUNTER — Inpatient Hospital Stay (HOSPITAL_COMMUNITY): Payer: Medicare Other

## 2022-10-09 ENCOUNTER — Inpatient Hospital Stay (HOSPITAL_COMMUNITY): Payer: Medicare Other | Admitting: Anesthesiology

## 2022-10-09 ENCOUNTER — Encounter (HOSPITAL_COMMUNITY)
Admission: RE | Disposition: A | Discharge status: Skilled Nursing Facility | Payer: Self-pay | Source: Intra-hospital | Attending: Physical Medicine and Rehabilitation

## 2022-10-09 DIAGNOSIS — T8452XA Infection and inflammatory reaction due to internal left hip prosthesis, initial encounter: Secondary | ICD-10-CM

## 2022-10-09 DIAGNOSIS — I10 Essential (primary) hypertension: Secondary | ICD-10-CM

## 2022-10-09 DIAGNOSIS — S72142S Displaced intertrochanteric fracture of left femur, sequela: Secondary | ICD-10-CM | POA: Diagnosis not present

## 2022-10-09 DIAGNOSIS — L24A9 Irritant contact dermatitis due friction or contact with other specified body fluids: Secondary | ICD-10-CM | POA: Diagnosis not present

## 2022-10-09 DIAGNOSIS — D638 Anemia in other chronic diseases classified elsewhere: Secondary | ICD-10-CM

## 2022-10-09 DIAGNOSIS — E039 Hypothyroidism, unspecified: Secondary | ICD-10-CM

## 2022-10-09 HISTORY — PX: I & D EXTREMITY: SHX5045

## 2022-10-09 LAB — URINALYSIS, ROUTINE W REFLEX MICROSCOPIC
Bilirubin Urine: NEGATIVE
Glucose, UA: NEGATIVE mg/dL
Hgb urine dipstick: NEGATIVE
Ketones, ur: NEGATIVE mg/dL
Leukocytes,Ua: NEGATIVE
Nitrite: NEGATIVE
Protein, ur: NEGATIVE mg/dL
Specific Gravity, Urine: 1.004 — ABNORMAL LOW (ref 1.005–1.030)
pH: 8 (ref 5.0–8.0)

## 2022-10-09 LAB — COMPREHENSIVE METABOLIC PANEL
ALT: 15 U/L (ref 0–44)
AST: 24 U/L (ref 15–41)
Albumin: 3 g/dL — ABNORMAL LOW (ref 3.5–5.0)
Alkaline Phosphatase: 77 U/L (ref 38–126)
Anion gap: 8 (ref 5–15)
BUN: <5 mg/dL — ABNORMAL LOW (ref 8–23)
CO2: 28 mmol/L (ref 22–32)
Calcium: 8.7 mg/dL — ABNORMAL LOW (ref 8.9–10.3)
Chloride: 97 mmol/L — ABNORMAL LOW (ref 98–111)
Creatinine, Ser: 0.73 mg/dL (ref 0.44–1.00)
GFR, Estimated: >60 mL/min (ref >60)
Glucose, Bld: 107 mg/dL — ABNORMAL HIGH (ref 70–99)
Potassium: 4.1 mmol/L (ref 3.5–5.1)
Sodium: 133 mmol/L — ABNORMAL LOW (ref 135–145)
Total Bilirubin: 2.1 mg/dL — ABNORMAL HIGH (ref 0.3–1.2)
Total Protein: 5.1 g/dL — ABNORMAL LOW (ref 6.5–8.1)

## 2022-10-09 LAB — CBC WITH DIFFERENTIAL/PLATELET
Abs Immature Granulocytes: 0 10*3/uL (ref 0.00–0.07)
Basophils Absolute: 0.2 10*3/uL — ABNORMAL HIGH (ref 0.0–0.1)
Basophils Relative: 2 %
Eosinophils Absolute: 0.1 10*3/uL (ref 0.0–0.5)
Eosinophils Relative: 1 %
HCT: 24.6 % — ABNORMAL LOW (ref 36.0–46.0)
Hemoglobin: 8.2 g/dL — ABNORMAL LOW (ref 12.0–15.0)
Lymphocytes Relative: 9 %
Lymphs Abs: 0.8 10*3/uL (ref 0.7–4.0)
MCH: 37.3 pg — ABNORMAL HIGH (ref 26.0–34.0)
MCHC: 33.3 g/dL (ref 30.0–36.0)
MCV: 111.8 fL — ABNORMAL HIGH (ref 80.0–100.0)
Monocytes Absolute: 0.6 10*3/uL (ref 0.1–1.0)
Monocytes Relative: 7 %
Neutro Abs: 7 10*3/uL (ref 1.7–7.7)
Neutrophils Relative %: 81 %
Platelets: 336 10*3/uL (ref 150–400)
RBC: 2.2 MIL/uL — ABNORMAL LOW (ref 3.87–5.11)
RDW: 19.4 % — ABNORMAL HIGH (ref 11.5–15.5)
WBC: 8.6 10*3/uL (ref 4.0–10.5)
nRBC: 0 % (ref 0.0–0.2)
nRBC: 0 /100 WBC

## 2022-10-09 LAB — MAGNESIUM: Magnesium: 1.7 mg/dL (ref 1.7–2.4)

## 2022-10-09 LAB — SURGICAL PCR SCREEN
MRSA, PCR: NEGATIVE
Staphylococcus aureus: NEGATIVE

## 2022-10-09 SURGERY — IRRIGATION AND DEBRIDEMENT EXTREMITY
Anesthesia: General | Laterality: Left

## 2022-10-09 MED ORDER — EPHEDRINE 5 MG/ML INJ
INTRAVENOUS | Status: AC
  Filled 2022-10-09: qty 5

## 2022-10-09 MED ORDER — 0.9 % SODIUM CHLORIDE (POUR BTL) OPTIME
TOPICAL | Status: DC | PRN
  Administered 2022-10-09: 500 mL

## 2022-10-09 MED ORDER — SODIUM CHLORIDE 0.9 % IR SOLN
Status: DC | PRN
  Administered 2022-10-09: 3000 mL

## 2022-10-09 MED ORDER — FENTANYL CITRATE (PF) 100 MCG/2ML IJ SOLN
25.0000 ug | INTRAMUSCULAR | Status: DC | PRN
  Administered 2022-10-10 (×2): 25 ug via INTRAVENOUS

## 2022-10-09 MED ORDER — CEFAZOLIN SODIUM-DEXTROSE 2-3 GM-%(50ML) IV SOLR
INTRAVENOUS | Status: DC | PRN
  Administered 2022-10-09: 2 g via INTRAVENOUS

## 2022-10-09 MED ORDER — PHENYLEPHRINE HCL-NACL 20-0.9 MG/250ML-% IV SOLN: INTRAVENOUS | Status: DC | PRN

## 2022-10-09 MED ORDER — EPHEDRINE SULFATE-NACL 50-0.9 MG/10ML-% IV SOSY
PREFILLED_SYRINGE | INTRAVENOUS | Status: DC | PRN
  Administered 2022-10-09: 5 mg via INTRAVENOUS
  Administered 2022-10-09 (×2): 10 mg via INTRAVENOUS

## 2022-10-09 MED ORDER — PROPOFOL 10 MG/ML IV BOLUS
INTRAVENOUS | Status: DC | PRN
  Administered 2022-10-09: 80 mg via INTRAVENOUS

## 2022-10-09 MED ORDER — ROCURONIUM BROMIDE 10 MG/ML (PF) SYRINGE
PREFILLED_SYRINGE | INTRAVENOUS | Status: DC | PRN
  Administered 2022-10-09: 50 mg via INTRAVENOUS

## 2022-10-09 MED ORDER — PROPOFOL 10 MG/ML IV BOLUS
INTRAVENOUS | Status: AC
  Filled 2022-10-09: qty 20

## 2022-10-09 MED ORDER — MUPIROCIN 2 % EX OINT
1.0000 | TOPICAL_OINTMENT | Freq: Two times a day (BID) | CUTANEOUS | Status: AC
  Administered 2022-10-10 – 2022-10-14 (×9): 1 via NASAL
  Filled 2022-10-09: qty 22

## 2022-10-09 MED ORDER — ONDANSETRON HCL 4 MG/2ML IJ SOLN
INTRAMUSCULAR | Status: DC | PRN
  Administered 2022-10-09: 4 mg via INTRAVENOUS

## 2022-10-09 MED ORDER — SODIUM CHLORIDE 0.9 % IV SOLN: INTRAVENOUS | Status: DC

## 2022-10-09 MED ORDER — DEXAMETHASONE SODIUM PHOSPHATE 10 MG/ML IJ SOLN
INTRAMUSCULAR | Status: DC | PRN
  Administered 2022-10-09: 4 mg via INTRAVENOUS

## 2022-10-09 MED ORDER — MAGNESIUM OXIDE -MG SUPPLEMENT 400 (240 MG) MG PO TABS
400.0000 mg | ORAL_TABLET | Freq: Two times a day (BID) | ORAL | Status: DC
  Administered 2022-10-09 – 2022-10-20 (×22): 400 mg via ORAL
  Filled 2022-10-09 (×23): qty 1

## 2022-10-09 MED ORDER — ROCURONIUM BROMIDE 10 MG/ML (PF) SYRINGE
PREFILLED_SYRINGE | INTRAVENOUS | Status: AC
  Filled 2022-10-09: qty 30

## 2022-10-09 MED ORDER — FENTANYL CITRATE (PF) 250 MCG/5ML IJ SOLN
INTRAMUSCULAR | Status: AC
  Filled 2022-10-09: qty 5

## 2022-10-09 MED ORDER — LIDOCAINE 2% (20 MG/ML) 5 ML SYRINGE
INTRAMUSCULAR | Status: AC
  Filled 2022-10-09: qty 15

## 2022-10-09 MED ORDER — FENTANYL CITRATE (PF) 250 MCG/5ML IJ SOLN
INTRAMUSCULAR | Status: DC | PRN
  Administered 2022-10-09 (×3): 50 ug via INTRAVENOUS

## 2022-10-09 MED ORDER — LIDOCAINE 2% (20 MG/ML) 5 ML SYRINGE
INTRAMUSCULAR | Status: DC | PRN
  Administered 2022-10-09: 40 mg via INTRAVENOUS

## 2022-10-09 MED ORDER — LACTATED RINGERS IV SOLN: INTRAVENOUS | Status: DC | PRN

## 2022-10-09 SURGICAL SUPPLY — 36 items
BAG COUNTER SPONGE SURGICOUNT (BAG) ×1 IMPLANT
BAG SPNG CNTER NS LX DISP (BAG) ×1
CNTNR URN SCR LID CUP LEK RST (MISCELLANEOUS) IMPLANT
CONT SPEC 4OZ STRL OR WHT (MISCELLANEOUS) ×6
COVER SURGICAL LIGHT HANDLE (MISCELLANEOUS) ×1 IMPLANT
DRAPE IMP U-DRAPE 54X76 (DRAPES) ×1 IMPLANT
DRSG AQUACEL AG ADV 3.5X10 (GAUZE/BANDAGES/DRESSINGS) IMPLANT
DRSG MEPILEX BORDER 4X8 (GAUZE/BANDAGES/DRESSINGS) IMPLANT
DRSG MEPITEL 4X7.2 (GAUZE/BANDAGES/DRESSINGS) IMPLANT
DURAPREP 26ML APPLICATOR (WOUND CARE) ×1 IMPLANT
ELECT REM PT RETURN 9FT ADLT (ELECTROSURGICAL) ×1
ELECTRODE REM PT RTRN 9FT ADLT (ELECTROSURGICAL) ×1 IMPLANT
GLOVE BIO SURGEON STRL SZ7.5 (GLOVE) IMPLANT
GLOVE BIOGEL PI IND STRL 7.5 (GLOVE) ×1 IMPLANT
GOWN STRL REIN XL XLG (GOWN DISPOSABLE) ×1 IMPLANT
KIT BASIN OR (CUSTOM PROCEDURE TRAY) ×1 IMPLANT
KIT TURNOVER KIT B (KITS) ×1 IMPLANT
MANIFOLD NEPTUNE II (INSTRUMENTS) ×1 IMPLANT
NS IRRIG 1000ML POUR BTL (IV SOLUTION) ×1 IMPLANT
PACK SHOULDER (CUSTOM PROCEDURE TRAY) ×1 IMPLANT
PACK UNIVERSAL I (CUSTOM PROCEDURE TRAY) ×1 IMPLANT
PAD ARMBOARD 7.5X6 YLW CONV (MISCELLANEOUS) ×2 IMPLANT
SET CYSTO W/LG BORE CLAMP LF (SET/KITS/TRAYS/PACK) IMPLANT
STAPLER VISISTAT 35W (STAPLE) IMPLANT
SUT ETHILON 3 0 FSL (SUTURE) IMPLANT
SUT PDS AB 1 CT  36 (SUTURE)
SUT PDS AB 1 CT 36 (SUTURE) ×2 IMPLANT
SUT VIC AB 0 CT1 18XCR BRD 8 (SUTURE) IMPLANT
SUT VIC AB 0 CT1 27 (SUTURE)
SUT VIC AB 0 CT1 27XBRD ANBCTR (SUTURE) IMPLANT
SUT VIC AB 0 CT1 8-18 (SUTURE) ×1
SUT VIC AB 1 CTB1 27 (SUTURE) IMPLANT
SUT VIC AB 2-0 CT1 18 (SUTURE) IMPLANT
SUT VIC AB 2-0 CT1 27 (SUTURE)
SUT VIC AB 2-0 CT1 TAPERPNT 27 (SUTURE) IMPLANT
TOWEL GREEN STERILE (TOWEL DISPOSABLE) ×1 IMPLANT

## 2022-10-09 NOTE — Progress Notes (Signed)
Occupational Therapy Session Note  Patient Details  Name: Anna Weiss MRN: [id number] Date of Birth: June 05, 1925  Today's Date: 10/09/2022 OT Individual Time: 1115-1200 OT Individual Time Calculation (min): 45 min  OT Missed Visit: 1500-1600 60 min due to surgery scheduled at 5 pm and pt requesting missed visit due to fatigue and need for energy conservation   Short Term Goals: Week 2:  OT Short Term Goal 1 (Week 2): STG=LTGs 2/2 LOS  Skilled Therapeutic Interventions/Progress Updates:  1st Session:Pt and dtr both report MD and ortho MD have decided on I&D surgery later this day due to increased L hip wound drainage for management. Pt reports significant fatigue this am and was in bed upon OT arrival. Pt agreeable for toileting re-training to 3 in 1 commode next to bed as pt has been requiring catheterization dt urine retention and only used bed pan much earlier this am. Pt moved from/to supine with mod A especially for L LE management. Sat at EOB with CGA. Sit to and from stand with min A but min-mod A SPT with RW on and off 3 in1. Pt unable to void more than just a few dribbles and when later measured<100 ccs. Mod A for peri hygiene and incontinence brief management as well as pull on garment mngt down and up. Pt left bed level at end of session with bed exit on, dtr bedside, call button for nurse and needs in reach.     2nd Session: missed therapy- see notes     Therapy Documentation Precautions:  Precautions Precautions: Fall, Other (comment) Precaution Comments: monitor O2, orthostatics Restrictions Weight Bearing Restrictions: Yes LLE Weight Bearing: Weight bearing as tolerated    Therapy/Group: Individual Therapy  [name] 10/09/2022, 3:11 PM

## 2022-10-09 NOTE — Progress Notes (Signed)
Orthopedic Surgery Post-operative Check Note   Assessment: Patient is a 88 y.o. female with left intertrochanteric femur fracture status post IMN, developed persistent drainage and now s/p I&D   Plan: -Operative plans: complete -Will follow intra-operative cultures -Diet: regular -DVT ppx: xarelto -Antibiotics: ancef x2 post-op doses -Weight bearing status: as tolerated -PT/OT evaluate and treat -Pain control -Dispo: return to the floor  ___________________________________________________________________________  Subjective: No acute events since surgery. Recovering from anesthesia. Still drowsy so no further subjective.   Physical Exam:  General: drowsy, laying in bed Respiratory: unlabored breathing on supplemental O2   MSK:   -Left lower extremity  Dressings c/d/i EHL/TA/GSC intact Responds to stimuli in s/s/dp/sp/t nerve distributions Foot warm and well perfused   Patient name: Anna Weiss Patient MRN: [id number] Date: 10/10/22

## 2022-10-09 NOTE — Brief Op Note (Signed)
10/09/2022  11:50 PM  PATIENT:  Anna Weiss  88 y.o. female  PRE-OPERATIVE DIAGNOSIS:  Left Hip Infection  POST-OPERATIVE DIAGNOSIS:  Left Hip Infection  PROCEDURE:  Procedure(s): IRRIGATION AND DEBRIDEMENT LEFT HIP (Left)  SURGEON:  Surgeon(s) and Role:    Callie Fielding, MD - Primary  PHYSICIAN ASSISTANT:   ASSISTANTS: none   ANESTHESIA:   general  EBL:  50 mL   BLOOD ADMINISTERED:none  DRAINS: none   LOCAL MEDICATIONS USED:  NONE  SPECIMEN: multiple - 3 from the proximal incision, 2 from the distal incision, 1 from the middle incision  DISPOSITION OF SPECIMEN:   microbiology  COUNTS:  YES  TOURNIQUET:  * No tourniquets in log *  DICTATION: .Note written in EPIC  PLAN OF CARE: Admit to inpatient   PATIENT DISPOSITION:  PACU - hemodynamically stable.   Delay start of Pharmacological VTE agent (>24hrs) due to surgical blood loss or risk of bleeding: no

## 2022-10-09 NOTE — Progress Notes (Signed)
PROGRESS NOTE   Subjective/Complaints:  Pt reports still has some "light head' feeling.  Nauseated again. Required in/out cath x 23 times due to urinary retention.   Still having serous/yellow drainage from hip- is worse per nursing.  Also had numb spot son arms yesterday for ~ 1 minute- resolved and hasn't recurred.    ROS:   Pt denies SOB, abd pain, CP, N/V/C/D, and vision changes  Except per HPI   Objective:   DG FEMUR 1V LEFT  Result Date: 10/09/2022 CLINICAL DATA:  Status post surgical internal fixation of left proximal femur fracture. EXAM: LEFT FEMUR 1 VIEW COMPARISON:  Same day. FINDINGS: Status post surgical internal fixation of proximal left femoral fracture. There appears to be grossly stable alignment of the fracture components given the differences in obliquity on these projections. No new fracture or dislocation is noted. IMPRESSION: Status post surgical internal fixation of proximal left femoral fracture. Electronically Signed   By: Marijo Conception M.D.   On: 10/09/2022 08:20   DG FEMUR MIN 2 VIEWS LEFT  Result Date: 10/09/2022 CLINICAL DATA:  [id number] with left rib fracture postoperative. EXAM: LEFT FEMUR 2 VIEWS COMPARISON:  Last postoperative study was 09/26/2022 FINDINGS: Dynamic compression screw fixation through a short intramedullary femoral nail is again noted of the previously described intertrochanteric fracture. The lesser trochanter again noted medially displaced and separately fractured. On the third image the main distal fracture fragment displaced from the proximal fragment up to 1 cm. No callus formation is yet seen. There is no new or worsening alignment abnormality. No new fracture. Skin staples again are noted overlying the lateral hip region. There is osteopenia. 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 Femoral artery calcific plaques. IMPRESSION: 1. Dynamic compression screw fixation of the  intertrochanteric fracture of the proximal left femur. No new or worsening alignment abnormality. No callus formation yet seen. 2. Osteopenia. 3. Atherosclerosis. Electronically Signed   By: Telford Nab M.D.   On: 10/09/2022 06:17   No results for input(s): "WBC", "HGB", "HCT", "PLT" in the last 72 hours.  Recent Labs    10/08/22 0638  NA 134*  K 4.2  CL 94*  CO2 29  GLUCOSE 110*  BUN <5*  CREATININE 0.64  CALCIUM 9.0    Intake/Output Summary (Last 24 hours) at 10/09/2022 0911 Last data filed at 10/09/2022 0700 Gross per 24 hour  Intake 220 ml  Output 1600 ml  Net -1380 ml        Physical Exam: Vital Signs Blood pressure (!) 156/47, pulse 63, temperature 98.2 F (36.8 C), temperature source Oral, resp. rate 15, height '5\' 7"'$  (1.702 m), weight 72.7 kg, SpO2 90 %.     General: awake, alert, appropriate, looks more ill this AM; c/o nausea; sitting up in bed; won't eat breakfast; NAD HENT: conjugate gaze; oropharynx moist CV: regular rate; no JVD Pulmonary: CTA B/L; no W/R/R- good air movement GI: soft, NT, ND, (+)BS Psychiatric: appropriate- more quiet Neurological: Ox3 Skin:- more yellow drainage- soaked abd pad- which is new- worse-  Musculoskeletal:     Cervical back: Normal range of motion.     Comments: Left hip tender, still swollen - bruised- mainly yellow-  much better- foam dressing over L hip C/D/I Skin: L hip less swelling; dressing C/D/I    General: Skin is warm.     Findings: Bruising (arms) present.     Comments: Left hip incision CDI with dry dressing- no drainage through dressing- eating so could not unwrap. . Left ear as above. L ear we've removed bolster- still really bruised  Neurological:     Mental Status: She is alert and oriented to person, place, and time.     Cranial Nerves: No cranial nerve deficit.     Sensory: No sensory deficit.     Comments: Motor: 4-5/5 UE's. RLE 3/5 prox to 5/5 distal. LLE 1-2/5 (pain) to 4/5 distally. DTR's 1+, no  abnl tone or ataxia   Assessment/Plan: 1. Functional deficits which require 3+ hours per day of interdisciplinary therapy in a comprehensive inpatient rehab setting. Physiatrist is providing close team supervision and 24 hour management of active medical problems listed below. Physiatrist and rehab team continue to assess barriers to discharge/monitor patient progress toward functional and medical goals  Care Tool:  Bathing    Body parts bathed by patient: Right arm, Left arm, Chest, Abdomen, Front perineal area, Right upper leg, Face, Left upper leg   Body parts bathed by helper: Left lower leg, Right lower leg, Buttocks     Bathing assist Assist Level: Moderate Assistance - Patient 50 - 74%     Upper Body Dressing/Undressing Upper body dressing   What is the patient wearing?: Pull over shirt    Upper body assist Assist Level: Set up assist    Lower Body Dressing/Undressing Lower body dressing      What is the patient wearing?: Incontinence brief, Pants     Lower body assist Assist for lower body dressing: Minimal Assistance - Patient > 75% (use of reacher and RW)     Toileting Toileting    Toileting assist Assist for toileting: Minimal Assistance - Patient > 75% (use of RW for standing balance)     Transfers Chair/bed transfer  Transfers assist  Chair/bed transfer activity did not occur: Safety/medical concerns (dizziness, pain, decreased balance)  Chair/bed transfer assist level: Minimal Assistance - Patient > 75%     Locomotion Ambulation   Ambulation assist   Ambulation activity did not occur: Safety/medical concerns (dizziness, pain, decreased balance)  Assist level: Minimal Assistance - Patient > 75% Assistive device: Walker-rolling Max distance: 12   Walk 10 feet activity   Assist  Walk 10 feet activity did not occur: Safety/medical concerns (dizziness, pain, decreased balance)  Assist level: Minimal Assistance - Patient > 75% Assistive  device: Walker-rolling   Walk 50 feet activity   Assist Walk 50 feet with 2 turns activity did not occur: Safety/medical concerns         Walk 150 feet activity   Assist Walk 150 feet activity did not occur: Safety/medical concerns         Walk 10 feet on uneven surface  activity   Assist Walk 10 feet on uneven surfaces activity did not occur: Safety/medical concerns (dizziness, pain, decreased balance)         Wheelchair     Assist Is the patient using a wheelchair?: Yes Type of Wheelchair: Manual Wheelchair activity did not occur: Safety/medical concerns (dizziness, pain, decreased balance)         Wheelchair 50 feet with 2 turns activity    Assist    Wheelchair 50 feet with 2 turns activity did not occur: Safety/medical concerns (  dizziness, pain, decreased balance)       Wheelchair 150 feet activity     Assist  Wheelchair 150 feet activity did not occur: Safety/medical concerns (dizziness, pain, decreased balance)       Blood pressure (!) 156/47, pulse 63, temperature 98.2 F (36.8 C), temperature source Oral, resp. rate 15, height '5\' 7"'$  (1.702 m), weight 72.7 kg, SpO2 90 %.  Medical Problem List and Plan: 1. Functional deficits secondary to left intertrochanteric femur fx after fall. Pt s/p IMN 09/25/22             -LLE WBAT             -patient may shower             -ELOS/Goals: 15-20 days  D/c set for 11/21  Con't CIR- PT, OT- going for wash out today/this evening- and will make NPO- meds with sips 2.  Antithrombotics: -DVT/anticoagulation:  xarelto '10mg'$  daily for 14 days             -antiplatelet therapy:  resume plavix '75mg'$  daily 3. Pain Management: tylenol and hydrocodone prn for pain             -robaxin prn for muscle spasms  11/8- pain limiting therapy- doesn't want opiates- mainly using tylenol-   11/9- d/w pt- she's willing to try tramadol 50 mg q6 hours prn- she's scared of Coding if takes Norco- I explained that Tramadol  is mild and shouldn't cause any cognitive issues.   11/10- pain much better- not resolved, but better with tramadol- con't tramadol and tylenol prn  11/14- pain still doing better with tramadol 4. Mood/Behavior/Sleep: team to provide ego support             -antipsychotic agents: n/a 5. Neuropsych/cognition: This patient is capable of making decisions on her own behalf. 6. Skin/Wound Care: dry dressings to left ear and hip  11/9- having some drainage- from L hip- is improved this Am- will monitor closely.  7. Fluids/Electrolytes/Nutrition: encourage appropriate PO             -pt's intake has inconsistent so far 8. Nausea:              -might be positional or med related.              -also having associated gas             -pt told me she's been moving bowels, however I don't see a bm recorded             -will try sorbitol today, dulcolax suppository prn             -protonix 11/8- has ileus- per #15 below  11/9- still having nausea- will change diet to clear liquid per pt request- and monitor  11/10- on clear liquid diet- pt wants to wait 1 more day before changing to regular diet- if not better tomorrow, will need to call GI  11/14- nausea resolved- back on regular diet  11/16- nauseated again 9. Left lower lobe consolidation             -pneumonia ruled out. Likely atelectasis             -IS, OOB             -wean oxygen to off  11/7- will write order to wean O2.  10. Sinus bradycardia:             -on  propranolol and verapamil at home             -continue to hold as HR in 50's  11/14- since still in 50s, will not restart  11. Essential HTN             -continue hctz  11/14- BP usually controlled but up today- will monitor for trend  11/15- somewhat elevated, but not sure if dropping- see #21           11/16- started hydralazine Vitals:   10/08/22 1952 10/09/22 0607  BP: (!) 138/46 (!) 156/47  Pulse: (!) 56 63  Resp: 15 15  Temp: 97.7 F (36.5 C) 98.2 F (36.8 C)   SpO2: 94% 90%    12. Right middle lobe nodule 2.3 cm- should be considered primary lung CA until ruled out- also has nodule as well 4m on R middle lung- could be mets per CT scan.              -f/u as outpt  11/7- d/w daughter- she said no f/u at this time- due to pt's age.   11/8- daughter doesn't want to let pt know about tumors.   13. Mild leukocytosis- 11.2   11/6- afebrile- other than difficulty swallowing- says it's dry?; denies UTI Sx's- if gets worse clinically, will recheck in AM- right now scheduled for Wed AM.   11/8- WBC down to 9.4-  14. Constipation  11/6- LBM on day of fall per pt and chart- will give Sorbitol after therapy and follow with SSE if needed.   11/7- had multiple BM's- didn't need SSE- per notes- feels cleaned out  15. ABLA  11/6- Down to 7.6- will recheck Wednesday to make sure not dropping further- and transfuse if required.   11/8- Hb 7.3- on edge of needing transfusion.   11/9- Hb up to 8.0-   11/10- Hb 7.7- will con't to monitor closely.   11/15- Hb 8.4 16. Post op ileus resolved BM x 3 11/10, 1 BM 11/12- advance diet as tolerated  17. Thrush?  11/9- will add Magic mouthwash- don't see plaques, but tongue dry and furrowed. Says things taste 'wierd".   11/10- said magic mouthwash helping Sx's.  18. Leukocytosis improved -   11/15- WBC down to 7.8  11/16- will recheck today to make sure not elevated    Latest Ref Rng & Units 10/06/2022    5:20 AM 10/03/2022    5:26 AM 10/02/2022    6:14 AM  CBC  WBC 4.0 - 10.5 K/uL 7.8  10.0  12.6   Hemoglobin 12.0 - 15.0 g/dL 8.4  7.7  8.0   Hematocrit 36.0 - 46.0 % 24.1  23.0  22.8   Platelets 150 - 400 K/uL 346  320  310     19. Hypokalemia and low Mg  11/10- repleted up to 3.5- will put on Kcl 10 mEq daily for now and recheck Monday- since K+ still 3.5- will also check Mg Monday as well   11/14= Mg 1.6 and K+ 3.1- repleted again  yesterday by pharmacy for [place] will recheck labs iN AM  11/15- K+ 4.2- will  give IV Mg 2G for Mg 1.6- and recheck in AM  11/16- Mg 1.7- still low- will put on PO Mg 400 mg BID 20. Swallowing issues  11/14- appears to be thrush- will start Diflucan- due to renal issues, will need to be 50 mg daily- x 7 days doses-   11/15- reports things taste  better and was able ot swallow some this AM of breakfast-con't diflucan 21. Dizziness/lightheadedness/presyncope  11/15- will have pt get orthostatics checked by nursing and therapy- BP on high side, so not sure if Sx's due to BP being high or dropping.   11/16- added hydralazine 25 mg TID- is somewhat better today 22. Infection of L hip?  11/16- still having drainage from L hip- spoke to Ortho- they are taking her to OR today at 5pm for wash out-   23. Urinary retention  11/16- is new- will check U/A and Cx- and labs- CBC- with diff and CMP just to make sure no other issues   I spent a total of 58   minutes on total care today- >50% coordination of care- due to d/w Ortho, with nursing and with PA  LOS: 11 days A FACE TO FACE EVALUATION WAS PERFORMED  Mario Voong 10/09/2022, 9:11 AM

## 2022-10-09 NOTE — Progress Notes (Signed)
Patient continues to have difficulty swallowing pills and vomited most back up this morning. I believe it is the psychology aspect vs swallowing difficulty. PA notified

## 2022-10-09 NOTE — Anesthesia Preprocedure Evaluation (Addendum)
Anesthesia Evaluation  Patient identified by MRN, date of birth, ID band Patient awake    Reviewed: Allergy & Precautions, NPO status , Patient's Chart, lab work & pertinent test results  History of Anesthesia Complications Negative for: history of anesthetic complications  Airway Mallampati: III  TM Distance: >3 FB Neck ROM: Full    Dental  (+) Dental Advisory Given, Partial Lower, Partial Upper   Pulmonary neg pulmonary ROS   breath sounds clear to auscultation       Cardiovascular hypertension, Pt. on medications (-) angina  Rhythm:Regular Rate:Normal  09/25/2022 ECHO:  EF 60-65% normal LVF, mild LVH, mildly reduced RVF, mild MR   Neuro/Psych CVA, No Residual Symptoms  negative psych ROS   GI/Hepatic Neg liver ROS,GERD  Medicated and Controlled,,  Endo/Other  Hypothyroidism    Renal/GU negative Renal ROS     Musculoskeletal   Abdominal   Peds  Hematology  (+) anemia Xarelto (last dose yesterday), plavix   Anesthesia Other Findings   Reproductive/Obstetrics                             Anesthesia Physical Anesthesia Plan  ASA: 4  Anesthesia Plan: General   Post-op Pain Management: Ofirmev IV (intra-op)*   Induction: Intravenous  PONV Risk Score and Plan: 3 and Ondansetron, Dexamethasone and Treatment may vary due to age or medical condition  Airway Management Planned: Oral ETT and Video Laryngoscope Planned  Additional Equipment: None  Intra-op Plan:   Post-operative Plan: Extubation in OR  Informed Consent: I have reviewed the patients History and Physical, chart, labs and discussed the procedure including the risks, benefits and alternatives for the proposed anesthesia with the patient or authorized representative who has indicated his/her understanding and acceptance.   Patient has DNR.  Discussed DNR with patient, Discussed DNR with power of attorney and Suspend DNR.    Dental advisory given and Consent reviewed with POA  Plan Discussed with: CRNA and Surgeon  Anesthesia Plan Comments:        Anesthesia Quick Evaluation

## 2022-10-09 NOTE — Progress Notes (Addendum)
Orthopedic Surgery Progress Note   Assessment: Patient is a 88 y.o. female with left intertrochanteric femur fracture status post IMN, now with persistent drainage   Plan: -Planning for I&D tonight -Diet: NPO for procedure -DVT ppx: per primary -Antibiotics: hold until cultures taken -Weight bearing status: weight bearing as tolerated left lower extremity -PT/OT evaluate and treat -Pain control -Dispo: pending completion of operative plans and cultures  Talked to with her this morning about how drainage at 2 weeks is abnormal and is concerning for infection. I recommended an I&D to the left hip to address a possible infection. The benefits were explained which included obtaining cultures, helping clear an infection, and hopefully helping the wound heal. The risks included but were not limited to persistent drainage, wound dehiscence, and the risks of anesthesia (death, MI, blood clot, respiratory failure). The alternative would be to continue to monitor or treat with antibiotics which I do no think would clear the infection effectively. After this discussion, patient's questions were answered to her satisfaction. Patient elected to proceed with surgery. Will plan for I&D this afternoon.   ___________________________________________________________________________  Subjective: No acute events overnight. Hip pain has slowly been getting better. Has not felt feverish, but feels tired. Denies paresthesias and numbness.    Physical Exam:  General: no acute distress, appears stated age, laying in bed Neurologic: alert, answering questions appropriately, following commands Respiratory: unlabored breathing, symmetric chest rise Psychiatric: appropriate affect, normal cadence to speech  MSK:   -Left lower extremity  Proximal dressing with drainage (dressing was replaced last night), has yellow and greenish hue, no bloody drainage, no active or expressible drainage, no erythema around the  incisions  Resolving ecchymosis around the hip Fires quadriceps, hamstrings, tibialis anterior, gastrocnemius and soleus, extensor hallucis longus Plantarflexes and dorsiflexes toes Sensation intact to light touch in sural, saphenous, tibial, deep peroneal, and superficial peroneal nerve distributions Foot warm and well perfused, palpable DP pulse   Patient name: Anna Weiss Patient MRN: [id number] Date: 10/09/22

## 2022-10-09 NOTE — Anesthesia Procedure Notes (Signed)
Procedure Name: Intubation Date/Time: 10/09/2022 10:14 PM  Performed by: Janace Litten, CRNAPre-anesthesia Checklist: Patient identified, Emergency Drugs available, Suction available and Patient being monitored Patient Re-evaluated:Patient Re-evaluated prior to induction Oxygen Delivery Method: Circle System Utilized Preoxygenation: Pre-oxygenation with 100% oxygen Induction Type: IV induction Ventilation: Mask ventilation without difficulty Laryngoscope Size: Glidescope and 3 Grade View: Grade I Tube type: Oral Tube size: 7.0 mm Number of attempts: 1 Airway Equipment and Method: Stylet and Video-laryngoscopy Placement Confirmation: ETT inserted through vocal cords under direct vision, positive ETCO2 and breath sounds checked- equal and bilateral Tube secured with: Tape Dental Injury: Teeth and Oropharynx as per pre-operative assessment  Difficulty Due To: Difficulty was anticipated, Difficult Airway- due to anterior larynx and Difficult Airway- due to reduced neck mobility Comments: Elective glidescope d/t airway indicies and previous history of glidescope use.

## 2022-10-09 NOTE — Progress Notes (Signed)
Physical Therapy Session Note  Patient Details  Name: Anna Weiss MRN: [id number] Date of Birth: 1925/02/16  Today's Date: 10/09/2022   Short Term Goals: Week 1:  PT Short Term Goal 1 (Week 1): pt will transfer supine<>sitting EOB with min A consistantly PT Short Term Goal 1 - Progress (Week 1): Met PT Short Term Goal 2 (Week 1): pt will transfer sit<>stand with LRAD and mod A PT Short Term Goal 2 - Progress (Week 1): Met PT Short Term Goal 3 (Week 1): pt will transfer bed<>WC with LRAD and mod A PT Short Term Goal 3 - Progress (Week 1): Met Week 2:  PT Short Term Goal 1 (Week 2): STG=LTG due to ELOS   Skilled Therapeutic Interventions/Progress Updates:   Pt received supine in bed. Pt has been NPO since this Am with I&D scheduled for this evening. Pt declines any therapy at this time due to fear of pain and fatigue. Left supine in bed with daughter present and all needs met.       Therapy Documentation Precautions:  Precautions Precautions: Fall, Other (comment) Precaution Comments: monitor O2, orthostatics Restrictions Weight Bearing Restrictions: Yes LLE Weight Bearing: Weight bearing as tolerated General: PT Amount of Missed Time (min): 30 Minutes PT Missed Treatment Reason: Patient fatigue. Refused   Pain: Pain Assessment Pain Scale: 0-10 Pain Score: 4  Pain Type: Surgical pain Pain Location: Hip Pain Orientation: Left Pain Descriptors / Indicators: Sharp Pain Onset: With Activity Patients Stated Pain Goal: 2 Pain Intervention(s): Medication (See eMAR)   Therapy/Group: Individual Therapy  [name] 10/09/2022, 1:38 PM

## 2022-10-09 NOTE — Progress Notes (Signed)
Pt seen earlier AM and now has surgery scheduled for 5 pm. Requested no OT d/6t need for energy conservation based on advanced age and fatigue this day.

## 2022-10-09 NOTE — Progress Notes (Signed)
Physical Therapy Session Note  Patient Details  Name: Anna Weiss MRN: [id number] Date of Birth: 07/24/1925  Today's Date: 10/09/2022 PT Individual Time: 0900-0927 PT Individual Time Calculation (min): 27 min   Short Term Goals: Week 2:  PT Short Term Goal 1 (Week 2): STG=LTG due to ELOS  Skilled Therapeutic Interventions/Progress Updates:      Therapy Documentation Precautions:  Precautions Precautions: Fall, Other (comment) Precaution Comments: monitor O2, orthostatics Restrictions Weight Bearing Restrictions: Yes LLE Weight Bearing: Weight bearing as tolerated  Pt received semi-reclined in bed with nursing present for med pass. Pt agreeable to vestibular evaluation as pt with onset of dizziness ~1 week prior. Pt describes dizziness as feeling shaky, lightheaded and heavy headed, blurry vision, nausea, and head spinning. Pt reports symptoms are aggravated with turning in w/c, rolling, looking up and down, turning head, reaching up and bending down. Pt cervical ROM within normal limits. Pt reports fall in March of 2023 in which she hit her head and recently had fall leading to pt's admission.   PT assessed for vertebro-basilar insufficiency and pt with positive test of left neck rotation and extension. Pt reports feeling pre-syncopal, nausea, and dizziness with assessment. PT deferred further vestibular assessment and pt returned to bed and left with all needs in reach and bed alarm on. PA and MD notified regarding evaluation.   Pt required min A for bed mobility and was able to bridge and perform lower body dressing with close supervision.    Therapy/Group: Individual Therapy  [name] [name] PT, DPT  10/09/2022, 7:47 AM

## 2022-10-09 NOTE — Progress Notes (Addendum)
Pt is currently in transport to the Anesthesia unit. Report was given to [facility].   2150. Notified Dr. Silvestre Mesi on pt gone to surgery. He is aware that pt is gone for an I&D and will return back to the floor.

## 2022-10-10 ENCOUNTER — Encounter (HOSPITAL_COMMUNITY): Payer: Self-pay | Admitting: Orthopedic Surgery

## 2022-10-10 DIAGNOSIS — L24A9 Irritant contact dermatitis due friction or contact with other specified body fluids: Secondary | ICD-10-CM

## 2022-10-10 LAB — URINE CULTURE: Culture: <10000 — AB

## 2022-10-10 MED ORDER — BLISTEX MEDICATED EX OINT: TOPICAL_OINTMENT | CUTANEOUS | Status: DC | PRN

## 2022-10-10 MED ORDER — SODIUM CHLORIDE 0.9 % IV SOLN: INTRAVENOUS | Status: AC

## 2022-10-10 MED ORDER — TAMSULOSIN HCL 0.4 MG PO CAPS
0.4000 mg | ORAL_CAPSULE | Freq: Every day | ORAL | Status: DC
  Administered 2022-10-10 – 2022-10-15 (×6): 0.4 mg via ORAL
  Filled 2022-10-10 (×6): qty 1

## 2022-10-10 MED ORDER — CEFAZOLIN SODIUM-DEXTROSE 2-4 GM/100ML-% IV SOLN
2.0000 g | Freq: Three times a day (TID) | INTRAVENOUS | Status: AC
  Administered 2022-10-10 (×2): 2 g via INTRAVENOUS
  Filled 2022-10-10 (×2): qty 100

## 2022-10-10 MED ORDER — LIP MEDEX EX OINT
TOPICAL_OINTMENT | CUTANEOUS | Status: DC | PRN
  Administered 2022-10-22: 75 via TOPICAL
  Filled 2022-10-10: qty 7

## 2022-10-10 MED ORDER — FENTANYL CITRATE (PF) 100 MCG/2ML IJ SOLN
INTRAMUSCULAR | Status: AC
  Filled 2022-10-10: qty 2

## 2022-10-10 MED ORDER — SUGAMMADEX SODIUM 200 MG/2ML IV SOLN
INTRAVENOUS | Status: DC | PRN
  Administered 2022-10-09: 150 mg via INTRAVENOUS

## 2022-10-10 NOTE — Transfer of Care (Signed)
2Immediate Anesthesia Transfer of Care Note  Patient: Anna Weiss  Procedure(s) Performed: IRRIGATION AND DEBRIDEMENT LEFT HIP (Left)  Patient Location: PACU  Anesthesia Type:General  Level of Consciousness: awake, alert , and oriented  Airway & Oxygen Therapy: Patient Spontanous Breathing and Patient connected to face mask oxygen  Post-op Assessment: Report given to RN, Post -op Vital signs reviewed and stable, and Patient moving all extremities  Post vital signs: Reviewed and stable  Last Vitals:  Vitals Value Taken Time  BP 225/192 10/10/22 0003  Temp    Pulse 67 10/10/22 0004  Resp 19 10/10/22 0004  SpO2 100 % 10/10/22 0004  Vitals shown include unvalidated device data.  Last Pain:  Vitals:   10/09/22 2125  TempSrc:   PainSc: 0-No pain      Patients Stated Pain Goal: 2 (04/03/01 1117)  Complications:  Encounter Notable Events  Notable Event Outcome Phase Comment  Difficult to intubate - expected  Intraprocedure Filed from anesthesia note documentation.

## 2022-10-10 NOTE — Progress Notes (Addendum)
Patient ID: Anna Weiss, female   DOB: 1925-08-23, 88 y.o.   MRN: [id number]  Met with pt to check on her and see how she is feeling. She reports somewhat better slept some and glad no infection found. Hopes her bladder will wake up due to having to be I & O cath  1:30 PM Met with daughter who is here who expressed concerns regarding if Mom will be ready to go home on Tuesday. She feels she will need a few extra days since had to have surgery last night and today is difficult for her. Will await team's recommendations  2:30 PM Team will re-conference on Tuesday and decide on new discharge date.

## 2022-10-10 NOTE — Progress Notes (Signed)
Physical Therapy Session Note  Patient Details  Name: Anna Weiss MRN: [id number] Date of Birth: Oct 28, 1925  Today's Date: 10/10/2022 PT Missed Time: 45 Minutes Missed Time Reason: Patient fatigue  Short Term Goals: Week 2:  PT Short Term Goal 1 (Week 2): STG=LTG due to ELOS  Skilled Therapeutic Interventions/Progress Updates:      1st session: Pt in bed on arrival - awake but tired. Pt reports high levels of fatigue from last night's procedure. Reports she had barely any sleep. Politely requests to defer PT session to rest. Informed her of scheduled family ed/training in PM with son/daughter. Encouraged her to rest to conserve energy for PM session. Pt in agreement. Pt quickly falling asleep when PT left room. Pt missed 60 minutes of therapy 2/2 fatigue.    2nd session: ([id number]) Direct handoff of care from OT to start with patient sitting on commode. Student nurse also at bedside who assisted PT in donning brief in standing. Pt requiring minA to stand from [facility] to RW - cues needed for hand placement. Pt tolerating standing with CGA for safety while completing pericare and pulling pants over hips. Poor controlled lowering to sit, minA needed.   Patient's daughter who will be primary caregiver, is also in the room. Daughter reports she's a retired Psychologist, sport and exercise so she has some background. Daughter reports hesitation regarding DC plan as she feels patient will not be medically ready by original DC date of Tuesday 11/21. Primary care team is aware of concerns.   Patient politely refuses functional mobility as she's fearful of movement due to still feeling drowsy from her procedure and lack of sleep. Encouraged her to stay sitting up in w/c at end of session which she was agreeable to.   Spent remainder of session on verbal education and DC planning - confirm 1 lvl home with 1 + 1 (non consecutive) steps to enter home. Reports bathroom has been made handicap accessible with elevated  toilet and grab bars installed. Reports they have DME (RW, shower chair, and w/c) as their son works for [employer].   Pt remained seated in w/c at end of session with daughter at bedside. All needs met. Missed 10 minutes of therapy 2/2 fatigue.     Therapy Documentation Precautions:  Precautions Precautions: Fall, Other (comment) Precaution Comments: monitor O2, orthostatics Restrictions Weight Bearing Restrictions: Yes LLE Weight Bearing: Weight bearing as tolerated General:     Therapy/Group: Individual Therapy  Alger Simons 10/10/2022, 7:36 AM

## 2022-10-10 NOTE — Progress Notes (Signed)
Occupational Therapy Session Note  Patient Details  Name: Anna Weiss MRN: [id number] Date of Birth: Jun 09, 1925  Today's Date: 10/10/2022 OT Individual Time: 800-900 1st Session; 1300-1345 2nd Session  OT Individual Time Calculation (min):60 min,  45 min    Short Term Goals: Week 2:  OT Short Term Goal 1 (Week 2): STG=LTGs 2/2 LOS  Skilled Therapeutic Interventions/Progress Updates:   1st Session:  Pt in bed asleep dozing in and out upon OT arrival. Pt just had I&D surgery for L hip late last night. Appreciate surgery notes post-op and this am as pt deemed stable for continued OT. Pt now on 1 ltrs O2 via [place] and SpO2 94%. Pt awakened and agreeable to light AM self care retraining and self feeding activity bed level. Pt required rests between all tasks d/t limited overall activity tolerance but pleasant, verbal and conversationally appropriate as well as oriented to time, place and situation. Pt was able to complete face washing, oral care including dentures and self feeding juice and light meal all with set up bed level with HOB elevated. Denied need for toileting when offered. Care coord with PT re: status with plan for further mobilization and activity progression later visits this day. Repositioned B LE's for skin and joint protection with heels floated. Pt reported no pain throughout session and VSR: HR 74, BP 129/62, RR 16. Bed exit alarm set, needs and nurse call button left within reach at end of OT session.   2nd Session:  Pt having bladder scan upon OT arrival. Pt agreeable to OOB to 3 in 1 and to recliner with simple self care of sponge bathing, UB/LB dressing and grooming on commode. Pt able to move from supine to sit at EOB with max A for L LE management. EOB with CGA. Bed to DABSC to L side with lateral scoot transfer and mod A. Set up for UB bathing, grooming and max A with reacher training with pants donning and push up for peri and buttocks hygiene. Dtr present for [facility]  and care coord with team as pt may not be ready for d/c Tues next week. MSW came down and spoke with dtr and OT worked with nursing student who obtained seated OOB VSR: BP 140/52 and SpO2 94 % with 1 ltrs of O2 via [place]. Pain reported at 4/10 L hip region with activity only. PT arrived and handoff for next transfer DABSC to recliner.   Therapy Documentation Precautions:  Precautions Precautions: Fall, Other (comment) Precaution Comments: monitor O2, orthostatics Restrictions Weight Bearing Restrictions: Yes LLE Weight Bearing: Weight bearing as tolerated   Therapy/Group: Individual Therapy  [name] 10/10/2022, 7:53 AM

## 2022-10-10 NOTE — Progress Notes (Signed)
Orthopedic Surgery Post-operative Check Note   Assessment: Patient is a 88 y.o. female with left intertrochanteric femur fracture status post IMN, developed persistent drainage and now s/p I&D   Plan: -Operative plans: complete -Keep dressing dry and in place at all times -Will follow intra-operative cultures (no organism thus far) -Diet: regular -DVT ppx: xarelto -Antibiotics: ancef x2 post-op doses -Weight bearing status: as tolerated -PT/OT evaluate and treat -Pain control -Dispo: remain floor status  ___________________________________________________________________________  Subjective: No acute events since surgery. Moved back to the floor after surgery. Pain in hip is adequately controlled. Denies paresthesias and numbness. Feels tired and wants to go back to sleep.    Physical Exam:  General: no acute distress, laying in bed, appears stated age Respiratory: unlabored breathing, symmetric chest rise Neurologic: sleeping but awakes to voice, alert, orientated, following commands   MSK:   -Left lower extremity  Dressings with small amount of blood otherwise c/d/i EHL/TA/GSC intact Sensation intact to light touch in sural/saphenous/deep peroneal/superficial peroneal/tibial nerve distributions Foot warm and well perfused   Patient name: Anna Weiss Patient MRN: [id number] Date: 10/10/22

## 2022-10-10 NOTE — Progress Notes (Signed)
Occupational Therapy Session Note  Patient Details  Name: Anna Weiss MRN: [id number] Date of Birth: [birth year]  Today's Date: 10/10/2022 OT Individual Time: 1530-1640 OT Individual Time Calculation (min): 70 min    Short Term Goals: Week 2:  OT Short Term Goal 1 (Week 2): STG=LTGs 2/2 LOS  Skilled Therapeutic Interventions/Progress Updates:    Upon OT arrival, pt semi recumbent in bed reporting "I just got back into bed". Educated pt on missed therapy time and requires encouragement to engage in OT but was agreeable to OT treatment. Treatment intervention with a focus on self care retraining, functional mobility, strengthening, and endurance. Pt donns LB clothing with Max A bed level and EOB with RW and completes stand step transfer into w/c with Min A. Pt was transported to main therapy gym via w/c and total A and sits at tabletop to place push pins into cork board at a slanted upright position following moderately complex graphic design using B UE and 1lb wrist weights donned. Pt works at a slow and steady pace requiring significant increased time to replicate. Rest break required once replicated. Cork board was removed from slanted position and placed flat on tabletop for pt to remove push pins using the L UE only one at a time secondary to greater weakness in the L UE compared to the R UE. Pt with min difficulty and requires one rest break to remove all push pins. Pt was transported back to her room and reports the need to use the bathroom. Pt completes stand step transfer to bathroom with CGA and performs toileting with CGA with RW. Pt's pants doffed with total A for time and pt ambulates back to bed with CGA. Pt's shoes were doffed for time and pt completes sit to supine transfer with Mod A. Pt was left in bed at end of session with all needs met and safety measures in place. Pt's daughter at bedside.   Therapy Documentation Precautions:  Precautions Precautions: Fall, Other  (comment) Precaution Comments: monitor O2, orthostatics Restrictions Weight Bearing Restrictions: Yes LLE Weight Bearing: Weight bearing as tolerated   Therapy/Group: Individual Therapy  Marvetta Gibbons 10/10/2022, 5:08 PM

## 2022-10-10 NOTE — Progress Notes (Signed)
PROGRESS NOTE   Subjective/Complaints:  Pt reports feeling sleepy and bad- had surgery last night- came back at 1am! Doesn't want pain meds- scared of side effects- wants to continue Tramadol.   Said lips dry and peeling-  Still retaining- no feeling of needing to pee- no urge.  Has required in/out caths.   U/A was negative and hip Cx gram stain shows rare to no WBCs.    ROS:  Pt denies SOB, abd pain, CP, N/V/C/D, and vision changes  Except per HPI   Objective:   DG FEMUR 1V LEFT  Result Date: 10/09/2022 CLINICAL DATA:  Status post surgical internal fixation of left proximal femur fracture. EXAM: LEFT FEMUR 1 VIEW COMPARISON:  Same day. FINDINGS: Status post surgical internal fixation of proximal left femoral fracture. There appears to be grossly stable alignment of the fracture components given the differences in obliquity on these projections. No new fracture or dislocation is noted. IMPRESSION: Status post surgical internal fixation of proximal left femoral fracture. Electronically Signed   By: Marijo Conception M.D.   On: 10/09/2022 08:20   DG FEMUR MIN 2 VIEWS LEFT  Result Date: 10/09/2022 CLINICAL DATA:  [id number] with left rib fracture postoperative. EXAM: LEFT FEMUR 2 VIEWS COMPARISON:  Last postoperative study was 09/26/2022 FINDINGS: Dynamic compression screw fixation through a short intramedullary femoral nail is again noted of the previously described intertrochanteric fracture. The lesser trochanter again noted medially displaced and separately fractured. On the third image the main distal fracture fragment displaced from the proximal fragment up to 1 cm. No callus formation is yet seen. There is no new or worsening alignment abnormality. No new fracture. Skin staples again are noted overlying the lateral hip region. There is osteopenia. 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 Femoral artery calcific plaques. IMPRESSION: 1.  Dynamic compression screw fixation of the intertrochanteric fracture of the proximal left femur. No new or worsening alignment abnormality. No callus formation yet seen. 2. Osteopenia. 3. Atherosclerosis. Electronically Signed   By: Telford Nab M.D.   On: 10/09/2022 06:17   Recent Labs    10/09/22 0638  WBC 8.6  HGB 8.2*  HCT 24.6*  PLT 336    Recent Labs    10/08/22 0638 10/09/22 0638  NA 134* 133*  K 4.2 4.1  CL 94* 97*  CO2 29 28  GLUCOSE 110* 107*  BUN <5* <5*  CREATININE 0.64 0.73  CALCIUM 9.0 8.7*    Intake/Output Summary (Last 24 hours) at 10/10/2022 5176 Last data filed at 10/10/2022 1607 Gross per 24 hour  Intake 800 ml  Output 2050 ml  Net -1250 ml        Physical Exam: Vital Signs Blood pressure (!) 158/55, pulse 64, temperature 98.6 F (37 C), temperature source Oral, resp. rate 16, height '5\' 7"'$  (1.702 m), weight 72.7 kg, SpO2 97 %.      General: awake, alert, but kept falling asleep; NAD HENT: conjugate gaze; oropharynx moist CV: regular to bradycardic rate; no JVD Pulmonary: CTA B/L; no W/R/R- good air movement GI: soft, NT, ND, (+)BS- needs to be cathed Psychiatric: appropriate but quiet, sleepy Neurological: very sleepy- kept falling asleep Skin- has surgical dressing  in place with a little dried blood on dressing- can see without removing dressing Musculoskeletal:     Cervical back: Normal range of motion.     Comments: Left hip tender, still swollen - bruised- mainly yellow-  much better- foam dressing over L hip C/D/I Skin: L hip less swelling; dressing C/D/I    General: Skin is warm.     Findings: Bruising (arms) present.     Comments: Left hip incision CDI with dry dressing- no drainage through dressing- eating so could not unwrap. . Left ear as above. L ear we've removed bolster- still really bruised  Neurological:     Mental Status: She is alert and oriented to person, place, and time.     Cranial Nerves: No cranial nerve deficit.      Sensory: No sensory deficit.     Comments: Motor: 4-5/5 UE's. RLE 3/5 prox to 5/5 distal. LLE 1-2/5 (pain) to 4/5 distally. DTR's 1+, no abnl tone or ataxia   Assessment/Plan: 1. Functional deficits which require 3+ hours per day of interdisciplinary therapy in a comprehensive inpatient rehab setting. Physiatrist is providing close team supervision and 24 hour management of active medical problems listed below. Physiatrist and rehab team continue to assess barriers to discharge/monitor patient progress toward functional and medical goals  Care Tool:  Bathing    Body parts bathed by patient: Right arm, Left arm, Chest, Abdomen, Front perineal area, Right upper leg, Face, Left upper leg   Body parts bathed by helper: Left lower leg, Right lower leg, Buttocks     Bathing assist Assist Level: Moderate Assistance - Patient 50 - 74%     Upper Body Dressing/Undressing Upper body dressing   What is the patient wearing?: Pull over shirt    Upper body assist Assist Level: Set up assist    Lower Body Dressing/Undressing Lower body dressing      What is the patient wearing?: Incontinence brief, Pants     Lower body assist Assist for lower body dressing: Minimal Assistance - Patient > 75% (use of reacher and RW)     Toileting Toileting    Toileting assist Assist for toileting: Minimal Assistance - Patient > 75% (use of RW for standing balance)     Transfers Chair/bed transfer  Transfers assist  Chair/bed transfer activity did not occur: Safety/medical concerns (dizziness, pain, decreased balance)  Chair/bed transfer assist level: Minimal Assistance - Patient > 75%     Locomotion Ambulation   Ambulation assist   Ambulation activity did not occur: Safety/medical concerns (dizziness, pain, decreased balance)  Assist level: Minimal Assistance - Patient > 75% Assistive device: Walker-rolling Max distance: 12   Walk 10 feet activity   Assist  Walk 10 feet activity  did not occur: Safety/medical concerns (dizziness, pain, decreased balance)  Assist level: Minimal Assistance - Patient > 75% Assistive device: Walker-rolling   Walk 50 feet activity   Assist Walk 50 feet with 2 turns activity did not occur: Safety/medical concerns         Walk 150 feet activity   Assist Walk 150 feet activity did not occur: Safety/medical concerns         Walk 10 feet on uneven surface  activity   Assist Walk 10 feet on uneven surfaces activity did not occur: Safety/medical concerns (dizziness, pain, decreased balance)         Wheelchair     Assist Is the patient using a wheelchair?: Yes Type of Wheelchair: Manual Wheelchair activity did not occur:  Safety/medical concerns (dizziness, pain, decreased balance)         Wheelchair 50 feet with 2 turns activity    Assist    Wheelchair 50 feet with 2 turns activity did not occur: Safety/medical concerns (dizziness, pain, decreased balance)       Wheelchair 150 feet activity     Assist  Wheelchair 150 feet activity did not occur: Safety/medical concerns (dizziness, pain, decreased balance)       Blood pressure (!) 158/55, pulse 64, temperature 98.6 F (37 C), temperature source Oral, resp. rate 16, height '5\' 7"'$  (1.702 m), weight 72.7 kg, SpO2 97 %.  Medical Problem List and Plan: 1. Functional deficits secondary to left intertrochanteric femur fx after fall. Pt s/p IMN 09/25/22             -LLE WBAT             -patient may shower             -ELOS/Goals: 15-20 days  D/c set for 11/21  Con't Cir- PT and OT- might need to move d/c back due to surgery yesterday 2.  Antithrombotics: -DVT/anticoagulation:  xarelto '10mg'$  daily for 14 days             -antiplatelet therapy:  resume plavix '75mg'$  daily 3. Pain Management: tylenol and hydrocodone prn for pain             -robaxin prn for muscle spasms  11/8- pain limiting therapy- doesn't want opiates- mainly using tylenol-   11/9-  d/w pt- she's willing to try tramadol 50 mg q6 hours prn- she's scared of Coding if takes Norco- I explained that Tramadol is mild and shouldn't cause any cognitive issues.   11/10- pain much better- not resolved, but better with tramadol- con't tramadol and tylenol prn  11/14- pain still doing better with tramadol  11/17- is worse today since had surgery yesterday-doesn't want any other meds 4. Mood/Behavior/Sleep: team to provide ego support             -antipsychotic agents: n/a 5. Neuropsych/cognition: This patient is capable of making decisions on her own behalf. 6. Skin/Wound Care: dry dressings to left ear and hip  11/9- having some drainage- from L hip- is improved this Am- will monitor closely.  7. Fluids/Electrolytes/Nutrition: encourage appropriate PO             -pt's intake has inconsistent so far 8. Nausea:              -might be positional or med related.              -also having associated gas             -pt told me she's been moving bowels, however I don't see a bm recorded             -will try sorbitol today, dulcolax suppository prn             -protonix 11/8- has ileus- per #15 below  11/9- still having nausea- will change diet to clear liquid per pt request- and monitor  11/10- on clear liquid diet- pt wants to wait 1 more day before changing to regular diet- if not better tomorrow, will need to call GI  11/14- nausea resolved- back on regular diet  11/16- nauseated again  111/7- Put on IVFs for another 24 hours at 75cc/hour since had surgery at 10-11pm at night and didn't get back from PACU until 1am-  sh'e sleepy and not taking in much 9. Left lower lobe consolidation             -pneumonia ruled out. Likely atelectasis             -IS, OOB             -wean oxygen to off  11/7- will write order to wean O2.  10. Sinus bradycardia:             -on propranolol and verapamil at home             -continue to hold as HR in 50's  11/14- since still in 50s, will not  restart  11. Essential HTN             -continue hctz  11/14- BP usually controlled but up today- will monitor for trend  11/15- somewhat elevated, but not sure if dropping- see #21           11/16- started hydralazine  11/17- just got hydralazine- is slightly better- might need it increased over weekend Vitals:   10/10/22 0108 10/10/22 0411  BP: (!) 168/52 (!) 158/55  Pulse: 61 64  Resp: 15 16  Temp: 97.8 F (36.6 C) 98.6 F (37 C)  SpO2: 97% 97%    12. Right middle lobe nodule 2.3 cm- should be considered primary lung CA until ruled out- also has nodule as well 80m on R middle lung- could be mets per CT scan.              -f/u as outpt  11/7- d/w daughter- she said no f/u at this time- due to pt's age.   11/8- daughter doesn't want to let pt know about tumors.   13. Mild leukocytosis- 11.2   11/6- afebrile- other than difficulty swallowing- says it's dry?; denies UTI Sx's- if gets worse clinically, will recheck in AM- right now scheduled for Wed AM.   11/8- WBC down to 9.4-  14. Constipation  11/6- LBM on day of fall per pt and chart- will give Sorbitol after therapy and follow with SSE if needed.   11/7- had multiple BM's- didn't need SSE- per notes- feels cleaned out  15. ABLA  11/6- Down to 7.6- will recheck Wednesday to make sure not dropping further- and transfuse if required.   11/8- Hb 7.3- on edge of needing transfusion.   11/9- Hb up to 8.0-   11/10- Hb 7.7- will con't to monitor closely.   11/15- Hb 8.4 16. Post op ileus resolved BM x 3 11/10, 1 BM 11/12- advance diet as tolerated  17. Thrush?  11/9- will add Magic mouthwash- don't see plaques, but tongue dry and furrowed. Says things taste 'wierd".   11/10- said magic mouthwash helping Sx's.  18. Leukocytosis improved -   11/15- WBC down to 7.8  11/16- will recheck today to make sure not elevated  11/17- WBC stable at 8k    Latest Ref Rng & Units 10/09/2022    6:38 AM 10/06/2022    5:20 AM 10/03/2022    5:26  AM  CBC  WBC 4.0 - 10.5 K/uL 8.6  7.8  10.0   Hemoglobin 12.0 - 15.0 g/dL 8.2  8.4  7.7   Hematocrit 36.0 - 46.0 % 24.6  24.1  23.0   Platelets 150 - 400 K/uL 336  346  320     19. Hypokalemia and low Mg  11/10- repleted up to 3.5- will put on Kcl  10 mEq daily for now and recheck Monday- since K+ still 3.5- will also check Mg Monday as well   11/14= Mg 1.6 and K+ 3.1- repleted again  yesterday by pharmacy for [place]- will recheck labs iN AM  11/15- K+ 4.2- will give IV Mg 2G for Mg 1.6- and recheck in AM  11/16- Mg 1.7- still low- will put on PO Mg 400 mg BID 20. Swallowing issues  11/14- appears to be thrush- will start Diflucan- due to renal issues, will need to be 50 mg daily- x 7 days doses-   11/15- reports things taste better and was able ot swallow some this AM of breakfast-con't diflucan 21. Dizziness/lightheadedness/presyncope  11/15- will have pt get orthostatics checked by nursing and therapy- BP on high side, so not sure if Sx's due to BP being high or dropping.   11/16- added hydralazine 25 mg TID- is somewhat better today 22. Infection of L hip?  11/16- still having drainage from L hip- spoke to Ortho- they are taking her to OR today at 5pm for wash out-   11/17- Gram stains negative so far- waiting for Cx's- surgeon gave Ancef x2 IV and will monitor Cultures 23. Urinary retention  11/16- is new- will check U/A and Cx- and labs- CBC- with diff and CMP just to make sure no other issues  11/17- U/A (-) and doesn't have elevated WBC- mild left shift however- will start Flomax 0.4 mg q supper    I spent a total of 36    minutes on total care today- >50% coordination of care- due to d/w nursing about urinary retention- not having an urge to go- needing cathing- U/A (-) for UTI  LOS: 12 days A FACE TO FACE EVALUATION WAS PERFORMED  Slayter Moorhouse 10/10/2022, 9:52 AM

## 2022-10-10 NOTE — Anesthesia Postprocedure Evaluation (Signed)
Anesthesia Post Note  Patient: Anna Weiss  Procedure(s) Performed: IRRIGATION AND DEBRIDEMENT LEFT HIP (Left)     Patient location during evaluation: PACU Anesthesia Type: General Level of consciousness: awake and alert, patient cooperative and oriented Pain management: pain level controlled Vital Signs Assessment: post-procedure vital signs reviewed and stable Respiratory status: spontaneous breathing, nonlabored ventilation and respiratory function stable Cardiovascular status: blood pressure returned to baseline and stable Postop Assessment: no apparent nausea or vomiting Anesthetic complications: yes   Encounter Notable Events  Notable Event Outcome Phase Comment  Difficult to intubate - expected  Intraprocedure Filed from anesthesia note documentation.    Last Vitals:  Vitals:   10/10/22 0053 10/10/22 0108  BP:  (!) 168/52  Pulse: (!) 58 61  Resp: 16 15  Temp: 36.7 C 36.6 C  SpO2: 100% 97%    Last Pain:  Vitals:   10/10/22 0108  TempSrc: Oral  PainSc:                  Shaneil Yazdi,E. Naylah Cork

## 2022-10-10 NOTE — Progress Notes (Signed)
Pt has returned back on unit from surgery. Report received from PACU nurse.

## 2022-10-10 NOTE — Op Note (Addendum)
Orthopedic Surgery Operative Report  Procedure: Irrigation and debridement left hip that included skin, subcutaneous tissue, fat, and muscle  Modifier: 78  Date of procedure: 10/09/2022  Patient name: Anna Weiss MRN: [id number] DOB: 08-24-1925  Surgeon: Ileene Rubens, MD Assistant: none Pre-operative diagnosis: left hip wound infection and persistent drainage Post-operative diagnosis: same as above Findings: formed hematoma below the fascia at the proximal and distal most incisions, no purulent material, necrotic appearing subcutaneous tissue and muscle   Specimens: 1 wound swab from proximal wound, 1 wound swab from distal wounds 3 specimens from proximal wounds, 2 specimens from distal wound, 1 specimen from middle wound   -All of these were from the left hip surgical site Anesthesia: general EBL: 08MV Complications: none Antibiotic: ancef was given after specimens collected  Implants: none   Indication for procedure: Patient is a 88 y.o. female who had a ground level fall and sustained a left intertrochanteric femur fracture. She underwent intramedullary fixation for this fracture with me on 09/25/2022. She had persistent drainage over the two weeks. She had a significant amount of drainage on her dressing the morning of surgery on rounds. That dressing had been placed the night before. Given the amount of drainage and the fact that it had been two weeks since the index surgery, recommended irrigation and debridement of the wounds with cultures. The risks of surgery included wound dehiscence, infection, bleeding, need for transfusion, MI, death, and blood clot were covered with her. The benefit of the surgery would be to diagnose an infection if that is what is causing the drainage and to help tailor an antibiotic regimen for that organism by taking cultures. The other benefit would be to clear the wound of necrotic material and hopefully get the wound to heal. The alternatives of  continued observation and empiric antibiotics were covered with her. Afterwards, patient elected to proceed with surgery. She was made NPO with a plan for surgery later that day.   Procedure Description: The patient was met in the pre-operative holding area. The patient's identity and consent were verified. The operative site was marked. The patient's remaining questions about the surgery were answered. The patient was brought back to the operating room. General anesthesia was induced and an endotracheal tube was placed by the anesthesia staff. The patient was transferred to the [facility] table and placed in the lateral decubitus position with the left hip up. A bean bag was used to keep her in that position. An axillary roll was placed in the down arm's axilla. All bony prominences were well padded. The patient's skin was cleansed with alcohol. Next, the left lateral hip was prepped and draped in a standard, sterile fashion. A time out was performed that identified the patient, the procedure, and the laterality. All team members agreed with what was stated in the time out.   The prior surgery's staples were removed at every incision. A scissors was used to cut the sutures in the deep dermal layer and the fascial layers in the most proximal wound. A rongeur was used to remove all suture. The skin edges were sharply excised with a 10 blade knife to get to healthy bleeding skin edges. A specimen swab was placed deep into the most proximal wound and a swab was collected. Then, a large curette was used to debride some of the loose and necrotic tissue superficially in the wound in the subcutaneous tissue and fat. Some of this material was collected and sent as specimen. A curette was  then used to debride loose and necrotic tissue deeper in the wound. This included some necrotic appearing muscle. Some of this was collected and sent as specimen for culture. Next, underneath the fascia there was a coagulated hematoma that  was evaluated with the same curette. Some of this and the tissue that came with it was sent as specimen as well. There was some loose muscle and fascia within the wound that was sharply excised with a scissors. A cobb was used to debride any more loose or necrotic tissue within the wound. At this point, there was no more loose or necrotic appearing tissue within the proximal most wound.   Attention was turned to the distal most wound. The staples had already been removed. A knife was used to cut the deep dermal and fascia sutures in the wound. A rongeur was used to remove these sutures. A knife was used to sharply excise the skin edges and get to healthy bleeding skin edges. A wood handle elevator was used to debride the subcutaneous tissue and fat. Some of this was collected and sent for specimen. The large curette was then used deeper in the wound to debride loose muscle and a hematoma that was below the fascia. Some of this was sent as specimen. The large curette was then used throughout the wound to debride any remaining loose tissue and necrotic tissue. There was no remaining hematoma, loose, or necrotic tissue at this point.   Then, the deep dermal sutures were cut out of the middle incision. A rongeur was used to remove these sutures. A 10 blade knife was used to sharply excise the skin edges to get to healthy appearing, bleeding skin edges.  A large curette was used to debride the subcutaneous tissue here. This debrided material was collected as specimen. There was minimal loose and necrotic tissue in this wound. There was also no hematoma at this level. This completed specimen collection and ancef was given at this time.   All three wounds were then irrigated with 3L of sterile saline via cysto tubing. At the end of this, all three wounds appearing clean. There was healthy bleeding tissue within the wounds. There was no more necrotic or loose tissue. There was no remaining hematomas.  The fascia  layer was closed with 0 vicryl. The deep dermal layer was closed with 2-0 vicryl. The skin was closed with 3-0 nylon in a horizontal mattress fashion. The wounds were dressing with mepilex bandages.   Post-operative plan: The patient will recover in the post-anesthesia care unit and then go back to the floor. The patient will receive two post-operative doses of ancef. The patient will be out of bed as tolerated and can weight bear as tolerated. The cultures will be followed and if they turn positive, antibiotics will be started and eventually tailored to the organism.   Ileene Rubens Orthopedic Surgeon

## 2022-10-11 DIAGNOSIS — K567 Ileus, unspecified: Secondary | ICD-10-CM

## 2022-10-11 DIAGNOSIS — R339 Retention of urine, unspecified: Secondary | ICD-10-CM

## 2022-10-11 DIAGNOSIS — M25552 Pain in left hip: Secondary | ICD-10-CM

## 2022-10-11 DIAGNOSIS — L24A9 Irritant contact dermatitis due friction or contact with other specified body fluids: Secondary | ICD-10-CM

## 2022-10-11 NOTE — Progress Notes (Addendum)
PROGRESS NOTE   Subjective/Complaints:  Sitting in chair, no new concerns or complaints. Reports pain is under control. Bladder scans < 368m this AM.    ROS:  Pt denies SOB, HA, abd pain, CP, N/V/C/D, and vision changes  Except per HPI   Objective:   No results found. Recent Labs    10/09/22 0638  WBC 8.6  HGB 8.2*  HCT 24.6*  PLT 336     Recent Labs    10/09/22 0638  NA 133*  K 4.1  CL 97*  CO2 28  GLUCOSE 107*  BUN <5*  CREATININE 0.73  CALCIUM 8.7*     Intake/Output Summary (Last 24 hours) at 10/11/2022 1241 Last data filed at 10/11/2022 0855 Gross per 24 hour  Intake 240 ml  Output --  Net 240 ml         Physical Exam: Vital Signs Blood pressure (!) 110/47, pulse (!) 52, temperature 97.9 F (36.6 C), temperature source Oral, resp. rate 16, height '5\' 7"'$  (1.702 m), weight 72.7 kg, SpO2 98 %.      General: awake, alert, sitting in chair NAD HENT: conjugate gaze; oropharynx moist CV: regular to bradycardic rate; no JVD Pulmonary: CTA B/L; no W/R/R- good air movement GI: soft, NT, ND, (+)BS-  2 continent voids this AM Psychiatric: appropriate but quiet, sleepy Neurological: awake and alert Skin- has surgical dressing in place with a little dried blood on dressing- can see without removing dressing Musculoskeletal:     Cervical back: Normal range of motion.     Comments: Left hip tender, still swollen - bruised- mainly yellow-  much better- foam dressing over L hip C/D/I Skin: L hip less swelling; dressing C/D/I    General: Skin is warm.     Findings: Bruising (arms) present.     Comments: Left hip incision CDI with dry dressing- no drainage through dressing- eating so could not unwrap. . Left ear as above. L ear we've removed bolster- still really bruised  Neurological:     Mental Status: She is alert and oriented to person, place, and time.     Cranial Nerves: No cranial nerve  deficit.     Sensory: No sensory deficit.     Comments: Motor: 4-5/5 UE's. RLE 3/5 prox to 5/5 distal. LLE 1-2/5 (pain) to 4/5 distally. DTR's 1+, no abnl tone or ataxia   Assessment/Plan: 1. Functional deficits which require 3+ hours per day of interdisciplinary therapy in a comprehensive inpatient rehab setting. Physiatrist is providing close team supervision and 24 hour management of active medical problems listed below. Physiatrist and rehab team continue to assess barriers to discharge/monitor patient progress toward functional and medical goals  Care Tool:  Bathing    Body parts bathed by patient: Right arm, Left arm, Chest, Abdomen, Front perineal area, Right upper leg, Face, Left upper leg   Body parts bathed by helper: Left lower leg, Right lower leg, Buttocks     Bathing assist Assist Level: Moderate Assistance - Patient 50 - 74%     Upper Body Dressing/Undressing Upper body dressing   What is the patient wearing?: Pull over shirt    Upper body assist Assist Level:  Set up assist    Lower Body Dressing/Undressing Lower body dressing      What is the patient wearing?: Incontinence brief, Pants     Lower body assist Assist for lower body dressing: Minimal Assistance - Patient > 75% (use of reacher and RW)     Toileting Toileting    Toileting assist Assist for toileting: Moderate Assistance - Patient 50 - 74%     Transfers Chair/bed transfer  Transfers assist  Chair/bed transfer activity did not occur: Safety/medical concerns (dizziness, pain, decreased balance)  Chair/bed transfer assist level: Minimal Assistance - Patient > 75%     Locomotion Ambulation   Ambulation assist   Ambulation activity did not occur: Safety/medical concerns (dizziness, pain, decreased balance)  Assist level: Minimal Assistance - Patient > 75% Assistive device: Walker-rolling Max distance: 38   Walk 10 feet activity   Assist  Walk 10 feet activity did not occur:  Safety/medical concerns (dizziness, pain, decreased balance)  Assist level: Minimal Assistance - Patient > 75% Assistive device: Walker-rolling   Walk 50 feet activity   Assist Walk 50 feet with 2 turns activity did not occur: Safety/medical concerns         Walk 150 feet activity   Assist Walk 150 feet activity did not occur: Safety/medical concerns         Walk 10 feet on uneven surface  activity   Assist Walk 10 feet on uneven surfaces activity did not occur: Safety/medical concerns (dizziness, pain, decreased balance)         Wheelchair     Assist Is the patient using a wheelchair?: Yes Type of Wheelchair: Manual Wheelchair activity did not occur: Safety/medical concerns (dizziness, pain, decreased balance)         Wheelchair 50 feet with 2 turns activity    Assist    Wheelchair 50 feet with 2 turns activity did not occur: Safety/medical concerns (dizziness, pain, decreased balance)       Wheelchair 150 feet activity     Assist  Wheelchair 150 feet activity did not occur: Safety/medical concerns (dizziness, pain, decreased balance)       Blood pressure (!) 110/47, pulse (!) 52, temperature 97.9 F (36.6 C), temperature source Oral, resp. rate 16, height '5\' 7"'$  (1.702 m), weight 72.7 kg, SpO2 98 %.  Medical Problem List and Plan: 1. Functional deficits secondary to left intertrochanteric femur fx after fall. Pt s/p IMN 09/25/22             -LLE WBAT             -patient may shower             -ELOS/Goals: 15-20 days  D/c set for 11/21  Con't Cir- PT and OT- might need to move d/c back due to surgery yesterday 2.  Antithrombotics: -DVT/anticoagulation:  xarelto '10mg'$  daily for 14 days             -antiplatelet therapy:  resume plavix '75mg'$  daily 3. Pain Management: tylenol and hydrocodone prn for pain             -robaxin prn for muscle spasms  11/8- pain limiting therapy- doesn't want opiates- mainly using tylenol-   11/9- d/w pt-  she's willing to try tramadol 50 mg q6 hours prn- she's scared of Coding if takes Norco- I explained that Tramadol is mild and shouldn't cause any cognitive issues.   11/10- pain much better- not resolved, but better with tramadol- con't tramadol and tylenol prn  11/14- pain still doing better with tramadol  11/17- is worse today since had surgery yesterday-doesn't want any other meds  11/18 reports pain controlled, continue tramadol PRN 4. Mood/Behavior/Sleep: team to provide ego support             -antipsychotic agents: n/a 5. Neuropsych/cognition: This patient is capable of making decisions on her own behalf. 6. Skin/Wound Care: dry dressings to left ear and hip  11/9- having some drainage- from L hip- is improved this Am- will monitor closely.  7. Fluids/Electrolytes/Nutrition: encourage appropriate PO             -pt's intake has inconsistent so far 8. Nausea:              -might be positional or med related.              -also having associated gas             -pt told me she's been moving bowels, however I don't see a bm recorded             -will try sorbitol today, dulcolax suppository prn             -protonix 11/8- has ileus- per #15 below  11/9- still having nausea- will change diet to clear liquid per pt request- and monitor  11/10- on clear liquid diet- pt wants to wait 1 more day before changing to regular diet- if not better tomorrow, will need to call GI  11/14- nausea resolved- back on regular diet  11/16- nauseated again  111/7- Put on IVFs for another 24 hours at 75cc/hour since had surgery at 10-11pm at night and didn't get back from PACU until 1am- sh'e sleepy and not taking in much  11/18 LBM today, improved continue to monitor 9. Left lower lobe consolidation             -pneumonia ruled out. Likely atelectasis             -IS, OOB             -wean oxygen to off  11/7- will write order to wean O2.  10. Sinus bradycardia:             -on propranolol and  verapamil at home             -continue to hold as HR in 50's  11/14- since still in 50s, will not restart  11. Essential HTN             -continue hctz  11/14- BP usually controlled but up today- will monitor for trend  11/15- somewhat elevated, but not sure if dropping- see #21           11/16- started hydralazine  11/17- just got hydralazine- is slightly better- might need it increased over weekend  -11/18 fairly well controlled, continue to monitor Vitals:   10/11/22 0344 10/11/22 0828  BP: (!) 140/41 (!) 110/47  Pulse: (!) 58 (!) 52  Resp: 16   Temp: 97.9 F (36.6 C)   SpO2: 95% 98%    12. Right middle lobe nodule 2.3 cm- should be considered primary lung CA until ruled out- also has nodule as well 53m on R middle lung- could be mets per CT scan.              -f/u as outpt  11/7- d/w daughter- she said no f/u at this time- due to pt's age.   11/8-  daughter doesn't want to let pt know about tumors.   13. Mild leukocytosis- 11.2   11/6- afebrile- other than difficulty swallowing- says it's dry?; denies UTI Sx's- if gets worse clinically, will recheck in AM- right now scheduled for Wed AM.   11/8- WBC down to 9.4-  14. Constipation  11/6- LBM on day of fall per pt and chart- will give Sorbitol after therapy and follow with SSE if needed.   11/7- had multiple BM's- didn't need SSE- per notes- feels cleaned out  15. ABLA  11/6- Down to 7.6- will recheck Wednesday to make sure not dropping further- and transfuse if required.   11/8- Hb 7.3- on edge of needing transfusion.   11/9- Hb up to 8.0-   11/10- Hb 7.7- will con't to monitor closely.   11/15- Hb 8.4 16. Post op ileus resolved BM x 3 11/10, 1 BM 11/12- advance diet as tolerated  17. Thrush?  11/9- will add Magic mouthwash- don't see plaques, but tongue dry and furrowed. Says things taste 'wierd".   11/10- said magic mouthwash helping Sx's.  18. Leukocytosis improved -   11/15- WBC down to 7.8  11/16- will recheck today  to make sure not elevated  11/17- WBC stable at 8k    Latest Ref Rng & Units 10/09/2022    6:38 AM 10/06/2022    5:20 AM 10/03/2022    5:26 AM  CBC  WBC 4.0 - 10.5 K/uL 8.6  7.8  10.0   Hemoglobin 12.0 - 15.0 g/dL 8.2  8.4  7.7   Hematocrit 36.0 - 46.0 % 24.6  24.1  23.0   Platelets 150 - 400 K/uL 336  346  320     19. Hypokalemia and low Mg  11/10- repleted up to 3.5- will put on Kcl 10 mEq daily for now and recheck Monday- since K+ still 3.5- will also check Mg Monday as well   11/14= Mg 1.6 and K+ 3.1- repleted again  yesterday by pharmacy for [place]- will recheck labs iN AM  11/15- K+ 4.2- will give IV Mg 2G for Mg 1.6- and recheck in AM  11/16- Mg 1.7- still low- will put on PO Mg 400 mg BID 20. Swallowing issues  11/14- appears to be thrush- will start Diflucan- due to renal issues, will need to be 50 mg daily- x 7 days doses-   11/15- reports things taste better and was able ot swallow some this AM of breakfast-con't diflucan  11/18 ate 100% of breakfast today, denies cough 21. Dizziness/lightheadedness/presyncope  11/15- will have pt get orthostatics checked by nursing and therapy- BP on high side, so not sure if Sx's due to BP being high or dropping.   11/16- added hydralazine 25 mg TID- is somewhat better today 22. Infection of L hip?  11/16- still having drainage from L hip- spoke to Ortho- they are taking her to OR today at 5pm for wash out-   11/17- Gram stains negative so far- waiting for Cx's- surgeon gave Ancef x2 IV and will monitor Cultures  11/18 Wound cultures pending 23. Urinary retention  11/16- is new- will check U/A and Cx- and labs- CBC- with diff and CMP just to make sure no other issues  11/17- U/A (-) and doesn't have elevated WBC- mild left shift however- will start Flomax 0.4 mg q supper  11/8 continent PVRs a little improved today< 390m, continue to monitor, continue flomax, IC if > 350 ml    I spent a  total of 36    minutes on total care today- >50%  coordination of care- due to d/w nursing about urinary retention- not having an urge to go- needing cathing- U/A (-) for UTI  LOS: 13 days A FACE TO FACE EVALUATION WAS PERFORMED  Jennye Boroughs 10/11/2022, 12:41 PM

## 2022-10-11 NOTE — Plan of Care (Signed)
  Problem: Consults Goal: RH GENERAL PATIENT EDUCATION Description: See Patient Education module for education specifics. Outcome: Progressing   Problem: RH BOWEL ELIMINATION Goal: RH STG MANAGE BOWEL WITH ASSISTANCE Description: STG Manage Bowel with mod i Assistance. Outcome: Progressing Goal: RH STG MANAGE BOWEL W/MEDICATION W/ASSISTANCE Description: STG Manage Bowel with Medication with mod I  Assistance. Outcome: Progressing   Problem: RH BLADDER ELIMINATION Goal: RH STG MANAGE BLADDER WITH ASSISTANCE Description: STG Manage Bladder With toileting Assistance Outcome: Progressing   Problem: RH SAFETY Goal: RH STG ADHERE TO SAFETY PRECAUTIONS W/ASSISTANCE/DEVICE Description: STG Adhere to Safety Precautions With Assistance/Device. Outcome: Progressing   Problem: RH PAIN MANAGEMENT Goal: RH STG PAIN MANAGED AT OR BELOW PT'S PAIN GOAL Description: < 4 with prns Outcome: Progressing   Problem: RH KNOWLEDGE DEFICIT GENERAL Goal: RH STG INCREASE KNOWLEDGE OF SELF CARE AFTER HOSPITALIZATION Description: Patient and family will be able to manage care at discharge using educational handouts independently Outcome: Progressing   Problem: Education: Goal: Knowledge of General Education information will improve Description: Including pain rating scale, medication(s)/side effects and non-pharmacologic comfort measures Outcome: Progressing   Problem: Health Behavior/Discharge Planning: Goal: Ability to manage health-related needs will improve Outcome: Progressing   Problem: Clinical Measurements: Goal: Ability to maintain clinical measurements within normal limits will improve Outcome: Progressing Goal: Will remain free from infection Outcome: Progressing Goal: Diagnostic test results will improve Outcome: Progressing Goal: Respiratory complications will improve Outcome: Progressing Goal: Cardiovascular complication will be avoided Outcome: Progressing   Problem:  Activity: Goal: Risk for activity intolerance will decrease Outcome: Progressing   Problem: Nutrition: Goal: Adequate nutrition will be maintained Outcome: Progressing   Problem: Coping: Goal: Level of anxiety will decrease Outcome: Progressing   Problem: Elimination: Goal: Will not experience complications related to bowel motility Outcome: Progressing Goal: Will not experience complications related to urinary retention Outcome: Progressing   Problem: Pain Managment: Goal: General experience of comfort will improve Outcome: Progressing   Problem: Safety: Goal: Ability to remain free from injury will improve Outcome: Progressing

## 2022-10-11 NOTE — Plan of Care (Signed)
Problem: Consults Goal: RH GENERAL PATIENT EDUCATION Description: See Patient Education module for education specifics. 10/11/2022 1458 by Sanda Linger, RN Outcome: Progressing 10/11/2022 1443 by Sanda Linger, RN Outcome: Progressing   Problem: RH BOWEL ELIMINATION Goal: RH STG MANAGE BOWEL WITH ASSISTANCE Description: STG Manage Bowel with mod i Assistance. 10/11/2022 1458 by Sanda Linger, RN Outcome: Progressing 10/11/2022 1443 by Sanda Linger, RN Outcome: Progressing Goal: RH STG MANAGE BOWEL W/MEDICATION W/ASSISTANCE Description: STG Manage Bowel with Medication with mod I  Assistance. 10/11/2022 1458 by Sanda Linger, RN Outcome: Progressing 10/11/2022 1443 by Sanda Linger, RN Outcome: Progressing   Problem: RH BLADDER ELIMINATION Goal: RH STG MANAGE BLADDER WITH ASSISTANCE Description: STG Manage Bladder With toileting Assistance 10/11/2022 1458 by Sanda Linger, RN Outcome: Progressing 10/11/2022 1443 by Sanda Linger, RN Outcome: Progressing   Problem: RH SAFETY Goal: RH STG ADHERE TO SAFETY PRECAUTIONS W/ASSISTANCE/DEVICE Description: STG Adhere to Safety Precautions With Assistance/Device. 10/11/2022 1458 by Sanda Linger, RN Outcome: Progressing 10/11/2022 1443 by Sanda Linger, RN Outcome: Progressing   Problem: RH PAIN MANAGEMENT Goal: RH STG PAIN MANAGED AT OR BELOW PT'S PAIN GOAL Description: < 4 with prns 10/11/2022 1458 by Sanda Linger, RN Outcome: Progressing 10/11/2022 1443 by Sanda Linger, RN Outcome: Progressing   Problem: RH KNOWLEDGE DEFICIT GENERAL Goal: RH STG INCREASE KNOWLEDGE OF SELF CARE AFTER HOSPITALIZATION Description: Patient and family will be able to manage care at discharge using educational handouts independently 10/11/2022 1458 by Sanda Linger, RN Outcome: Progressing 10/11/2022 1443 by Sanda Linger, RN Outcome: Progressing   Problem: Education: Goal: Knowledge of General Education information will  improve Description: Including pain rating scale, medication(s)/side effects and non-pharmacologic comfort measures 10/11/2022 1458 by Sanda Linger, RN Outcome: Progressing 10/11/2022 1443 by Sanda Linger, RN Outcome: Progressing   Problem: Health Behavior/Discharge Planning: Goal: Ability to manage health-related needs will improve 10/11/2022 1458 by Sanda Linger, RN Outcome: Progressing 10/11/2022 1443 by Sanda Linger, RN Outcome: Progressing   Problem: Clinical Measurements: Goal: Ability to maintain clinical measurements within normal limits will improve 10/11/2022 1458 by Sanda Linger, RN Outcome: Progressing 10/11/2022 1443 by Sanda Linger, RN Outcome: Progressing Goal: Will remain free from infection 10/11/2022 1458 by Sanda Linger, RN Outcome: Progressing 10/11/2022 1443 by Sanda Linger, RN Outcome: Progressing Goal: Diagnostic test results will improve 10/11/2022 1458 by Sanda Linger, RN Outcome: Progressing 10/11/2022 1443 by Sanda Linger, RN Outcome: Progressing Goal: Respiratory complications will improve 10/11/2022 1458 by Sanda Linger, RN Outcome: Progressing 10/11/2022 1443 by Sanda Linger, RN Outcome: Progressing Goal: Cardiovascular complication will be avoided 10/11/2022 1458 by Sanda Linger, RN Outcome: Progressing 10/11/2022 1443 by Sanda Linger, RN Outcome: Progressing   Problem: Nutrition: Goal: Adequate nutrition will be maintained 10/11/2022 1458 by Sanda Linger, RN Outcome: Progressing 10/11/2022 1443 by Sanda Linger, RN Outcome: Progressing   Problem: Coping: Goal: Level of anxiety will decrease 10/11/2022 1458 by Sanda Linger, RN Outcome: Progressing 10/11/2022 1443 by Sanda Linger, RN Outcome: Progressing   Problem: Elimination: Goal: Will not experience complications related to bowel motility 10/11/2022 1458 by Sanda Linger, RN Outcome: Progressing 10/11/2022 1443 by Sanda Linger,  RN Outcome: Progressing Goal: Will not experience complications related to urinary retention 10/11/2022 1458 by Sanda Linger, RN Outcome: Progressing 10/11/2022 1443 by Sanda Linger, RN Outcome: Progressing   Problem: Pain Managment: Goal:  General experience of comfort will improve 10/11/2022 1458 by Sanda Linger, RN Outcome: Progressing 10/11/2022 1443 by Sanda Linger, RN Outcome: Progressing   Problem: Safety: Goal: Ability to remain free from injury will improve 10/11/2022 1458 by Sanda Linger, RN Outcome: Progressing 10/11/2022 1443 by Sanda Linger, RN Outcome: Progressing

## 2022-10-11 NOTE — Progress Notes (Signed)
Physical Therapy Session Note  Patient Details  Name: Anna Weiss MRN: [id number] Date of Birth: 22-Feb-1925  Today's Date: 10/11/2022 PT Individual Time: 0901-1011 PT Individual Time Calculation (min): 70 min   Short Term Goals: Week 1:  PT Short Term Goal 1 (Week 1): pt will transfer supine<>sitting EOB with min A consistantly PT Short Term Goal 1 - Progress (Week 1): Met PT Short Term Goal 2 (Week 1): pt will transfer sit<>stand with LRAD and mod A PT Short Term Goal 2 - Progress (Week 1): Met PT Short Term Goal 3 (Week 1): pt will transfer bed<>WC with LRAD and mod A PT Short Term Goal 3 - Progress (Week 1): Met  Skilled Therapeutic Interventions/Progress Updates:  Pt was seen bedside in the am. Pt performed B LEs exercises, AAROM, 3 sets x 10 reps each, heel slides, hip abd/add, SAQs, and SLRs. Pt transferred to edge of bed with min A, side rail and verbal cues. Pt able to sit on edge of bed with S. Pt transferred sit to stand from edge of bed with min to mod A and verbal cues. Pt transferred to w/c with rolling walker and min A with verbal cues. Pt transported to rehab gym. Pt performed hip flex and LAQs in w/c, 3 sets x 10 reps each. Pt performed multiple sit to stands from wc with rolling walker and c/g to min A with verbal cues. Pt ambulated 38 feet x 2 with rolling walker and min A. Pt returned to room after treatment . Pt transferred w.c to recliner with rolling walker and min A with verbal cues. Pt left sitting up in recliner with chair alarm and all needs within reach.   Therapy Documentation Precautions:  Precautions Precautions: Fall, Other (comment) Precaution Comments: monitor O2, orthostatics Restrictions Weight Bearing Restrictions: Yes LLE Weight Bearing: Weight bearing as tolerated General:   Pain: Pt c/o 4/10 L LE pain.   Therapy/Group: Individual Therapy  Dub Amis 10/11/2022, 12:13 PM

## 2022-10-11 NOTE — Progress Notes (Signed)
Orthopedic Surgery Post-operative Check Note   Assessment: Patient is a 88 y.o. female with left intertrochanteric femur fracture status post IMN, developed persistent drainage and now s/p I&D   Plan: -Operative plans: complete -Keep dressing dry and in place at all times -Will follow intra-operative cultures (no organisms) -Diet: regular -DVT ppx: xarelto -Antibiotics: ancef x2 post-op doses -Weight bearing status: as tolerated -PT/OT evaluate and treat -Pain control -Dispo: remain floor status  ___________________________________________________________________________  Subjective: No acute events overnight. Pain is adequately controlled with current medications. Denies paresthesias and numbness.    Physical Exam:  General: no acute distress, laying in bed, appears stated age Respiratory: unlabored breathing, symmetric chest rise Neurologic: sleeping but awakes to voice, alert, orientated, following commands   MSK:   -Left lower extremity  Dressings with small amount of blood otherwise c/d/i EHL/TA/GSC intact Sensation intact to light touch in sural/saphenous/deep peroneal/superficial peroneal/tibial nerve distributions Foot warm and well perfused   Patient name: Anna Weiss Patient MRN: [id number] Date: 10/11/22

## 2022-10-12 ENCOUNTER — Inpatient Hospital Stay: Payer: Self-pay

## 2022-10-12 DIAGNOSIS — E876 Hypokalemia: Secondary | ICD-10-CM

## 2022-10-12 DIAGNOSIS — D62 Acute posthemorrhagic anemia: Secondary | ICD-10-CM

## 2022-10-12 DIAGNOSIS — S72142D Displaced intertrochanteric fracture of left femur, subsequent encounter for closed fracture with routine healing: Principal | ICD-10-CM

## 2022-10-12 DIAGNOSIS — D72829 Elevated white blood cell count, unspecified: Secondary | ICD-10-CM

## 2022-10-12 LAB — GLUCOSE, CAPILLARY: Glucose-Capillary: 125 mg/dL — ABNORMAL HIGH (ref 70–99)

## 2022-10-12 MED ORDER — VANCOMYCIN HCL 1500 MG/300ML IV SOLN
1500.0000 mg | Freq: Once | INTRAVENOUS | Status: AC
  Administered 2022-10-12: 1500 mg via INTRAVENOUS
  Filled 2022-10-12: qty 300

## 2022-10-12 MED ORDER — SODIUM CHLORIDE 0.9 % IV SOLN: INTRAVENOUS | Status: DC | PRN

## 2022-10-12 MED ORDER — VANCOMYCIN HCL IN DEXTROSE 1-5 GM/200ML-% IV SOLN
1000.0000 mg | INTRAVENOUS | Status: DC
  Administered 2022-10-13 – 2022-10-14 (×2): 1000 mg via INTRAVENOUS
  Filled 2022-10-12 (×2): qty 200

## 2022-10-12 NOTE — Progress Notes (Signed)
Orthopedic Surgery Post-operative Check Note   Assessment: Patient is a 88 y.o. female with left intertrochanteric femur fracture status post IMN, developed persistent drainage and now s/p I&D   Plan: -Operative plans: complete -Recommend infectious disease consult -Keep dressing dry and in place at all times -Will follow intra-operative cultures (staph species) -Diet: regular -DVT ppx: xarelto -Antibiotics: vancomycin -Weight bearing status: as tolerated -PT/OT evaluate and treat -Pain control -Dispo: remain floor status  ___________________________________________________________________________  Subjective: No acute events overnight. Cultures resulted in staph species yesterday. Hip pain is getting better. Was able to walk with physical therapy yesterday. Not particularly hungry this morning. Discussed eating her banana and eggs to get some protein and vitamins for wound and fracture healing. Denies paresthesias and numbness.    Physical Exam:  General: no acute distress, laying in bed, appears stated age Respiratory: unlabored breathing, symmetric chest rise Neurologic: sleeping but awakes to voice, alert, orientated, following commands   MSK:   -Left lower extremity  Dressings with small amount of blood otherwise c/d/I (no change in drainage on dressing from yesterday) EHL/TA/GSC intact Sensation intact to light touch in sural/saphenous/deep peroneal/superficial peroneal/tibial nerve distributions Foot warm and well perfused   Patient name: Anna Weiss Patient MRN: [id number] Date: 10/12/22

## 2022-10-12 NOTE — Consult Note (Signed)
[facility]    Date of Admission:  09/28/2022          Reason for Consult: Postop wound    Principal Problem:   Fracture, intertrochanteric, left femur, sequela Active Problems:   Drainage from wound   Assessment: 88 YF admitted to inpatient rehab following a fall sustaining left  intertrochanteric femur fracture status post open reduction of left intertrochanteric femur fracture with intramedullary nail placement on 09/25/22. She had continued drainage form post-op wound and taken to OR for debridement.  #Postop wound with intramedullary nail in place SP debridement on 11/6 with OR Cx+ MRSA -Taken to OR with Dr. Laurance Flatten, orthopedic surgery.  OR findings notable for necrotic subcutaneous tissue and muscle.  Cultures growing MRSA.  Given patient's age/comorbidities unclear if there are any plans for hardware removal.  Hold off on adding rifampin at this point until further information available. Recommendations:  -Place PICC -Continue vancomycin -Follow OR Cx -Anticipate 6 weeks of IV therapy followed by suppressive antibiotics aw HWR in place Microbiology:   Antibiotics: Vancomycin 11/19 Cefazolin 11/16-17 Cultures: OR 11/16 MRSA   HPI: Anna Weiss is a 88 y.o. female with hypertension, CVA, fall at home on Nov, 1, 2023 leading to left  intertrochanteric femur fracture status post open reduction of left intertrochanteric femur fracture with intramedullary nail placement discharged to i physical medicine rehab.Noted to have persistent drainage over the next 2 weeks.  Taken to the OR with orthopedics Dr. Laurance Flatten on 11/16 for irrigation debridement of left hip which included the skin, subcutaneous tissue, fat and muscles.  Or findings notable for hematoma below the fascia, no purulent material with necrotic appearing subcutaneous tissue and muscle.  Infectious disease engaged as or cultures grew MRSA.   Review of Systems: Review of Systems  All  other systems reviewed and are negative.   Past Medical History:  Diagnosis Date   High cholesterol    Hypertension    Stroke [facility])    2003   Tachycardia     Social History   Tobacco Use   Smoking status: Never   Smokeless tobacco: Never  Vaping Use   Vaping Use: Never used  Substance Use Topics   Alcohol use: No   Drug use: No    History reviewed. No pertinent family history. Scheduled Meds:  clopidogrel  75 mg Oral Daily   feeding supplement  237 mL Oral BID BM   fluconazole  50 mg Oral Daily   hydrALAZINE  25 mg Oral Q8H   hydrochlorothiazide  25 mg Oral Daily   levothyroxine  50 mcg Oral Q0600   magic mouthwash  10 mL Oral QID   magnesium oxide  400 mg Oral BID   methocarbamol  250 mg Oral TID   multivitamin with minerals  1 tablet Oral Daily   mupirocin ointment  1 Application Nasal BID   pantoprazole  40 mg Oral BID   polyvinyl alcohol  1 drop Both Eyes BID   potassium chloride  10 mEq Oral Daily   simvastatin  10 mg Oral QPM   tamsulosin  0.4 mg Oral QPC supper   Continuous Infusions:  sodium chloride 10 mL/hr at 10/12/22 0950   [START ON 10/13/2022] vancomycin     PRN Meds:.sodium chloride, acetaminophen, alum & mag hydroxide-simeth, antiseptic oral rinse, bisacodyl, guaiFENesin, Measure post void residual **AND** In and Out Cath **AND** lidocaine, lip balm, ondansetron, sorbitol, traMADol Allergies  Allergen Reactions  Aspirin Nausea Only    OBJECTIVE: Blood pressure (!) 153/47, pulse (!) 58, temperature 98.4 F (36.9 C), temperature source Oral, resp. rate 16, height '5\' 7"'$  (1.702 m), weight 72.7 kg, SpO2 98 %.  Physical Exam Constitutional:      Appearance: Normal appearance.  HENT:     Head: Normocephalic and atraumatic.     Right Ear: Tympanic membrane normal.     Left Ear: Tympanic membrane normal.     Nose: Nose normal.     Mouth/Throat:     Mouth: Mucous membranes are moist.  Eyes:     Extraocular Movements: Extraocular movements  intact.     Conjunctiva/sclera: Conjunctivae normal.     Pupils: Pupils are equal, round, and reactive to light.  Cardiovascular:     Rate and Rhythm: Normal rate and regular rhythm.     Heart sounds: No murmur heard.    No friction rub. No gallop.  Pulmonary:     Effort: Pulmonary effort is normal.     Breath sounds: Normal breath sounds.  Abdominal:     General: Abdomen is flat.     Palpations: Abdomen is soft.  Musculoskeletal:        General: Normal range of motion.  Skin:    General: Skin is warm and dry.     Comments: Left leg wound  Neurological:     General: No focal deficit present.     Mental Status: She is alert and oriented to person, place, and time.  Psychiatric:        Mood and Affect: Mood normal.     Lab Results Lab Results  Component Value Date   WBC 8.6 10/09/2022   HGB 8.2 (L) 10/09/2022   HCT 24.6 (L) 10/09/2022   MCV 111.8 (H) 10/09/2022   PLT 336 10/09/2022    Lab Results  Component Value Date   CREATININE 0.73 10/09/2022   BUN <5 (L) 10/09/2022   NA 133 (L) 10/09/2022   K 4.1 10/09/2022   CL 97 (L) 10/09/2022   CO2 28 10/09/2022    Lab Results  Component Value Date   ALT 15 10/09/2022   AST 24 10/09/2022   ALKPHOS 77 10/09/2022   BILITOT 2.1 (H) 10/09/2022       Laurice Record, [facility] 10/12/2022, 9:31 PM Rehab following

## 2022-10-12 NOTE — Progress Notes (Addendum)
PROGRESS NOTE   Subjective/Complaints:  Sitting in chair eating lunch.  Daughter in the room. Says she is not very hungry but will eat some lunch.  She also plans to drink Ensure.  ROS:  Pt denies SOB, HA, abd pain, fever/chills, CP, N/V/C/D, and vision changes  Except per HPI   Objective:   No results found. No results for input(s): "WBC", "HGB", "HCT", "PLT" in the last 72 hours.   No results for input(s): "NA", "K", "CL", "CO2", "GLUCOSE", "BUN", "CREATININE", "CALCIUM" in the last 72 hours.   Intake/Output Summary (Last 24 hours) at 10/12/2022 1205 Last data filed at 10/12/2022 0851 Gross per 24 hour  Intake 358 ml  Output --  Net 358 ml         Physical Exam: Vital Signs Blood pressure (!) 142/40, pulse 69, temperature 98.1 F (36.7 C), temperature source Oral, resp. rate 17, height '5\' 7"'$  (1.702 m), weight 72.7 kg, SpO2 93 %.      General: awake, alert, sitting in chair NAD, eating luch HENT: conjugate gaze; oropharynx moist CV: RRR; no JVD Pulmonary: CTA B/L; no W/R/R- good air movement GI: soft, NT, ND, (+)BS-  2 continent voids this AM Psychiatric: appropriate, pleasant Neurological: awake and alert Skin- has surgical dressing in place with a little dried blood on dressing- can see without removing dressing Musculoskeletal:     Cervical back: Normal range of motion.     Comments: Left hip tender, still swollen - bruised- mainly yellow-  much better- foam dressing over L hip C/D/I Skin: L hip less swelling; dressing C/D/I    General: Skin is warm.     Findings: Bruising (arms) present.     Comments: Left hip incision CDI with dry dressing- no drainage through dressing  Left ear as above. L ear we've removed bolster- still really bruised  Neurological:     Mental Status: She is alert and oriented to person, place, and time.     Cranial Nerves: No cranial nerve deficit.     Sensory: No sensory  deficit.     Comments: Motor: 4-5/5 UE's. RLE 3/5 prox to 5/5 distal. LLE 1-2/5 (pain) to 4/5 distally. DTR's 1+, no abnl tone or ataxia   Assessment/Plan: 1. Functional deficits which require 3+ hours per day of interdisciplinary therapy in a comprehensive inpatient rehab setting. Physiatrist is providing close team supervision and 24 hour management of active medical problems listed below. Physiatrist and rehab team continue to assess barriers to discharge/monitor patient progress toward functional and medical goals  Care Tool:  Bathing    Body parts bathed by patient: Right arm, Left arm, Chest, Abdomen, Front perineal area, Right upper leg, Face, Left upper leg   Body parts bathed by helper: Left lower leg, Right lower leg, Buttocks     Bathing assist Assist Level: Moderate Assistance - Patient 50 - 74%     Upper Body Dressing/Undressing Upper body dressing   What is the patient wearing?: Pull over shirt    Upper body assist Assist Level: Set up assist    Lower Body Dressing/Undressing Lower body dressing      What is the patient wearing?: Incontinence brief, Pants  Lower body assist Assist for lower body dressing: Minimal Assistance - Patient > 75% (use of reacher and RW)     Toileting Toileting    Toileting assist Assist for toileting: Moderate Assistance - Patient 50 - 74%     Transfers Chair/bed transfer  Transfers assist  Chair/bed transfer activity did not occur: Safety/medical concerns (dizziness, pain, decreased balance)  Chair/bed transfer assist level: Minimal Assistance - Patient > 75%     Locomotion Ambulation   Ambulation assist   Ambulation activity did not occur: Safety/medical concerns (dizziness, pain, decreased balance)  Assist level: Minimal Assistance - Patient > 75% Assistive device: Walker-rolling Max distance: 38   Walk 10 feet activity   Assist  Walk 10 feet activity did not occur: Safety/medical concerns (dizziness,  pain, decreased balance)  Assist level: Minimal Assistance - Patient > 75% Assistive device: Walker-rolling   Walk 50 feet activity   Assist Walk 50 feet with 2 turns activity did not occur: Safety/medical concerns         Walk 150 feet activity   Assist Walk 150 feet activity did not occur: Safety/medical concerns         Walk 10 feet on uneven surface  activity   Assist Walk 10 feet on uneven surfaces activity did not occur: Safety/medical concerns (dizziness, pain, decreased balance)         Wheelchair     Assist Is the patient using a wheelchair?: Yes Type of Wheelchair: Manual Wheelchair activity did not occur: Safety/medical concerns (dizziness, pain, decreased balance)         Wheelchair 50 feet with 2 turns activity    Assist    Wheelchair 50 feet with 2 turns activity did not occur: Safety/medical concerns (dizziness, pain, decreased balance)       Wheelchair 150 feet activity     Assist  Wheelchair 150 feet activity did not occur: Safety/medical concerns (dizziness, pain, decreased balance)       Blood pressure (!) 142/40, pulse 69, temperature 98.1 F (36.7 C), temperature source Oral, resp. rate 17, height '5\' 7"'$  (1.702 m), weight 72.7 kg, SpO2 93 %.  Medical Problem List and Plan: 1. Functional deficits secondary to left intertrochanteric femur fx after fall. Pt s/p IMN 09/25/22             -LLE WBAT             -patient may shower             -ELOS/Goals: 15-20 days  D/c set for 11/21  Con't Cir- PT and OT- might need to move d/c back due to surgery yesterday 2.  Antithrombotics: -DVT/anticoagulation:  xarelto '10mg'$  daily for 14 days             -antiplatelet therapy:  resume plavix '75mg'$  daily 3. Pain Management: tylenol and hydrocodone prn for pain             -robaxin prn for muscle spasms  11/8- pain limiting therapy- doesn't want opiates- mainly using tylenol-   11/9- d/w pt- she's willing to try tramadol 50 mg q6  hours prn- she's scared of Coding if takes Norco- I explained that Tramadol is mild and shouldn't cause any cognitive issues.   11/10- pain much better- not resolved, but better with tramadol- con't tramadol and tylenol prn  11/14- pain still doing better with tramadol  11/17- is worse today since had surgery yesterday-doesn't want any other meds  11/18 reports pain controlled, continue tramadol PRN 4.  Mood/Behavior/Sleep: team to provide ego support             -antipsychotic agents: n/a 5. Neuropsych/cognition: This patient is capable of making decisions on her own behalf. 6. Skin/Wound Care: dry dressings to left ear and hip  11/9- having some drainage- from L hip- is improved this Am- will monitor closely.  7. Fluids/Electrolytes/Nutrition: encourage appropriate PO             -pt's intake has inconsistent so far 8. Nausea:              -might be positional or med related.              -also having associated gas             -pt told me she's been moving bowels, however I don't see a bm recorded             -will try sorbitol today, dulcolax suppository prn             -protonix 11/8- has ileus- per #15 below  11/9- still having nausea- will change diet to clear liquid per pt request- and monitor  11/10- on clear liquid diet- pt wants to wait 1 more day before changing to regular diet- if not better tomorrow, will need to call GI  11/14- nausea resolved- back on regular diet  11/16- nauseated again  111/7- Put on IVFs for another 24 hours at 75cc/hour since had surgery at 10-11pm at night and didn't get back from PACU until 1am- sh'e sleepy and not taking in much  11/18 LBM today, improved continue to monitor 9. Left lower lobe consolidation             -pneumonia ruled out. Likely atelectasis             -IS, OOB             -wean oxygen to off  11/7- will write order to wean O2.  10. Sinus bradycardia:             -on propranolol and verapamil at home             -continue to  hold as HR in 50's  11/14- since still in 50s, will not restart  11. Essential HTN             -continue hctz  11/14- BP usually controlled but up today- will monitor for trend  11/15- somewhat elevated, but not sure if dropping- see #21           11/16- started hydralazine  11/17- just got hydralazine- is slightly better- might need it increased over weekend  -11/19 good control overall continue to monitor Vitals:   10/11/22 1833 10/12/22 0531  BP: (!) 133/41 (!) 142/40  Pulse: 60 69  Resp: 16 17  Temp: 98.4 F (36.9 C) 98.1 F (36.7 C)  SpO2: 99% 93%    12. Right middle lobe nodule 2.3 cm- should be considered primary lung CA until ruled out- also has nodule as well 75m on R middle lung- could be mets per CT scan.              -f/u as outpt  11/7- d/w daughter- she said no f/u at this time- due to pt's age.   11/8- daughter doesn't want to let pt know about tumors.   13. Mild leukocytosis- 11.2   11/6- afebrile- other than difficulty swallowing- says it's dry?; denies UTI  Sx's- if gets worse clinically, will recheck in AM- right now scheduled for Wed AM.   11/8- WBC down to 9.4-  14. Constipation  11/6- LBM on day of fall per pt and chart- will give Sorbitol after therapy and follow with SSE if needed.   11/7- had multiple BM's- didn't need SSE- per notes- feels cleaned out   11/19 LBM today, improved 15. ABLA  11/6- Down to 7.6- will recheck Wednesday to make sure not dropping further- and transfuse if required.   11/8- Hb 7.3- on edge of needing transfusion.   11/9- Hb up to 8.0-   11/10- Hb 7.7- will con't to monitor closely.   11/15- Hb 8.4 16. Post op ileus resolved BM x 3 11/10, 1 BM 11/12- advance diet as tolerated   -Recheck level tomorrow 17. Thrush?  11/9- will add Magic mouthwash- don't see plaques, but tongue dry and furrowed. Says things taste 'wierd".   11/10- said magic mouthwash helping Sx's.  18. Leukocytosis improved -   11/15- WBC down to 7.8  11/16-  will recheck today to make sure not elevated  11/17- WBC stable at [facility] tomorrow    Latest Ref Rng & Units 10/09/2022    6:38 AM 10/06/2022    5:20 AM 10/03/2022    5:26 AM  CBC  WBC 4.0 - 10.5 K/uL 8.6  7.8  10.0   Hemoglobin 12.0 - 15.0 g/dL 8.2  8.4  7.7   Hematocrit 36.0 - 46.0 % 24.6  24.1  23.0   Platelets 150 - 400 K/uL 336  346  320     19. Hypokalemia and low Mg  11/10- repleted up to 3.5- will put on Kcl 10 mEq daily for now and recheck Monday- since K+ still  3.5- will also check Mg Monday as well   11/14= Mg 1.6 and K+ 3.1- repleted again  yesterday by pharmacy for [place]- will recheck labs iN AM  11/15- K+ 4.2- will give IV Mg 2G for Mg 1.6- and recheck in AM  11/16- Mg 1.7- still low- will put on PO Mg 400 mg BID  Recheck K+ and MG tomorrow 20. Swallowing issues  11/14- appears to be thrush- will start Diflucan- due to renal issues, will need to be 50 mg daily- x 7 days doses-   11/15- reports things taste better and was able ot swallow some this AM of breakfast-con't diflucan  11/19 reports improved, continue to monitor 21. Dizziness/lightheadedness/presyncope  11/15- will have pt get orthostatics checked by nursing and therapy- BP on high side, so not sure if Sx's due to BP being high or dropping.   11/16- added hydralazine 25 mg TID- is somewhat better today 22. Infection of L hip?  11/16- still having drainage from L hip- spoke to Ortho- they are taking her to OR today at 5pm for wash out-   11/17- Gram stains negative so far- waiting for Cx's- surgeon gave Ancef x2 IV and will monitor Cultures  11/19 wound culture growing staph,Continue vancomycin, surgery advising ID consult, called ID to discuss -consult placed 23. Urinary retention  11/16- is new- will check U/A and Cx- and labs- CBC- with diff and CMP just to make sure no other issues  11/17- U/A (-) and doesn't have elevated WBC- mild left shift however- will start Flomax 0.4 mg q supper  11/8 continent  PVRs a little improved today< 387m, continue to monitor, continue flomax, IC if > 350 ml   LOS: 14  days A FACE TO FACE EVALUATION WAS PERFORMED  Jennye Boroughs 10/12/2022, 12:05 PM

## 2022-10-12 NOTE — Progress Notes (Signed)
Pharmacy Antibiotic Note  Anna Weiss is a 88 y.o. female admitted on 09/28/2022 with  left hip wound infection .  Pharmacy has been consulted for Vancomycin dosing. Pt Scr 0.73 and estimated CrCl 40.9 mL/min, which is stable along with WBC 8.6 and Temp 98.1 (afebrile).  Plan: Loading dose: Vancomycin 1500 mg IV x 1 dose (11/19) Followed by Vancomycin 1000 mg IV q24h start on 11/20 Calculated AUC: 498.1  Monitor renal function, WBC, Temp, and cultures  Height: '5\' 7"'$  (170.2 cm) Weight: 72.7 kg (160 lb 4.4 oz) IBW/kg (Calculated) : 61.6  Temp (24hrs), Avg:98.1 F (36.7 C), Min:97.7 F (36.5 C), Max:98.4 F (36.9 C)  Recent Labs  Lab 10/06/22 0520 10/08/22 0638 10/09/22 0638  WBC 7.8  --  8.6  CREATININE 0.71 0.64 0.73    Estimated Creatinine Clearance: 40.9 mL/min (by C-G formula based on SCr of 0.73 mg/dL).    Allergies  Allergen Reactions   Aspirin Nausea Only    Antimicrobials this admission: Vancomycin 11/19 >>  Fluconazole 11/14 >> 11/21  Dose adjustments this admission: None  Microbiology results: 11/16 WoundCx: Rare S. Aureus & S. Epidermis 11/16 UCx: Insignificant growth  11/16 MRSA PCR: Negative  Thank you for allowing pharmacy to be a part of this patient's care.  [place] Intern 10/12/2022 8:41 AM

## 2022-10-12 NOTE — Plan of Care (Signed)
  Problem: Consults Goal: RH GENERAL PATIENT EDUCATION Description: See Patient Education module for education specifics. Outcome: Progressing   Problem: RH BOWEL ELIMINATION Goal: RH STG MANAGE BOWEL WITH ASSISTANCE Description: STG Manage Bowel with mod i Assistance. Outcome: Progressing Goal: RH STG MANAGE BOWEL W/MEDICATION W/ASSISTANCE Description: STG Manage Bowel with Medication with mod I  Assistance. Outcome: Progressing   Problem: RH BLADDER ELIMINATION Goal: RH STG MANAGE BLADDER WITH ASSISTANCE Description: STG Manage Bladder With toileting Assistance Outcome: Progressing   Problem: RH SAFETY Goal: RH STG ADHERE TO SAFETY PRECAUTIONS W/ASSISTANCE/DEVICE Description: STG Adhere to Safety Precautions With Assistance/Device. Outcome: Progressing   Problem: RH PAIN MANAGEMENT Goal: RH STG PAIN MANAGED AT OR BELOW PT'S PAIN GOAL Description: < 4 with prns Outcome: Progressing   Problem: RH KNOWLEDGE DEFICIT GENERAL Goal: RH STG INCREASE KNOWLEDGE OF SELF CARE AFTER HOSPITALIZATION Description: Patient and family will be able to manage care at discharge using educational handouts independently Outcome: Progressing   Problem: Education: Goal: Knowledge of General Education information will improve Description: Including pain rating scale, medication(s)/side effects and non-pharmacologic comfort measures Outcome: Progressing   Problem: Health Behavior/Discharge Planning: Goal: Ability to manage health-related needs will improve Outcome: Progressing   Problem: Clinical Measurements: Goal: Ability to maintain clinical measurements within normal limits will improve Outcome: Progressing Goal: Will remain free from infection Outcome: Progressing Goal: Diagnostic test results will improve Outcome: Progressing Goal: Respiratory complications will improve Outcome: Progressing Goal: Cardiovascular complication will be avoided Outcome: Progressing   Problem:  Activity: Goal: Risk for activity intolerance will decrease Outcome: Progressing   Problem: Nutrition: Goal: Adequate nutrition will be maintained Outcome: Progressing   Problem: Coping: Goal: Level of anxiety will decrease Outcome: Progressing   Problem: Elimination: Goal: Will not experience complications related to bowel motility Outcome: Progressing Goal: Will not experience complications related to urinary retention Outcome: Progressing   Problem: Pain Managment: Goal: General experience of comfort will improve Outcome: Progressing   Problem: Safety: Goal: Ability to remain free from injury will improve Outcome: Progressing

## 2022-10-13 ENCOUNTER — Inpatient Hospital Stay (HOSPITAL_COMMUNITY): Payer: Medicare Other

## 2022-10-13 LAB — BASIC METABOLIC PANEL
Anion gap: 7 (ref 5–15)
BUN: 9 mg/dL (ref 8–23)
CO2: 26 mmol/L (ref 22–32)
Calcium: 8.3 mg/dL — ABNORMAL LOW (ref 8.9–10.3)
Chloride: 98 mmol/L (ref 98–111)
Creatinine, Ser: 0.72 mg/dL (ref 0.44–1.00)
GFR, Estimated: >60 mL/min (ref >60)
Glucose, Bld: 116 mg/dL — ABNORMAL HIGH (ref 70–99)
Potassium: 3.8 mmol/L (ref 3.5–5.1)
Sodium: 131 mmol/L — ABNORMAL LOW (ref 135–145)

## 2022-10-13 LAB — CBC WITH DIFFERENTIAL/PLATELET
Abs Immature Granulocytes: 0 10*3/uL (ref 0.00–0.07)
Basophils Absolute: 0.1 10*3/uL (ref 0.0–0.1)
Basophils Relative: 2 %
Eosinophils Absolute: 0.1 10*3/uL (ref 0.0–0.5)
Eosinophils Relative: 2 %
HCT: 19 % — ABNORMAL LOW (ref 36.0–46.0)
Hemoglobin: 6.4 g/dL — CL (ref 12.0–15.0)
Lymphocytes Relative: 14 %
Lymphs Abs: 1 10*3/uL (ref 0.7–4.0)
MCH: 37.4 pg — ABNORMAL HIGH (ref 26.0–34.0)
MCHC: 33.7 g/dL (ref 30.0–36.0)
MCV: 111.1 fL — ABNORMAL HIGH (ref 80.0–100.0)
Monocytes Absolute: 0.3 10*3/uL (ref 0.1–1.0)
Monocytes Relative: 5 %
Neutro Abs: 5.2 10*3/uL (ref 1.7–7.7)
Neutrophils Relative %: 77 %
Platelets: 276 10*3/uL (ref 150–400)
RBC: 1.71 MIL/uL — ABNORMAL LOW (ref 3.87–5.11)
RDW: 19.4 % — ABNORMAL HIGH (ref 11.5–15.5)
WBC: 6.8 10*3/uL (ref 4.0–10.5)
nRBC: 0 % (ref 0.0–0.2)
nRBC: 0 /100 WBC

## 2022-10-13 LAB — PREPARE RBC (CROSSMATCH)

## 2022-10-13 LAB — AEROBIC CULTURE W GRAM STAIN (SUPERFICIAL SPECIMEN): Gram Stain: NONE SEEN

## 2022-10-13 LAB — ABO/RH: ABO/RH(D): A POS

## 2022-10-13 LAB — MAGNESIUM: Magnesium: 1.8 mg/dL (ref 1.7–2.4)

## 2022-10-13 MED ORDER — CHLORHEXIDINE GLUCONATE CLOTH 2 % EX PADS
6.0000 | MEDICATED_PAD | Freq: Every day | CUTANEOUS | Status: DC
  Administered 2022-10-13 – 2022-10-17 (×7): 6 via TOPICAL

## 2022-10-13 MED ORDER — SODIUM CHLORIDE 0.9% FLUSH
10.0000 mL | INTRAVENOUS | Status: DC | PRN
  Administered 2022-10-22 – 2022-10-26 (×5): 10 mL

## 2022-10-13 NOTE — Progress Notes (Signed)
Orthopedic Surgery Post-operative Note   Assessment: Patient is a 88 y.o. female with left intertrochanteric femur fracture status post IMN, developed persistent drainage and now s/p I&D   Plan: -Operative plans: complete -Infectious disease consulted, appreciate recs -Keep dressing dry and in place at all times -Will follow intra-operative cultures (staph species) -Diet: regular -DVT ppx: xarelto -Antibiotics: vancomycin -Weight bearing status: as tolerated -PT/OT evaluate and treat -Pain control -Dispo: per primary  ___________________________________________________________________________  Subjective: No acute events overnight. Has been started on vancomycin for intra-operative cultures. Hip ain continues to improve. Denies paresthesias and numbness.    Physical Exam:  General: no acute distress, laying in bed, appears stated age Respiratory: unlabored breathing, symmetric chest rise Neurologic: alert, orientated, following commands   MSK:   -Left lower extremity  Dressings with small amount of blood otherwise c/d/I (no change in drainage on dressing from prior days) EHL/TA/GSC intact Sensation intact to light touch in sural/saphenous/deep peroneal/superficial peroneal/tibial nerve distributions Foot warm and well perfused   Patient name: Anna Weiss Patient MRN: [id number] Date: 10/13/22

## 2022-10-13 NOTE — Progress Notes (Signed)
PROGRESS NOTE   Subjective/Complaints:  Pt reports mouth dry- hasn't drank this AM Hurts whenever moves.  Crick in neck last 2 days when wakes up.  LBM last night On IV VAnc  ROS:  Pt denies SOB, abd pain, CP, N/V/C/D, and vision changes   Except per HPI   Objective:   [place] EKG SITE RITE  Result Date: 10/12/2022 If Site Rite image not attached, placement could not be confirmed due to current cardiac rhythm.  Recent Labs    10/13/22 0714  WBC 6.8  HGB 6.4*  HCT 19.0*  PLT 276    Recent Labs    10/13/22 0714  NA 131*  K 3.8  CL 98  CO2 26  GLUCOSE 116*  BUN 9  CREATININE 0.72  CALCIUM 8.3*    Intake/Output Summary (Last 24 hours) at 10/13/2022 1058 Last data filed at 10/13/2022 0929 Gross per 24 hour  Intake 645.29 ml  Output 450 ml  Net 195.29 ml        Physical Exam: Vital Signs Blood pressure 97/61, pulse 67, temperature 98.6 F (37 C), temperature source Oral, resp. rate 16, height '5\' 7"'$  (1.702 m), weight 72.7 kg, SpO2 94 %.       General: awake, alert, appropriate, talking more than Friday; appears pale, almost waxen; NAD HENT: conjugate gaze; oropharynx moist- cheeks have color, but rest of her very pale; including conjunctivae CV: regular rate; no JVD Pulmonary: CTA B/L; no W/R/R- good air movement GI: soft, NT, ND, (+)BS- normoactive Psychiatric: appropriate Neurological: Ox3 Skin- has surgical dressing in place with a little dried blood on dressing- can see without removing dressing Musculoskeletal:     Cervical back: Normal range of motion.     Comments: Left hip tender, still swollen - bruised- mainly yellow-  much better- foam dressing over L hip C/D/I Skin: L hip less swelling; dressing C/D/I    General: Skin is warm.     Findings: Bruising (arms) present.     Comments: Left hip incision CDI with dry dressing- no drainage through dressing  Left ear as above. L ear we've  removed bolster- still really bruised  Neurological:     Mental Status: She is alert and oriented to person, place, and time.     Cranial Nerves: No cranial nerve deficit.     Sensory: No sensory deficit.     Comments: Motor: 4-5/5 UE's. RLE 3/5 prox to 5/5 distal. LLE 1-2/5 (pain) to 4/5 distally. DTR's 1+, no abnl tone or ataxia   Assessment/Plan: 1. Functional deficits which require 3+ hours per day of interdisciplinary therapy in a comprehensive inpatient rehab setting. Physiatrist is providing close team supervision and 24 hour management of active medical problems listed below. Physiatrist and rehab team continue to assess barriers to discharge/monitor patient progress toward functional and medical goals  Care Tool:  Bathing    Body parts bathed by patient: Right arm, Left arm, Chest, Abdomen, Front perineal area, Right upper leg, Face, Left upper leg   Body parts bathed by helper: Left lower leg, Right lower leg, Buttocks     Bathing assist Assist Level: Moderate Assistance - Patient 50 - 74%  Upper Body Dressing/Undressing Upper body dressing   What is the patient wearing?: Pull over shirt    Upper body assist Assist Level: Set up assist    Lower Body Dressing/Undressing Lower body dressing      What is the patient wearing?: Incontinence brief, Pants     Lower body assist Assist for lower body dressing: Minimal Assistance - Patient > 75% (use of reacher and RW)     Toileting Toileting    Toileting assist Assist for toileting: Moderate Assistance - Patient 50 - 74%     Transfers Chair/bed transfer  Transfers assist  Chair/bed transfer activity did not occur: Safety/medical concerns (dizziness, pain, decreased balance)  Chair/bed transfer assist level: Minimal Assistance - Patient > 75%     Locomotion Ambulation   Ambulation assist   Ambulation activity did not occur: Safety/medical concerns (dizziness, pain, decreased balance)  Assist level:  Minimal Assistance - Patient > 75% Assistive device: Walker-rolling Max distance: 38   Walk 10 feet activity   Assist  Walk 10 feet activity did not occur: Safety/medical concerns (dizziness, pain, decreased balance)  Assist level: Minimal Assistance - Patient > 75% Assistive device: Walker-rolling   Walk 50 feet activity   Assist Walk 50 feet with 2 turns activity did not occur: Safety/medical concerns         Walk 150 feet activity   Assist Walk 150 feet activity did not occur: Safety/medical concerns         Walk 10 feet on uneven surface  activity   Assist Walk 10 feet on uneven surfaces activity did not occur: Safety/medical concerns (dizziness, pain, decreased balance)         Wheelchair     Assist Is the patient using a wheelchair?: Yes Type of Wheelchair: Manual Wheelchair activity did not occur: Safety/medical concerns (dizziness, pain, decreased balance)         Wheelchair 50 feet with 2 turns activity    Assist    Wheelchair 50 feet with 2 turns activity did not occur: Safety/medical concerns (dizziness, pain, decreased balance)       Wheelchair 150 feet activity     Assist  Wheelchair 150 feet activity did not occur: Safety/medical concerns (dizziness, pain, decreased balance)       Blood pressure 97/61, pulse 67, temperature 98.6 F (37 C), temperature source Oral, resp. rate 16, height '5\' 7"'$  (1.702 m), weight 72.7 kg, SpO2 94 %.  Medical Problem List and Plan: 1. Functional deficits secondary to left intertrochanteric femur fx after fall. Pt s/p IMN 09/25/22             -LLE WBAT             -patient may shower             -ELOS/Goals: 15-20 days  D/c set for 11/21  Con't CIR- PT and OT- will see when needs to d/c since had another surgery 2.  Antithrombotics: -DVT/anticoagulation:  xarelto '10mg'$  daily for 14 days             -antiplatelet therapy:  resume plavix '75mg'$  daily 3. Pain Management: tylenol and hydrocodone  prn for pain             -robaxin prn for muscle spasms  11/8- pain limiting therapy- doesn't want opiates- mainly using tylenol-   11/9- d/w pt- she's willing to try tramadol 50 mg q6 hours prn- she's scared of Coding if takes Norco- I explained that Tramadol is mild and  shouldn't cause any cognitive issues.   11/10- pain much better- not resolved, but better with tramadol- con't tramadol and tylenol prn  11/14- pain still doing better with tramadol  11/17- is worse today since had surgery yesterday-doesn't want any other meds  11/18 reports pain controlled, continue tramadol PRN 4. Mood/Behavior/Sleep: team to provide ego support             -antipsychotic agents: n/a 5. Neuropsych/cognition: This patient is capable of making decisions on her own behalf. 6. Skin/Wound Care: dry dressings to left ear and hip  11/9- having some drainage- from L hip- is improved this Am- will monitor closely.  7. Fluids/Electrolytes/Nutrition: encourage appropriate PO             -pt's intake has inconsistent so far 8. Nausea:              -might be positional or med related.              -also having associated gas             -pt told me she's been moving bowels, however I don't see a bm recorded             -will try sorbitol today, dulcolax suppository prn             -protonix 11/8- has ileus- per #15 below  11/9- still having nausea- will change diet to clear liquid per pt request- and monitor  11/10- on clear liquid diet- pt wants to wait 1 more day before changing to regular diet- if not better tomorrow, will need to call GI  11/14- nausea resolved- back on regular diet  11/16- nauseated again  111/7- Put on IVFs for another 24 hours at 75cc/hour since had surgery at 10-11pm at night and didn't get back from PACU until 1am- sh'e sleepy and not taking in much  11/18 LBM today, improved continue to monitor 11/20- LBM last night 9. Left lower lobe consolidation             -pneumonia ruled out.  Likely atelectasis             -IS, OOB             -wean oxygen to off  11/7- will write order to wean O2.  10. Sinus bradycardia:             -on propranolol and verapamil at home             -continue to hold as HR in 50's  11/14- since still in 50s, will not restart  11. Essential HTN             -continue hctz  11/14- BP usually controlled but up today- will monitor for trend  11/15- somewhat elevated, but not sure if dropping- see #21           11/16- started hydralazine  11/17- just got hydralazine- is slightly better- might need it increased over weekend  -11/19 good control overall continue to monitor  11/20- somewhat variable- due to ABLA likely Vitals:   10/12/22 2030 10/13/22 0534  BP: (!) 153/47 97/61  Pulse: (!) 58 67  Resp: 16 16  Temp: 98.4 F (36.9 C) 98.6 F (37 C)  SpO2: 98% 94%    12. Right middle lobe nodule 2.3 cm- should be considered primary lung CA until ruled out- also has nodule as well 10m on R middle lung- could be mets per  CT scan.              -f/u as outpt  11/7- d/w daughter- she said no f/u at this time- due to pt's age.   11/8- daughter doesn't want to let pt know about tumors.   13. Mild leukocytosis- 11.2   11/6- afebrile- other than difficulty swallowing- says it's dry?; denies UTI Sx's- if gets worse clinically, will recheck in AM- right now scheduled for Wed AM.   11/8- WBC down to 9.4-  14. Constipation  11/6- LBM on day of fall per pt and chart- will give Sorbitol after therapy and follow with SSE if needed.   11/7- had multiple BM's- didn't need SSE- per notes- feels cleaned out   11/19 LBM today, improved 15. ABLA  11/6- Down to 7.6- will recheck Wednesday to make sure not dropping further- and transfuse if required.   11/8- Hb 7.3- on edge of needing transfusion.   11/9- Hb up to 8.0-   11/10- Hb 7.7- will con't to monitor closely.   11/15- Hb 8.4  11/20- Hb 6.4- will transfuse as below 16. Post op ileus resolved BM x 3 11/10,  1 BM 11/12- advance diet as tolerated   -Recheck level tomorrow 17. Thrush?  11/9- will add Magic mouthwash- don't see plaques, but tongue dry and furrowed. Says things taste 'wierd".   11/10- said magic mouthwash helping Sx's.  18. Leukocytosis improved -   11/15- WBC down to 7.8  11/16- will recheck today to make sure not elevated  11/17- WBC stable at 8k  Recheck tomorrow  11/20- WBC down to 6.8    Latest Ref Rng & Units 10/13/2022    7:14 AM 10/09/2022    6:38 AM 10/06/2022    5:20 AM  CBC  WBC 4.0 - 10.5 K/uL 6.8  8.6  7.8   Hemoglobin 12.0 - 15.0 g/dL 6.4  8.2  8.4   Hematocrit 36.0 - 46.0 % 19.0  24.6  24.1   Platelets 150 - 400 K/uL 276  336  346     19. Hypokalemia and low Mg  11/10- repleted up to 3.5- will put on Kcl 10 mEq daily for now and recheck Monday- since K+ still  3.5- will also check Mg Monday as well   11/14= Mg 1.6 and K+ 3.1- repleted again  yesterday by pharmacy for [place]- will recheck labs iN AM  11/15- K+ 4.2- will give IV Mg 2G for Mg 1.6- and recheck in AM  11/16- Mg 1.7- still low- will put on PO Mg 400 mg BID  Recheck K+ and MG tomorrow  11/20- K+ 3.8 and Mg 1.8- will con't regimen 20. Swallowing issues  11/14- appears to be thrush- will start Diflucan- due to renal issues, will need to be 50 mg daily- x 7 days doses-   11/15- reports things taste better and was able ot swallow some this AM of breakfast-con't diflucan  11/19 reports improved, continue to monitor 21. Dizziness/lightheadedness/presyncope  11/15- will have pt get orthostatics checked by nursing and therapy- BP on high side, so not sure if Sx's due to BP being high or dropping.   11/16- added hydralazine 25 mg TID- is somewhat better today  11/20- BP a little on low side- likely due to ABLA- will hold BP meds if need be.  22. Infection of L hip?  11/16- still having drainage from L hip- spoke to Ortho- they are taking her to OR today at 5pm for wash  out-   11/17- Gram stains negative  so far- waiting for Cx's- surgeon gave Ancef x2 IV and will monitor Cultures  11/19 wound culture growing staph,Continue vancomycin, surgery advising ID consult, called ID to discuss -consult placed  11/20- con't IV Vanc per ID- Cx's still pending final.  23. Urinary retention  11/16- is new- will check U/A and Cx- and labs- CBC- with diff and CMP just to make sure no other issues  11/17- U/A (-) and doesn't have elevated WBC- mild left shift however- will start Flomax 0.4 mg q supper  11/8 continent PVRs a little improved today< 373m, continue to monitor, continue flomax, IC if > 350 ml 24. ABLA anemia-  11/20- down to 6.4- will transfuse 2 units since so low.    I spent a total of 41   minutes on total care today- >50% coordination of care- due to d/w PA and nursing and PT about Hb- Hb is 6.4- will transfuse.   LOS: 15 days A FACE TO FACE EVALUATION WAS PERFORMED  Anna Weiss 10/13/2022, 10:58 AM

## 2022-10-13 NOTE — Progress Notes (Signed)
Consent obtained, blood product administered as ordered. RN with pt at this time.   Pt saturating 89% on room air. 2L nasal cannula placed as ordered.

## 2022-10-13 NOTE — Progress Notes (Signed)
Peripherally Inserted Central Catheter Placement  The IV Nurse has discussed with the patient and/or persons authorized to consent for the patient, the purpose of this procedure and the potential benefits and risks involved with this procedure.  The benefits include less needle sticks, lab draws from the catheter, and the patient may be discharged home with the catheter. Risks include, but not limited to, infection, bleeding, blood clot (thrombus formation), and puncture of an artery; nerve damage and irregular heartbeat and possibility to perform a PICC exchange if needed/ordered by physician.  Alternatives to this procedure were also discussed.  Bard Power PICC patient education guide, fact sheet on infection prevention and patient information card has been provided to patient /or left at bedside.    PICC Placement Documentation  PICC Single Lumen 29/56/21 Right Basilic 38 cm 0 cm (Active)  Indication for Insertion or Continuance of Line Home intravenous therapies (PICC only) 10/13/22 1635  Exposed Catheter (cm) 0 cm 10/13/22 1635  Site Assessment Clean, Dry, Intact 10/13/22 1635  Line Status Flushed;Saline locked;Blood return noted 10/13/22 1635  Dressing Type Transparent;Securing device 10/13/22 1635  Dressing Status Antimicrobial disc in place 10/13/22 1635  Safety Lock Not Applicable 30/86/57 8469  Dressing Change Due 10/20/22 10/13/22 1635   Bruised area noted directly below the insertion site prior to placing the PICC    Frances Maywood 10/13/2022, 4:38 PM

## 2022-10-13 NOTE — Progress Notes (Addendum)
Occupational Therapy Session Note  Patient Details  Name: Anna Weiss MRN: [id number] Date of Birth: 21-Feb-1925  Today's Date: 10/13/2022 OT Missed Time: 29 Minutes Missed Time Reason: MD hold (comment)   Short Term Goals: Week 2:  OT Short Term Goal 1 (Week 2): STG=LTGs 2/2 LOS  Skilled Therapeutic Interventions/Progress Updates:  Pt missed 45 minutes of skilled OT intervention due to medical hold.   Therapy Documentation Precautions:  Precautions Precautions: Fall, Other (comment) Precaution Comments: monitor O2, orthostatics Restrictions Weight Bearing Restrictions: Yes LLE Weight Bearing: Weight bearing as tolerated    Therapy/Group: Individual Therapy  Maudie Mercury, OTR/L, MSOT  10/13/2022, 3:11 PM

## 2022-10-13 NOTE — Progress Notes (Signed)
Occupational Therapy Progress Note  Patient Details  Name: Anna Weiss MRN: [id number] Date of Birth: Apr 17, 1925    Today's Date: 10/13/2022 OT Missed Time: 33 Minutes Missed Time Reason: MD hold (comment) (MD placed medical hold due to low H&H. Plan for blood transfusion today.)  2nd visit Missed minutes documented by August Saucer OT.   [name] 10/13/2022, 7:43 AM

## 2022-10-13 NOTE — Progress Notes (Signed)
Occupational Therapy Note  Patient Details  Name: JANEECE BLOK MRN: [id number] Date of Birth: 09-28-1925  Today's Date: 10/13/2022 OT Missed Time: 63 Minutes Missed Time Reason: MD hold (comment) (MD placed medical hold due to low H&H. Plan for blood transfusion today.)    Ailene Ravel, OTR/L,CBIS  Supplemental OT - [facility]  10/13/2022, 10:19 AM

## 2022-10-13 NOTE — Progress Notes (Signed)
Physical Therapy Session Note  Patient Details  Name: DEZIREE MOKRY MRN: [id number] Date of Birth: 05/03/1925  Today's Date: 10/13/2022 PT Missed Time: 27 Minutes Missed Time Reason: MD hold (Comment) (low H&H)  Short Term Goals: Week 2:  PT Short Term Goal 1 (Week 2): STG=LTG due to ELOS  Skilled Therapeutic Interventions/Progress Updates:      Therapy Documentation Precautions:  Precautions Precautions: Fall, Other (comment) Precaution Comments: monitor O2, orthostatics Restrictions Weight Bearing Restrictions: Yes LLE Weight Bearing: Weight bearing as tolerated   Pt missed 60 minutes of skilled PT due to medical hold for low H&H, plan to make up as able.   Therapy/Group: Individual Therapy  [name] [name] PT, DPT  10/13/2022, 7:43 AM

## 2022-10-13 NOTE — Progress Notes (Signed)
Physical Therapy Note  Patient Details  Name: Anna Weiss MRN: [id number] Date of Birth: 20-Nov-1925 Today's Date: 10/13/2022    Pt on hold for critical value HGB.  Pt receiving transfusion at this time.  Missed time 45 min.   [name] 10/13/2022, 1:08 PM

## 2022-10-13 NOTE — Progress Notes (Signed)
Latest Reference Range & Units 10/13/22 07:14  Hemoglobin 12.0 - 15.0 g/dL 6.4 (LL)  (LL): Data is critically low    Critical lab result phoned to [place]. No new orders at this time. Sheela Stack, LPN

## 2022-10-13 NOTE — Progress Notes (Signed)
ID PROGRESS NOTE 89 YF admitted to inpatient rehab following a fall sustaining left  intertrochanteric femur fracture status post open reduction of left intertrochanteric femur fracture with intramedullary nail placement on 09/25/22. Now with MRSA wound  infection from Left IMN s/p I x D  Cx showing MRSA and staph epi   Had picc line placed without difficulty  Plan: - plan to change to daptomycin, less nephrotoxicity - will check for daptomycin sensitivities on MRSA isolate - will check baseline CK. - plan for 4 wk since deep tissue infection, not necessary osteomyelitis  Brendin Situ B. [facility] for Infectious Diseases [phone]

## 2022-10-14 ENCOUNTER — Inpatient Hospital Stay (HOSPITAL_COMMUNITY): Payer: Medicare Other

## 2022-10-14 LAB — CBC WITH DIFFERENTIAL/PLATELET
Abs Immature Granulocytes: 0.03 10*3/uL (ref 0.00–0.07)
Basophils Absolute: 0 10*3/uL (ref 0.0–0.1)
Basophils Relative: 0 %
Eosinophils Absolute: 0.1 10*3/uL (ref 0.0–0.5)
Eosinophils Relative: 1 %
HCT: 27.1 % — ABNORMAL LOW (ref 36.0–46.0)
Hemoglobin: 9.6 g/dL — ABNORMAL LOW (ref 12.0–15.0)
Immature Granulocytes: 0 %
Lymphocytes Relative: 13 %
Lymphs Abs: 1.1 10*3/uL (ref 0.7–4.0)
MCH: 36 pg — ABNORMAL HIGH (ref 26.0–34.0)
MCHC: 35.4 g/dL (ref 30.0–36.0)
MCV: 101.5 fL — ABNORMAL HIGH (ref 80.0–100.0)
Monocytes Absolute: 1 10*3/uL (ref 0.1–1.0)
Monocytes Relative: 13 %
Neutro Abs: 6 10*3/uL (ref 1.7–7.7)
Neutrophils Relative %: 73 %
Platelets: 270 10*3/uL (ref 150–400)
RBC: 2.67 MIL/uL — ABNORMAL LOW (ref 3.87–5.11)
RDW: 22.5 % — ABNORMAL HIGH (ref 11.5–15.5)
WBC: 8.3 10*3/uL (ref 4.0–10.5)
nRBC: 0 % (ref 0.0–0.2)

## 2022-10-14 LAB — TYPE AND SCREEN
ABO/RH(D): A POS
Antibody Screen: NEGATIVE
Unit division: 0
Unit division: 0

## 2022-10-14 LAB — BASIC METABOLIC PANEL
Anion gap: 12 (ref 5–15)
BUN: 8 mg/dL (ref 8–23)
CO2: 30 mmol/L (ref 22–32)
Calcium: 8.5 mg/dL — ABNORMAL LOW (ref 8.9–10.3)
Chloride: 88 mmol/L — ABNORMAL LOW (ref 98–111)
Creatinine, Ser: 0.59 mg/dL (ref 0.44–1.00)
GFR, Estimated: >60 mL/min (ref >60)
Glucose, Bld: 113 mg/dL — ABNORMAL HIGH (ref 70–99)
Potassium: 3 mmol/L — ABNORMAL LOW (ref 3.5–5.1)
Sodium: 130 mmol/L — ABNORMAL LOW (ref 135–145)

## 2022-10-14 LAB — BPAM RBC
Blood Product Expiration Date: 202311262359
Blood Product Expiration Date: 202311272359
ISSUE DATE / TIME: 202311201240
ISSUE DATE / TIME: 202311201705
Unit Type and Rh: 600
Unit Type and Rh: 6200

## 2022-10-14 LAB — GLUCOSE, CAPILLARY: Glucose-Capillary: 125 mg/dL — ABNORMAL HIGH (ref 70–99)

## 2022-10-14 LAB — CK: Total CK: 23 U/L — ABNORMAL LOW (ref 38–234)

## 2022-10-14 MED ORDER — SODIUM CHLORIDE 0.9 % IV SOLN
600.0000 mg | Freq: Every day | INTRAVENOUS | Status: DC
  Administered 2022-10-14 – 2022-10-19 (×6): 600 mg via INTRAVENOUS
  Filled 2022-10-14 (×7): qty 12

## 2022-10-14 MED ORDER — POTASSIUM CHLORIDE 10 MEQ/50ML IV SOLN
10.0000 meq | INTRAVENOUS | Status: AC
  Administered 2022-10-14 (×5): 10 meq via INTRAVENOUS
  Filled 2022-10-14 (×6): qty 50

## 2022-10-14 MED ORDER — ALBUTEROL SULFATE (2.5 MG/3ML) 0.083% IN NEBU
2.5000 mg | INHALATION_SOLUTION | RESPIRATORY_TRACT | Status: DC | PRN
  Administered 2022-10-17 – 2022-10-20 (×5): 2.5 mg via RESPIRATORY_TRACT
  Filled 2022-10-14 (×5): qty 3

## 2022-10-14 MED ORDER — ONDANSETRON HCL 4 MG/2ML IJ SOLN
4.0000 mg | Freq: Once | INTRAMUSCULAR | Status: AC
  Administered 2022-10-14: 4 mg via INTRAVENOUS
  Filled 2022-10-14: qty 2

## 2022-10-14 MED ORDER — FUROSEMIDE 10 MG/ML IJ SOLN
20.0000 mg | Freq: Once | INTRAMUSCULAR | Status: AC
  Administered 2022-10-14: 20 mg via INTRAVENOUS
  Filled 2022-10-14: qty 2

## 2022-10-14 MED ORDER — FUROSEMIDE 10 MG/ML IJ SOLN
40.0000 mg | Freq: Once | INTRAMUSCULAR | Status: AC
  Administered 2022-10-14: 40 mg via INTRAVENOUS

## 2022-10-14 MED ORDER — PROMETHAZINE HCL 12.5 MG RE SUPP: 12.5000 mg | Freq: Four times a day (QID) | RECTAL | Status: DC | PRN

## 2022-10-14 MED ORDER — APIXABAN 2.5 MG PO TABS
2.5000 mg | ORAL_TABLET | Freq: Two times a day (BID) | ORAL | Status: DC
  Administered 2022-10-14 – 2022-10-18 (×9): 2.5 mg via ORAL
  Filled 2022-10-14 (×10): qty 1

## 2022-10-14 MED ORDER — SODIUM CHLORIDE 0.9 % IV SOLN: INTRAVENOUS | Status: AC

## 2022-10-14 MED ORDER — FUROSEMIDE 10 MG/ML IJ SOLN
INTRAMUSCULAR | Status: AC
  Filled 2022-10-14: qty 4

## 2022-10-14 MED ORDER — PROCHLORPERAZINE EDISYLATE 10 MG/2ML IJ SOLN
10.0000 mg | Freq: Four times a day (QID) | INTRAMUSCULAR | Status: DC | PRN
  Administered 2022-10-14: 10 mg via INTRAVENOUS
  Filled 2022-10-14: qty 2

## 2022-10-14 MED ORDER — PROMETHAZINE HCL 12.5 MG PO TABS
12.5000 mg | ORAL_TABLET | Freq: Four times a day (QID) | ORAL | Status: DC | PRN
  Administered 2022-10-16: 12.5 mg via ORAL
  Filled 2022-10-14 (×2): qty 1

## 2022-10-14 NOTE — Progress Notes (Signed)
[facility] for Infectious Disease    Date of Admission:  09/28/2022   Total days of antibiotics :5 but day 3 of vancomycin   ID: Anna Weiss is a 88 y.o. female with  with left intertrochanteric femur fracture status post IMN (09/25/2022), developed persistent drainage and now s/p I&D (10/09/2022) OR cultures growing MRSA and staph epi Principal Problem:   Fracture, intertrochanteric, left femur, sequela Active Problems:   Drainage from wound    Subjective: Afebrile. Denies cough. Still some pain to her left hip.had 2 unit RBC transfusion since hgb 6.4  Tolerated picc line without difficulty C/o sob with chest xray showing effusion and patchy infiltrate   Medications:   apixaban  2.5 mg Oral BID   Chlorhexidine Gluconate Cloth  6 each Topical Daily   clopidogrel  75 mg Oral Daily   feeding supplement  237 mL Oral BID BM   furosemide       hydrALAZINE  25 mg Oral Q8H   hydrochlorothiazide  25 mg Oral Daily   levothyroxine  50 mcg Oral Q0600   magic mouthwash  10 mL Oral QID   magnesium oxide  400 mg Oral BID   methocarbamol  250 mg Oral TID   multivitamin with minerals  1 tablet Oral Daily   mupirocin ointment  1 Application Nasal BID   pantoprazole  40 mg Oral BID   polyvinyl alcohol  1 drop Both Eyes BID   potassium chloride  10 mEq Oral Daily   simvastatin  10 mg Oral QPM   tamsulosin  0.4 mg Oral QPC supper    Objective: Vital signs in last 24 hours: Temp:  [97.7 F (36.5 C)-98.6 F (37 C)] 97.7 F (36.5 C) (11/21 1425) Pulse Rate:  [53-74] 53 (11/21 1425) Resp:  [14-20] 17 (11/21 1425) BP: (133-173)/(47-58) 150/54 (11/21 1425) SpO2:  [90 %-100 %] 98 % (11/21 1425) Weight:  [68.7 kg] 68.7 kg (11/21 0600)  Physical Exam  Constitutional:  oriented to person, place, and time. appears well-developed and well-nourished. No distress.  HENT: [place]/AT, PERRLA, no scleral icterus. Bruising to left ear Mouth/Throat: Oropharynx is clear and moist. No  oropharyngeal exudate.  Cardiovascular: Normal rate, regular rhythm and normal heart sounds. Exam reveals no gallop and no friction rub.  No murmur heard.  Pulmonary/Chest: Effort normal and breath sounds normal. Decreased Bs at bases Neck = supple, no nuchal rigidity Abdominal: Soft. Bowel sounds are normal.  exhibits no distension. There is no tenderness. Left hip bandaged Lymphadenopathy: no cervical adenopathy. No axillary adenopathy Neurological: alert and oriented to person, place, and time.  Skin: Skin is warm and dry. No rash noted. No erythema.  Psychiatric: a normal mood and affect.  behavior is normal.    Lab Results Recent Labs    10/13/22 0714 10/14/22 1425  WBC 6.8 8.3  HGB 6.4* 9.6*  HCT 19.0* 27.1*  NA 131* 130*  K 3.8 3.0*  CL 98 88*  CO2 26 30  BUN 9 8  CREATININE 0.72 0.59   MICRO:  Reviewed-staph aureus and staph epi        Staphylococcus aureus      MIC    CIPROFLOXACIN >=8 RESISTANT Resistant    CLINDAMYCIN <=0.25 SENS... Sensitive    ERYTHROMYCIN >=8 RESISTANT Resistant    GENTAMICIN <=0.5 SENSI... Sensitive    Inducible Clindamycin NEGATIVE Sensitive    OXACILLIN >=4 RESISTANT Resistant    RIFAMPIN <=0.5 SENSI... Sensitive    TETRACYCLINE <=1 SENSITIVE  Sensitive    TRIMETH/SULFA >=320 RESIS... Resistant    VANCOMYCIN 1 SENSITIVE Sensitive     Studies/Results: DG Chest 2 View  Result Date: 10/14/2022 CLINICAL DATA:  Shortness of breath.  Nausea and vomiting today. EXAM: CHEST - 2 VIEW COMPARISON:  Radiographs 10/13/2022 and 10/01/2022.  CT 09/24/2022. FINDINGS: 0957 hours. The heart size and mediastinal contours are stable with aortic atherosclerosis. A right arm PICC projects to the lower SVC level. There are persistent left greater than right pleural effusions with associated bibasilar airspace opacities. Component in the left perihilar region appears mildly progressive. Grossly stable underlying right middle lobe pulmonary nodule as  correlated with prior CT. No acute osseous findings. IMPRESSION: 1. Persistent bibasilar airspace opacities and pleural effusions suspicious for multilobar pneumonia. Left perihilar component appears slightly worse. 2. Underlying right middle lobe nodule as seen on prior chest CT, potentially malignant. Electronically Signed   By: Richardean Sale M.D.   On: 10/14/2022 10:44   DG Abd 1 View  Result Date: 10/14/2022 CLINICAL DATA:  Nausea and vomiting EXAM: ABDOMEN - 1 VIEW COMPARISON:  Radiograph 10/02/2022 FINDINGS: There is mild gas distention of bowel in a nonobstructive pattern. Right upper quadrant calcifications compatible with cholelithiasis. Pelvic phleboliths. IMPRESSION: Mild gaseous distension of bowel in a nonobstructive pattern. Electronically Signed   By: Maurine Simmering M.D.   On: 10/14/2022 10:40   DG CHEST PORT 1 VIEW  Result Date: 10/13/2022 CLINICAL DATA:  PICC line placement EXAM: PORTABLE CHEST 1 VIEW COMPARISON:  Portable exam 1653 hours compared to 10/01/2022 FINDINGS: RIGHT arm PICC line tip projects over SVC. Enlargement of cardiac silhouette. Atherosclerotic calcification aorta. Bibasilar effusions and atelectasis, greater on LEFT. Cannot exclude consolidation in LEFT lower lobe. Upper lungs clear. No pneumothorax. Osseous demineralization. Atherosclerotic calcifications at carotid bifurcations. IMPRESSION: Bibasilar effusions and atelectasis greater on LEFT; cannot exclude LEFT lower lobe pneumonia. Tip of RIGHT arm PICC line projects over SVC. Electronically Signed   By: Lavonia Dana M.D.   On: 10/13/2022 17:07   [place] EKG SITE RITE  Result Date: 10/12/2022 If Site Rite image not attached, placement could not be confirmed due to current cardiac rhythm.    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 Receiving IV diuretics to see if any improvement.  No fever nor leukocytosis which is reassuring  Anemia = likely post op. Continue to monitor.   [facility] for Infectious Diseases Pager: [phone]  10/14/2022, 4:36 PM

## 2022-10-14 NOTE — Progress Notes (Signed)
Inpatient Rehabilitation Care Coordinator Discharge Note   Patient Details  Name: Anna Weiss MRN: [id number] Date of Birth: 10-10-1925   Discharge location: Home with daughter  Length of Stay: 16 Days  Discharge activity level: supervision overall  Home/community participation: daughter and son  Patient response ZY:[id number] Literacy - How often do you need to have someone help you when you read instructions, pamphlets, or other written material from your doctor or pharmacy?: Never  Patient response SW:FUXNAT Isolation - How often do you feel lonely or isolated from those around you?: Rarely  Services provided included: MD, RD, PT, OT, SLP, CM, RN, TR, Pharmacy, Neuropsych, SW  Financial Services:  Charity fundraiser Utilized: Medicare    Choices offered to/list presented to: daughter  Follow-up services arranged:  [place]: [facility]         Patient response to transportation need: Is the patient able to respond to transportation needs?: Yes In the past 12 months, has lack of transportation kept you from medical appointments or from getting medications?: No In the past 12 months, has lack of transportation kept you from meetings, work, or from getting things needed for daily living?: No    Comments (or additional information):  Patient/Family verbalized understanding of follow-up arrangements:  Yes  Individual responsible for coordination of the follow-up plan: daughter, [name] [phone]  Confirmed correct DME delivered: Dyanne Iha 10/14/2022    Dyanne Iha

## 2022-10-14 NOTE — Progress Notes (Signed)
Orthopedic Surgery Post-operative Note   Assessment: Patient is a 88 y.o. female with left intertrochanteric femur fracture status post IMN (09/25/2022), developed persistent drainage and now s/p I&D (10/09/2022)   Plan: -Operative plans: complete -Infectious disease consulted, appreciate recs -Keep dressing dry and in place at all times -Will follow intra-operative cultures (staph species) -Diet: regular -DVT ppx: xarelto -Antibiotics: daptomycin -Weight bearing status: as tolerated -PT/OT evaluate and treat -Pain control -Dispo: per primary  ___________________________________________________________________________  Subjective: No acute events overnight. Dressing replaced by nursing last night. Patient is unsure if they were saturated or if they were soiled. Having a lot of nausea this morning. No emesis. Hip pain is well controlled. Denies paresthesias and numbness.    Physical Exam:  General: no acute distress, laying in bed, appears stated age Respiratory: unlabored breathing, symmetric chest rise Neurologic: alert, orientated, following commands   MSK:   -Left lower extremity  Dressings c/d/i EHL/TA/GSC intact Sensation intact to light touch in sural/saphenous/deep peroneal/superficial peroneal/tibial nerve distributions Foot warm and well perfused   Patient name: Anna Weiss Patient MRN: [id number] Date: 10/14/22

## 2022-10-14 NOTE — Progress Notes (Signed)
Patient ID: Anna Weiss, female   DOB: 12-Sep-1925, 88 y.o.   MRN: [id number]  SW updated [facility] with change in pt d/c date to 11/29, and new IV abx for 4 weeks. SW asked [facility] to discuss IV abx need. Will review referral and follow-up.   SW met with pt and pt dtr in room to provide updates from team conference, inform on new IV abx for 4 weeks, and d/c date now 11/29. Pt dtr expressed concerns about pt current level of care, and stated pt will need to ideally be what she was before this new downward turn, otherwise, prefers short term rehab in [place]. SW explained SNF placement process, and shared it is at the discretion of the SNF to accept, and encouraged her to consider other SNFs in the event this does not go as planned. SW shared will send out SNF referral, and will continue to review pt progress. Fam edu scheduled for Monday (11/27) 1pm-4pm with pt dtr. Confirms she has all DME: RW, 3in1 BSC, and w/c.    Loralee Pacas, MSW, [place] Office: [phone] Cell: [phone] Fax: [phone]

## 2022-10-14 NOTE — Progress Notes (Signed)
Occupational Therapy Weekly Progress Note  Patient Details  Name: Anna Weiss MRN: [id number] Date of Birth: July 13, 1925  Beginning of progress report period: October 09, 2022 End of progress report period: October 14, 2022  Today's Date: 10/14/2022 OT Individual Time: Missed 45 min- Missed Minutes due to pt remaining on medical hold for CXR and continued medical w/u.       Patient has met 0 of 4 short term goals. unable to report current status d/t medical hold since early week. Was max A for LB self care and mod A for bed to 3 in 1 last session and now has multiple medical complications. Team conf held and d/c date extended to 11/29 and new Week 3 STG's added. Will downgrade LTG's next weekly note if needed.   Patient continues to demonstrate the following deficits: muscle weakness, decreased cardiorespiratoy endurance and decreased oxygen support, and decreased sitting balance, decreased standing balance, decreased postural control, and decreased balance strategies and therefore will continue to benefit from skilled OT intervention to enhance overall performance with BADL, iADL, and Reduce care partner burden.  Patient was progressing toward long term goals however on medical hold this am since yesterday and will downgrade if needed. New Week 3 STG's added. Continue plan of care.  OT Short Term Goals   10/14/22 1205  OT Short Term Goals  Short Term Goals Week Week 3  OT Short Term Goals - Week 2  OT Short Term Goal 1 - Progress (Week 2) Revised due to lack of progress  OT Short Term Goals - Week 3  OT Short Term Goal 1 (Week 3) Pt will transfer bed to DABSC with mod A  OT Short Term Goal 2 (Week 3) Pt will bathe sink side with min A using AE  OT Short Term Goal 3 (Week 3) Pt will stand for 1 minute for simple ADL's with support with min A     Therapy Documentation Precautions:  Precautions Precautions: Fall, Other (comment) Precaution Comments: monitor O2, orthostatics,  MRSA, O2, PICC, Foley  Restrictions Weight Bearing Restrictions: Yes LLE Weight Bearing: Weight bearing as tolerated   Therapy/Group: Individual Therapy  [name] 10/14/2022, 7:39 AM

## 2022-10-14 NOTE — Progress Notes (Signed)
Order obtained to insert foley. 71F foley inserted as ordered while maintaining sterile field. Yellow clear urine returned. No complications noted. Pt tolerated well. Sheela Stack, LPN

## 2022-10-14 NOTE — Progress Notes (Signed)
Physical Therapy Session Note  Patient Details  Name: NYEMA HACHEY MRN: [id number] Date of Birth: [birth year]  Today's Date: 10/14/2022 PT Missed Time: 9 Minutes Missed Time Reason: MD hold (Comment)  Short Term Goals: Week 2:  PT Short Term Goal 1 (Week 2): STG=LTG due to ELOS  Skilled Therapeutic Interventions/Progress Updates:      Therapy Documentation Precautions:  Precautions Precautions: Fall, Other (comment) Precaution Comments: monitor O2, orthostatics Restrictions Weight Bearing Restrictions: Yes LLE Weight Bearing: Weight bearing as tolerated  Pt missed 60 minutes of skilled PT due to medical hold by MD.    Therapy/Group: Individual Therapy  [name] [name] PT, DPT  10/14/2022, 7:39 AM

## 2022-10-14 NOTE — Patient Care Conference (Signed)
Inpatient RehabilitationTeam Conference and Plan of Care Update Date: 10/14/2022   Time: 11:27 AM   Patient Name: Anna Weiss      Medical Record Number: [id number]  Date of Birth: 1925/09/23 Sex: Female         Room/Bed: 4M07C/4M07C-01 Payor Info: Payor: MEDICARE / Plan: MEDICARE PART A AND B / Product Type: *No Product type* /    Admit Date/Time:  09/28/2022  4:56 PM  Primary Diagnosis:  Fracture, intertrochanteric, left femur, sequela  Hospital Problems: Principal Problem:   Fracture, intertrochanteric, left femur, sequela Active Problems:   Drainage from wound    Expected Discharge Date: Expected Discharge Date: 10/22/22  Team Members Present: Physician leading conference: Dr. Courtney Heys Social Worker Present: Loralee Pacas, [place] Nurse Present: Tacy Learn, RN PT Present: [name], PT OT Present: Jamey Ripa, OT PPS Coordinator present : Gunnar Fusi, SLP     Current Status/Progress Goal Weekly Team Focus  Bowel/Bladder   Patient is continent of B/B, LBM 10/13/22   Maintain continence   QS/PRN assessment    Swallow/Nutrition/ Hydration               ADL's   unable to report current status d/t medical hold since early week. Was max A for LB self care and mod A for bed to 3 in 1 last session   may need to revise LTG's to reflect status   continue to progress BADL's when off medical hold    Mobility   unknown current mobility, on 11/15 supervision bed mobility, SBA sit to stand and stand pivot, mod A couch transfer, gait x 135 ft CGA   supervision  bed mobility, strength, transfers, gait, pt/fam education, activity tolerance    Communication                Safety/Cognition/ Behavioral Observations               Pain   S/P Left Femur Fx dressings x 2  CDI, with no drainage, pain score range 8-10/10 with activity, medicated with Tramadol 50 mg q6 hrs PRN with decrease verbakized, Robaxin abd Tylenol PRN   Pain < 3,   Continue to  assess and medicate within written orders, assess qs/prn    Skin   LLE Surgical hip healing well, intact no drainage, tenderness,multiple ecchymotic areas to left hip, perineal areas, arms ,   Prevetion of infection  Continue assessment QS/PRN      Discharge Planning:  Daughter has been here and observed will ned to do hands on education so feels comfortable with Mom's care needs for home.   Team Discussion: Left femur fracture. I/O cath, incontinent of bowel. Foley and will do voiding trial. If unable to void or has retention, foley will be placed back in with urology follow up. Tramadol for pain. IND to left thigh 11/16. Wound culture positive for MRSA. 4 weeks ABT. PICC line placed. PNU. ID consulted for ABT tx. KUB negative for ileus. Clear liquid diet. Blood (2 units) administered 11/20.  Changes in care at this time and with therapy. Adjustment to discharge date to 11/29. Patient on target to meet rehab goals: Therapy to evaluate patient due to medical changes.  *See Care Plan and progress notes for long and short-term goals.   Revisions to Treatment Plan:  Foley placement with voiding trial, medication adjustments, monitor labs, IV ABT x 4 weeks. New PNU, Consult with ID  Teaching Needs: Medications, safety, gait/transfer training, IV ABT training, skin/wound  care, bladder and bowel management  Current Barriers to Discharge: Decreased caregiver support, IV antibiotics, Incontinence, Wound care, and Medication compliance  Possible Resolutions to Barriers: Family education, nursing education, IV ABT education, order recommended DME     Medical Summary Current Status: needs foley due to high volumes and urinary retention- x 1 week- L hip wound; flomax on board; and new pnmeumonia- starting ABX and on Dapto for MRSA infection of L hip  Barriers to Discharge: Complicated Wound;Electrolyte abnormality;Neurogenic Bowel & Bladder;Inadequate Nutritional Intake;Oxygen Requirement;Other  (comments);Self-care education;Uncontrolled Hypertension;Uncontrolled Pain;Infection/IV Antibiotics  Barriers to Discharge Comments: PICC line- on IV Dapto for 4 weeks Possible Resolutions to [employer] Focus: last session 11/15- 100 ft- but had surgery 11/16 so much worse- new O2 for now due to peumonia;- 11/29   Continued Need for Acute Rehabilitation Level of Care: The patient requires daily medical management by a physician with specialized training in physical medicine and rehabilitation for the following reasons: Direction of a multidisciplinary physical rehabilitation program to maximize functional independence : Yes Medical management of patient stability for increased activity during participation in an intensive rehabilitation regime.: Yes Analysis of laboratory values and/or radiology reports with any subsequent need for medication adjustment and/or medical intervention. : Yes   I attest that I was present, lead the team conference, and concur with the assessment and plan of the team.   Ernest Pine 10/14/2022, 5:47 PM

## 2022-10-14 NOTE — Progress Notes (Signed)
Physical Therapy Session Note  Patient Details  Name: Anna Weiss MRN: [id number] Date of Birth: 28-Sep-1925  Today's Date: 10/14/2022 PT Missed Time: 34 Minutes Missed Time Reason: MD hold (Comment)  Short Term Goals: Week 2:  PT Short Term Goal 1 (Week 2): STG=LTG due to ELOS  Skilled Therapeutic Interventions/Progress Updates:      Therapy Documentation Precautions:  Precautions Precautions: Fall, Other (comment) Precaution Comments: monitor O2, orthostatics Restrictions Weight Bearing Restrictions: Yes LLE Weight Bearing: Weight bearing as tolerated  Pt missed 45 minutes of skilled PT due to SOB and feeling ill. PT notified provider and pt now 15/7.     Therapy/Group: Individual Therapy  [name] [name] PT, DPT  10/14/2022, 3:11 PM

## 2022-10-14 NOTE — Progress Notes (Incomplete)
Patient resting in semi fowlers position, O2 sat 97% on 2 L/nasal cannula, denies discomfort or distress, Continue blood transfusion,without  complications, made as comfortable as possible, monitor ,  2030  assisted up tp BS Chair with the assistance of 2 staff and tolerated OOB fairly.Provided po liquids po pain administered and psiste

## 2022-10-14 NOTE — Progress Notes (Signed)
Occupational Therapy Note  Patient Details  Name: Anna Weiss MRN: [id number] Date of Birth: 11-Nov-1925  Today's Date: 10/14/2022 OT Missed Time: 66 Minutes Missed Time Reason: Other (comment);Patient fatigue;Patient ill (comment)  Pt resting in bed upon arrival with daughter present. Pt reports she is still feeling "ill" and "terrible." Pt unable to participate in therapy. Will attempt to see pt as schedule allows.   Leotis Shames [facility] 10/14/2022, 1:33 PM

## 2022-10-14 NOTE — Progress Notes (Signed)
Pt c/o SOB. 3+ pitting noted to BLE and coarse crackles on BLL. Pt placed in high fowlers. Pt o2 saturations 98% on 2L nasal cannula. IV fluids paused at this time.  MD Lovorn notified, new orders received. Sheela Stack, LPN

## 2022-10-14 NOTE — Progress Notes (Incomplete Revision)
Inpatient Rehabilitation Care Coordinator Discharge Note   Patient Details  Name: Anna Weiss MRN: [id number] Date of Birth: May 27, 1925   Discharge location: Home with daughter  Length of Stay: 16 Days  Discharge activity level: supervision overall  Home/community participation: daughter and son  Patient response YI:RSWNIO Literacy - How often do you need to have someone help you when you read instructions, pamphlets, or other written material from your doctor or pharmacy?: Never  Patient response EV:OJJKKX Isolation - How often do you feel lonely or isolated from those around you?: Rarely  Services provided included: MD, RD, PT, OT, SLP, CM, RN, TR, Pharmacy, Neuropsych, SW  Financial Services:  Charity fundraiser Utilized: Medicare    Choices offered to/list presented to: daughter  Follow-up services arranged:  [place]: [facility]         Patient response to transportation need: Is the patient able to respond to transportation needs?: Yes In the past 12 months, has lack of transportation kept you from medical appointments or from getting medications?: No In the past 12 months, has lack of transportation kept you from meetings, work, or from getting things needed for daily living?: No    Comments (or additional information):  Patient/Family verbalized understanding of follow-up arrangements:  Yes  Individual responsible for coordination of the follow-up plan: daughter, [name] [phone]  Confirmed correct DME delivered: Dyanne Iha 10/14/2022    Dyanne Iha

## 2022-10-14 NOTE — Progress Notes (Addendum)
PROGRESS NOTE   Subjective/Complaints:  Pt reports feeling bad overall, but has a little more energy after blood.  Initially focused on Nausea- feels like will vomit- but better after IV Zofran given.  After seen 2nd time, c/o SOB- ordered CXR and IV Lasix due to LE edema-  Also ordered phenergan PO and PR and IV Compazine prn- and put on clear liquid diet.   ROS:   Pt denies:  (+)SOB, abd pain, CP,  (+)N/V/C/D, and vision changes  Except per HPI   Objective:   DG CHEST PORT 1 VIEW  Result Date: 10/13/2022 CLINICAL DATA:  PICC line placement EXAM: PORTABLE CHEST 1 VIEW COMPARISON:  Portable exam 1653 hours compared to 10/01/2022 FINDINGS: RIGHT arm PICC line tip projects over SVC. Enlargement of cardiac silhouette. Atherosclerotic calcification aorta. Bibasilar effusions and atelectasis, greater on LEFT. Cannot exclude consolidation in LEFT lower lobe. Upper lungs clear. No pneumothorax. Osseous demineralization. Atherosclerotic calcifications at carotid bifurcations. IMPRESSION: Bibasilar effusions and atelectasis greater on LEFT; cannot exclude LEFT lower lobe pneumonia. Tip of RIGHT arm PICC line projects over SVC. Electronically Signed   By: Lavonia Dana M.D.   On: 10/13/2022 17:07   [place] EKG SITE RITE  Result Date: 10/12/2022 If Site Rite image not attached, placement could not be confirmed due to current cardiac rhythm.  Recent Labs    10/13/22 0714  WBC 6.8  HGB 6.4*  HCT 19.0*  PLT 276    Recent Labs    10/13/22 0714  NA 131*  K 3.8  CL 98  CO2 26  GLUCOSE 116*  BUN 9  CREATININE 0.72  CALCIUM 8.3*    Intake/Output Summary (Last 24 hours) at 10/14/2022 1031 Last data filed at 10/13/2022 2035 Gross per 24 hour  Intake 1162 ml  Output 601 ml  Net 561 ml        Physical Exam: Vital Signs Blood pressure (!) 155/51, pulse 61, temperature 98.2 F (36.8 C), temperature source Oral, resp. rate 17,  height '5\' 7"'$  (1.702 m), weight 68.7 kg, SpO2 97 %.        General: awake, alert, appropriate, appears ill; c/o nausea  HENT: conjugate gaze; oropharynx dry- more color in cheeks, conjunctivae CV: regular rate- stable rate; no JVD Pulmonary:a few rhonchi; but mainly decreased at bases B/L  GI: soft, mildly TTP but no rebound; questionable slightly distended; (+)hypoactive BS Psychiatric: appropriate but sleepy and flat Neurological: alert, but sleepy and appears very uncomfortable Skin- has surgical dressing in place with a little dried blood on dressing- can see without removing dressing Musculoskeletal:     Cervical back: Normal range of motion.     Comments: Left hip tender, still swollen - bruised- mainly yellow-  much better- foam dressing over L hip C/D/I Skin: L hip less swelling; dressing C/D/I    General: Skin is warm.     Findings: Bruising (arms) present.     Comments: Left hip incision CDI with dry dressing- no drainage through dressing  Left ear as above. L ear we've removed bolster- still really bruised  Neurological:     Mental Status: She is alert and oriented to person, place, and time.  Cranial Nerves: No cranial nerve deficit.     Sensory: No sensory deficit.     Comments: Motor: 4-5/5 UE's. RLE 3/5 prox to 5/5 distal. LLE 1-2/5 (pain) to 4/5 distally. DTR's 1+, no abnl tone or ataxia   Assessment/Plan: 1. Functional deficits which require 3+ hours per day of interdisciplinary therapy in a comprehensive inpatient rehab setting. Physiatrist is providing close team supervision and 24 hour management of active medical problems listed below. Physiatrist and rehab team continue to assess barriers to discharge/monitor patient progress toward functional and medical goals  Care Tool:  Bathing    Body parts bathed by patient: Right arm, Left arm, Chest, Abdomen, Front perineal area, Right upper leg, Face, Left upper leg   Body parts bathed by helper: Left lower  leg, Right lower leg, Buttocks     Bathing assist Assist Level: Moderate Assistance - Patient 50 - 74%     Upper Body Dressing/Undressing Upper body dressing   What is the patient wearing?: Pull over shirt    Upper body assist Assist Level: Set up assist    Lower Body Dressing/Undressing Lower body dressing      What is the patient wearing?: Incontinence brief, Pants     Lower body assist Assist for lower body dressing: Minimal Assistance - Patient > 75% (use of reacher and RW)     Toileting Toileting    Toileting assist Assist for toileting: Moderate Assistance - Patient 50 - 74%     Transfers Chair/bed transfer  Transfers assist  Chair/bed transfer activity did not occur: Safety/medical concerns (dizziness, pain, decreased balance)  Chair/bed transfer assist level: Minimal Assistance - Patient > 75%     Locomotion Ambulation   Ambulation assist   Ambulation activity did not occur: Safety/medical concerns (dizziness, pain, decreased balance)  Assist level: Minimal Assistance - Patient > 75% Assistive device: Walker-rolling Max distance: 38   Walk 10 feet activity   Assist  Walk 10 feet activity did not occur: Safety/medical concerns (dizziness, pain, decreased balance)  Assist level: Minimal Assistance - Patient > 75% Assistive device: Walker-rolling   Walk 50 feet activity   Assist Walk 50 feet with 2 turns activity did not occur: Safety/medical concerns         Walk 150 feet activity   Assist Walk 150 feet activity did not occur: Safety/medical concerns         Walk 10 feet on uneven surface  activity   Assist Walk 10 feet on uneven surfaces activity did not occur: Safety/medical concerns (dizziness, pain, decreased balance)         Wheelchair     Assist Is the patient using a wheelchair?: Yes Type of Wheelchair: Manual Wheelchair activity did not occur: Safety/medical concerns (dizziness, pain, decreased balance)          Wheelchair 50 feet with 2 turns activity    Assist    Wheelchair 50 feet with 2 turns activity did not occur: Safety/medical concerns (dizziness, pain, decreased balance)       Wheelchair 150 feet activity     Assist  Wheelchair 150 feet activity did not occur: Safety/medical concerns (dizziness, pain, decreased balance)       Blood pressure (!) 155/51, pulse 61, temperature 98.2 F (36.8 C), temperature source Oral, resp. rate 17, height '5\' 7"'$  (1.702 m), weight 68.7 kg, SpO2 97 %.  Medical Problem List and Plan: 1. Functional deficits secondary to left intertrochanteric femur fx after fall. Pt s/p IMN 09/25/22             -  LLE WBAT             -patient may shower             -ELOS/Goals: 15-20 days  Team conference today to determine how much longer to keep pt  Con't CIR as tolerated- on hold yesterday and this AM so far, might need longer.  2.  Antithrombotics: -DVT/anticoagulation:  xarelto '10mg'$  daily for 14 days 11/21- restarted Eliquis 2.5 mg BID for DVT prophylaxis-              -antiplatelet therapy:  resume plavix '75mg'$  daily 3. Pain Management: tylenol and hydrocodone prn for pain             -robaxin prn for muscle spasms  11/8- pain limiting therapy- doesn't want opiates- mainly using tylenol-   11/9- d/w pt- she's willing to try tramadol 50 mg q6 hours prn- she's scared of Coding if takes Norco- I explained that Tramadol is mild and shouldn't cause any cognitive issues.   11/10- pain much better- not resolved, but better with tramadol- con't tramadol and tylenol prn  11/14- pain still doing better with tramadol  11/17- is worse today since had surgery yesterday-doesn't want any other meds  11/18 reports pain controlled, continue tramadol PRN 4. Mood/Behavior/Sleep: team to provide ego support             -antipsychotic agents: n/a 5. Neuropsych/cognition: This patient is capable of making decisions on her own behalf. 6. Skin/Wound Care: dry dressings to  left ear and hip  11/9- having some drainage- from L hip- is improved this Am- will monitor closely.  7. Fluids/Electrolytes/Nutrition: encourage appropriate PO             -pt's intake has inconsistent so far 8. Nausea:              -might be positional or med related.              -also having associated gas             -pt told me she's been moving bowels, however I don't see a bm recorded             -will try sorbitol today, dulcolax suppository prn             -protonix 11/8- has ileus- per #15 below  11/9- still having nausea- will change diet to clear liquid per pt request- and monitor  11/10- on clear liquid diet- pt wants to wait 1 more day before changing to regular diet- if not better tomorrow, will need to call GI  11/14- nausea resolved- back on regular diet  11/16- nauseated again  111/7- Put on IVFs for another 24 hours at 75cc/hour since had surgery at 10-11pm at night and didn't get back from PACU until 1am- sh'e sleepy and not taking in much  11/18 LBM today, improved continue to monitor 11/20- LBM last night  11.21- checking KUB and ordered clear liquid diet- also put on IVFs, but holding until CXR back- since not drinking a lot 9. Left lower lobe consolidation             -pneumonia ruled out. Likely atelectasis             -IS, OOB             -wean oxygen to off  11/7- will write order to wean O2.  10. Sinus bradycardia:             -  on propranolol and verapamil at home             -continue to hold as HR in 50's  11/14- since still in 50s, will not restart  11. Essential HTN             -continue hctz  11/14- BP usually controlled but up today- will monitor for trend  11/15- somewhat elevated, but not sure if dropping- see #21           11/16- started hydralazine  11/17- just got hydralazine- is slightly better- might need it increased over weekend  -11/19 good control overall continue to monitor  11/20- somewhat variable- due to ABLA likely Vitals:    10/14/22 0242 10/14/22 0600  BP:  (!) 155/51  Pulse: 74 61  Resp:  17  Temp:  98.2 F (36.8 C)  SpO2: 95% 97%    12. Right middle lobe nodule 2.3 cm- should be considered primary lung CA until ruled out- also has nodule as well 20m on R middle lung- could be mets per CT scan.              -f/u as outpt  11/7- d/w daughter- she said no f/u at this time- due to pt's age.   11/8- daughter doesn't want to let pt know about tumors.   13. Mild leukocytosis- 11.2   11/6- afebrile- other than difficulty swallowing- says it's dry?; denies UTI Sx's- if gets worse clinically, will recheck in AM- right now scheduled for Wed AM.   11/8- WBC down to 9.4-  14. Constipation  11/6- LBM on day of fall per pt and chart- will give Sorbitol after therapy and follow with SSE if needed.   11/7- had multiple BM's- didn't need SSE- per notes- feels cleaned out   11/19 LBM today, improved 15. ABLA  11/6- Down to 7.6- will recheck Wednesday to make sure not dropping further- and transfuse if required.   11/8- Hb 7.3- on edge of needing transfusion.   11/9- Hb up to 8.0-   11/10- Hb 7.7- will con't to monitor closely.   11/15- Hb 8.4  11/20- Hb 6.4- will transfuse as below 16. Post op ileus resolved BM x 3 11/10, 1 BM 11/12- advance diet as tolerated   -Recheck level tomorrow  11/21- New n/V-- ordered another KUB to look for ileus and put back on clear liquid diet- also ordered IV compazine, PO/PR phenergan and d/c'd Zofran since can cause constipation 17. Thrush?  11/9- will add Magic mouthwash- don't see plaques, but tongue dry and furrowed. Says things taste 'wierd".   11/10- said magic mouthwash helping Sx's.  18. Leukocytosis improved -   11/15- WBC down to 7.8  11/16- will recheck today to make sure not elevated  11/17- WBC stable at 8k  Recheck tomorrow  11/20- WBC down to 6.8    Latest Ref Rng & Units 10/13/2022    7:14 AM 10/09/2022    6:38 AM 10/06/2022    5:20 AM  CBC  WBC 4.0 - 10.5 K/uL  6.8  8.6  7.8   Hemoglobin 12.0 - 15.0 g/dL 6.4  C 8.2  8.4   Hematocrit 36.0 - 46.0 % 19.0  24.6  24.1   Platelets 150 - 400 K/uL 276  336  346     C Corrected result    19. Hypokalemia and low Mg  11/10- repleted up to 3.5- will put on Kcl 10 mEq daily for now and recheck  Monday- since K+ still  3.5- will also check Mg Monday as well   11/14= Mg 1.6 and K+ 3.1- repleted again  yesterday by pharmacy for [place]- will recheck labs iN AM  11/15- K+ 4.2- will give IV Mg 2G for Mg 1.6- and recheck in AM  11/16- Mg 1.7- still low- will put on PO Mg 400 mg BID  Recheck K+ and MG tomorrow  11/20- K+ 3.8 and Mg 1.8- will con't regimen 20. Swallowing issues  11/14- appears to be thrush- will start Diflucan- due to renal issues, will need to be 50 mg daily- x 7 days doses-   11/15- reports things taste better and was able ot swallow some this AM of breakfast-con't diflucan  11/19 reports improved, continue to monitor 21. Dizziness/lightheadedness/presyncope  11/15- will have pt get orthostatics checked by nursing and therapy- BP on high side, so not sure if Sx's due to BP being high or dropping.   11/16- added hydralazine 25 mg TID- is somewhat better today  11/20- BP a little on low side- likely due to ABLA- will hold BP meds if need be.  22. Infection of L hip?  11/16- still having drainage from L hip- spoke to Ortho- they are taking her to OR today at 5pm for wash out-   11/17- Gram stains negative so far- waiting for Cx's- surgeon gave Ancef x2 IV and will monitor Cultures  11/19 wound culture growing staph,Continue vancomycin, surgery advising ID consult, called ID to discuss -consult placed  11/20- con't IV Vanc per ID- Cx's still pending final. 11/21- changed to Daptomycin, per ID- will double check labs today- CK is pending- didn't get done yesterday- will check labs today as well  23. Urinary retention  11/16- is new- will check U/A and Cx- and labs- CBC- with diff and CMP just to make sure  no other issues  11/17- U/A (-) and doesn't have elevated WBC- mild left shift however- will start Flomax 0.4 mg q supper  11/8 continent PVRs a little improved today< 369m, continue to monitor, continue flomax, IC if > 350 ml 24. ABLA anemia-  11/20- down to 6.4- will transfuse 2 units since so low.    11/21- labs today- pending 25. SOB  11/21- pt c/o SOB- O2 sats 98% on 2L- has been on O2 since surgery last week- will check CXR and gave IV Lasix 40 mg x1 - and let pt know we need her to void. If CXR Negative, will need CTA of chest to look for PE.   I spent a total of 52   minutes on total care today- >50% coordination of care- due to d/w daughter, saw pt 2x; and nursing x2   LOS: 16 days A FACE TO FACE EVALUATION WAS PERFORMED  Christel Bai 10/14/2022, 10:31 AM

## 2022-10-14 NOTE — Progress Notes (Signed)
Pt o2 decreased to 0.5L nasal cannula.  Pt tolerating well.

## 2022-10-15 LAB — AEROBIC/ANAEROBIC CULTURE W GRAM STAIN (SURGICAL/DEEP WOUND)
Gram Stain: NONE SEEN
Gram Stain: NONE SEEN
Gram Stain: NONE SEEN
Gram Stain: NONE SEEN
Gram Stain: NONE SEEN

## 2022-10-15 LAB — CBC WITH DIFFERENTIAL/PLATELET
Abs Immature Granulocytes: 0.02 10*3/uL (ref 0.00–0.07)
Basophils Absolute: 0 10*3/uL (ref 0.0–0.1)
Basophils Relative: 0 %
Eosinophils Absolute: 0.2 10*3/uL (ref 0.0–0.5)
Eosinophils Relative: 2 %
HCT: 28.1 % — ABNORMAL LOW (ref 36.0–46.0)
Hemoglobin: 9.7 g/dL — ABNORMAL LOW (ref 12.0–15.0)
Immature Granulocytes: 0 %
Lymphocytes Relative: 13 %
Lymphs Abs: 0.9 10*3/uL (ref 0.7–4.0)
MCH: 35.1 pg — ABNORMAL HIGH (ref 26.0–34.0)
MCHC: 34.5 g/dL (ref 30.0–36.0)
MCV: 101.8 fL — ABNORMAL HIGH (ref 80.0–100.0)
Monocytes Absolute: 0.9 10*3/uL (ref 0.1–1.0)
Monocytes Relative: 12 %
Neutro Abs: 5 10*3/uL (ref 1.7–7.7)
Neutrophils Relative %: 73 %
Platelets: 285 10*3/uL (ref 150–400)
RBC: 2.76 MIL/uL — ABNORMAL LOW (ref 3.87–5.11)
RDW: 22 % — ABNORMAL HIGH (ref 11.5–15.5)
WBC: 7 10*3/uL (ref 4.0–10.5)
nRBC: 0 % (ref 0.0–0.2)

## 2022-10-15 LAB — BASIC METABOLIC PANEL
Anion gap: 13 (ref 5–15)
BUN: 9 mg/dL (ref 8–23)
CO2: 29 mmol/L (ref 22–32)
Calcium: 8.6 mg/dL — ABNORMAL LOW (ref 8.9–10.3)
Chloride: 87 mmol/L — ABNORMAL LOW (ref 98–111)
Creatinine, Ser: 0.63 mg/dL (ref 0.44–1.00)
GFR, Estimated: >60 mL/min (ref >60)
Glucose, Bld: 100 mg/dL — ABNORMAL HIGH (ref 70–99)
Potassium: 3.4 mmol/L — ABNORMAL LOW (ref 3.5–5.1)
Sodium: 129 mmol/L — ABNORMAL LOW (ref 135–145)

## 2022-10-15 MED ORDER — ORAL CARE MOUTH RINSE
15.0000 mL | OROMUCOSAL | Status: DC | PRN
  Administered 2022-10-19: 15 mL via OROMUCOSAL

## 2022-10-15 NOTE — Progress Notes (Signed)
PROGRESS NOTE   Subjective/Complaints:  Pt reports SOB much better She also says nausea better- is drinking better, however wants ot wait to go back to regular diet until tomorrow LBM yesterday ROS:  Pt denies SOB, abd pain, CP, N/V/C/D, and vision changes  Except per HPI   Objective:   DG Chest 2 View  Result Date: 10/14/2022 CLINICAL DATA:  Shortness of breath.  Nausea and vomiting today. EXAM: CHEST - 2 VIEW COMPARISON:  Radiographs 10/13/2022 and 10/01/2022.  CT 09/24/2022. FINDINGS: 0957 hours. The heart size and mediastinal contours are stable with aortic atherosclerosis. A right arm PICC projects to the lower SVC level. There are persistent left greater than right pleural effusions with associated bibasilar airspace opacities. Component in the left perihilar region appears mildly progressive. Grossly stable underlying right middle lobe pulmonary nodule as correlated with prior CT. No acute osseous findings. IMPRESSION: 1. Persistent bibasilar airspace opacities and pleural effusions suspicious for multilobar pneumonia. Left perihilar component appears slightly worse. 2. Underlying right middle lobe nodule as seen on prior chest CT, potentially malignant. Electronically Signed   By: Richardean Sale M.D.   On: 10/14/2022 10:44   DG Abd 1 View  Result Date: 10/14/2022 CLINICAL DATA:  Nausea and vomiting EXAM: ABDOMEN - 1 VIEW COMPARISON:  Radiograph 10/02/2022 FINDINGS: There is mild gas distention of bowel in a nonobstructive pattern. Right upper quadrant calcifications compatible with cholelithiasis. Pelvic phleboliths. IMPRESSION: Mild gaseous distension of bowel in a nonobstructive pattern. Electronically Signed   By: Maurine Simmering M.D.   On: 10/14/2022 10:40   Recent Labs    10/14/22 1425 10/15/22 0416  WBC 8.3 7.0  HGB 9.6* 9.7*  HCT 27.1* 28.1*  PLT 270 285    Recent Labs    10/14/22 1425 10/15/22 0416  NA 130*  129*  K 3.0* 3.4*  CL 88* 87*  CO2 30 29  GLUCOSE 113* 100*  BUN 8 9  CREATININE 0.59 0.63  CALCIUM 8.5* 8.6*    Intake/Output Summary (Last 24 hours) at 10/15/2022 1829 Last data filed at 10/15/2022 1600 Gross per 24 hour  Intake 1360 ml  Output 800 ml  Net 560 ml        Physical Exam: Vital Signs Blood pressure (!) 137/58, pulse 60, temperature 98.4 F (36.9 C), temperature source Oral, resp. rate 16, height '5\' 7"'$  (1.702 m), weight 68.7 kg, SpO2 95 %.         General: awake, alert, appropriate, sitting up in bed; NAD HENT: conjugate gaze; oropharynx dry- gave fluids; better CV: regular rate; no JVD Pulmonary: CTA B/L; no W/R/R- good air movement except decreased at bases GI: soft, NT, ND, (+)BS- hypoactive Psychiatric: appropriate- brighter affect Neurological: Ox3-  Skin- has surgical dressing in place with a little dried blood on dressing- can see without removing dressing Musculoskeletal:     Cervical back: Normal range of motion.     Comments: Left hip tender, still swollen - bruised- mainly yellow-  much better- foam dressing over L hip C/D/I Skin: L hip less swelling; dressing C/D/I    General: Skin is warm.     Findings: Bruising (arms) present.  Comments: Left hip incision CDI with dry dressing- no drainage through dressing  Left ear as above. L ear we've removed bolster- still really bruised  Neurological:     Mental Status: She is alert and oriented to person, place, and time.     Cranial Nerves: No cranial nerve deficit.     Sensory: No sensory deficit.     Comments: Motor: 4-5/5 UE's. RLE 3/5 prox to 5/5 distal. LLE 1-2/5 (pain) to 4/5 distally. DTR's 1+, no abnl tone or ataxia   Assessment/Plan: 1. Functional deficits which require 3+ hours per day of interdisciplinary therapy in a comprehensive inpatient rehab setting. Physiatrist is providing close team supervision and 24 hour management of active medical problems listed below. Physiatrist  and rehab team continue to assess barriers to discharge/monitor patient progress toward functional and medical goals  Care Tool:  Bathing    Body parts bathed by patient: Right arm, Left arm, Chest, Abdomen, Front perineal area, Right upper leg, Face, Left upper leg   Body parts bathed by helper: Left lower leg, Right lower leg, Buttocks     Bathing assist Assist Level: Maximal Assistance - Patient 24 - 49%     Upper Body Dressing/Undressing Upper body dressing   What is the patient wearing?: Pull over shirt    Upper body assist Assist Level: Minimal Assistance - Patient > 75%    Lower Body Dressing/Undressing Lower body dressing      What is the patient wearing?: Incontinence brief, Pants     Lower body assist Assist for lower body dressing: Maximal Assistance - Patient 25 - 49%     Toileting Toileting    Toileting assist Assist for toileting: Maximal Assistance - Patient 25 - 49%     Transfers Chair/bed transfer  Transfers assist  Chair/bed transfer activity did not occur: Safety/medical concerns (dizziness, pain, decreased balance)  Chair/bed transfer assist level: Minimal Assistance - Patient > 75%     Locomotion Ambulation   Ambulation assist   Ambulation activity did not occur: Safety/medical concerns (dizziness, pain, decreased balance)  Assist level: Minimal Assistance - Patient > 75% Assistive device: Walker-rolling Max distance: 38   Walk 10 feet activity   Assist  Walk 10 feet activity did not occur: Safety/medical concerns (dizziness, pain, decreased balance)  Assist level: Minimal Assistance - Patient > 75% Assistive device: Walker-rolling   Walk 50 feet activity   Assist Walk 50 feet with 2 turns activity did not occur: Safety/medical concerns         Walk 150 feet activity   Assist Walk 150 feet activity did not occur: Safety/medical concerns         Walk 10 feet on uneven surface  activity   Assist Walk 10 feet on  uneven surfaces activity did not occur: Safety/medical concerns (dizziness, pain, decreased balance)         Wheelchair     Assist Is the patient using a wheelchair?: Yes Type of Wheelchair: Manual Wheelchair activity did not occur: Safety/medical concerns (dizziness, pain, decreased balance)         Wheelchair 50 feet with 2 turns activity    Assist    Wheelchair 50 feet with 2 turns activity did not occur: Safety/medical concerns (dizziness, pain, decreased balance)       Wheelchair 150 feet activity     Assist  Wheelchair 150 feet activity did not occur: Safety/medical concerns (dizziness, pain, decreased balance)       Blood pressure (!) 137/58, pulse 60,  temperature 98.4 F (36.9 C), temperature source Oral, resp. rate 16, height '5\' 7"'$  (1.702 m), weight 68.7 kg, SpO2 95 %.  Medical Problem List and Plan: 1. Functional deficits secondary to left intertrochanteric femur fx after fall. Pt s/p IMN 09/25/22             -LLE WBAT             -patient may shower             -ELOS/Goals: 15-20 days  Team conference today to determine how much longer to keep pt  Con't CIR- back to therapy today- set d/c date as 12/29 hopefully will regain her strength before then 2.  Antithrombotics: -DVT/anticoagulation:  xarelto '10mg'$  daily for 14 days 11/21- restarted Eliquis 2.5 mg BID for DVT prophylaxis-              -antiplatelet therapy:  resume plavix '75mg'$  daily 3. Pain Management: tylenol and hydrocodone prn for pain             -robaxin prn for muscle spasms  11/8- pain limiting therapy- doesn't want opiates- mainly using tylenol-   11/9- d/w pt- she's willing to try tramadol 50 mg q6 hours prn- she's scared of Coding if takes Norco- I explained that Tramadol is mild and shouldn't cause any cognitive issues.   11/10- pain much better- not resolved, but better with tramadol- con't tramadol and tylenol prn  11/14- pain still doing better with tramadol  11/17- is worse  today since had surgery yesterday-doesn't want any other meds  11/18 reports pain controlled, continue tramadol PRN 4. Mood/Behavior/Sleep: team to provide ego support             -antipsychotic agents: n/a 5. Neuropsych/cognition: This patient is capable of making decisions on her own behalf. 6. Skin/Wound Care: dry dressings to left ear and hip  11/9- having some drainage- from L hip- is improved this Am- will monitor closely.  7. Fluids/Electrolytes/Nutrition: encourage appropriate PO             -pt's intake has inconsistent so far 8. Nausea:              -might be positional or med related.              -also having associated gas             -pt told me she's been moving bowels, however I don't see a bm recorded             -will try sorbitol today, dulcolax suppository prn             -protonix 11/8- has ileus- per #15 below  11/9- still having nausea- will change diet to clear liquid per pt request- and monitor  11/10- on clear liquid diet- pt wants to wait 1 more day before changing to regular diet- if not better tomorrow, will need to call GI  11/14- nausea resolved- back on regular diet  11/16- nauseated again  111/7- Put on IVFs for another 24 hours at 75cc/hour since had surgery at 10-11pm at night and didn't get back from PACU until 1am- sh'e sleepy and not taking in much  11/18 LBM today, improved continue to monitor 11/20- LBM last night  11.21- checking KUB and ordered clear liquid diet- also put on IVFs, but holding until CXR back- since not drinking a lot  11/22- will stop IVFs- is drinking enough per labs- pt want sot wait to  go back to regular until tomorrow 9. Left lower lobe consolidation             -pneumonia ruled out. Likely atelectasis             -IS, OOB             -wean oxygen to off  11/7- will write order to wean O2.  10. Sinus bradycardia:             -on propranolol and verapamil at home             -continue to hold as HR in 50's  11/14- since  still in 50s, will not restart  11. Essential HTN             -continue hctz  11/14- BP usually controlled but up today- will monitor for trend  11/15- somewhat elevated, but not sure if dropping- see #21           11/16- started hydralazine  11/17- just got hydralazine- is slightly better- might need it increased over weekend  -11/19 good control overall continue to monitor  11/20- somewhat variable- due to ABLA likely  11/22- doing better Vitals:   10/15/22 1327 10/15/22 1421  BP: (!) 151/56 (!) 137/58  Pulse: (!) 59 60  Resp: 14 16  Temp: 97.7 F (36.5 C) 98.4 F (36.9 C)  SpO2: 97% 95%    12. Right middle lobe nodule 2.3 cm- should be considered primary lung CA until ruled out- also has nodule as well 34m on R middle lung- could be mets per CT scan.              -f/u as outpt  11/7- d/w daughter- she said no f/u at this time- due to pt's age.   11/8- daughter doesn't want to let pt know about tumors.   13. Mild leukocytosis- 11.2   11/6- afebrile- other than difficulty swallowing- says it's dry?; denies UTI Sx's- if gets worse clinically, will recheck in AM- right now scheduled for Wed AM.   11/8- WBC down to 9.4-  14. Constipation  11/6- LBM on day of fall per pt and chart- will give Sorbitol after therapy and follow with SSE if needed.   11/7- had multiple BM's- didn't need SSE- per notes- feels cleaned out   11/19 LBM today, improved 15. ABLA  11/6- Down to 7.6- will recheck Wednesday to make sure not dropping further- and transfuse if required.   11/8- Hb 7.3- on edge of needing transfusion.   11/9- Hb up to 8.0-   11/10- Hb 7.7- will con't to monitor closely.   11/15- Hb 8.4  11/20- Hb 6.4- will transfuse as below 16. Post op ileus resolved BM x 3 11/10, 1 BM 11/12- advance diet as tolerated   -Recheck level tomorrow  11/21- New n/V-- ordered another KUB to look for ileus and put back on clear liquid diet- also ordered IV compazine, PO/PR phenergan and d/c'd Zofran  since can cause constipation  11/22- wants ot wait til tomorrow to go back to regular diet- but can be changed anytime pt wants to back to regular diet 17. Thrush?  11/9- will add Magic mouthwash- don't see plaques, but tongue dry and furrowed. Says things taste 'wierd".   11/10- said magic mouthwash helping Sx's.  18. Leukocytosis improved -   11/15- WBC down to 7.8  11/16- will recheck today to make sure not elevated  11/17- WBC stable at [facility]  Recheck tomorrow  11/20- WBC down to 6.8    Latest Ref Rng & Units 10/15/2022    4:16 AM 10/14/2022    2:25 PM 10/13/2022    7:14 AM  CBC  WBC 4.0 - 10.5 K/uL 7.0  8.3  6.8   Hemoglobin 12.0 - 15.0 g/dL 9.7  9.6  6.4  C  Hematocrit 36.0 - 46.0 % 28.1  27.1  19.0   Platelets 150 - 400 K/uL 285  270  276     C Corrected result    19. Hypokalemia and low Mg  11/10- repleted up to 3.5- will put on Kcl 10 mEq daily for now and recheck Monday- since K+ still  3.5- will also check Mg Monday as well   11/14= Mg 1.6 and K+ 3.1- repleted again  yesterday by pharmacy for [place]- will recheck labs iN AM  11/15- K+ 4.2- will give IV Mg 2G for Mg 1.6- and recheck in AM  11/16- Mg 1.7- still low- will put on PO Mg 400 mg BID  Recheck K+ and MG tomorrow  11/20- K+ 3.8 and Mg 1.8- will con't regimen  11/22- K+ 3.4- repleted again IV- repleted yesterday IV since was 3.0-  20. Swallowing issues  11/14- appears to be thrush- will start Diflucan- due to renal issues, will need to be 50 mg daily- x 7 days doses-   11/15- reports things taste better and was able ot swallow some this AM of breakfast-con't diflucan  11/19 reports improved, continue to monitor 21. Dizziness/lightheadedness/presyncope  11/15- will have pt get orthostatics checked by nursing and therapy- BP on high side, so not sure if Sx's due to BP being high or dropping.   11/16- added hydralazine 25 mg TID- is somewhat better today  11/20- BP a little on low side- likely due to ABLA- will hold BP  meds if need be.  22. Infection of L hip?  11/16- still having drainage from L hip- spoke to Ortho- they are taking her to OR today at 5pm for wash out-   11/17- Gram stains negative so far- waiting for Cx's- surgeon gave Ancef x2 IV and will monitor Cultures  11/19 wound culture growing staph,Continue vancomycin, surgery advising ID consult, called ID to discuss -consult placed  11/20- con't IV Vanc per ID- Cx's still pending final. 11/21- changed to Daptomycin, per ID- will double check labs today- CK is pending- didn't get done yesterday- will check labs today as well  11/22- CK looking good- 23- con't Daptomycin- stop date is 11/07/22 per ID 23. Urinary retention  11/16- is new- will check U/A and Cx- and labs- CBC- with diff and CMP just to make sure no other issues  11/17- U/A (-) and doesn't have elevated WBC- mild left shift however- will start Flomax 0.4 mg q supper  11/8 continent PVRs a little improved today< 363m, continue to monitor, continue flomax, IC if > 350 ml 24. ABLA anemia-  11/20- down to 6.4- will transfuse 2 units since so low.    11/21- labs today- pending  11/22- Hb 9.7 25. SOB  11/21- pt c/o SOB- O2 sats 98% on 2L- has been on O2 since surgery last week- will check CXR and gave IV Lasix 40 mg x1 - and let pt know we need her to void. If CXR Negative, will need CTA of chest to look for PE.   11/22- down to 0.5 L of O2- and satting well- actually CO2 was too high on  2L- so reduced due to CO2 retention- SOB improved- of note, no leukocytosis  I spent a total of 35    minutes on total care today- >50% coordination of care- due to d/w nursing as well as reviewing all notes since yesterday-    LOS: 17 days A FACE TO FACE EVALUATION WAS PERFORMED  Ray Glacken 10/15/2022, 6:29 PM

## 2022-10-15 NOTE — Progress Notes (Signed)
[facility] for Infectious Disease    Date of Admission:  09/28/2022   Total days of antibiotics 7   ID: Anna Weiss is a 88 y.o. female with  MRSA deep tissue infection Principal Problem:   Fracture, intertrochanteric, left femur, sequela Active Problems:   Drainage from wound    Subjective: Patient reports occ cough and shortness of breath, improving. But she states she has this at home sometime. OT mentions this is the first time she is well enough to sit up in chair. Only on 0.5LNC at 95% Sat  Medications:   apixaban  2.5 mg Oral BID   Chlorhexidine Gluconate Cloth  6 each Topical Daily   clopidogrel  75 mg Oral Daily   feeding supplement  237 mL Oral BID BM   hydrALAZINE  25 mg Oral Q8H   hydrochlorothiazide  25 mg Oral Daily   levothyroxine  50 mcg Oral Q0600   magic mouthwash  10 mL Oral QID   magnesium oxide  400 mg Oral BID   methocarbamol  250 mg Oral TID   multivitamin with minerals  1 tablet Oral Daily   pantoprazole  40 mg Oral BID   polyvinyl alcohol  1 drop Both Eyes BID   potassium chloride  10 mEq Oral Daily   simvastatin  10 mg Oral QPM   tamsulosin  0.4 mg Oral QPC supper    Objective: Vital signs in last 24 hours: Temp:  [97.7 F (36.5 C)-98.4 F (36.9 C)] 98.4 F (36.9 C) (11/22 1421) Pulse Rate:  [59-66] 60 (11/22 1421) Resp:  [14-18] 16 (11/22 1421) BP: (129-154)/(46-58) 137/58 (11/22 1421) SpO2:  [90 %-97 %] 95 % (11/22 1421) Physical Exam  Constitutional:  oriented to person, place, and time. appears well-developed and well-nourished. No distress.  HENT: [place]/AT, PERRLA, no scleral icterus Mouth/Throat: Oropharynx is clear and moist. No oropharyngeal exudate.  Cardiovascular: Normal rate, regular rhythm and normal heart sounds. Exam reveals no gallop and no friction rub.  No murmur heard.  Pulmonary/Chest: Effort normal and breath sounds normal. No respiratory distress.  has no wheezes.  Neck = supple, no nuchal rigidity Abdominal:  Soft. Bowel sounds are normal.  exhibits no distension. There is no tenderness.  Lymphadenopathy: no cervical adenopathy. No axillary adenopathy Neurological: alert and oriented to person, place, and time.  Skin: Skin is warm and dry. No rash noted. No erythema.  Psychiatric: a normal mood and affect.  behavior is normal.    Lab Results Recent Labs    10/14/22 1425 10/15/22 0416  WBC 8.3 7.0  HGB 9.6* 9.7*  HCT 27.1* 28.1*  NA 130* 129*  K 3.0* 3.4*  CL 88* 87*  CO2 30 29  BUN 8 9  CREATININE 0.59 0.63    Microbiology: TISSUE   Special Requests MIDDLE INCISION LEFT HIP SPEC H  Gram Stain NO WBC SEEN NO ORGANISMS SEEN  Culture RARE STAPHYLOCOCCUS AUREUS RARE STAPHYLOCOCCUS EPIDERMIDIS   Studies/Results: DG Chest 2 View  Result Date: 10/14/2022 CLINICAL DATA:  Shortness of breath.  Nausea and vomiting today. EXAM: CHEST - 2 VIEW COMPARISON:  Radiographs 10/13/2022 and 10/01/2022.  CT 09/24/2022. FINDINGS: 0957 hours. The heart size and mediastinal contours are stable with aortic atherosclerosis. A right arm PICC projects to the lower SVC level. There are persistent left greater than right pleural effusions with associated bibasilar airspace opacities. Component in the left perihilar region appears mildly progressive. Grossly stable underlying right middle lobe pulmonary nodule as correlated  with prior CT. No acute osseous findings. IMPRESSION: 1. Persistent bibasilar airspace opacities and pleural effusions suspicious for multilobar pneumonia. Left perihilar component appears slightly worse. 2. Underlying right middle lobe nodule as seen on prior chest CT, potentially malignant. Electronically Signed   By: Richardean Sale M.D.   On: 10/14/2022 10:44   DG Abd 1 View  Result Date: 10/14/2022 CLINICAL DATA:  Nausea and vomiting EXAM: ABDOMEN - 1 VIEW COMPARISON:  Radiograph 10/02/2022 FINDINGS: There is mild gas distention of bowel in a nonobstructive pattern. Right upper quadrant  calcifications compatible with cholelithiasis. Pelvic phleboliths. IMPRESSION: Mild gaseous distension of bowel in a nonobstructive pattern. Electronically Signed   By: Maurine Simmering M.D.   On: 10/14/2022 10:40   DG CHEST PORT 1 VIEW  Result Date: 10/13/2022 CLINICAL DATA:  PICC line placement EXAM: PORTABLE CHEST 1 VIEW COMPARISON:  Portable exam 1653 hours compared to 10/01/2022 FINDINGS: RIGHT arm PICC line tip projects over SVC. Enlargement of cardiac silhouette. Atherosclerotic calcification aorta. Bibasilar effusions and atelectasis, greater on LEFT. Cannot exclude consolidation in LEFT lower lobe. Upper lungs clear. No pneumothorax. Osseous demineralization. Atherosclerotic calcifications at carotid bifurcations. IMPRESSION: Bibasilar effusions and atelectasis greater on LEFT; cannot exclude LEFT lower lobe pneumonia. Tip of RIGHT arm PICC line projects over SVC. Electronically Signed   By: Lavonia Dana M.D.   On: 10/13/2022 17:07     Assessment/Plan: MRSA and staph epi deep tissue infection = plan for 4 wk of IV daptomycin Will check sed rate and crp  Patchy infiltrate = unclear if it was fluid overload +/- aspiration pneumonia. Can treat with amox/clav '875mg'$  bid x 5 days if suspect aspiration pneumonia. Appears improving.  Will have appt for follow up as outpatient in 4 wk.  Diagnosis: MRSA and staph epi deep tissue infection of left thigh/previous IMN site  Culture Result: MRSA and staph epi  Allergies  Allergen Reactions   Aspirin Nausea Only    OPAT Orders Discharge antibiotics to be given via PICC line Discharge antibiotics: Per pharmacy protocol  daptomycin Aim for Vancomycin trough 15-20 or AUC 400-550 (unless otherwise indicated) Duration: 4 wk End Date: Dec 15th  [place] Per Protocol:  Home health RN for IV administration and teaching; PICC line care and labs.    Labs weekly while on IV antibiotics: __x CBC with differential _x_ BMP  __x CK  _x_ Please  pull PIC at completion of IV antibiotics  Fax weekly labs to [phone]  Clinic Follow Up Appt: 3-4 wk  '@[facility]'$    [facility] Pager: [phone]  10/15/2022, 3:52 PM

## 2022-10-15 NOTE — Progress Notes (Signed)
Patient ID: Anna Weiss, female   DOB: January 13, 1925, 88 y.o.   MRN: [id number]  Angeline Slim ID# [id number]  SW sent out SNF referral.   Loralee Pacas, MSW, [place] Office: [phone] Cell: [phone] Fax: [phone]

## 2022-10-15 NOTE — [place] FL2 (Signed)
[place] MEDICAID FL2 LEVEL OF CARE SCREENING TOOL     IDENTIFICATION  Patient Name: Anna Weiss Birthdate: 1925-11-11 Sex: female Admission Date (Current Location): 09/28/2022  [facility] and [place] Number:  Herbalist and Address:  [facility], [place] [address] [place], [place] [address]      Provider Number: [id number]  Attending Physician Name and Address:  Courtney Heys, MD  Relative Name and Phone Number:  [name] (daughter) [phone]    Current Level of Care: Hospital Recommended Level of Care: [place] Prior Approval Number:    Date Approved/Denied:   PASRR Number: [id number]  Discharge Plan: SNF    Current Diagnoses: Patient Active Problem List   Diagnosis Date Noted   Drainage from wound 10/10/2022   Fracture, intertrochanteric, left femur, sequela 09/28/2022   Sinus bradycardia 09/25/2022   Left lower lobe consolidation ([place]) 09/25/2022   Essential hypertension 09/25/2022   Left parietal scalp hematoma, initial encounter 09/25/2022   Displaced intertrochanteric fracture of left femur, initial encounter for closed fracture ([place]) 09/24/2022   Hip fracture ([place]) 09/24/2022   Rib fracture 04/08/2022   Lumbar compression fracture ([place]) 04/08/2022   Pain in right lower leg 05/14/2018    Orientation RESPIRATION BLADDER Height & Weight     Self, Time, Situation, Place  O2 Incontinent (I/O cath q4) Weight: 151 lb 7.3 oz (68.7 kg) Height:  '5\' 7"'$  (170.2 cm)  BEHAVIORAL SYMPTOMS/MOOD NEUROLOGICAL BOWEL NUTRITION STATUS      Continent    AMBULATORY STATUS COMMUNICATION OF NEEDS Skin   Limited Assist Verbally Skin abrasions (MASD to buttocks and skin tear to thighs)                       Personal Care Assistance Level of Assistance  Bathing, Dressing Bathing Assistance: Limited assistance   Dressing Assistance: Limited assistance     Functional Limitations Info  Sight, Hearing, Speech Sight  Info: Adequate Hearing Info: Adequate Speech Info: Adequate    SPECIAL CARE FACTORS FREQUENCY  PT (By licensed PT), OT (By licensed OT)     PT Frequency: 5xs per week OT Frequency: 5xs per week            Contractures Contractures Info: Not present    Additional Factors Info  Code Status, Allergies Code Status Info: DNR Allergies Info: See d/c instructions           Current Medications (10/15/2022):  This is the current hospital active medication list Current Facility-Administered Medications  Medication Dose Route Frequency Provider Last Rate Last Admin   0.9 %  sodium chloride infusion   Intravenous PRN Jennye Boroughs, MD 10 mL/hr at 10/12/22 0950 New Bag at 10/12/22 0950   acetaminophen (TYLENOL) tablet 650 mg  650 mg Oral Q6H PRN Callie Fielding, MD   650 mg at 10/14/22 2112   albuterol (PROVENTIL) (2.5 MG/3ML) 0.083% nebulizer solution 2.5 mg  2.5 mg Nebulization Q4H PRN Lovorn, Megan, MD       alum & mag hydroxide-simeth (MAALOX/MYLANTA) 200-200-20 MG/5ML suspension 30 mL  30 mL Oral Q4H PRN Callie Fielding, MD       antiseptic oral rinse (BIOTENE) solution 15 mL  15 mL Mouth Rinse PRN Callie Fielding, MD   15 mL at 09/30/22 2322   apixaban (ELIQUIS) tablet 2.5 mg  2.5 mg Oral BID Lovorn, Jinny Blossom, MD   2.5 mg at 10/15/22 0903   bisacodyl (DULCOLAX) suppository 10  mg  10 mg Rectal Daily PRN Callie Fielding, MD       Chlorhexidine Gluconate Cloth 2 % PADS 6 each  6 each Topical Daily Lovorn, Megan, MD   6 each at 10/15/22 0904   clopidogrel (PLAVIX) tablet 75 mg  75 mg Oral Daily Callie Fielding, MD   75 mg at 10/15/22 0904   DAPTOmycin (CUBICIN) 600 mg in sodium chloride 0.9 % IVPB  600 mg Intravenous Q2000 Carlyle Basques, MD 124 mL/hr at 10/14/22 2202 600 mg at 10/14/22 2202   feeding supplement (ENSURE ENLIVE / ENSURE PLUS) liquid 237 mL  237 mL Oral BID BM Callie Fielding, MD   237 mL at 10/13/22 0902   guaiFENesin (ROBITUSSIN) 100 MG/5ML liquid 5 mL  5 mL  Oral Q4H PRN Callie Fielding, MD   5 mL at 10/01/22 1147   hydrALAZINE (APRESOLINE) tablet 25 mg  25 mg Oral Q8H Callie Fielding, MD   25 mg at 10/15/22 1420   hydrochlorothiazide (HYDRODIURIL) tablet 25 mg  25 mg Oral Daily Callie Fielding, MD   25 mg at 10/15/22 6063   levothyroxine (SYNTHROID) tablet 50 mcg  50 mcg Oral Q0600 Callie Fielding, MD   50 mcg at 10/15/22 0903   lidocaine (XYLOCAINE) 2 % jelly 1 Application  1 Application Urethral PRN Callie Fielding, MD       lip balm (CARMEX) ointment   Topical PRN Donnamae Jude, RPH       magic mouthwash  10 mL Oral QID Callie Fielding, MD   10 mL at 10/15/22 0904   magnesium oxide (MAG-OX) tablet 400 mg  400 mg Oral BID Callie Fielding, MD   400 mg at 10/15/22 0160   methocarbamol (ROBAXIN) tablet 250 mg  250 mg Oral TID Callie Fielding, MD   250 mg at 10/15/22 1420   multivitamin with minerals tablet 1 tablet  1 tablet Oral Daily Callie Fielding, MD   1 tablet at 10/15/22 0903   pantoprazole (PROTONIX) EC tablet 40 mg  40 mg Oral BID Callie Fielding, MD   40 mg at 10/15/22 1093   polyvinyl alcohol (LIQUIFILM TEARS) 1.4 % ophthalmic solution 1 drop  1 drop Both Eyes BID Callie Fielding, MD   1 drop at 10/15/22 0904   potassium chloride (KLOR-CON M) CR tablet 10 mEq  10 mEq Oral Daily Donnamae Jude, RPH   10 mEq at 10/15/22 2355   prochlorperazine (COMPAZINE) injection 10 mg  10 mg Intravenous Q6H PRN Lovorn, Jinny Blossom, MD   10 mg at 10/14/22 1504   promethazine (PHENERGAN) suppository 12.5 mg  12.5 mg Rectal Q6H PRN Lovorn, Megan, MD       promethazine (PHENERGAN) tablet 12.5 mg  12.5 mg Oral Q6H PRN Lovorn, Megan, MD       simvastatin (ZOCOR) tablet 10 mg  10 mg Oral QPM Callie Fielding, MD   10 mg at 10/14/22 1726   sodium chloride flush (NS) 0.9 % injection 10-40 mL  10-40 mL Intracatheter PRN Lovorn, Megan, MD       sorbitol 70 % solution 30 mL  30 mL Oral Daily PRN Callie Fielding, MD       tamsulosin [facility]) capsule 0.4 mg  0.4 mg  Oral QPC supper Lovorn, Megan, MD   0.4 mg at 10/14/22 1726   traMADol (ULTRAM) tablet 50 mg  50 mg Oral Q6H PRN Callie Fielding, MD  50 mg at 10/14/22 2200     Discharge Medications: Please see discharge summary for a list of discharge medications.  Relevant Imaging Results:  Relevant Lab Results:   Additional Information VG#[id number]  AJHHI D [id number], LCSW

## 2022-10-15 NOTE — Progress Notes (Signed)
PHARMACY CONSULT NOTE FOR:  OUTPATIENT  PARENTERAL ANTIBIOTIC THERAPY (OPAT)  Indication: MRSA deep tissue infection Regimen: Daptomycin '600mg'$  IV q24h End date: 11/07/2022  IV antibiotic discharge orders are pended. To discharging provider:  please sign these orders via discharge navigator,  Select New Orders & click on the button choice - Manage This Unsigned Work.     Thank you for allowing pharmacy to be a part of this patient's care.  Sherlon Handing, PharmD, BCPS Please see amion for complete clinical pharmacist phone list 10/15/2022, 4:23 PM

## 2022-10-15 NOTE — Progress Notes (Signed)
Physical Therapy Session Note  Patient Details  Name: Anna Weiss MRN: [id number] Date of Birth: 05-03-1925  Today's Date: 10/15/2022 PT Individual Time: 0931-1216 PT Individual Time Calculation (min): 59 min   Short Term Goals: Week 2:  PT Short Term Goal 1 (Week 2): STG=LTG due to ELOS  Skilled Therapeutic Interventions/Progress Updates:      Therapy Documentation Precautions:  Precautions Precautions: Fall, Other (comment) Precaution Comments: monitor O2, orthostatics Restrictions Weight Bearing Restrictions: Yes LLE Weight Bearing: Weight bearing as tolerated  Pt agreeable to PT session with emphasis on bed mobility, transfer training and increasing activity tolerance. Pt received on 0.5 L of O2 and SPO2 >90 throughout session with mobility. Pt requires min A with supine to sit and performed sit to stand x 2 with RW and min A. PT provided total A for lower body dressing in standing position and pt able to tolerate standing for ~1 min. Pt requires min A for upper body dressing. Nursing arrived and administer pain medication, pt with unrated left LE pain. Pt requires min A with gait ~5 ft with RW to recliner and participated in following exercises to build strength and activity tolerance:   -Seated marches 3 x 10   -Seated LAQ 3 x 10   -Seated heel raises 3 x 10   -Gluteal sets 3 x 10  Pt left seated in recliner at bedside with all needs in reach and chair alarm on.   Therapy/Group: Individual Therapy  [name] [name] PT, DPT  10/15/2022, 7:51 AM

## 2022-10-15 NOTE — Progress Notes (Signed)
Occupational Therapy Session Note  Patient Details  Name: Anna Weiss MRN: [id number] Date of Birth: [birth year]  Today's Date: 10/15/2022 OT Individual Time: 1015-1200 OT Individual Time Calculation (min): 105 min (60 min sched and 45 min Make-up)    Short Term Goals: Week 3:  OT Short Term Goal 1 (Week 3): Pt will transfer bed to DABSC with mod A OT Short Term Goal 2 (Week 3): Pt will bathe sink side with min A using AE OT Short Term Goal 3 (Week 3): Pt will stand for 1 minute for simple ADL's with support with min A  Skilled Therapeutic Interventions/Progress Updates:    Pt received up in recliner for OT session. Now on Contact prec due to MRSA+. 1st since multiple days of medical issues and MD hold. Very mild residual edema R>L LE's. No report of nausea or dizziness. Pain 0/10 at rest 5/10 static standing then back to 0/10. Increased time for rest breaks due to effects of prolonged bed rest, all activity conducted in room as per pt request and 1st day participating since medical hold. Pt eager for sink side face washing therefore OT conducted grooming sink side in recliner with portable O2 seated and supported standing level for face washing, face toner, cream, hair combing and lip balm with CGA. Standing tolerance 1 min x 6 trials with seated rests with VSR in standing BP 139/56, SpO2 1/2 ltr via [place] 95% RR 18 HR 74. Pt reports B LE weakness and required min-mod A for standing. Pt then able to tolerate 1 lb bar weight therex B UE's 2 sets of 10 reps with demo, cues, and rests in between. Breathing integration training throughout activities and ex and training for CDP mngt and overall activity tolerance with 10 reps x 4 sets. Light beach ball seated for sh flex/ex x 3 sets of 12. Light beach ball toss 4 sets of 10 reps. Completed session with SpO2 remaining at 95% on ltrs of O2 via [place]. NT notified pt would like to sit OOB through lunch then return to bed. Pt dozing periodically at the end of  session and repositioned in mild recline with LE's elevated in recliner with seat alarm belt active, tray table and needs within reach.   Therapy Documentation Precautions:  Precautions Precautions: Fall, Other (comment) CONTACT  Precaution Comments: monitor O2, orthostatics Restrictions Weight Bearing Restrictions: Yes LLE Weight Bearing: Weight bearing as tolerated    Therapy/Group: Individual Therapy  [name] 10/15/2022, 7:40 AM

## 2022-10-15 NOTE — Progress Notes (Signed)
Occupational Therapy Note  Patient Details  Name: Anna Weiss MRN: [id number] Date of Birth: [birth year]  Pt moved to 15/7 schedule due to limited activity tolerance with medical complications. Will continue to re-assess.   [name] 10/15/2022, 7:36 AM

## 2022-10-16 LAB — BASIC METABOLIC PANEL
Anion gap: 14 (ref 5–15)
BUN: 8 mg/dL (ref 8–23)
CO2: 28 mmol/L (ref 22–32)
Calcium: 9 mg/dL (ref 8.9–10.3)
Chloride: 84 mmol/L — ABNORMAL LOW (ref 98–111)
Creatinine, Ser: 0.54 mg/dL (ref 0.44–1.00)
GFR, Estimated: >60 mL/min (ref >60)
Glucose, Bld: 109 mg/dL — ABNORMAL HIGH (ref 70–99)
Potassium: 3.2 mmol/L — ABNORMAL LOW (ref 3.5–5.1)
Sodium: 126 mmol/L — ABNORMAL LOW (ref 135–145)

## 2022-10-16 LAB — C-REACTIVE PROTEIN: CRP: 7.6 mg/dL — ABNORMAL HIGH (ref <1.0)

## 2022-10-16 LAB — MAGNESIUM: Magnesium: 1.7 mg/dL (ref 1.7–2.4)

## 2022-10-16 LAB — SEDIMENTATION RATE: Sed Rate: 21 mm/hr (ref 0–22)

## 2022-10-16 MED ORDER — TAMSULOSIN HCL 0.4 MG PO CAPS
0.8000 mg | ORAL_CAPSULE | Freq: Every day | ORAL | Status: DC
  Administered 2022-10-16 – 2022-10-27 (×7): 0.8 mg via ORAL
  Filled 2022-10-16 (×7): qty 2

## 2022-10-16 MED ORDER — POTASSIUM CHLORIDE CRYS ER 20 MEQ PO TBCR
20.0000 meq | EXTENDED_RELEASE_TABLET | Freq: Every day | ORAL | Status: DC
  Administered 2022-10-17 – 2022-10-20 (×3): 20 meq via ORAL
  Filled 2022-10-16 (×4): qty 1

## 2022-10-16 MED ORDER — POTASSIUM CHLORIDE CRYS ER 20 MEQ PO TBCR
40.0000 meq | EXTENDED_RELEASE_TABLET | Freq: Two times a day (BID) | ORAL | Status: AC
  Administered 2022-10-16 (×2): 40 meq via ORAL
  Filled 2022-10-16 (×2): qty 2

## 2022-10-16 NOTE — Progress Notes (Addendum)
Patient more restless in the bed and noted to have intermittent confusion this evening. Patient is alert and oriented when answering direct questions and follows commands appropriately. However, during conversation patient makes comments such as "I need to call the fire department to get some help", "my husband takes this medicine, I take something else", "Why are all these people in here?". Foley in place but urine noted to be more blood tinged this evening. Vitals stable. Setzer PA notified of new changes. New lab work order received. Continue to monitor.    11/24 0115 Patient remains confused this shift, picking at the air-has not slept. Vitals stable. PA Setzer notified and discussed labs. No new orders received. Continue to monitor.

## 2022-10-16 NOTE — Progress Notes (Signed)
PROGRESS NOTE   Subjective/Complaints:  Pt off O2 and doesn't have any SOB LBM overnight Ready to go back on regular diet Na 126- so will put on Fluid restriction 1500cc. ROS:  Pt denies SOB, abd pain, CP, N/V/C/D, and vision changes  Except per HPI   Objective:   DG Chest 2 View  Result Date: 10/14/2022 CLINICAL DATA:  Shortness of breath.  Nausea and vomiting today. EXAM: CHEST - 2 VIEW COMPARISON:  Radiographs 10/13/2022 and 10/01/2022.  CT 09/24/2022. FINDINGS: 0957 hours. The heart size and mediastinal contours are stable with aortic atherosclerosis. A right arm PICC projects to the lower SVC level. There are persistent left greater than right pleural effusions with associated bibasilar airspace opacities. Component in the left perihilar region appears mildly progressive. Grossly stable underlying right middle lobe pulmonary nodule as correlated with prior CT. No acute osseous findings. IMPRESSION: 1. Persistent bibasilar airspace opacities and pleural effusions suspicious for multilobar pneumonia. Left perihilar component appears slightly worse. 2. Underlying right middle lobe nodule as seen on prior chest CT, potentially malignant. Electronically Signed   By: Richardean Sale M.D.   On: 10/14/2022 10:44   DG Abd 1 View  Result Date: 10/14/2022 CLINICAL DATA:  Nausea and vomiting EXAM: ABDOMEN - 1 VIEW COMPARISON:  Radiograph 10/02/2022 FINDINGS: There is mild gas distention of bowel in a nonobstructive pattern. Right upper quadrant calcifications compatible with cholelithiasis. Pelvic phleboliths. IMPRESSION: Mild gaseous distension of bowel in a nonobstructive pattern. Electronically Signed   By: Maurine Simmering M.D.   On: 10/14/2022 10:40   Recent Labs    10/14/22 1425 10/15/22 0416  WBC 8.3 7.0  HGB 9.6* 9.7*  HCT 27.1* 28.1*  PLT 270 285    Recent Labs    10/15/22 0416 10/16/22 0500  NA 129* 126*  K 3.4* 3.2*   CL 87* 84*  CO2 29 28  GLUCOSE 100* 109*  BUN 9 8  CREATININE 0.63 0.54  CALCIUM 8.6* 9.0    Intake/Output Summary (Last 24 hours) at 10/16/2022 0847 Last data filed at 10/16/2022 0523 Gross per 24 hour  Intake 722 ml  Output 975 ml  Net -253 ml        Physical Exam: Vital Signs Blood pressure (!) 153/53, pulse 64, temperature 97.9 F (36.6 C), temperature source Oral, resp. rate 18, height 5' 7" (1.702 m), weight 68.7 kg, SpO2 91 %.         General: awake, alert, appropriate, sitting up in bed; NAD HENT: conjugate gaze; oropharynx dry- gave fluids; better CV: regular rate; no JVD Pulmonary: CTA B/L; no W/R/R- good air movement except decreased at bases GI: soft, NT, ND, (+)BS- hypoactive Psychiatric: appropriate- brighter affect Neurological: Ox3-  Skin- has surgical dressing in place with a little dried blood on dressing- can see without removing dressing Musculoskeletal:     Cervical back: Normal range of motion.     Comments: Left hip tender, still swollen - bruised- mainly yellow-  much better- foam dressing over L hip C/D/I Skin: L hip less swelling; dressing C/D/I    General: Skin is warm.     Findings: Bruising (arms) present.     Comments:  Left hip incision CDI with dry dressing- no drainage through dressing  Left ear as above. L ear we've removed bolster- still really bruised  Neurological:     Mental Status: She is alert and oriented to person, place, and time.     Cranial Nerves: No cranial nerve deficit.     Sensory: No sensory deficit.     Comments: Motor: 4-5/5 UE's. RLE 3/5 prox to 5/5 distal. LLE 1-2/5 (pain) to 4/5 distally. DTR's 1+, no abnl tone or ataxia   Assessment/Plan: 1. Functional deficits which require 3+ hours per day of interdisciplinary therapy in a comprehensive inpatient rehab setting. Physiatrist is providing close team supervision and 24 hour management of active medical problems listed below. Physiatrist and rehab team  continue to assess barriers to discharge/monitor patient progress toward functional and medical goals  Care Tool:  Bathing    Body parts bathed by patient: Right arm, Left arm, Chest, Abdomen, Front perineal area, Right upper leg, Face, Left upper leg   Body parts bathed by helper: Left lower leg, Right lower leg, Buttocks     Bathing assist Assist Level: Maximal Assistance - Patient 24 - 49%     Upper Body Dressing/Undressing Upper body dressing   What is the patient wearing?: Pull over shirt    Upper body assist Assist Level: Minimal Assistance - Patient > 75%    Lower Body Dressing/Undressing Lower body dressing      What is the patient wearing?: Incontinence brief, Pants     Lower body assist Assist for lower body dressing: Maximal Assistance - Patient 25 - 49%     Toileting Toileting    Toileting assist Assist for toileting: Maximal Assistance - Patient 25 - 49%     Transfers Chair/bed transfer  Transfers assist  Chair/bed transfer activity did not occur: Safety/medical concerns (dizziness, pain, decreased balance)  Chair/bed transfer assist level: Minimal Assistance - Patient > 75%     Locomotion Ambulation   Ambulation assist   Ambulation activity did not occur: Safety/medical concerns (dizziness, pain, decreased balance)  Assist level: Minimal Assistance - Patient > 75% Assistive device: Walker-rolling Max distance: 38   Walk 10 feet activity   Assist  Walk 10 feet activity did not occur: Safety/medical concerns (dizziness, pain, decreased balance)  Assist level: Minimal Assistance - Patient > 75% Assistive device: Walker-rolling   Walk 50 feet activity   Assist Walk 50 feet with 2 turns activity did not occur: Safety/medical concerns         Walk 150 feet activity   Assist Walk 150 feet activity did not occur: Safety/medical concerns         Walk 10 feet on uneven surface  activity   Assist Walk 10 feet on uneven  surfaces activity did not occur: Safety/medical concerns (dizziness, pain, decreased balance)         Wheelchair     Assist Is the patient using a wheelchair?: Yes Type of Wheelchair: Manual Wheelchair activity did not occur: Safety/medical concerns (dizziness, pain, decreased balance)         Wheelchair 50 feet with 2 turns activity    Assist    Wheelchair 50 feet with 2 turns activity did not occur: Safety/medical concerns (dizziness, pain, decreased balance)       Wheelchair 150 feet activity     Assist  Wheelchair 150 feet activity did not occur: Safety/medical concerns (dizziness, pain, decreased balance)       Blood pressure (!) 153/53, pulse 64, temperature  97.9 F (36.6 C), temperature source Oral, resp. rate 18, height _0  (1.702 m), weight 68.7 kg, SpO2 91 %.  Medical Problem List and Plan: 1. Functional deficits secondary to left intertrochanteric femur fx after fall. Pt s/p IMN 09/25/22             -LLE WBAT             -patient may shower             -ELOS/Goals: 15-20 days  Team conference today to determine how much longer to keep pt  Con't CIR- back to therapy today- set d/c date as 12/29 hopefully will regain her strength before then 2.  Antithrombotics: -DVT/anticoagulation:  xarelto 61m daily for 14 days 11/21- restarted Eliquis 2.5 mg BID for DVT prophylaxis-              -antiplatelet therapy:  resume plavix 765mdaily 3. Pain Management: tylenol and hydrocodone prn for pain             -robaxin prn for muscle spasms  11/8- pain limiting therapy- doesn't want opiates- mainly using tylenol-   11/9- d/w pt- she's willing to try tramadol 50 mg q6 hours prn- she's scared of Coding if takes Norco- I explained that Tramadol is mild and shouldn't cause any cognitive issues.   11/10- pain much better- not resolved, but better with tramadol- con't tramadol and tylenol prn  11/14- pain still doing better with tramadol  11/17- is worse today  since had surgery yesterday-doesn't want any other meds  11/18 reports pain controlled, continue tramadol PRN 4. Mood/Behavior/Sleep: team to provide ego support             -antipsychotic agents: n/a 5. Neuropsych/cognition: This patient is capable of making decisions on her own behalf. 6. Skin/Wound Care: dry dressings to left ear and hip  11/9- having some drainage- from L hip- is improved this Am- will monitor closely.  7. Fluids/Electrolytes/Nutrition: encourage appropriate PO             -pt's intake has inconsistent so far 8. Nausea:              -might be positional or med related.              -also having associated gas             -pt told me she's been moving bowels, however I don't see a bm recorded             -will try sorbitol today, dulcolax suppository prn             -protonix 11/8- has ileus- per #15 below  11/9- still having nausea- will change diet to clear liquid per pt request- and monitor  11/10- on clear liquid diet- pt wants to wait 1 more day before changing to regular diet- if not better tomorrow, will need to call GI  11/14- nausea resolved- back on regular diet  11/16- nauseated again  111/7- Put on IVFs for another 24 hours at 75cc/hour since had surgery at 10-11pm at night and didn't get back from PACU until 1am- sh'e sleepy and not taking in much  11/18 LBM today, improved continue to monitor 11/20- LBM last night  11.21- checking KUB and ordered clear liquid diet- also put on IVFs, but holding until CXR back- since not drinking a lot  11/22- will stop IVFs- is drinking enough per labs- pt want sot wait to go  back to regular until tomorrow  11/23- back to regular diet- with 1500cc fluid restriction 9. Left lower lobe consolidation             -pneumonia ruled out. Likely atelectasis             -IS, OOB             -wean oxygen to off  11/7- will write order to wean O2.  10. Sinus bradycardia:             -on propranolol and verapamil at home              -continue to hold as HR in 50's  11/14- since still in 50s, will not restart  11. Essential HTN             -continue hctz  11/14- BP usually controlled but up today- will monitor for trend  11/15- somewhat elevated, but not sure if dropping- see #21           11/16- started hydralazine  11/17- just got hydralazine- is slightly better- might need it increased over weekend  -11/19 good control overall continue to monitor  11/20- somewhat variable- due to ABLA likely  11/22- doing better Vitals:   10/16/22 0259 10/16/22 0526  BP:  (!) 153/53  Pulse:  64  Resp:  18  Temp:  97.9 F (36.6 C)  SpO2: 91% 91%    12. Right middle lobe nodule 2.3 cm- should be considered primary lung CA until ruled out- also has nodule as well 56m on R middle lung- could be mets per CT scan.              -f/u as outpt  11/7- d/w daughter- she said no f/u at this time- due to pt's age.   11/8- daughter doesn't want to let pt know about tumors.   13. Mild leukocytosis- 11.2   11/6- afebrile- other than difficulty swallowing- says it's dry?; denies UTI Sx's- if gets worse clinically, will recheck in AM- right now scheduled for Wed AM.   11/8- WBC down to 9.4-  14. Constipation  11/6- LBM on day of fall per pt and chart- will give Sorbitol after therapy and follow with SSE if needed.   11/7- had multiple BM's- didn't need SSE- per notes- feels cleaned out   11/19 LBM today, improved 15. ABLA  11/6- Down to 7.6- will recheck Wednesday to make sure not dropping further- and transfuse if required.   11/8- Hb 7.3- on edge of needing transfusion.   11/9- Hb up to 8.0-   11/10- Hb 7.7- will con't to monitor closely.   11/15- Hb 8.4  11/20- Hb 6.4- will transfuse as below 16. Post op ileus resolved BM x 3 11/10, 1 BM 11/12- advance diet as tolerated   -Recheck level tomorrow  11/21- New n/V-- ordered another KUB to look for ileus and put back on clear liquid diet- also ordered IV compazine, PO/PR phenergan  and d/c'd Zofran since can cause constipation  11/22- wants ot wait til tomorrow to go back to regular diet- but can be changed anytime pt wants to back to regular diet 17. Thrush?  11/9- will add Magic mouthwash- don't see plaques, but tongue dry and furrowed. Says things taste 'wierd".   11/10- said magic mouthwash helping Sx's.  18. Leukocytosis improved -   11/15- WBC down to 7.8  11/16- will recheck today to make sure not elevated  11/17- WBC  stable at [facility] tomorrow  11/20- WBC down to 6.8  11/23- CRP 7.6- and ESR 21- doing great    Latest Ref Rng & Units 10/15/2022    4:16 AM 10/14/2022    2:25 PM 10/13/2022    7:14 AM  CBC  WBC 4.0 - 10.5 K/uL 7.0  8.3  6.8   Hemoglobin 12.0 - 15.0 g/dL 9.7  9.6  6.4  C  Hematocrit 36.0 - 46.0 % 28.1  27.1  19.0   Platelets 150 - 400 K/uL 285  270  276     C Corrected result    19. Hypokalemia and low Mg  11/10- repleted up to 3.5- will put on Kcl 10 mEq daily for now and recheck Monday- since K+ still  3.5- will also check Mg Monday as well   11/14= Mg 1.6 and K+ 3.1- repleted again  yesterday by pharmacy for [place]- will recheck labs iN AM  11/15- K+ 4.2- will give IV Mg 2G for Mg 1.6- and recheck in AM  11/16- Mg 1.7- still low- will put on PO Mg 400 mg BID  Recheck K+ and MG tomorrow  11/20- K+ 3.8 and Mg 1.8- will con't regimen  11/22- K+ 3.4- repleted again IV- repleted yesterday IV since was 3.0-   11/23- KCL- will replete since K+ 3.2 today- will give 40 mEq x2 and recheck in AM- will also change daily KCL to 20 mEq- Mg level 1.7 20. Swallowing issues  11/14- appears to be thrush- will start Diflucan- due to renal issues, will need to be 50 mg daily- x 7 days doses-   11/15- reports things taste better and was able ot swallow some this AM of breakfast-con't diflucan  11/19 reports improved, continue to monitor  11/23- resolved- Diflucan finished 21. Dizziness/lightheadedness/presyncope  11/15- will have pt get orthostatics  checked by nursing and therapy- BP on high side, so not sure if Sx's due to BP being high or dropping.   11/16- added hydralazine 25 mg TID- is somewhat better today  11/20- BP a little on low side- likely due to ABLA- will hold BP meds if need be.  22. Infection of L hip?  11/16- still having drainage from L hip- spoke to Ortho- they are taking her to OR today at 5pm for wash out-   11/17- Gram stains negative so far- waiting for Cx's- surgeon gave Ancef x2 IV and will monitor Cultures  11/19 wound culture growing staph,Continue vancomycin, surgery advising ID consult, called ID to discuss -consult placed  11/20- con't IV Vanc per ID- Cx's still pending final. 11/21- changed to Daptomycin, per ID- will double check labs today- CK is pending- didn't get done yesterday- will check labs today as well  11/22- CK looking good- 23- con't Daptomycin- stop date is 11/07/22 per ID 23. Urinary retention  11/16- is new- will check U/A and Cx- and labs- CBC- with diff and CMP just to make sure no other issues  11/17- U/A (-) and doesn't have elevated WBC- mild left shift however- will start Flomax 0.4 mg q supper  11/8 continent PVRs a little improved today< 382m, continue to monitor, continue flomax, IC if > 350 ml  11/23- placed foley since having such high volumes and will leave in til early next week- will try voiding trial Monday/Tuesday- will also increase Flomax to 0.8 q supper 24. ABLA anemia-  11/20- down to 6.4- will transfuse 2 units since so low.    11/21-  labs today- pending  11/22- Hb 9.7 25. SOB  11/21- pt c/o SOB- O2 sats 98% on 2L- has been on O2 since surgery last week- will check CXR and gave IV Lasix 40 mg x1 - and let pt know we need her to void. If CXR Negative, will need CTA of chest to look for PE.   11/22- down to 0.5 L of O2- and satting well- actually CO2 was too high on 2L- so reduced due to CO2 retention- SOB improved- of note, no leukocytosis  11/23- off O2 completely- and  satting well 26. Hyponatremia  11/23- Na down to 126- will put on fluid restriction of 1500cc and recheck in AM- if doesn't improve, will consult IM vs renal in AM  I spent a total of  36  minutes on total care today- >50% coordination of care- due to d/w nursing about fluid restriction and review of labs independently as well as medical decision making.    LOS: 18 days A FACE TO FACE EVALUATION WAS PERFORMED  Brigitt Mcclish 10/16/2022, 8:47 AM

## 2022-10-16 NOTE — Progress Notes (Signed)
Orthopedic Surgery Post-operative Note   Assessment: Patient is a 88 y.o. female with left intertrochanteric femur fracture status post IMN (09/25/2022), developed persistent drainage and now s/p I&D (10/09/2022)   Plan: -Operative plans: complete -Infectious disease consulted, appreciate recs -Keep dressing dry and in place at all times -Will follow intra-operative cultures (staph species) -Diet: regular -DVT ppx: xarelto -Antibiotics: daptomycin -Weight bearing status: as tolerated -PT/OT evaluate and treat -Pain control -Dispo: per primary  ___________________________________________________________________________  Subjective: No acute events overnight. Nausea has resolved. Hip pain continues to get better. Was able to walk to the bathroom yesterday without much pain. Dressings have not been replaced since the last time I saw her. Denies paresthesias and numbness.    Physical Exam:  General: no acute distress, laying in bed, appears stated age Respiratory: unlabored breathing, symmetric chest rise Neurologic: alert, orientated, following commands   MSK:   -Left lower extremity  Dressings c/d/i EHL/TA/GSC intact Sensation intact to light touch in sural/saphenous/deep peroneal/superficial peroneal/tibial nerve distributions Foot warm and well perfused   Patient name: KENNETH LAX Patient MRN: [id number] Date: 10/16/22

## 2022-10-16 NOTE — Progress Notes (Addendum)
Was notified by RN of onset of confusion. In no distress and otherwise calm. Reviewed all recent labs. VSS, systolic BP elevated. She is afebrile. Re-check serum sodium now. Recommend continued efforts at re-directing and close observation for escalating signs/symptoms.      Latest Ref Rng & Units 10/16/2022   11:25 PM 10/16/2022    5:00 AM 10/15/2022    4:16 AM  BMP  Glucose 70 - 99 mg/dL 110  109  100   BUN 8 - 23 mg/dL '9  8  9   '$ Creatinine 0.44 - 1.00 mg/dL 0.61  0.54  0.63   Sodium 135 - 145 mmol/L 125  126  129   Potassium 3.5 - 5.1 mmol/L 3.6  3.2  3.4   Chloride 98 - 111 mmol/L 84  84  87   CO2 22 - 32 mmol/L '27  28  29   '$ Calcium 8.9 - 10.3 mg/dL 8.8  9.0  8.6     Follow-up AM labs.

## 2022-10-17 ENCOUNTER — Inpatient Hospital Stay (HOSPITAL_COMMUNITY): Payer: Medicare Other

## 2022-10-17 LAB — CBC WITH DIFFERENTIAL/PLATELET
Abs Immature Granulocytes: 0.04 10*3/uL (ref 0.00–0.07)
Basophils Absolute: 0 10*3/uL (ref 0.0–0.1)
Basophils Relative: 0 %
Eosinophils Absolute: 0 10*3/uL (ref 0.0–0.5)
Eosinophils Relative: 0 %
HCT: 29.4 % — ABNORMAL LOW (ref 36.0–46.0)
Hemoglobin: 9.9 g/dL — ABNORMAL LOW (ref 12.0–15.0)
Immature Granulocytes: 0 %
Lymphocytes Relative: 3 %
Lymphs Abs: 0.4 10*3/uL — ABNORMAL LOW (ref 0.7–4.0)
MCH: 34.6 pg — ABNORMAL HIGH (ref 26.0–34.0)
MCHC: 33.7 g/dL (ref 30.0–36.0)
MCV: 102.8 fL — ABNORMAL HIGH (ref 80.0–100.0)
Monocytes Absolute: 1.2 10*3/uL — ABNORMAL HIGH (ref 0.1–1.0)
Monocytes Relative: 11 %
Neutro Abs: 9 10*3/uL — ABNORMAL HIGH (ref 1.7–7.7)
Neutrophils Relative %: 86 %
Platelets: 315 10*3/uL (ref 150–400)
RBC: 2.86 MIL/uL — ABNORMAL LOW (ref 3.87–5.11)
RDW: 19.7 % — ABNORMAL HIGH (ref 11.5–15.5)
WBC: 10.6 10*3/uL — ABNORMAL HIGH (ref 4.0–10.5)
nRBC: 0 % (ref 0.0–0.2)

## 2022-10-17 LAB — BASIC METABOLIC PANEL
Anion gap: 14 (ref 5–15)
BUN: 9 mg/dL (ref 8–23)
CO2: 27 mmol/L (ref 22–32)
Calcium: 8.8 mg/dL — ABNORMAL LOW (ref 8.9–10.3)
Chloride: 84 mmol/L — ABNORMAL LOW (ref 98–111)
Creatinine, Ser: 0.61 mg/dL (ref 0.44–1.00)
GFR, Estimated: >60 mL/min (ref >60)
Glucose, Bld: 110 mg/dL — ABNORMAL HIGH (ref 70–99)
Potassium: 3.6 mmol/L (ref 3.5–5.1)
Sodium: 125 mmol/L — ABNORMAL LOW (ref 135–145)

## 2022-10-17 LAB — MAGNESIUM: Magnesium: 1.5 mg/dL — ABNORMAL LOW (ref 1.7–2.4)

## 2022-10-17 LAB — BRAIN NATRIURETIC PEPTIDE: B Natriuretic Peptide: 236 pg/mL — ABNORMAL HIGH (ref 0.0–100.0)

## 2022-10-17 LAB — URINALYSIS, MICROSCOPIC (REFLEX)

## 2022-10-17 LAB — URINALYSIS, ROUTINE W REFLEX MICROSCOPIC
Bilirubin Urine: NEGATIVE
Glucose, UA: NEGATIVE mg/dL
Ketones, ur: >80 mg/dL — AB
Leukocytes,Ua: NEGATIVE
Nitrite: POSITIVE — AB
Protein, ur: >300 mg/dL — AB
Specific Gravity, Urine: 1.025 (ref 1.005–1.030)
pH: 6.5 (ref 5.0–8.0)

## 2022-10-17 MED ORDER — SODIUM CHLORIDE 0.9 % IV SOLN: INTRAVENOUS | Status: AC

## 2022-10-17 MED ORDER — SODIUM CHLORIDE 0.9 % IV SOLN
500.0000 mL | Freq: Once | INTRAVENOUS | Status: AC
  Administered 2022-10-17: 500 mL via INTRAVENOUS

## 2022-10-17 MED ORDER — SODIUM CHLORIDE 0.9 % IV SOLN
1.0000 g | INTRAVENOUS | Status: AC
  Administered 2022-10-17 – 2022-10-21 (×5): 1 g via INTRAVENOUS
  Filled 2022-10-17 (×5): qty 10

## 2022-10-17 MED ORDER — CHLORHEXIDINE GLUCONATE CLOTH 2 % EX PADS
6.0000 | MEDICATED_PAD | Freq: Two times a day (BID) | CUTANEOUS | Status: DC
  Administered 2022-10-17 – 2022-10-28 (×22): 6 via TOPICAL

## 2022-10-17 NOTE — Progress Notes (Signed)
PROGRESS NOTE   Subjective/Complaints:  Pt back on )2 0.5 to 1 L O2- C/O SOB- kept trying to take albuterol nebs off this AM.  More confused per staff.   Got CXR STAT and BNP- slightly elevated BMP shows 125 Na which explained confusion as well as L shift in WBC.  Pt says seeing things- and reaching out per staff constantly to grab something in air.   No fever.   ROS:  Limited by cognition Except per HPI   Objective:   DG Chest 2 View  Result Date: 10/17/2022 CLINICAL DATA:  Shortness of breath EXAM: CHEST - 2 VIEW COMPARISON:  10/14/2022 and prior studies FINDINGS: Cardiomediastinal silhouette is unchanged. A RIGHT PICC line is again identified with tip overlying the LOWER SVC. Bilateral interstitial opacities, bilateral LOWER lung opacities/atelectasis and bilateral pleural effusions again noted. LEFT perihilar opacity has slightly improved. There is no evidence of pneumothorax. IMPRESSION: Slightly improved LEFT perihilar opacity/atelectasis. Otherwise unchanged appearance of the chest with bilateral interstitial opacities, bilateral LOWER lung opacities/atelectasis and bilateral pleural effusions. Electronically Signed   By: Margarette Canada M.D.   On: 10/17/2022 08:27   Recent Labs    10/15/22 0416 10/17/22 0920  WBC 7.0 10.6*  HGB 9.7* 9.9*  HCT 28.1* 29.4*  PLT 285 315    Recent Labs    10/16/22 0500 10/16/22 2325  NA 126* 125*  K 3.2* 3.6  CL 84* 84*  CO2 28 27  GLUCOSE 109* 110*  BUN 8 9  CREATININE 0.54 0.61  CALCIUM 9.0 8.8*    Intake/Output Summary (Last 24 hours) at 10/17/2022 1145 Last data filed at 10/17/2022 0849 Gross per 24 hour  Intake 1268.71 ml  Output 350 ml  Net 918.71 ml     Pressure Injury 10/16/22 Ischial tuberosity Right Stage 2 -  Partial thickness loss of dermis presenting as a shallow open injury with a red, pink wound bed without slough. (Active)  10/16/22 0900  Location:  Ischial tuberosity  Location Orientation: Right  Staging: Stage 2 -  Partial thickness loss of dermis presenting as a shallow open injury with a red, pink wound bed without slough.  Wound Description (Comments):   Present on Admission: No     Pressure Injury 10/16/22 Right Stage 2 -  Partial thickness loss of dermis presenting as a shallow open injury with a red, pink wound bed without slough. (Active)  10/16/22 0900  Location:   Location Orientation: Right  Staging: Stage 2 -  Partial thickness loss of dermis presenting as a shallow open injury with a red, pink wound bed without slough.  Wound Description (Comments):   Present on Admission: No    Physical Exam: Vital Signs Blood pressure (!) 161/55, pulse 70, temperature 98.3 F (36.8 C), temperature source Oral, resp. rate 20, height _0  (1.702 m), weight 70 kg, SpO2 95 %.          General: awake, alert, getting albuterol nebs- kept trying to take nebs treatment off; NAD HENT: conjugate gaze; oropharynx dry- CV: regular rate; no JVD Pulmonary: good air movement upper lobes, but lower lobes, decreased air movement- no W/R/R after Neb treatment GI: soft, NT,  ND, (+)BS Psychiatric: inappropriate/confused Neurological: confused- reaching for things not in air Musculoskeletal:     Cervical back: Normal range of motion.     Comments: Left hip tender, still swollen - bruised- mainly yellow-  much better- foam dressing over L hip C/D/I Skin: L hip less swelling; dressing C/D/I    General: Skin is warm.     Findings: Bruising (arms) present.     Comments: Left hip incision CDI with dry dressing- no drainage through dressing  Left ear as above. L ear we've removed bolster- still really bruised  Neurological:     Mental Status: She is alert and oriented to person, place, and time.     Cranial Nerves: No cranial nerve deficit.     Sensory: No sensory deficit.     Comments: Motor: 4-5/5 UE's. RLE 3/5 prox to 5/5 distal. LLE  1-2/5 (pain) to 4/5 distally. DTR's 1+, no abnl tone or ataxia   Assessment/Plan: 1. Functional deficits which require 3+ hours per day of interdisciplinary therapy in a comprehensive inpatient rehab setting. Physiatrist is providing close team supervision and 24 hour management of active medical problems listed below. Physiatrist and rehab team continue to assess barriers to discharge/monitor patient progress toward functional and medical goals  Care Tool:  Bathing    Body parts bathed by patient: Right arm, Left arm, Chest, Abdomen, Front perineal area, Right upper leg, Face, Left upper leg   Body parts bathed by helper: Left lower leg, Right lower leg, Buttocks     Bathing assist Assist Level: Maximal Assistance - Patient 24 - 49%     Upper Body Dressing/Undressing Upper body dressing   What is the patient wearing?: Pull over shirt    Upper body assist Assist Level: Minimal Assistance - Patient > 75%    Lower Body Dressing/Undressing Lower body dressing      What is the patient wearing?: Incontinence brief, Pants     Lower body assist Assist for lower body dressing: Maximal Assistance - Patient 25 - 49%     Toileting Toileting    Toileting assist Assist for toileting: Maximal Assistance - Patient 25 - 49%     Transfers Chair/bed transfer  Transfers assist  Chair/bed transfer activity did not occur: Safety/medical concerns (dizziness, pain, decreased balance)  Chair/bed transfer assist level: Minimal Assistance - Patient > 75%     Locomotion Ambulation   Ambulation assist   Ambulation activity did not occur: Safety/medical concerns (dizziness, pain, decreased balance)  Assist level: Minimal Assistance - Patient > 75% Assistive device: Walker-rolling Max distance: 38   Walk 10 feet activity   Assist  Walk 10 feet activity did not occur: Safety/medical concerns (dizziness, pain, decreased balance)  Assist level: Minimal Assistance - Patient >  75% Assistive device: Walker-rolling   Walk 50 feet activity   Assist Walk 50 feet with 2 turns activity did not occur: Safety/medical concerns         Walk 150 feet activity   Assist Walk 150 feet activity did not occur: Safety/medical concerns         Walk 10 feet on uneven surface  activity   Assist Walk 10 feet on uneven surfaces activity did not occur: Safety/medical concerns (dizziness, pain, decreased balance)         Wheelchair     Assist Is the patient using a wheelchair?: Yes Type of Wheelchair: Manual Wheelchair activity did not occur: Safety/medical concerns (dizziness, pain, decreased balance)  Wheelchair 50 feet with 2 turns activity    Assist    Wheelchair 50 feet with 2 turns activity did not occur: Safety/medical concerns (dizziness, pain, decreased balance)       Wheelchair 150 feet activity     Assist  Wheelchair 150 feet activity did not occur: Safety/medical concerns (dizziness, pain, decreased balance)       Blood pressure (!) 161/55, pulse 70, temperature 98.3 F (36.8 C), temperature source Oral, resp. rate 20, height _0  (1.702 m), weight 70 kg, SpO2 95 %.  Medical Problem List and Plan: 1. Functional deficits secondary to left intertrochanteric femur fx after fall. Pt s/p IMN 09/25/22             -LLE WBAT             -patient may shower             -ELOS/Goals: 15-20 days  Con't CIR- PT and OT- overall, but hold today due to medical issues 2.  Antithrombotics: -DVT/anticoagulation:  xarelto 42m daily for 14 days 11/21- restarted Eliquis 2.5 mg BID for DVT prophylaxis-              -antiplatelet therapy:  resume plavix 753mdaily 3. Pain Management: tylenol and hydrocodone prn for pain             -robaxin prn for muscle spasms  11/8- pain limiting therapy- doesn't want opiates- mainly using tylenol-   11/9- d/w pt- she's willing to try tramadol 50 mg q6 hours prn- she's scared of Coding if takes  Norco- I explained that Tramadol is mild and shouldn't cause any cognitive issues.   11/10- pain much better- not resolved, but better with tramadol- con't tramadol and tylenol prn  11/14- pain still doing better with tramadol  11/17- is worse today since had surgery yesterday-doesn't want any other meds  11/18 reports pain controlled, continue tramadol PRN 4. Mood/Behavior/Sleep: team to provide ego support             -antipsychotic agents: n/a 5. Neuropsych/cognition: This patient is capable of making decisions on her own behalf. 6. Skin/Wound Care: dry dressings to left ear and hip  11/9- having some drainage- from L hip- is improved this Am- will monitor closely.  7. Fluids/Electrolytes/Nutrition: encourage appropriate PO             -pt's intake has inconsistent so far 8. Nausea:              -might be positional or med related.              -also having associated gas             -pt told me she's been moving bowels, however I don't see a bm recorded             -will try sorbitol today, dulcolax suppository prn             -protonix 11/8- has ileus- per #15 below  11/9- still having nausea- will change diet to clear liquid per pt request- and monitor  11/10- on clear liquid diet- pt wants to wait 1 more day before changing to regular diet- if not better tomorrow, will need to call GI  11/14- nausea resolved- back on regular diet  11/16- nauseated again  111/7- Put on IVFs for another 24 hours at 75cc/hour since had surgery at 10-11pm at night and didn't get back from PACU until 1am- sh'e sleepy and  not taking in much  11/18 LBM today, improved continue to monitor 11/20- LBM last night  11.21- checking KUB and ordered clear liquid diet- also put on IVFs, but holding until CXR back- since not drinking a lot  11/22- will stop IVFs- is drinking enough per labs- pt want sot wait to go back to regular until tomorrow  11/23- back to regular diet- with 1500cc fluid restriction  11/24-  placed IVFs x 2 4 hours and recheck in AM 9. Left lower lobe consolidation             -pneumonia ruled out. Likely atelectasis             -IS, OOB             -wean oxygen to off  11/7- will write order to wean O2.  10. Sinus bradycardia:             -on propranolol and verapamil at home             -continue to hold as HR in 50's  11/14- since still in 50s, will not restart  11. Essential HTN             -continue hctz  11/14- BP usually controlled but up today- will monitor for trend  11/15- somewhat elevated, but not sure if dropping- see #21           11/16- started hydralazine  11/17- just got hydralazine- is slightly better- might need it increased over weekend  -11/19 good control overall continue to monitor  11/20- somewhat variable- due to ABLA likely  11/22- doing better Vitals:   10/17/22 0400 10/17/22 0533  BP: (!) 161/55   Pulse: 70   Resp: 20   Temp: 98.3 F (36.8 C)   SpO2: 93% 95%    12. Right middle lobe nodule 2.3 cm- should be considered primary lung CA until ruled out- also has nodule as well 75m on R middle lung- could be mets per CT scan.              -f/u as outpt  11/7- d/w daughter- she said no f/u at this time- due to pt's age.   11/8- daughter doesn't want to let pt know about tumors.   13. Mild leukocytosis- 11.2   11/6- afebrile- other than difficulty swallowing- says it's dry?; denies UTI Sx's- if gets worse clinically, will recheck in AM- right now scheduled for Wed AM.   11/8- WBC down to 9.4-  14. Constipation  11/6- LBM on day of fall per pt and chart- will give Sorbitol after therapy and follow with SSE if needed.   11/7- had multiple BM's- didn't need SSE- per notes- feels cleaned out   11/19 LBM today, improved 15. ABLA  11/6- Down to 7.6- will recheck Wednesday to make sure not dropping further- and transfuse if required.   11/8- Hb 7.3- on edge of needing transfusion.   11/9- Hb up to 8.0-   11/10- Hb 7.7- will con't to monitor  closely.   11/15- Hb 8.4  11/20- Hb 6.4- will transfuse as below 16. Post op ileus resolved BM x 3 11/10, 1 BM 11/12- advance diet as tolerated   -Recheck level tomorrow  11/21- New n/V-- ordered another KUB to look for ileus and put back on clear liquid diet- also ordered IV compazine, PO/PR phenergan and d/c'd Zofran since can cause constipation  11/22- wants ot wait til tomorrow to go back to  regular diet- but can be changed anytime pt wants to back to regular diet 17. Thrush?  11/9- will add Magic mouthwash- don't see plaques, but tongue dry and furrowed. Says things taste 'wierd".   11/10- said magic mouthwash helping Sx's.  18. Leukocytosis improved -   11/15- WBC down to 7.8  11/16- will recheck today to make sure not elevated  11/17- WBC stable at 8k  Recheck tomorrow  11/20- WBC down to 6.8  11/23- CRP 7.6- and ESR 21- doing great    Latest Ref Rng & Units 10/17/2022    9:20 AM 10/15/2022    4:16 AM 10/14/2022    2:25 PM  CBC  WBC 4.0 - 10.5 K/uL 10.6  7.0  8.3   Hemoglobin 12.0 - 15.0 g/dL 9.9  9.7  9.6   Hematocrit 36.0 - 46.0 % 29.4  28.1  27.1   Platelets 150 - 400 K/uL 315  285  270     19. Hypokalemia and low Mg  11/10- repleted up to 3.5- will put on Kcl 10 mEq daily for now and recheck Monday- since K+ still  3.5- will also check Mg Monday as well   11/14= Mg 1.6 and K+ 3.1- repleted again  yesterday by pharmacy for [place]- will recheck labs iN AM  11/15- K+ 4.2- will give IV Mg 2G for Mg 1.6- and recheck in AM  11/16- Mg 1.7- still low- will put on PO Mg 400 mg BID  Recheck K+ and MG tomorrow  11/20- K+ 3.8 and Mg 1.8- will con't regimen  11/22- K+ 3.4- repleted again IV- repleted yesterday IV since was 3.0-   11/23- KCL- will replete since K+ 3.2 today- will give 40 mEq x2 and recheck in AM- will also change daily KCL to 20 mEq- Mg level 1.7  11/24- K+ 3.6 20. Swallowing issues  11/14- appears to be thrush- will start Diflucan- due to renal issues, will need  to be 50 mg daily- x 7 days doses-   11/15- reports things taste better and was able ot swallow some this AM of breakfast-con't diflucan  11/19 reports improved, continue to monitor  11/23- resolved- Diflucan finished 21. Dizziness/lightheadedness/presyncope  11/15- will have pt get orthostatics checked by nursing and therapy- BP on high side, so not sure if Sx's due to BP being high or dropping.   11/16- added hydralazine 25 mg TID- is somewhat better today  11/20- BP a little on low side- likely due to ABLA- will hold BP meds if need be.  22. Infection of L hip?  11/16- still having drainage from L hip- spoke to Ortho- they are taking her to OR today at 5pm for wash out-   11/17- Gram stains negative so far- waiting for Cx's- surgeon gave Ancef x2 IV and will monitor Cultures  11/19 wound culture growing staph,Continue vancomycin, surgery advising ID consult, called ID to discuss -consult placed  11/20- con't IV Vanc per ID- Cx's still pending final. 11/21- changed to Daptomycin, per ID- will double check labs today- CK is pending- didn't get done yesterday- will check labs today as well  11/22- CK looking good- 23- con't Daptomycin- stop date is 11/07/22 per ID 23. Urinary retention  11/16- is new- will check U/A and Cx- and labs- CBC- with diff and CMP just to make sure no other issues  11/17- U/A (-) and doesn't have elevated WBC- mild left shift however- will start Flomax 0.4 mg q supper  11/8 continent PVRs  a little improved today< 351m, continue to monitor, continue flomax, IC if > 350 ml  11/23- placed foley since having such high volumes and will leave in til early next week- will try voiding trial Monday/Tuesday- will also increase Flomax to 0.8 q supper  11/24- will treat possible UTI- and possible pneumonia- WBC up slightly to 10.6 with L shift 24. ABLA anemia-  11/20- down to 6.4- will transfuse 2 units since so low.    11/21- labs today- pending  11/22- Hb 9.7 25.  SOB/questionable pneumonia/bibasilar opacities  11/21- pt c/o SOB- O2 sats 98% on 2L- has been on O2 since surgery last week- will check CXR and gave IV Lasix 40 mg x1 - and let pt know we need her to void. If CXR Negative, will need CTA of chest to look for PE.   11/22- down to 0.5 L of O2- and satting well- actually CO2 was too high on 2L- so reduced due to CO2 retention- SOB improved- of note, no leukocytosis  11/23- off O2 completely- and satting well  11/24- more SOB- still has Bibasilar opacities- hadn't treated earlier because lack of elevated WBC and afebrile- will treat now- also treat possible UTI per IM with Rocephin 1G q24 hours and wait for U Cx.  26. Hyponatremia  11/23- Na down to 126- will put on fluid restriction of 1500cc and recheck in AM- if doesn't improve, will consult IM vs renal in AM  11/24- Na 125- will replete with IVFs- probably got too dry with Lasix IV- will give bolus 500cc x1 and then 75cc hour since not drinking/eating well today   I spent a total of  56  minutes on total care today- >50% coordination of care- due to discussion with PA, IM- not formal consult but had long conversation with IM and nursing x2. consultant;   LOS: 19 days A FACE TO FACE EVALUATION WAS PERFORMED  Himani Corona 10/17/2022, 11:45 AM

## 2022-10-17 NOTE — Progress Notes (Signed)
Incentive spirometer education completed with pt, needs reinforcement. Pt unable to follow command.  Sheela Stack, LPN

## 2022-10-17 NOTE — Progress Notes (Signed)
Patient noted with some increased abdominal breathing this morning with activity in the bed. When asked about shortness of breath patient reports "a little". Oxygen 91-92% on RA. Placed on 0.5NC for comfort and sats up to 95%. Patient remains confused. Setzer PA notified. New order received. Continue to monitor.

## 2022-10-17 NOTE — Progress Notes (Signed)
Occupational Therapy Session Note  Patient Details  Name: Anna Weiss MRN: [id number] Date of Birth: 1925-10-13  Today's Date: 10/17/2022 OT Individual Time:See below for Missed visit time 45 min, 45 min     10/17/22 1136  OT Missed Time: 1115-1200  OT Amount of Missed Time 45 Minutes  OT Missed Time Reason Patient ill (comment) (SOB/UTI/confusion with medical instability)       10/17/22 1445  OT Missed Time 1445-1530  OT Amount of Missed Time 45 Minutes  OT Missed Time Reason Patient ill (comment) (med unstable md hold for today d/t SOB, confusuion)    [name] 10/17/2022, 11:38 AM

## 2022-10-17 NOTE — Progress Notes (Signed)
Physical Therapy Session Note  Patient Details  Name: Anna Weiss MRN: [id number] Date of Birth: 1925/10/29  Today's Date: 10/17/2022 PT Missed Time: 60 Minutes Missed Time Reason: Patient ill (Comment) (SOB, Nursing reports unable to particpate in PT)  Short Term Goals: Week 2:  PT Short Term Goal 1 (Week 2): STG=LTG due to ELOS  Skilled Therapeutic Interventions/Progress Updates:      Therapy Documentation Precautions:  Precautions Precautions: Fall, Other (comment) Precaution Comments: monitor O2, orthostatics Restrictions Weight Bearing Restrictions: Yes LLE Weight Bearing: Weight bearing as tolerated  Pt with increased SOB and nursing reports pt unable to participate in PT. Pt missed 60 minutes of skilled PT and will plan to follow up as able.   Therapy/Group: Individual Therapy  Anna Weiss Anna Weiss PT, DPT  10/17/2022, 7:40 AM

## 2022-10-17 NOTE — Progress Notes (Addendum)
Remained confused all night. Recheck of VS and remained stable. No fever. Will remove and replace Foley and obtain UA and culture. Complaining of SOB. PA/lateral CXR to assess for volume overload/pleural effusion/infiltrates. SaO2 95% on 2L [place]. Asked staff to obtain patient's weight.

## 2022-10-18 LAB — BASIC METABOLIC PANEL
Anion gap: 12 (ref 5–15)
BUN: 11 mg/dL (ref 8–23)
CO2: 26 mmol/L (ref 22–32)
Calcium: 9.1 mg/dL (ref 8.9–10.3)
Chloride: 88 mmol/L — ABNORMAL LOW (ref 98–111)
Creatinine, Ser: 0.64 mg/dL (ref 0.44–1.00)
GFR, Estimated: >60 mL/min (ref >60)
Glucose, Bld: 160 mg/dL — ABNORMAL HIGH (ref 70–99)
Potassium: 3.8 mmol/L (ref 3.5–5.1)
Sodium: 126 mmol/L — ABNORMAL LOW (ref 135–145)

## 2022-10-18 LAB — CBC WITH DIFFERENTIAL/PLATELET
Abs Immature Granulocytes: 0.09 10*3/uL — ABNORMAL HIGH (ref 0.00–0.07)
Basophils Absolute: 0 10*3/uL (ref 0.0–0.1)
Basophils Relative: 0 %
Eosinophils Absolute: 0 10*3/uL (ref 0.0–0.5)
Eosinophils Relative: 0 %
HCT: 27.9 % — ABNORMAL LOW (ref 36.0–46.0)
Hemoglobin: 9.4 g/dL — ABNORMAL LOW (ref 12.0–15.0)
Immature Granulocytes: 1 %
Lymphocytes Relative: 2 %
Lymphs Abs: 0.3 10*3/uL — ABNORMAL LOW (ref 0.7–4.0)
MCH: 34.8 pg — ABNORMAL HIGH (ref 26.0–34.0)
MCHC: 33.7 g/dL (ref 30.0–36.0)
MCV: 103.3 fL — ABNORMAL HIGH (ref 80.0–100.0)
Monocytes Absolute: 1.2 10*3/uL — ABNORMAL HIGH (ref 0.1–1.0)
Monocytes Relative: 9 %
Neutro Abs: 11.9 10*3/uL — ABNORMAL HIGH (ref 1.7–7.7)
Neutrophils Relative %: 88 %
Platelets: 298 10*3/uL (ref 150–400)
RBC: 2.7 MIL/uL — ABNORMAL LOW (ref 3.87–5.11)
RDW: 19.3 % — ABNORMAL HIGH (ref 11.5–15.5)
WBC: 13.6 10*3/uL — ABNORMAL HIGH (ref 4.0–10.5)
nRBC: 0 % (ref 0.0–0.2)

## 2022-10-18 LAB — URINE CULTURE: Culture: NO GROWTH

## 2022-10-18 MED ORDER — SODIUM CHLORIDE 0.9 % IV SOLN: INTRAVENOUS | Status: DC

## 2022-10-18 NOTE — Progress Notes (Signed)
Physical Therapy Session Note  Patient Details  Name: Anna Weiss MRN: [id number] Date of Birth: 20-Jul-1925  Today's Date: 10/18/2022 PT Individual Time:  Session 1: 0757-0812    Session 2: 1025-1040 PT Individual Time Calculation (min):  Session 1: 15 min        Session 2: 15 min  Short Term Goals: Week 2:  PT Short Term Goal 1 (Week 2): STG=LTG due to ELOS  Skilled Therapeutic Interventions/Progress Updates:  Session 1: Chart reviewed. Pt found in bed with slight confusion. Pt able to greet good morning and express feeling tired and hungry given recent missed meals. Pt following 25% of commands. NT also entered room to assist pt with food. Pt requesting to eat. Pt able to roll L and R with MaxA and strong VC for repositioning. Pt left semi-reclined in bed with NT present,  nurse call bell and all needs in reach.  Session 2: Chart reviewed and pt agreeable to therapy. Pt received reclined in bed with CNA present for foley management and no c/o pain. Also of note, pt was found to be counting and moving arms at random. Session focused on functional transfers to promote home mobility. Pt was able to follow 50% of commands throughout session. Pt initiated session with R roll and lying>sit with modA. Pt then sat EOB with CGA for static sit balance. At EOB, pt was able to completed 1x5 B shld flex and LAQ with MinA for balance, but required strong VC for transitioning between activities. DO entered room for brief pt follow up. Pt then continued another set of 1x5 B shld flex and LAQ with MinA for balance. Pt then c/o fatigue and laid in bed. Pt made continuous statements of feeling tired and required MaxA to return to bed position. PT and CNA returned pt to seated position in bed. PT then attempted to continue bed-level exercises with pt, but pt was no longer following command. At end of session, pt was left semi-reclined in bed with alarm engaged, nurse call bell and all needs in reach. RN updated on  status at end of session.   Therapy Documentation Precautions:  Precautions Precautions: Fall, Other (comment) Precaution Comments: monitor O2, orthostatics Restrictions Weight Bearing Restrictions: Yes LLE Weight Bearing: Weight bearing as tolerated General: PT Amount of Missed Time (min): 90 Minutes Total (45 min + 45 min) PT Missed Treatment Reason: Patient ill (Comment) (pt still confused with limited command following. Pt also requesting to eat with assistance of present NT in morning session. Pt fatigued early in second session with reduced command following)     Therapy/Group: Individual Therapy  Marquette Old, PT, DPT 10/18/2022, 8:23 AM

## 2022-10-18 NOTE — Progress Notes (Signed)
Occupational Therapy Session Note  Patient Details  Name: Anna Weiss MRN: [id number] Date of Birth: 1925-02-08  Today's Date: 10/18/2022 OT Missed Time: 75 Minutes Missed Time Reason: Patient ill (comment) (lethargic and unstable for therapy per nursing)   Per yellow dot on pt's door, therapist checked in with nursing regarding status for participation in therapy. Per nursing, pt is too lethargic to participate at this time with MD notified. Will attempt to make up missed mins as status changes.   Therapy/Group: Individual Therapy  Blase Mess, MS, OTR/L  10/18/2022, 2:35 PM

## 2022-10-18 NOTE — Progress Notes (Signed)
Pt difficult to arouse for po medications this shift. Notified on call provider Dr. Tressa Busman. Pt was awoke later and was able to administer po meds. Eliquis held per provider due to hematuria.

## 2022-10-18 NOTE — Progress Notes (Signed)
PROGRESS NOTE   Subjective/Complaints:  BMP mildly improved NA 126; WBC increased to 13.6 this AM; otherwise unremarkable. Remains on 1 L [place]. Very confused and combative overnight per staff. Ucx pending. Remains on daptomycin, IV ceftriaxone started overnight. With therapies, some improved participation this AM, ate 100% breakfast although remains extremely confused. On exam, patient sitting EOB with therapies, eyes closed and not verbalizing  but able to follow commands in all 4 extremities with mild stimulation.   On review, patient fatigued early in subsequent therapy sessions and was too lethargic for OT today. Nursing reported gross hematuria in foley, had been changed yesterday for UA and CX. Cx remains with no growth prelim. Otherwise, unchanged. Ordered nursing to hold PM eliquis 2.5 and ordered labs for AM. Notable HgB stable on last few labs.   Nighttime nursing calls stating patient is too lethargic for PO medications. Did eat 35% dinner per record.     ROS:  Limited by cognition Except per HPI   Objective:   DG Chest 2 View  Result Date: 10/17/2022 CLINICAL DATA:  Shortness of breath EXAM: CHEST - 2 VIEW COMPARISON:  10/14/2022 and prior studies FINDINGS: Cardiomediastinal silhouette is unchanged. A RIGHT PICC line is again identified with tip overlying the LOWER SVC. Bilateral interstitial opacities, bilateral LOWER lung opacities/atelectasis and bilateral pleural effusions again noted. LEFT perihilar opacity has slightly improved. There is no evidence of pneumothorax. IMPRESSION: Slightly improved LEFT perihilar opacity/atelectasis. Otherwise unchanged appearance of the chest with bilateral interstitial opacities, bilateral LOWER lung opacities/atelectasis and bilateral pleural effusions. Electronically Signed   By: Margarette Canada M.D.   On: 10/17/2022 08:27   Recent Labs    10/17/22 0920 10/18/22 0324  WBC 10.6* 13.6*   HGB 9.9* 9.4*  HCT 29.4* 27.9*  PLT 315 298     Recent Labs    10/16/22 2325 10/18/22 0324  NA 125* 126*  K 3.6 3.8  CL 84* 88*  CO2 27 26  GLUCOSE 110* 160*  BUN 9 11  CREATININE 0.61 0.64  CALCIUM 8.8* 9.1     Intake/Output Summary (Last 24 hours) at 10/18/2022 7289 Last data filed at 10/18/2022 0532 Gross per 24 hour  Intake 435 ml  Output 450 ml  Net -15 ml      Pressure Injury 10/16/22 Ischial tuberosity Right Stage 2 -  Partial thickness loss of dermis presenting as a shallow open injury with a red, pink wound bed without slough. (Active)  10/16/22 0900  Location: Ischial tuberosity  Location Orientation: Right  Staging: Stage 2 -  Partial thickness loss of dermis presenting as a shallow open injury with a red, pink wound bed without slough.  Wound Description (Comments):   Present on Admission: No     Pressure Injury 10/16/22 Right Stage 2 -  Partial thickness loss of dermis presenting as a shallow open injury with a red, pink wound bed without slough. (Active)  10/16/22 0900  Location:   Location Orientation: Right  Staging: Stage 2 -  Partial thickness loss of dermis presenting as a shallow open injury with a red, pink wound bed without slough.  Wound Description (Comments):   Present on Admission: No  Physical Exam: Vital Signs Blood pressure (!) 148/52, pulse 70, temperature 97.8 F (36.6 C), resp. rate 18, height _0  (1.702 m), weight 70 kg, SpO2 91 %.          General: awake, alert, getting albuterol nebs- kept trying to take nebs treatment off; NAD HENT: conjugate gaze; oropharynx dry- CV: regular rate; no JVD Pulmonary: good air movement upper lobes,+ crackles in L base GI: soft, NT, ND, (+)BS Musculoskeletal:     Cervical back: Normal range of motion.     Comments: Nontender on palpation of bilateral hips, thighs on exam Skin: L hip less swelling; dressing C/D/I    General: Skin is warm.     Findings: Bruising (arms) present.  No signs of ecchymosis or diffuse bruising.     Comments: Left hip incision CDI, dressing dry  Neurological: confused, nonverbal but able to follow 2/3 simple commands in all 4 extremities.    Cranial Nerves: No cranial nerve deficit.     Comments: Motor: 4-5/5 Ue's. Sitting upright at EOB with Mod A.   Assessment/Plan: 1. Functional deficits which require 3+ hours per day of interdisciplinary therapy in a comprehensive inpatient rehab setting. Physiatrist is providing close team supervision and 24 hour management of active medical problems listed below. Physiatrist and rehab team continue to assess barriers to discharge/monitor patient progress toward functional and medical goals  Care Tool:  Bathing    Body parts bathed by patient: Right arm, Left arm, Chest, Abdomen, Front perineal area, Right upper leg, Face, Left upper leg   Body parts bathed by helper: Left lower leg, Right lower leg, Buttocks     Bathing assist Assist Level: Maximal Assistance - Patient 24 - 49%     Upper Body Dressing/Undressing Upper body dressing   What is the patient wearing?: Pull over shirt    Upper body assist Assist Level: Minimal Assistance - Patient > 75%    Lower Body Dressing/Undressing Lower body dressing      What is the patient wearing?: Incontinence brief, Pants     Lower body assist Assist for lower body dressing: Maximal Assistance - Patient 25 - 49%     Toileting Toileting    Toileting assist Assist for toileting: Maximal Assistance - Patient 25 - 49%     Transfers Chair/bed transfer  Transfers assist  Chair/bed transfer activity did not occur: Safety/medical concerns (dizziness, pain, decreased balance)  Chair/bed transfer assist level: Minimal Assistance - Patient > 75%     Locomotion Ambulation   Ambulation assist   Ambulation activity did not occur: Safety/medical concerns (dizziness, pain, decreased balance)  Assist level: Minimal Assistance - Patient >  75% Assistive device: Walker-rolling Max distance: 38   Walk 10 feet activity   Assist  Walk 10 feet activity did not occur: Safety/medical concerns (dizziness, pain, decreased balance)  Assist level: Minimal Assistance - Patient > 75% Assistive device: Walker-rolling   Walk 50 feet activity   Assist Walk 50 feet with 2 turns activity did not occur: Safety/medical concerns         Walk 150 feet activity   Assist Walk 150 feet activity did not occur: Safety/medical concerns         Walk 10 feet on uneven surface  activity   Assist Walk 10 feet on uneven surfaces activity did not occur: Safety/medical concerns (dizziness, pain, decreased balance)         Wheelchair     Assist Is the patient using a wheelchair?: Yes Type  of Wheelchair: Educational psychologist activity did not occur: Safety/medical concerns (dizziness, pain, decreased balance)         Wheelchair 50 feet with 2 turns activity    Assist    Wheelchair 50 feet with 2 turns activity did not occur: Safety/medical concerns (dizziness, pain, decreased balance)       Wheelchair 150 feet activity     Assist  Wheelchair 150 feet activity did not occur: Safety/medical concerns (dizziness, pain, decreased balance)       Blood pressure (!) 148/52, pulse 70, temperature 97.8 F (36.6 C), resp. rate 18, height _0  (1.702 m), weight 70 kg, SpO2 91 %.  Medical Problem List and Plan: 1. Functional deficits secondary to left intertrochanteric femur fx after fall. Pt s/p IMN 09/25/22             -LLE WBAT             -patient may shower             -ELOS/Goals: 15-20 days  Con't CIR- PT and OT- overall, but hold today due to medical issues 2.  Antithrombotics: -DVT/anticoagulation:  xarelto 46m daily for 14 days 11/21- restarted Eliquis 2.5 mg BID for DVT prophylaxis- Dced d/t hematuria; SCDs             -antiplatelet therapy:  resume plavix 774mdaily 3. Pain Management: tylenol and  hydrocodone prn for pain             -robaxin prn for muscle spasms  11/8- pain limiting therapy- doesn't want opiates- mainly using tylenol-   11/9- d/w pt- she's willing to try tramadol 50 mg q6 hours prn- she's scared of Coding if takes Norco- I explained that Tramadol is mild and shouldn't cause any cognitive issues.   11/10- pain much better- not resolved, but better with tramadol- con't tramadol and tylenol prn  11/14- pain still doing better with tramadol  11/17- is worse today since had surgery yesterday-doesn't want any other meds  11/18 reports pain controlled, continue tramadol PRN  - 11/25 Dced sedating medications, no current pain.   4. Mood/Behavior/Sleep: team to provide ego support             -antipsychotic agents: n/a 5. Neuropsych/cognition: This patient is capable of making decisions on her own behalf. 6. Skin/Wound Care: dry dressings to left ear and hip  11/9- having some drainage- from L hip- is improved this Am- will monitor closely.  7. Fluids/Electrolytes/Nutrition: encourage appropriate PO             -pt's intake has inconsistent so far  - Improved 100-35% 11/25  8. Nausea:              -might be positional or med related.              -also having associated gas             -pt told me she's been moving bowels, however I don't see a bm recorded             -will try sorbitol today, dulcolax suppository prn             -protonix 11/8- has ileus- per #15 below  11/9- still having nausea- will change diet to clear liquid per pt request- and monitor  11/10- on clear liquid diet- pt wants to wait 1 more day before changing to regular diet- if not better tomorrow, will need to call GI  11/14-  nausea resolved- back on regular diet  11/16- nauseated again  111/7- Put on IVFs for another 24 hours at 75cc/hour since had surgery at 10-11pm at night and didn't get back from PACU until 1am- sh'e sleepy and not taking in much  11/18 LBM today, improved continue to  monitor 11/20- LBM last night  11.21- checking KUB and ordered clear liquid diet- also put on IVFs, but holding until CXR back- since not drinking a lot  11/22- will stop IVFs- is drinking enough per labs- pt want sot wait to go back to regular until tomorrow  11/23- back to regular diet- with 1500cc fluid restriction  11/24- placed IVFs x 2 4 hours and recheck in AM  11/25 -NA, BUN, Cr stable on 75 cc/hr IVF; Dced HCTZ as would complicate further studies; recheck BMP in AM  9. Left lower lobe consolidation             -pneumonia ruled out. Likely atelectasis             -IS, OOB             -wean oxygen to off  11/7- will write order to wean O2.  10. Sinus bradycardia:             -on propranolol and verapamil at home             -continue to hold as HR in 50's  11/14- since still in 50s, will not restart  11. Essential HTN             -continue hctz  11/14- BP usually controlled but up today- will monitor for trend  11/15- somewhat elevated, but not sure if dropping- see #21           11/16- started hydralazine  11/17- just got hydralazine- is slightly better- might need it increased over weekend  -11/19 good control overall continue to monitor  11/20- somewhat variable- due to ABLA likely  11/22- doing better  11/25 - DC HCTZ d/t IVF, hyponatremia as above - unable to give PO antihypertensives QHS Vitals:   10/17/22 2049 10/18/22 0401  BP: (!) 174/63 (!) 148/52  Pulse: 69 70  Resp: 20 18  Temp: 98.7 F (37.1 C) 97.8 F (36.6 C)  SpO2: 94% 91%    12. Right middle lobe nodule 2.3 cm- should be considered primary lung CA until ruled out- also has nodule as well 49m on R middle lung- could be mets per CT scan.              -f/u as outpt  11/7- d/w daughter- she said no f/u at this time- due to pt's age.   11/8- daughter doesn't want to let pt know about tumors.    13. Mild leukocytosis- 11.2   11/6- afebrile- other than difficulty swallowing- says it's dry?; denies UTI Sx's-  if gets worse clinically, will recheck in AM- right now scheduled for Wed AM.   11/8- WBC down to 9.4-  14. Constipation  11/6- LBM on day of fall per pt and chart- will give Sorbitol after therapy and follow with SSE if needed.   11/7- had multiple BM's- didn't need SSE- per notes- feels cleaned out   11/19 LBM today, improved 15. ABLA  11/6- Down to 7.6- will recheck Wednesday to make sure not dropping further- and transfuse if required.   11/8- Hb 7.3- on edge of needing transfusion.   11/9- Hb up to 8.0-   11/10-  Hb 7.7- will con't to monitor closely.   11/15- Hb 8.4  11/20- Hb 6.4- will transfuse as below  11/25 - HgB stable ~9, however with ongoing gross hematuria; holding PM eliqius, continue plavix  16. Post op ileus resolved BM x 3 11/10, 1 BM 11/12- advance diet as tolerated   -Recheck level tomorrow  11/21- New n/V-- ordered another KUB to look for ileus and put back on clear liquid diet- also ordered IV compazine, PO/PR phenergan and d/c'd Zofran since can cause constipation  11/22- wants ot wait til tomorrow to go back to regular diet- but can be changed anytime pt wants to back to regular diet  17. Thrush?  11/9- will add Magic mouthwash- don't see plaques, but tongue dry and furrowed. Says things taste 'wierd".   11/10- said magic mouthwash helping Sx's.  18. Leukocytosis improved   11/15- WBC down to 7.8  11/16- will recheck today to make sure not elevated  11/17- WBC stable at 8k  Recheck tomorrow  11/20- WBC down to 6.8  11/23- CRP 7.6- and ESR 21- doing great  11/25 - inc 13 from 10; allow 24 hours on new abx regimen; recheck in AM. Ucx NGTD, likely etiologies PNA vs. Ongoing L hip infection - both should be covered on daptomycin and rocephin     Latest Ref Rng & Units 10/18/2022    3:24 AM 10/17/2022    9:20 AM 10/15/2022    4:16 AM  CBC  WBC 4.0 - 10.5 K/uL 13.6  10.6  7.0   Hemoglobin 12.0 - 15.0 g/dL 9.4  9.9  9.7   Hematocrit 36.0 - 46.0 % 27.9  29.4   28.1   Platelets 150 - 400 K/uL 298  315  285     19. Hypokalemia and low Mg  11/10- repleted up to 3.5- will put on Kcl 10 mEq daily for now and recheck Monday- since K+ still  3.5- will also check Mg Monday as well   11/14= Mg 1.6 and K+ 3.1- repleted again  yesterday by pharmacy for [place]- will recheck labs iN AM  11/15- K+ 4.2- will give IV Mg 2G for Mg 1.6- and recheck in AM  11/16- Mg 1.7- still low- will put on PO Mg 400 mg BID  Recheck K+ and MG tomorrow  11/20- K+ 3.8 and Mg 1.8- will con't regimen  11/22- K+ 3.4- repleted again IV- repleted yesterday IV since was 3.0-   11/23- KCL- will replete since K+ 3.2 today- will give 40 mEq x2 and recheck in AM- will also change daily KCL to 20 mEq- Mg level 1.7  11/24- K+ 3.6  11/26 - K 3.8; monitor  20. Swallowing issues  11/14- appears to be thrush- will start Diflucan- due to renal issues, will need to be 50 mg daily- x 7 days doses-   11/15- reports things taste better and was able ot swallow some this AM of breakfast-con't diflucan  11/19 reports improved, continue to monitor  11/23- resolved- Diflucan finished  21. Dizziness/lightheadedness/presyncope  11/15- will have pt get orthostatics checked by nursing and therapy- BP on high side, so not sure if Sx's due to BP being high or dropping.   11/16- added hydralazine 25 mg TID- is somewhat better today  11/20- BP a little on low side- likely due to ABLA- will hold BP meds if need be.  22. Infection of L hip?  11/16- still having drainage from L hip- spoke to Ortho- they are taking  her to OR today at 5pm for wash out-   11/17- Gram stains negative so far- waiting for Cx's- surgeon gave Ancef x2 IV and will monitor Cultures  11/19 wound culture growing staph,Continue vancomycin, surgery advising ID consult, called ID to discuss -consult placed  11/20- con't IV Vanc per ID- Cx's still pending final. 11/21- changed to Daptomycin, per ID- will double check labs today- CK is pending-  didn't get done yesterday- will check labs today as well  11/22- CK looking good- 23- con't Daptomycin- stop date is 11/07/22 per ID  23. Urinary retention  11/16- is new- will check U/A and Cx- and labs- CBC- with diff and CMP just to make sure no other issues  11/17- U/A (-) and doesn't have elevated WBC- mild left shift however- will start Flomax 0.4 mg q supper  11/8 continent PVRs a little improved today< 356m, continue to monitor, continue flomax, IC if > 350 ml  11/23- placed foley since having such high volumes and will leave in til early next week- will try voiding trial Monday/Tuesday- will also increase Flomax to 0.8 q supper  11/24- will treat possible UTI- and possible pneumonia- WBC up slightly to 10.6 with L shift  11/25: See above; ongoing hematuria w/o growth on Ucx; ? UTI  24. ABLA anemia-  11/20- down to 6.4- will transfuse 2 units since so low.    11/21- labs today- pending  11/22- Hb 9.7  11/25: Held QHS eliquis 2.5 for gross hematuria; cbc in AM  25. SOB/questionable pneumonia/bibasilar opacities  11/21- pt c/o SOB- O2 sats 98% on 2L- has been on O2 since surgery last week- will check CXR and gave IV Lasix 40 mg x1 - and let pt know we need her to void. If CXR Negative, will need CTA of chest to look for PE.   11/22- down to 0.5 L of O2- and satting well- actually CO2 was too high on 2L- so reduced due to CO2 retention- SOB improved- of note, no leukocytosis  11/23- off O2 completely- and satting well  11/24- more SOB- still has Bibasilar opacities- hadn't treated earlier because lack of elevated WBC and afebrile- will treat now- also treat possible UTI per IM with Rocephin 1G q24 hours and wait for U Cx.   - 11/25: See above; if WBC continues uptrend in AM despite broad IV abx and IVF, will get Bcx 2 and re-engage IM, ID for infectious workup, hyponatremia, and hematuria  26. 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

## 2022-10-18 NOTE — Progress Notes (Signed)
Pt. Was confused throughout the whole shift throwing arms in the and not following commands. Stalock to foley removed by pt. Foley intact and patent with tea colored urine. Statlock replaced and foley secured.

## 2022-10-19 ENCOUNTER — Inpatient Hospital Stay (HOSPITAL_COMMUNITY): Payer: Medicare Other

## 2022-10-19 LAB — CBC
HCT: 27.2 % — ABNORMAL LOW (ref 36.0–46.0)
Hemoglobin: 9.4 g/dL — ABNORMAL LOW (ref 12.0–15.0)
MCH: 35.6 pg — ABNORMAL HIGH (ref 26.0–34.0)
MCHC: 34.6 g/dL (ref 30.0–36.0)
MCV: 103 fL — ABNORMAL HIGH (ref 80.0–100.0)
Platelets: 296 10*3/uL (ref 150–400)
RBC: 2.64 MIL/uL — ABNORMAL LOW (ref 3.87–5.11)
RDW: 18.8 % — ABNORMAL HIGH (ref 11.5–15.5)
WBC: 11.6 10*3/uL — ABNORMAL HIGH (ref 4.0–10.5)
nRBC: 0 % (ref 0.0–0.2)

## 2022-10-19 LAB — BASIC METABOLIC PANEL
Anion gap: 9 (ref 5–15)
BUN: 9 mg/dL (ref 8–23)
CO2: 29 mmol/L (ref 22–32)
Calcium: 8.9 mg/dL (ref 8.9–10.3)
Chloride: 91 mmol/L — ABNORMAL LOW (ref 98–111)
Creatinine, Ser: 0.51 mg/dL (ref 0.44–1.00)
GFR, Estimated: >60 mL/min (ref >60)
Glucose, Bld: 126 mg/dL — ABNORMAL HIGH (ref 70–99)
Potassium: 3.4 mmol/L — ABNORMAL LOW (ref 3.5–5.1)
Sodium: 129 mmol/L — ABNORMAL LOW (ref 135–145)

## 2022-10-19 LAB — MAGNESIUM: Magnesium: 1.7 mg/dL (ref 1.7–2.4)

## 2022-10-19 NOTE — Progress Notes (Signed)
Physical Therapy Session Note  Patient Details  Name: KYLANI WIRES MRN: [id number] Date of Birth: [birth year]  Today's Date: 10/19/2022 PT Individual Time:  -      Short Term Goals: Week 2:  PT Short Term Goal 1 (Week 2): STG=LTG due to ELOS           Therapy Documentation Precautions:  Precautions Precautions: Fall, Other (comment) Precaution Comments: monitor O2, orthostatics Restrictions Weight Bearing Restrictions: Yes LLE Weight Bearing: Weight bearing as tolerated General: PT Amount of Missed Time (min): 60 Minutes PT Missed Treatment Reason: MD hold (Comment) (pt with increased lethargy and confusion today)       Therapy/Group: Individual Therapy  Keland Peyton 10/19/2022, 2:15 PM

## 2022-10-19 NOTE — Progress Notes (Signed)
Pt unable to take PO medications due to lethargy, notified Dr. Tressa Busman.

## 2022-10-19 NOTE — Progress Notes (Addendum)
Physical Therapy Session Note  Patient Details  Name: AKEMI OVERHOLSER MRN: [id number] Date of Birth: [birth year]  Today's Date: 10/19/2022 PT Missed Time: 56 Minutes Missed Time Reason: Patient ill (Comment)  Short Term Goals: Week 2:  PT Short Term Goal 1 (Week 2): STG=LTG due to ELOS  Skilled Therapeutic Interventions/Progress Updates:      Therapy Documentation Precautions:  Precautions Precautions: Fall, Other (comment) Precaution Comments: monitor O2, orthostatics Restrictions Weight Bearing Restrictions: Yes LLE Weight Bearing: Weight bearing as tolerated   Per nursing, pt lethargic and unable to participate in PT today. Pt unable to keep eyes open and with increased confusion. PT notified DO and pt placed on medical hold for therapy.  PT will follow up as able to make up missed minutes.    Therapy/Group: Individual Therapy  [name] [name] PT, DPT  10/19/2022, 7:47 AM

## 2022-10-19 NOTE — Progress Notes (Signed)
Paged portable for SCDs.

## 2022-10-19 NOTE — Progress Notes (Signed)
Pt difficult to arouse this AM. Unable to give PO medication this AM. Passed on to next shift next.

## 2022-10-19 NOTE — Progress Notes (Signed)
Orthopedic Surgery Post-operative Note   Assessment: Patient is a 88 y.o. female with left intertrochanteric femur fracture status post IMN (09/25/2022), developed persistent drainage and now s/p I&D (10/09/2022)   Plan: -Operative plans: complete -Infectious disease consulted, appreciate recs -Keep dressing dry and in place at all times -Diet: regular -DVT ppx: xarelto -Antibiotics: daptomycin -Weight bearing status: as tolerated -PT/OT evaluate and treat -Pain control -Dispo: per primary  ___________________________________________________________________________  Subjective: No acute events overnight. Remains on the floor.    Physical Exam:  General: no acute distress, laying in bed Respiratory: unlabored breathing, symmetric chest rise  Neurologic: sleeping deeply, does not awake to voice   MSK:   -Left lower extremity  Distal dressing with small amount of bloody drainage and intact, proximal dressing c/d/i Foot warm and well perfused   Patient name: Anna Weiss Patient MRN: [id number] Date: 10/19/22

## 2022-10-19 NOTE — Progress Notes (Signed)
Occupational Therapy Session Note  Patient Details  Name: Anna Weiss MRN: [id number] Date of Birth: 30-Sep-1925  Today's Date: 10/19/2022  Short Term Goals: Week 3:  OT Short Term Goal 1 (Week 3): Pt will transfer bed to DABSC with mod A OT Short Term Goal 2 (Week 3): Pt will bathe sink side with min A using AE OT Short Term Goal 3 (Week 3): Pt will stand for 1 minute for simple ADL's with support with min A  Skilled Therapeutic Interventions/Progress Updates:    Pt MD hold currently secondary to increased lethargy and confusion. Missed 60 minutes of OT time. Will make up missed time as able.   Therapy Documentation Precautions:  Precautions Precautions: Fall, Other (comment) Precaution Comments: monitor O2, orthostatics Restrictions Weight Bearing Restrictions: Yes LLE Weight Bearing: Weight bearing as tolerated General: General OT Amount of Missed Time: 60 Minutes PT Missed Treatment Reason: MD hold (Comment) (pt with increased lethargy and confusion today)   Therapy/Group: Individual Therapy  Marvetta Gibbons 10/19/2022, 3:33 PM

## 2022-10-20 LAB — CBC WITH DIFFERENTIAL/PLATELET
Abs Immature Granulocytes: 0.07 10*3/uL (ref 0.00–0.07)
Basophils Absolute: 0 10*3/uL (ref 0.0–0.1)
Basophils Relative: 0 %
Eosinophils Absolute: 0 10*3/uL (ref 0.0–0.5)
Eosinophils Relative: 0 %
HCT: 28.8 % — ABNORMAL LOW (ref 36.0–46.0)
Hemoglobin: 9.5 g/dL — ABNORMAL LOW (ref 12.0–15.0)
Immature Granulocytes: 1 %
Lymphocytes Relative: 6 %
Lymphs Abs: 0.6 10*3/uL — ABNORMAL LOW (ref 0.7–4.0)
MCH: 34.5 pg — ABNORMAL HIGH (ref 26.0–34.0)
MCHC: 33 g/dL (ref 30.0–36.0)
MCV: 104.7 fL — ABNORMAL HIGH (ref 80.0–100.0)
Monocytes Absolute: 1.2 10*3/uL — ABNORMAL HIGH (ref 0.1–1.0)
Monocytes Relative: 12 %
Neutro Abs: 7.9 10*3/uL — ABNORMAL HIGH (ref 1.7–7.7)
Neutrophils Relative %: 81 %
Platelets: 301 10*3/uL (ref 150–400)
RBC: 2.75 MIL/uL — ABNORMAL LOW (ref 3.87–5.11)
RDW: 19 % — ABNORMAL HIGH (ref 11.5–15.5)
WBC: 9.7 10*3/uL (ref 4.0–10.5)
nRBC: 0 % (ref 0.0–0.2)

## 2022-10-20 LAB — IRON AND TIBC
Iron: 24 ug/dL — ABNORMAL LOW (ref 28–170)
Saturation Ratios: 14 % (ref 10.4–31.8)
TIBC: 171 ug/dL — ABNORMAL LOW (ref 250–450)
UIBC: 147 ug/dL

## 2022-10-20 LAB — BASIC METABOLIC PANEL
Anion gap: 7 (ref 5–15)
BUN: 10 mg/dL (ref 8–23)
CO2: 29 mmol/L (ref 22–32)
Calcium: 8.7 mg/dL — ABNORMAL LOW (ref 8.9–10.3)
Chloride: 92 mmol/L — ABNORMAL LOW (ref 98–111)
Creatinine, Ser: 0.54 mg/dL (ref 0.44–1.00)
GFR, Estimated: >60 mL/min (ref >60)
Glucose, Bld: 123 mg/dL — ABNORMAL HIGH (ref 70–99)
Potassium: 3.5 mmol/L (ref 3.5–5.1)
Sodium: 128 mmol/L — ABNORMAL LOW (ref 135–145)

## 2022-10-20 LAB — MAGNESIUM: Magnesium: 1.9 mg/dL (ref 1.7–2.4)

## 2022-10-20 LAB — FOLATE: Folate: 14.7 ng/mL (ref >5.9)

## 2022-10-20 LAB — CK: Total CK: 16 U/L — ABNORMAL LOW (ref 38–234)

## 2022-10-20 LAB — VITAMIN B12: Vitamin B-12: 1155 pg/mL — ABNORMAL HIGH (ref 180–914)

## 2022-10-20 LAB — FERRITIN: Ferritin: 770 ng/mL — ABNORMAL HIGH (ref 11–307)

## 2022-10-20 MED ORDER — LINEZOLID 600 MG/300ML IV SOLN
600.0000 mg | Freq: Two times a day (BID) | INTRAVENOUS | Status: DC
  Administered 2022-10-20 – 2022-10-21 (×2): 600 mg via INTRAVENOUS
  Filled 2022-10-20 (×2): qty 300

## 2022-10-20 MED ORDER — FUROSEMIDE 10 MG/ML IJ SOLN
60.0000 mg | Freq: Once | INTRAMUSCULAR | Status: AC
  Administered 2022-10-20: 60 mg via INTRAVENOUS
  Filled 2022-10-20: qty 6

## 2022-10-20 NOTE — Progress Notes (Signed)
Physical Therapy Session Note  Patient Details  Name: AZYRIAH NEVINS MRN: [id number] Date of Birth: [birth year]  Today's Date: 10/20/2022 PT Missed Time: 60 Minutes Missed Time Reason: Patient fatigue;Patient ill (Comment) (lethargic and SOB)  Short Term Goals: Week 2:  PT Short Term Goal 1 (Week 2): STG=LTG due to ELOS  Skilled Therapeutic Interventions/Progress Updates:      Therapy Documentation Precautions:  Precautions Precautions: Fall, Other (comment) Precaution Comments: monitor O2, orthostatics Restrictions Weight Bearing Restrictions: Yes LLE Weight Bearing: Weight bearing as tolerated   Pt unable to participate in PT session due to lethargy and SOB. Pt missed 60 minutes of skilled PT, plan to make up missed minutes as able.     Therapy/Group: Individual Therapy  [name] [name] PT, DPT  10/20/2022, 3:52 PM

## 2022-10-20 NOTE — Progress Notes (Addendum)
Occupational Therapy Session Note  Patient Details  Name: Anna Weiss MRN: [id number] Date of Birth: July 04, 1925  Today's Date: 10/20/2022 OT Individual Time: 0800-0830 1st Session, 2nd Session 1320-1340 OT Individual Time Calculation (min): 30 min and 15 minutes Missed OT 1st Session; 20 min with 40 min missed minutes   Short Term Goals: Week 3:  OT Short Term Goal 1 (Week 3): Pt will transfer bed to DABSC with mod A OT Short Term Goal 2 (Week 3): Pt will bathe sink side with min A using AE OT Short Term Goal 3 (Week 3): Pt will stand for 1 minute for simple ADL's with support with min A  Skilled Therapeutic Interventions/Progress Updates:   1st Session:  Pt in bed upon OT arrival after discussing trial of OT with nurse Anna Weiss. No formal medical hold documented therefore OT did initiate light therapy this session. Denies pain, in and out of drowsiness and awake states with stimuli, baseline VS BP 165/56 HR 70, RR 20, basic reality orientation, simple bathing, hair brushing and gown change, light holiday music played for arousal. Simple ankle pumps, shoulder shrugs 10 reps with max cues and rest breaks, 2 sips of OJ with max A hand over hand. Overall assessment: pt has been on medical hold for last several days. OT did work with pt for 30 min this am for reassessment of BADL's and was Dep except for face washing, cup to mouth for 2 sips and simple lotion application to face. Otherwise pt lethargic, drowsy and only participated when directly stimulated. All therapy was bed level and missed 15 minutes out of 45. Pt left bed level with bed exit engaged, call button and needs in reach.   2nd Session:  Spoke with nursing and no medical changes or recs made therefore nurse felt OT could work with pt for light bed level functional activity. Dtr bedside and able to recount the last few days events re: missed therapy and pt's level of arousal and OT updated dtr on tasks completed in earlier am session.  Lunch meal sitting untouched next to bed on tray table. Pt was asleep but easily aroused and maintained arousal only when spoken to or stimulated. Pt completed face washing again with L UE with set up and min A this session. RR rate remained 18 throughout session and O2 sats monitored with 1/2 liter via [place] from 92%-95%. Pt with increased edema noted in R>L hand and foam cube squeezes performed on command throughout rest of short session. Pt sat upright in the bed and OT hand pt trial small spoonfuls of soup and ginger ale and able to complete 6 bites and 2 sips before falling back to sleep. Maintained HOB fully upright. Returned Ue's on pillows for edema management and dtr aware to encourage foam cube squeezes when possible. Left pt due to lethargy and fatigue prir to session completed. Bed exit engaged, needs and dtr bedside.    Therapy Documentation Precautions:  Precautions Precautions: Fall, Other (comment). Contact, DNR  Precaution Comments: monitor O2, orthostatics Restrictions Weight Bearing Restrictions: Yes LLE Weight Bearing: Weight bearing as tolerated    Therapy/Group: Individual Therapy  Anna Weiss 10/20/2022, 8:00 AM

## 2022-10-20 NOTE — Progress Notes (Signed)
PROGRESS NOTE   Subjective/Complaints:  Per nursing overnight, pt did much better last night- took meds PO and slept- much less confused.   Pt telling me she feels SOB- but sats 94-95% on 1 L Of O2 by [place].   Day nurse concerned reporting pt is "gasping", - I asked her , if concerned, to call rapid response- I heard this after seeing pt this AM.   Asked PA to lay eyes on pt- we decided to call Nephrology about low Na and ID already called about possible worsening pneumonia.   LBM overnight.  Per nursing, also pt more responsive and interactive.    ROS: Limited by cognition, but more able to answer questions.  Except per HPI   Objective:   DG Chest 1 View  Result Date: 10/19/2022 CLINICAL DATA:  Cough EXAM: CHEST  1 VIEW COMPARISON:  10/17/2022 FINDINGS: The patient is rotated to the LEFT. Cardiomediastinal silhouette is not significantly changed. A RIGHT PICC line is present with tip difficult to visualize but appears to overlie the SVC. Bilateral airspace opacities are stable to slightly increased, more focal in the RIGHT UPPER lobe. Bibasilar opacities/atelectasis again noted with small bilateral pleural effusions. LEFT LOWER lung consolidation/atelectasis also appears slightly increased. There is no evidence of pneumothorax. IMPRESSION: 1. Slightly increased bilateral airspace opacities, more focal in the RIGHT UPPER lobe. 2. Slightly increased LEFT LOWER lung consolidation/atelectasis. 3. Unchanged bibasilar opacities/atelectasis with small bilateral pleural effusions. Electronically Signed   By: Margarette Canada M.D.   On: 10/19/2022 15:24   Recent Labs    10/19/22 0419 10/20/22 0430  WBC 11.6* 9.7  HGB 9.4* 9.5*  HCT 27.2* 28.8*  PLT 296 301    Recent Labs    10/19/22 0419 10/20/22 0430  NA 129* 128*  K 3.4* 3.5  CL 91* 92*  CO2 29 29  GLUCOSE 126* 123*  BUN 9 10  CREATININE 0.51 0.54  CALCIUM 8.9 8.7*     Intake/Output Summary (Last 24 hours) at 10/20/2022 1329 Last data filed at 10/20/2022 0524 Gross per 24 hour  Intake 120 ml  Output 1075 ml  Net -955 ml     Pressure Injury 10/16/22 Ischial tuberosity Right Stage 2 -  Partial thickness loss of dermis presenting as a shallow open injury with a red, pink wound bed without slough. (Active)  10/16/22 0900  Location: Ischial tuberosity  Location Orientation: Right  Staging: Stage 2 -  Partial thickness loss of dermis presenting as a shallow open injury with a red, pink wound bed without slough.  Wound Description (Comments):   Present on Admission: No     Pressure Injury 10/16/22 Right Stage 2 -  Partial thickness loss of dermis presenting as a shallow open injury with a red, pink wound bed without slough. (Active)  10/16/22 0900  Location:   Location Orientation: Right  Staging: Stage 2 -  Partial thickness loss of dermis presenting as a shallow open injury with a red, pink wound bed without slough.  Wound Description (Comments):   Present on Admission: No    Physical Exam: Vital Signs Blood pressure (!) 160/59, pulse 70, temperature 97.7 F (36.5 C), temperature source Axillary,  resp. rate 20, height _0  (1.702 m), weight 70.2 kg, SpO2 95 %.           General: awake, alert, but very sleepy; supine in bed; on 1L O2, more color in her face; NAD HENT: conjugate gaze; oropharynx dry- helped her drink CV: regular rate; no JVD Pulmonary:decreased on R lung in general and a few rhonchi heard on L side GI: soft, NT, ND, (+)BS Psychiatric: somewhat more interactive Extremities: 1+ LE edema B/L  Neurological: more alert, more responsive and more interactive, but still NOT baseline Musculoskeletal:     Cervical back: Normal range of motion.     Comments: Nontender on palpation of bilateral hips, thighs on exam Skin: L hip less swelling; dressing C/D/I    General: Skin is warm.     Findings: Bruising (arms) present. No  signs of ecchymosis or diffuse bruising.     Comments: Left hip incision CDI, dressing dry  Neurological: confused, nonverbal but able to follow 2/3 simple commands in all 4 extremities.    Cranial Nerves: No cranial nerve deficit.     Comments: Motor: 4-5/5 Ue's. Sitting upright at EOB with Mod A.   Assessment/Plan: 1. Functional deficits which require 3+ hours per day of interdisciplinary therapy in a comprehensive inpatient rehab setting. Physiatrist is providing close team supervision and 24 hour management of active medical problems listed below. Physiatrist and rehab team continue to assess barriers to discharge/monitor patient progress toward functional and medical goals  Care Tool:  Bathing    Body parts bathed by patient: Face, Abdomen, Chest   Body parts bathed by helper: Right arm, Left arm, Front perineal area, Buttocks, Right upper leg, Left upper leg, Right lower leg, Left lower leg     Bathing assist Assist Level: Maximal Assistance - Patient 24 - 49%     Upper Body Dressing/Undressing Upper body dressing   What is the patient wearing?: Hospital gown only    Upper body assist Assist Level: Maximal Assistance - Patient 25 - 49%    Lower Body Dressing/Undressing Lower body dressing      What is the patient wearing?: Hospital gown only     Lower body assist Assist for lower body dressing: Maximal Assistance - Patient 25 - 49%     Toileting Toileting    Toileting assist Assist for toileting: Dependent - Patient 0%     Transfers Chair/bed transfer  Transfers assist  Chair/bed transfer activity did not occur: Safety/medical concerns (dizziness, pain, decreased balance)  Chair/bed transfer assist level: Minimal Assistance - Patient > 75%     Locomotion Ambulation   Ambulation assist   Ambulation activity did not occur: Safety/medical concerns (dizziness, pain, decreased balance)  Assist level: Minimal Assistance - Patient > 75% Assistive device:  Walker-rolling Max distance: 38   Walk 10 feet activity   Assist  Walk 10 feet activity did not occur: Safety/medical concerns (dizziness, pain, decreased balance)  Assist level: Minimal Assistance - Patient > 75% Assistive device: Walker-rolling   Walk 50 feet activity   Assist Walk 50 feet with 2 turns activity did not occur: Safety/medical concerns         Walk 150 feet activity   Assist Walk 150 feet activity did not occur: Safety/medical concerns         Walk 10 feet on uneven surface  activity   Assist Walk 10 feet on uneven surfaces activity did not occur: Safety/medical concerns (dizziness, pain, decreased balance)  Wheelchair     Assist Is the patient using a wheelchair?: Yes Type of Wheelchair: Manual Wheelchair activity did not occur: Safety/medical concerns (dizziness, pain, decreased balance)         Wheelchair 50 feet with 2 turns activity    Assist    Wheelchair 50 feet with 2 turns activity did not occur: Safety/medical concerns (dizziness, pain, decreased balance)       Wheelchair 150 feet activity     Assist  Wheelchair 150 feet activity did not occur: Safety/medical concerns (dizziness, pain, decreased balance)       Blood pressure (!) 160/59, pulse 70, temperature 97.7 F (36.5 C), temperature source Axillary, resp. rate 20, height _0  (1.702 m), weight 70.2 kg, SpO2 95 %.  Medical Problem List and Plan: 1. Functional deficits secondary to left intertrochanteric femur fx after fall. Pt s/p IMN 09/25/22             -LLE WBAT             -patient may shower             -ELOS/Goals: 15-20 days  Con't CIR- PT and OT - but on hold- for now 2.  Antithrombotics: -DVT/anticoagulation:  xarelto 57m daily for 14 days 11/21- restarted Eliquis 2.5 mg BID for DVT prophylaxis- Dced d/t hematuria; SCDs             -antiplatelet therapy:  resume plavix 796mdaily 3. Pain Management: tylenol and hydrocodone prn for  pain             -robaxin prn for muscle spasms  11/8- pain limiting therapy- doesn't want opiates- mainly using tylenol-   11/9- d/w pt- she's willing to try tramadol 50 mg q6 hours prn- she's scared of Coding if takes Norco- I explained that Tramadol is mild and shouldn't cause any cognitive issues.   11/10- pain much better- not resolved, but better with tramadol- con't tramadol and tylenol prn  11/14- pain still doing better with tramadol  11/17- is worse today since had surgery yesterday-doesn't want any other meds  11/18 reports pain controlled, continue tramadol PRN  - 11/25 Dced sedating medications, no current pain.   4. Mood/Behavior/Sleep: team to provide ego support             -antipsychotic agents: n/a 5. Neuropsych/cognition: This patient is capable of making decisions on her own behalf. 6. Skin/Wound Care: dry dressings to left ear and hip  11/9- having some drainage- from L hip- is improved this Am- will monitor closely.  7. Fluids/Electrolytes/Nutrition: encourage appropriate PO             -pt's intake has inconsistent so far  - Improved 100-35% 11/25  11/27- was told eating/drinking a little more.  8. Nausea:              -might be positional or med related.              -also having associated gas             -pt told me she's been moving bowels, however I don't see a bm recorded             -will try sorbitol today, dulcolax suppository prn             -protonix 11/8- has ileus- per #15 below  11/9- still having nausea- will change diet to clear liquid per pt request- and monitor  11/10- on clear liquid diet-  pt wants to wait 1 more day before changing to regular diet- if not better tomorrow, will need to call GI  11/14- nausea resolved- back on regular diet  11/16- nauseated again  111/7- Put on IVFs for another 24 hours at 75cc/hour since had surgery at 10-11pm at night and didn't get back from PACU until 1am- sh'e sleepy and not taking in much  11/18 LBM today,  improved continue to monitor 11/20- LBM last night  11.21- checking KUB and ordered clear liquid diet- also put on IVFs, but holding until CXR back- since not drinking a lot  11/22- will stop IVFs- is drinking enough per labs- pt want sot wait to go back to regular until tomorrow  11/23- back to regular diet- with 1500cc fluid restriction  11/24- placed IVFs x 2 4 hours and recheck in AM  11/25 -NA, BUN, Cr stable on 75 cc/hr IVF; Dced HCTZ as would complicate further studies; recheck BMP in AM  11/27- Na 128- per hyponatremia 9. Left lower lobe consolidation             -pneumonia ruled out. Likely atelectasis             -IS, OOB             -wean oxygen to off  11/7- will write order to wean O2.   11/27- O2 0.5-1L O2 due to pneumonia 10. Sinus bradycardia:             -on propranolol and verapamil at home             -continue to hold as HR in 50's  11/14- since still in 50s, will not restart  11. Essential HTN             -continue hctz  11/14- BP usually controlled but up today- will monitor for trend  11/15- somewhat elevated, but not sure if dropping- see #21           11/16- started hydralazine  11/17- just got hydralazine- is slightly better- might need it increased over weekend  -11/19 good control overall continue to monitor  11/20- somewhat variable- due to ABLA likely  11/22- doing better  11/25 - DC HCTZ d/t IVF, hyponatremia as above - unable to give PO antihypertensives QHS Vitals:   10/20/22 0738 10/20/22 0923  BP:    Pulse:    Resp:    Temp:    SpO2: 98% 95%    12. Right middle lobe nodule 2.3 cm- should be considered primary lung CA until ruled out- also has nodule as well 51m on R middle lung- could be mets per CT scan.              -f/u as outpt  11/7- d/w daughter- she said no f/u at this time- due to pt's age.   11/8- daughter doesn't want to let pt know about tumors.    13. Mild leukocytosis- 11.2   11/6- afebrile- other than difficulty swallowing-  says it's dry?; denies UTI Sx's- if gets worse clinically, will recheck in AM- right now scheduled for Wed AM.   11/8- WBC down to 9.4-  14. Constipation  11/6- LBM on day of fall per pt and chart- will give Sorbitol after therapy and follow with SSE if needed.   11/7- had multiple BM's- didn't need SSE- per notes- feels cleaned out   11/19 LBM today, improved  11/27- LBM last night 15. ABLA  11/6- Down to 7.6-  will recheck Wednesday to make sure not dropping further- and transfuse if required.   11/8- Hb 7.3- on edge of needing transfusion.   11/9- Hb up to 8.0-   11/10- Hb 7.7- will con't to monitor closely.   11/15- Hb 8.4  11/20- Hb 6.4- will transfuse as below  11/25 - HgB stable ~9, however with ongoing gross hematuria; holding PM eliqius, continue plavix  16. Post op ileus resolved BM x 3 11/10, 1 BM 11/12- advance diet as tolerated   -Recheck level tomorrow  11/21- New n/V-- ordered another KUB to look for ileus and put back on clear liquid diet- also ordered IV compazine, PO/PR phenergan and d/c'd Zofran since can cause constipation  11/22- wants ot wait til tomorrow to go back to regular diet- but can be changed anytime pt wants to back to regular diet  11/27- was back to regular diet- eating slightly better 17. Thrush?  11/9- will add Magic mouthwash- don't see plaques, but tongue dry and furrowed. Says things taste 'wierd".   11/10- said magic mouthwash helping Sx's.  18. Leukocytosis improved   11/15- WBC down to 7.8  11/16- will recheck today to make sure not elevated  11/17- WBC stable at 8k  Recheck tomorrow  11/20- WBC down to 6.8  11/23- CRP 7.6- and ESR 21- doing great  11/25 - inc 13 from 10; allow 24 hours on new abx regimen; recheck in AM. Ucx NGTD, likely etiologies PNA vs. Ongoing L hip infection - both should be covered on daptomycin and rocephin  11/27- WBC back down to 9k- - ID also helping today due to questionable worsening pneumonia on CXR    Latest  Ref Rng & Units 10/20/2022    4:30 AM 10/19/2022    4:19 AM 10/18/2022    3:24 AM  CBC  WBC 4.0 - 10.5 K/uL 9.7  11.6  13.6   Hemoglobin 12.0 - 15.0 g/dL 9.5  9.4  9.4   Hematocrit 36.0 - 46.0 % 28.8  27.2  27.9   Platelets 150 - 400 K/uL 301  296  298     19. Hypokalemia and low Mg  11/10- repleted up to 3.5- will put on Kcl 10 mEq daily for now and recheck Monday- since K+ still  3.5- will also check Mg Monday as well   11/14= Mg 1.6 and K+ 3.1- repleted again  yesterday by pharmacy for [place]- will recheck labs iN AM  11/15- K+ 4.2- will give IV Mg 2G for Mg 1.6- and recheck in AM  11/16- Mg 1.7- still low- will put on PO Mg 400 mg BID  Recheck K+ and MG tomorrow  11/20- K+ 3.8 and Mg 1.8- will con't regimen  11/22- K+ 3.4- repleted again IV- repleted yesterday IV since was 3.0-   11/23- KCL- will replete since K+ 3.2 today- will give 40 mEq x2 and recheck in AM- will also change daily KCL to 20 mEq- Mg level 1.7  11/24- K+ 3.6  11/26 - K 3.8; monitor  20. Swallowing issues  11/14- appears to be thrush- will start Diflucan- due to renal issues, will need to be 50 mg daily- x 7 days doses-   11/15- reports things taste better and was able ot swallow some this AM of breakfast-con't diflucan  11/19 reports improved, continue to monitor  11/23- resolved- Diflucan finished  21. Dizziness/lightheadedness/presyncope  11/15- will have pt get orthostatics checked by nursing and therapy- BP on high side, so not sure  if Sx's due to BP being high or dropping.   11/16- added hydralazine 25 mg TID- is somewhat better today  11/20- BP a little on low side- likely due to ABLA- will hold BP meds if need be.  22. Infection of L hip?  11/16- still having drainage from L hip- spoke to Ortho- they are taking her to OR today at 5pm for wash out-   11/17- Gram stains negative so far- waiting for Cx's- surgeon gave Ancef x2 IV and will monitor Cultures  11/19 wound culture growing staph,Continue  vancomycin, surgery advising ID consult, called ID to discuss -consult placed  11/20- con't IV Vanc per ID- Cx's still pending final. 11/21- changed to Daptomycin, per ID- will double check labs today- CK is pending- didn't get done yesterday- will check labs today as well  11/22- CK looking good- 23- con't Daptomycin- stop date is 11/07/22 per ID  23. Urinary retention  11/16- is new- will check U/A and Cx- and labs- CBC- with diff and CMP just to make sure no other issues  11/17- U/A (-) and doesn't have elevated WBC- mild left shift however- will start Flomax 0.4 mg q supper  11/8 continent PVRs a little improved today< 320m, continue to monitor, continue flomax, IC if > 350 ml  11/23- placed foley since having such high volumes and will leave in til early next week- will try voiding trial Monday/Tuesday- will also increase Flomax to 0.8 q supper  11/24- will treat possible UTI- and possible pneumonia- WBC up slightly to 10.6 with L shift  11/25: See above; ongoing hematuria w/o growth on Ucx; ? UTI  24. ABLA anemia-  11/20- down to 6.4- will transfuse 2 units since so low.    11/21- labs today- pending  11/22- Hb 9.7  11/25: Held QHS eliquis 2.5 for gross hematuria; cbc in AM  25. SOB/questionable pneumonia/bibasilar opacities  11/21- pt c/o SOB- O2 sats 98% on 2L- has been on O2 since surgery last week- will check CXR and gave IV Lasix 40 mg x1 - and let pt know we need her to void. If CXR Negative, will need CTA of chest to look for PE.   11/22- down to 0.5 L of O2- and satting well- actually CO2 was too high on 2L- so reduced due to CO2 retention- SOB improved- of note, no leukocytosis  11/23- off O2 completely- and satting well  11/24- more SOB- still has Bibasilar opacities- hadn't treated earlier because lack of elevated WBC and afebrile- will treat now- also treat possible UTI per IM with Rocephin 1G q24 hours and wait for U Cx.   - 11/25: See above; if WBC continues uptrend in AM  despite broad IV abx and IVF, will get Bcx 2 and re-engage IM, ID for infectious workup, hyponatremia, and hematuria  11/27- WBC down to 9.7, however CXR looks worse- on Rocephin- have asked ID to come take a look at pt- they will come by today.  26. 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     I spent a total of  53  minutes on total care today- >50% coordination of care- due to  D/w ID and ID pharmacy as well as PA who called nephrology consult.   LOS: 22 days A FACE TO FACE EVALUATION WAS PERFORMED  Anna Weiss 10/20/2022, 1:29 PM

## 2022-10-20 NOTE — Progress Notes (Signed)
Called from RN at approximately 0900 hrs regarding peripheral edema and dyspnea. O2 sat is 95% on 0.5 L via [place]. Patient c/o SOB, no pain. Did eat some breakfast this morning. 1+ pitting edema of both ankles. Notes and labs reviewed and discussed with Dr. Dagoberto Ligas. Will consult nephrology. Beeper message sent.

## 2022-10-20 NOTE — Progress Notes (Signed)
Patient ID: Anna Weiss, female   DOB: 06-Sep-1925, 88 y.o.   MRN: [id number]  Met with pt and daughter who is present in [place] room. She reports she is having many medical issues and more MD's are coning in to evaluate her. She wants to be kept in the loop and feels at this level she is not ready nor is daughter able to take her home. Will touch base with daughter and provide support to pt. Will await medical input.

## 2022-10-20 NOTE — Consult Note (Signed)
[facility] Renal Consultation Note  Requesting MD: Courtney Heys, MD Indication for Consultation:  hyponatremia   Chief complaint: fell at home  HPI: Anna Weiss is a 88 y.o. female with a history of HTN, CVA, and HLD who presented to the hospital after a fall at home on 09/24/22.  She sustained a femur fracture and is s/p repair on 09/25/22.  She also has concern for PNA but this was later felt to be atelectasis.  She was transferred to [facility] inpatient rehab for deconditioning.  Nephrology is consulted for assistance with management of hyponatremia.  She was previously on HCTZ which has been stopped with last dose on 11/25.  She got lasix on 11/21 but hasn't since.  She has been on IV fluids for several days - stopped on 11/26 per charting.  She states that her breathing has been "hard".  She has been on low rate of oxygen here but not normally on oxygen at home.  She feels swollen.    PMHx:   Past Medical History:  Diagnosis Date   High cholesterol    Hypertension    Stroke [facility])    2003   Tachycardia     Past Surgical History:  Procedure Laterality Date   ABDOMINAL HYSTERECTOMY     I & D EXTREMITY Left 10/09/2022   Procedure: IRRIGATION AND DEBRIDEMENT LEFT HIP;  Surgeon: Callie Fielding, MD;  Location: [place];  Service: Orthopedics;  Laterality: Left;   INTRAMEDULLARY (IM) NAIL INTERTROCHANTERIC Left 09/25/2022   Procedure: INTRAMEDULLARY (IM) NAIL INTERTROCHANTERIC;  Surgeon: Callie Fielding, MD;  Location: [place];  Service: Orthopedics;  Laterality: Left;    Family Hx: she denies any family history of CKD or family history of thyroid disorders    Social History:  reports that she has never smoked. She has never used smokeless tobacco. She reports that she does not drink alcohol and does not use drugs.  Allergies:  Allergies  Allergen Reactions   Aspirin Nausea Only    Medications: Prior to Admission medications   Medication Sig Start Date End Date Taking?  Authorizing Provider  alum & mag hydroxide-simeth (MAALOX/MYLANTA) 200-200-20 MG/5ML suspension Take 30 mLs by mouth every 4 (four) hours as needed for indigestion or heartburn. 09/28/22   Charlynne Cousins, MD  Calcium Carb-Cholecalciferol (CALCIUM 600 + D PO) Take 2 tablets by mouth daily.    [provider]  clopidogrel (PLAVIX) 75 MG tablet Take 1 tablet (75 mg total) by mouth daily with breakfast. 10/22/22   Charlynne Cousins, MD  ferrous fumarate (HEMOCYTE - 106 MG FE) 325 (106 FE) MG TABS tablet Take 1 tablet by mouth daily.    [provider]  hydrochlorothiazide (HYDRODIURIL) 25 MG tablet  10/02/16   [provider]  levothyroxine (SYNTHROID, LEVOTHROID) 50 MCG tablet Take 50 mcg by mouth daily before breakfast.    [provider]  methocarbamol (ROBAXIN) 500 MG tablet Take 1 tablet (500 mg total) by mouth every 8 (eight) hours as needed for muscle spasms. 09/28/22   Charlynne Cousins, MD  methylcellulose (ARTIFICIAL TEARS) 1 % ophthalmic solution Place 1 drop into both eyes 2 (two) times daily.    [provider]  Multiple Vitamins-Minerals (MULTIVITAMIN WITH MINERALS) tablet Take 1 tablet by mouth daily.    [provider]  ondansetron (ZOFRAN ODT) 4 MG disintegrating tablet Take 1 tablet (4 mg total) by mouth every 8 (eight) hours as needed for nausea or vomiting. 01/02/14  Carmin Muskrat, MD  pantoprazole (PROTONIX) 40 MG tablet Take 1 tablet (40 mg total) by mouth daily. 09/28/22   Charlynne Cousins, MD  rivaroxaban (XARELTO) 10 MG TABS tablet Take 1 tablet (10 mg total) by mouth daily for 14 days. 09/27/22 10/11/22  Callie Fielding, MD  simvastatin (ZOCOR) 10 MG tablet Take 10 mg by mouth every evening.    [provider]    I have reviewed the patient's current and reported prior to admission medications.  Labs:     Latest Ref Rng & Units 10/20/2022    4:30 AM 10/19/2022    4:19 AM 10/18/2022    3:24 AM   BMP  Glucose 70 - 99 mg/dL 123  126  160   BUN 8 - 23 mg/dL '10  9  11   '$ Creatinine 0.44 - 1.00 mg/dL 0.54  0.51  0.64   Sodium 135 - 145 mmol/L 128  129  126   Potassium 3.5 - 5.1 mmol/L 3.5  3.4  3.8   Chloride 98 - 111 mmol/L 92  91  88   CO2 22 - 32 mmol/L '29  29  26   '$ Calcium 8.9 - 10.3 mg/dL 8.7  8.9  9.1     ROS:  Pertinent items noted in HPI and remainder of comprehensive ROS otherwise negative.   Physical Exam: Vitals:   10/20/22 0923 10/20/22 1402  BP:  (!) 159/65  Pulse:  69  Resp:  15  Temp:  98 F (36.7 C)  SpO2: 95% 92%     General:  elderly female in bed, weak   HEENT: NCAT Eyes: EOMI sclera anicteric Neck: supple trachea midline  Heart: S1S2 no rub Lungs: clear and reduced; unlabored on 1 liter oxygen  Abdomen: soft/nt/nd Extremities: 2+ edema lower extremities  Skin: no rash on extremities exposed  Psych no anxiety or agitation  Neuro: awake and answers basic questions on exam; tired  Assessment/Plan:  # Hyponatremia - Setting of HCTZ use as well as fluids.  On levothyroxine with acceptable dose  - She is overloaded - lasix 60 mg IV once now - Please do not resume HCTZ (if a diuretic is needed she would need lasix) - she is short of breath and anticipate urine/serum osms will be delayed - reassess ordering tomorrow and go ahead with IV lasix now   # HTN  - lasix 60 mg IV once now then reassess BP trends  - Please do not resume HCTZ   # Hypothyroidism  - on levothyroxine  - acceptable TSH on last check   # Macrocytic anemia - Improved overall - check b12 and folate - Will also check iron panel for completion  # Fall with femur fracture - s/p repair per ortho - appreciate rehab  # Deconditioning - per inpatient rehab team    Claudia Desanctis 10/20/2022, 5:02 PM

## 2022-10-20 NOTE — Progress Notes (Signed)
[facility] for Infectious Disease    Date of Admission:  09/28/2022   Day 4 ceftriaxone   ID: Anna Weiss is a 88 y.o. 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 No productive cough  No fevers; wbc down to 9.   Medications:   Chlorhexidine Gluconate Cloth  6 each Topical BID   clopidogrel  75 mg Oral Daily   feeding supplement  237 mL Oral BID BM   furosemide  60 mg Intravenous Once   hydrALAZINE  25 mg Oral Q8H   levothyroxine  50 mcg Oral Q0600   magic mouthwash  10 mL Oral QID   magnesium oxide  400 mg Oral BID   multivitamin with minerals  1 tablet Oral Daily   pantoprazole  40 mg Oral BID   polyvinyl alcohol  1 drop Both Eyes BID   potassium chloride  20 mEq Oral Daily   simvastatin  10 mg Oral QPM   tamsulosin  0.8 mg Oral QPC supper    Objective: Vital signs in last 24 hours: Temp:  [97.4 F (36.3 C)-98.2 F (36.8 C)] 97.4 F (36.3 C) (11/27 1628) Pulse Rate:  [58-70] 64 (11/27 1628) Resp:  [15-20] 15 (11/27 1628) BP: (137-168)/(55-65) 160/62 (11/27 1628) SpO2:  [92 %-99 %] 93 % (11/27 1628) Weight:  [70.2 kg] 70.2 kg (11/27 0514)  Physical Exam  Constitutional:  appears well-developed and well-nourished. No distress.  HENT: [place]/AT, PERRLA, no scleral icterus Mouth/Throat: Oropharynx is dry. No oropharyngeal exudate.  Cardiovascular: Normal rate, regular rhythm and normal heart sounds. Exam reveals no gallop and no friction rub.  No murmur heard.  Pulmonary/Chest: Effort normal and breath sounds normal. No respiratory distress.  has no wheezes.  Neck = supple, no nuchal rigidity Abdominal: Soft. Bowel sounds are normal.  exhibits no distension. There is no tenderness.  Lymphadenopathy: no cervical  adenopathy. No axillary adenopathy Skin: Skin is warm and dry. No rash noted. No erythema.    Lab Results Recent Labs    10/19/22 0419 10/20/22 0430  WBC 11.6* 9.7  HGB 9.4* 9.5*  HCT 27.2* 28.8*  NA 129* 128*  K 3.4* 3.5  CL 91* 92*  CO2 29 29  BUN 9 10  CREATININE 0.51 0.54   Liver Panel No results for input(s): "PROT", "ALBUMIN", "AST", "ALT", "ALKPHOS", "BILITOT", "BILIDIR", "IBILI" in the last 72 hours. Sedimentation Rate No results for input(s): "ESRSEDRATE" in the last 72 hours. C-Reactive Protein No results for input(s): "CRP" in the last 72 hours.  Microbiology: 11/16 staph epi and MRSA 11/24 urine cx NGTD Studies/Results: DG Chest 1 View  Result Date: 10/19/2022 CLINICAL DATA:  Cough EXAM: CHEST  1 VIEW COMPARISON:  10/17/2022 FINDINGS: The patient is rotated to the LEFT. Cardiomediastinal silhouette is not significantly changed. A RIGHT PICC line is present with tip difficult to visualize but appears to overlie the SVC. Bilateral airspace opacities are stable to slightly increased, more focal in the RIGHT UPPER lobe. Bibasilar opacities/atelectasis again noted with small bilateral pleural effusions. LEFT LOWER lung consolidation/atelectasis also appears slightly increased. There is no evidence of pneumothorax. IMPRESSION: 1. Slightly increased bilateral airspace opacities, more focal in the RIGHT UPPER lobe. 2. Slightly increased LEFT LOWER lung consolidation/atelectasis. 3. Unchanged bibasilar  opacities/atelectasis with small bilateral pleural effusions. Electronically Signed   By: Margarette Canada M.D.   On: 10/19/2022 15:24     Assessment/Plan: Hospital acquired pneumonia = continue with ceftriaxone. Will temporarily change daptomycin to linezolid so that there is MRSA coverage for pneumonia. Also will check ur legionella to see if that could be part of presentation.   MRSA deep tissue infection = will be covered by linezolid  [facility] Pager: [phone]  10/20/2022, 5:00 PM

## 2022-10-21 DIAGNOSIS — Z515 Encounter for palliative care: Secondary | ICD-10-CM

## 2022-10-21 DIAGNOSIS — J189 Pneumonia, unspecified organism: Secondary | ICD-10-CM

## 2022-10-21 DIAGNOSIS — Z7189 Other specified counseling: Secondary | ICD-10-CM

## 2022-10-21 DIAGNOSIS — R627 Adult failure to thrive: Secondary | ICD-10-CM

## 2022-10-21 LAB — BASIC METABOLIC PANEL
Anion gap: 8 (ref 5–15)
BUN: 11 mg/dL (ref 8–23)
CO2: 33 mmol/L — ABNORMAL HIGH (ref 22–32)
Calcium: 8.6 mg/dL — ABNORMAL LOW (ref 8.9–10.3)
Chloride: 89 mmol/L — ABNORMAL LOW (ref 98–111)
Creatinine, Ser: 0.47 mg/dL (ref 0.44–1.00)
GFR, Estimated: >60 mL/min (ref >60)
Glucose, Bld: 131 mg/dL — ABNORMAL HIGH (ref 70–99)
Potassium: 3 mmol/L — ABNORMAL LOW (ref 3.5–5.1)
Sodium: 130 mmol/L — ABNORMAL LOW (ref 135–145)

## 2022-10-21 LAB — LEGIONELLA PNEUMOPHILA SEROGP 1 UR AG: L. pneumophila Serogp 1 Ur Ag: NEGATIVE

## 2022-10-21 MED ORDER — MORPHINE SULFATE (CONCENTRATE) 10 MG/0.5ML PO SOLN
2.5000 mg | ORAL | Status: DC | PRN
  Administered 2022-10-24: 2.6 mg via SUBLINGUAL
  Filled 2022-10-21: qty 0.5

## 2022-10-21 MED ORDER — DAPTOMYCIN IV (FOR PTA / DISCHARGE USE ONLY): 600.0000 mg | INTRAVENOUS | 0 refills | Status: DC

## 2022-10-21 MED ORDER — LINEZOLID 600 MG PO TABS
600.0000 mg | ORAL_TABLET | Freq: Two times a day (BID) | ORAL | Status: DC
  Administered 2022-10-21 – 2022-10-28 (×14): 600 mg via ORAL
  Filled 2022-10-21 (×16): qty 1

## 2022-10-21 MED ORDER — MORPHINE SULFATE (CONCENTRATE) 10 MG/0.5ML PO SOLN
2.5000 mg | Freq: Every day | ORAL | Status: DC
  Administered 2022-10-21 – 2022-10-27 (×7): 2.6 mg via SUBLINGUAL
  Filled 2022-10-21 (×7): qty 0.5

## 2022-10-21 MED ORDER — FUROSEMIDE 10 MG/ML IJ SOLN
60.0000 mg | Freq: Once | INTRAMUSCULAR | Status: AC
  Administered 2022-10-21: 60 mg via INTRAVENOUS
  Filled 2022-10-21: qty 6

## 2022-10-21 MED ORDER — POLYSACCHARIDE IRON COMPLEX 150 MG PO CAPS
150.0000 mg | ORAL_CAPSULE | Freq: Every day | ORAL | Status: DC
  Administered 2022-10-21 – 2022-10-28 (×8): 150 mg via ORAL
  Filled 2022-10-21 (×8): qty 1

## 2022-10-21 MED ORDER — POTASSIUM CHLORIDE CRYS ER 20 MEQ PO TBCR
20.0000 meq | EXTENDED_RELEASE_TABLET | Freq: Once | ORAL | Status: AC
  Administered 2022-10-21: 20 meq via ORAL
  Filled 2022-10-21: qty 1

## 2022-10-21 MED ORDER — POTASSIUM CHLORIDE 20 MEQ PO PACK
40.0000 meq | PACK | Freq: Every day | ORAL | Status: DC
  Administered 2022-10-21: 40 meq via ORAL
  Filled 2022-10-21 (×2): qty 2

## 2022-10-21 NOTE — Progress Notes (Incomplete)
Inpatient Rehabilitation Discharge Medication Review by a Pharmacist  A complete drug regimen review was completed for this patient to identify any potential clinically significant medication issues.  High Risk Drug Classes Is patient taking? Indication by Medication  Antipsychotic No   Anticoagulant No   Antibiotic Yes Cubicin- MRSA deep tissue  Opioid No   Antiplatelet Yes Plavix- CVA ppx  Hypoglycemics/insulin No   Vasoactive Medication Yes Flomax- urinary hesitancy Hydralazine- isolated systolic HTN  Chemotherapy No   Other Yes Protonix- GERD Zocor- HLD Synthroid- Hypothyroidism     Type of Medication Issue Identified Description of Issue Recommendation(s)  Drug Interaction(s) (clinically significant)     Duplicate Therapy     Allergy     No Medication Administration End Date     Incorrect Dose     Additional Drug Therapy Needed     Significant med changes from prior encounter (inform family/care partners about these prior to discharge).    Other       Clinically significant medication issues were identified that warrant physician communication and completion of prescribed/recommended actions by midnight of the next day:  No   Time spent performing this drug regimen review (minutes):  30   Kirsti Mcalpine BS, PharmD, BCPS Clinical Pharmacist 10/21/2022 12:31 PM  Contact: [phone] after 3 PM  "Be curious, not judgmental..." -Jamal Maes

## 2022-10-21 NOTE — Progress Notes (Addendum)
Patient ID: Anna Weiss, female   DOB: May 27, 1925, 88 y.o.   MRN: [id number]  Met with pt who feels she id tired and not able to push through all of this anymore. Palliative consulted to meet with two children and discuss goals of care and pt's wishes. Therapies have QD due to not able to participate in therapies and multiple medical issues. Await palliative meeting  3;11 PM Stopped in to see pt and daughter was at her bedside she reports she still feels terrible and feels like she is choking. Not eating much due to this according to daughter. Daughter reports meeting again with Alicia-palliative tomorrow hopefully will make a decision which path to proceed on.

## 2022-10-21 NOTE — Consult Note (Addendum)
Consultation Note Date: 10/21/2022   Patient Name: Anna Weiss  DOB: 05-25-1925  MRN: [id number]  Age / Sex: 88 y.o., female  PCP: Anna Neer, MD Referring Physician: Courtney Heys, MD  Reason for Consultation: Establishing goals of care  HPI/Patient Profile: 88 y.o. female  with past medical history of hypertension, stroke 2003. Hospitalized 09/24/22-09/28/22 due to fall 09/24/22 with resultant parietal scalp hematoma, remote lacunar infarcts, left comminuted intertrochanteric fracture s/p surgical repair 09/25/22 and hospitalization complicated by pneumonia vs atelectasis and RML nodule found incidentally. She was admitted on 09/28/2022 to CIR. Required surgical irrigation and debridement for left hip infection 10/09/22 . 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 Overall failure to thrive given prolonged acute illness with multiple complications. Palliative care requested to address goals of care.   Clinical Assessment and Goals of Care: I have spent time completing thorough chart review of events of CIR stay and previous hospital stay. I spoke with RN. I met with Anna Weiss who is lying in bed. I introduced myself from palliative care to help speak with her and her family about her goals of care and where we go from here. She tells me that she is tired and "I just wish I could just stop breathing." I asked her permission to have a conversation with herself along with her daughter about this and she agrees. I called daughter Anna Weiss and made plan to meet as she is on her way up to the hospital.   Update: I met again at Anna Weiss's bedside. She has shared with her daughter already that she refused her  medications because she no longer wants to live this way. We spent time discussing continued care vs comfort care. We discussed hospice at home vs hospice facility. We discussed that at this stage of illness she is not expected to be able to return to her home and be able to live independently as she was before. After further discussion it was determined that she is not expected to improve to a quality of life that she finds acceptable. We discussed further about comfort care and symptom management. Anna Weiss struggles with the thought of stopping her antibiotics at this time. I urged her that we can take some more time to consider these options and we can always revisit how she feels tomorrow. At this time Anna Weiss would like something to help calm her breathing at night - discussed pain medications to provide shortness of breath relief at night and as needed. We will meet again tomorrow 11/29 ~1200 to revisit goals of care and assess symptom management. Anna Weiss is supportive of her mother's wishes.   I called and spoke with Anna Weiss's son, Anna Weiss, per her request. I updated him on the conversation above and my goal to help [name] clarify her wishes for path forward. Anna Weiss is somewhat surprised but  also understands that his mother has had a long and difficult illness which has been extremely difficult given her advanced age. Anna Weiss had good questions about expectations moving forward. I honestly shared that I worry that even with continued treatment that there will be further complications and barriers to improvement that are going to ultimately lead to further decline in health and quality of life. Anna Weiss reports that he will respect his mother's wishes as well. He is out of town and unable to come at present time. I will call and update him again tomorrow after our meeting.   All questions/concerns addressed. Emotional support provided. Updated Anna Clap, PA.   Primary Decision Maker PATIENT     SUMMARY OF RECOMMENDATIONS   - DNR - Considering full comfort care and hospice options - Follow up family meeting planned for tomorrow 11/29 ~1200  Code Status/Advance Care Planning: DNR   Symptom Management:  Shortness of breath: Roxanol 0.25 mg SL qhs and q4h PRN. If this does not help may consider anxiety medication.   Prognosis:  Overall prognosis poor. If full comfort care elected likely < 2 weeks and potentially eligible for hospice facility consideration.   Discharge Planning: To Be Determined      Primary Diagnoses: Present on Admission: **None**   I have reviewed the medical record, interviewed the patient and family, and examined the patient. The following aspects are pertinent.  Past Medical History:  Diagnosis Date   High cholesterol    Hypertension    Stroke [facility])    2003   Tachycardia    Social History   Socioeconomic History   Marital status: Widowed    Spouse name: Not on file   Number of children: Not on file   Years of education: Not on file   Highest education level: Not on file  Occupational History   Not on file  Tobacco Use   Smoking status: Never   Smokeless tobacco: Never  Vaping Use   Vaping Use: Never used  Substance and Sexual Activity   Alcohol use: No   Drug use: No   Sexual activity: Not on file  Other Topics Concern   Not on file  Social History Narrative   Not on file   Social Determinants of Health   Financial Resource Strain: Not on file  Food Insecurity: No Food Insecurity (09/25/2022)   Hunger Vital Sign    Worried About Running Out of Food in the Last Year: Never true    Ran Out of Food in the Last Year: Never true  Transportation Needs: No Transportation Needs (09/25/2022)   PRAPARE - Hydrologist (Medical): No    Lack of Transportation (Non-Medical): No  Physical Activity: Not on file  Stress: Not on file  Social Connections: Not on file   History reviewed. No pertinent family  history. Scheduled Meds:  Chlorhexidine Gluconate Cloth  6 each Topical BID   clopidogrel  75 mg Oral Daily   feeding supplement  237 mL Oral BID BM   hydrALAZINE  25 mg Oral Q8H   levothyroxine  50 mcg Oral Q0600   magic mouthwash  10 mL Oral QID   magnesium oxide  400 mg Oral BID   multivitamin with minerals  1 tablet Oral Daily   pantoprazole  40 mg Oral BID   polyvinyl alcohol  1 drop Both Eyes BID   potassium chloride  20 mEq Oral Daily   simvastatin  10 mg Oral QPM  tamsulosin  0.8 mg Oral QPC supper   Continuous Infusions:  sodium chloride Stopped (10/16/22 2330)   cefTRIAXone (ROCEPHIN)  IV 1 g (10/20/22 1241)   linezolid (ZYVOX) IV 600 mg (10/21/22 0834)   PRN Meds:.sodium chloride, acetaminophen, albuterol, alum & mag hydroxide-simeth, antiseptic oral rinse, bisacodyl, Measure post void residual **AND** In and Out Cath **AND** lidocaine, lip balm, mouth rinse, sodium chloride flush, sorbitol Allergies  Allergen Reactions   Aspirin Nausea Only   Review of Systems  Constitutional:  Positive for activity change, appetite change and fatigue.  HENT:  Positive for trouble swallowing.   Respiratory:  Positive for shortness of breath.   Neurological:  Positive for weakness. Negative for speech difficulty.  Psychiatric/Behavioral:  Positive for sleep disturbance.     Physical Exam Vitals and nursing note reviewed.  Constitutional:      General: She is not in acute distress.    Appearance: She is ill-appearing.  Cardiovascular:     Rate and Rhythm: Normal rate.  Pulmonary:     Effort: No tachypnea, accessory muscle usage or respiratory distress.     Comments: Poor reserve but no distress at rest Abdominal:     General: Abdomen is flat.     Palpations: Abdomen is soft.  Neurological:     Mental Status: She is alert and oriented to person, place, and time.     Vital Signs: BP (!) 144/53 (BP Location: Left Arm)   Pulse 78   Temp 98.5 F (36.9 C)   Resp 18   Ht  _0  (1.702 m)   Wt 71.4 kg   SpO2 94%   BMI 24.65 kg/m  Pain Scale: 0-10   Pain Score: 0-No pain   SpO2: SpO2: 94 % O2 Device:SpO2: 94 % O2 Flow Rate: .O2 Flow Rate (L/min): 1 L/min  IO: Intake/output summary:  Intake/Output Summary (Last 24 hours) at 10/21/2022 1003 Last data filed at 10/21/2022 3875 Gross per 24 hour  Intake 300 ml  Output 2150 ml  Net -1850 ml    LBM: Last BM Date : 10/20/22 Baseline Weight: Weight: 72.7 kg Most recent weight: Weight: 71.4 kg     Palliative Assessment/Data:     Time In: 1030  Time Total: 85 min  Greater than 50%  of this time was spent counseling and coordinating care related to the above assessment and plan.  Signed by: Vinie Sill, NP Palliative Medicine Team Pager # [phone] (M-F 8a-5p) Team Phone # [phone] (Nights/Weekends)

## 2022-10-21 NOTE — Progress Notes (Signed)
Occupational Therapy Weekly Progress Note  Patient Details  Name: Anna Weiss MRN: [id number] Date of Birth: 11-09-1925  Beginning of progress report period: October 14, 2022 End of progress report period: October 21, 2022  Today's Date: 10/21/2022 OT Individual Time: 1st Session: Missed 45 minutes 845-930 due to illness; 2nd Session: Missed 60 min due to illness 1430-1530       Patient has met 0 of 4 short term goals.  Pt has been on medical hold for most of the past 7 days. Has only participated minimally in OT yesterday for bed level simple feeding and face washing otherwise has been unable to tolerate therapy. Palliative care has been consulted and discharge disposition being adjusted with MSW working closely with family and team.   Patient continues to demonstrate the following deficits: muscle weakness and muscle joint tightness, decreased cardiorespiratoy endurance and decreased oxygen support, unbalanced muscle activation, and decreased sitting balance, decreased standing balance, decreased postural control, and decreased balance strategies and therefore will continue to benefit from skilled OT intervention to enhance overall performance with BADL, iADL, and Reduce care partner burden.  Patient not progressing toward long term goals.  See goal revisions from last week with now MD adjusting pt to QD.   OT Short Term Goals Week 3:  OT Short Term Goal 1 (Week 3): Pt will transfer bed to DABSC with mod A OT Short Term Goal 2 (Week 3): Pt will bathe sink side with min A using AE OT Short Term Goal 3 (Week 3): Pt will stand for 1 minute for simple ADL's with support with min A  Skilled Therapeutic Interventions/Progress Updates:  1st Session: therapy visit held by nursing due to medical. As per Team Conference discussion Palliative Care consulted and pt's therapy schedule has been reduced to QD.   2nd Session:   Attempted visit. Palliative Care c/s had been placed as per chart  review and pt feeling "SOB" when OT checked in. Informed Caryl Pina nurse who came into room. OT left pt in care of nursing and dtr bedside.   Therapy Documentation Precautions:  Precautions Precautions: Fall, Other (comment), contact  Precaution Comments: monitor O2, orthostatics Restrictions Weight Bearing Restrictions: Yes LLE Weight Bearing: Weight bearing as tolerated    Therapy/Group: Individual Therapy  [name] 10/21/2022, 7:36 AM

## 2022-10-21 NOTE — Progress Notes (Addendum)
Physical Therapy Weekly Progress Note  Patient Details  Name: Anna Weiss MRN: [id number] Date of Birth: January 03, 1925  Beginning of progress report period: September 29, 2022 End of progress report period: October 21, 2022  Short term goals = Long term goals due to extension in stay due to medical issues. Pt updated to QD.   Patient progressing toward long term goals..  Continue plan of care.  PT Short Term Goals Week 1:  PT Short Term Goal 1 (Week 1): pt will transfer supine<>sitting EOB with min A consistantly PT Short Term Goal 1 - Progress (Week 1): Met PT Short Term Goal 2 (Week 1): pt will transfer sit<>stand with LRAD and mod A PT Short Term Goal 2 - Progress (Week 1): Met PT Short Term Goal 3 (Week 1): pt will transfer bed<>WC with LRAD and mod A PT Short Term Goal 3 - Progress (Week 1): Met Week 2:  PT Short Term Goal 1 (Week 2): STG=LTG due to ELOS PT Short Term Goal 1 - Progress (Week 2): Progressing toward goal Week 3:  PT Short Term Goal 2 (Week 3): STG=LTG due to extended stay due to medical issues  Skilled Therapeutic Interventions/Progress Updates:      Therapy Documentation Precautions:  Precautions Precautions: Fall, Other (comment) Precaution Comments: monitor O2, orthostatics Restrictions Weight Bearing Restrictions: Yes LLE Weight Bearing: Weight bearing as tolerated    Therapy/Group: Individual Therapy  [name] [name] PT, DPT  10/21/2022, 4:26 PM

## 2022-10-21 NOTE — Progress Notes (Signed)
PROGRESS NOTE   Subjective/Complaints: Patient feels fatigued this morning She feels she is getting too many medications.  Appreciate Dr. Graylon Good seeing her yesterday Changed to QD.    ROS: Limited by cognition, but more able to answer questions. +fatigue   Objective:   DG Chest 1 View  Result Date: 10/19/2022 CLINICAL DATA:  Cough EXAM: CHEST  1 VIEW COMPARISON:  10/17/2022 FINDINGS: The patient is rotated to the LEFT. Cardiomediastinal silhouette is not significantly changed. A RIGHT PICC line is present with tip difficult to visualize but appears to overlie the SVC. Bilateral airspace opacities are stable to slightly increased, more focal in the RIGHT UPPER lobe. Bibasilar opacities/atelectasis again noted with small bilateral pleural effusions. LEFT LOWER lung consolidation/atelectasis also appears slightly increased. There is no evidence of pneumothorax. IMPRESSION: 1. Slightly increased bilateral airspace opacities, more focal in the RIGHT UPPER lobe. 2. Slightly increased LEFT LOWER lung consolidation/atelectasis. 3. Unchanged bibasilar opacities/atelectasis with small bilateral pleural effusions. Electronically Signed   By: Margarette Canada M.D.   On: 10/19/2022 15:24   Recent Labs    10/19/22 0419 10/20/22 0430  WBC 11.6* 9.7  HGB 9.4* 9.5*  HCT 27.2* 28.8*  PLT 296 301    Recent Labs    10/20/22 0430 10/21/22 0350  NA 128* 130*  K 3.5 3.0*  CL 92* 89*  CO2 29 33*  GLUCOSE 123* 131*  BUN 10 11  CREATININE 0.54 0.47  CALCIUM 8.7* 8.6*    Intake/Output Summary (Last 24 hours) at 10/21/2022 1127 Last data filed at 10/21/2022 8115 Gross per 24 hour  Intake 300 ml  Output 2150 ml  Net -1850 ml     Pressure Injury 10/16/22 Ischial tuberosity Right Stage 2 -  Partial thickness loss of dermis presenting as a shallow open injury with a red, pink wound bed without slough. (Active)  10/16/22 0900  Location: Ischial  tuberosity  Location Orientation: Right  Staging: Stage 2 -  Partial thickness loss of dermis presenting as a shallow open injury with a red, pink wound bed without slough.  Wound Description (Comments):   Present on Admission: No     Pressure Injury 10/16/22 Right Stage 2 -  Partial thickness loss of dermis presenting as a shallow open injury with a red, pink wound bed without slough. (Active)  10/16/22 0900  Location:   Location Orientation: Right  Staging: Stage 2 -  Partial thickness loss of dermis presenting as a shallow open injury with a red, pink wound bed without slough.  Wound Description (Comments):   Present on Admission: No    Physical Exam: Vital Signs Blood pressure (!) 144/53, pulse 78, temperature 98.5 F (36.9 C), resp. rate 18, height _0  (1.702 m), weight 71.4 kg, SpO2 94 %. General: awake, alert, but very sleepy; supine in bed; on 1L O2, more color in her face; NAD, fatigued HENT: conjugate gaze; oropharynx dry- helped her drink CV: regular rate; no JVD Pulmonary:decreased on R lung in general and a few rhonchi heard on L side GI: soft, NT, ND, (+)BS Psychiatric: somewhat more interactive Extremities: 1+ LE edema B/L  Neurological: more alert, more responsive and more interactive, but still  NOT baseline Musculoskeletal:     Cervical back: Normal range of motion.     Comments: Nontender on palpation of bilateral hips, thighs on exam Skin: L hip less swelling; dressing C/D/I    General: Skin is warm.     Findings: Bruising (arms) present. No signs of ecchymosis or diffuse bruising.     Comments: Left hip incision CDI, dressing dry  Neurological: confused, nonverbal but able to follow 2/3 simple commands in all 4 extremities.    Cranial Nerves: No cranial nerve deficit.     Comments: Motor: 4-5/5 Ue's. Sitting upright at EOB with Mod A.   Assessment/Plan: 1. Functional deficits which require 3+ hours per day of interdisciplinary therapy in a comprehensive  inpatient rehab setting. Physiatrist is providing close team supervision and 24 hour management of active medical problems listed below. Physiatrist and rehab team continue to assess barriers to discharge/monitor patient progress toward functional and medical goals  Care Tool:  Bathing    Body parts bathed by patient: Face, Abdomen, Chest   Body parts bathed by helper: Right arm, Left arm, Front perineal area, Buttocks, Right upper leg, Left upper leg, Right lower leg, Left lower leg     Bathing assist Assist Level: Maximal Assistance - Patient 24 - 49%     Upper Body Dressing/Undressing Upper body dressing   What is the patient wearing?: Hospital gown only    Upper body assist Assist Level: Maximal Assistance - Patient 25 - 49%    Lower Body Dressing/Undressing Lower body dressing      What is the patient wearing?: Hospital gown only     Lower body assist Assist for lower body dressing: Maximal Assistance - Patient 25 - 49%     Toileting Toileting    Toileting assist Assist for toileting: Dependent - Patient 0%     Transfers Chair/bed transfer  Transfers assist  Chair/bed transfer activity did not occur: Safety/medical concerns (dizziness, pain, decreased balance)  Chair/bed transfer assist level: Minimal Assistance - Patient > 75%     Locomotion Ambulation   Ambulation assist   Ambulation activity did not occur: Safety/medical concerns (dizziness, pain, decreased balance)  Assist level: Minimal Assistance - Patient > 75% Assistive device: Walker-rolling Max distance: 38   Walk 10 feet activity   Assist  Walk 10 feet activity did not occur: Safety/medical concerns (dizziness, pain, decreased balance)  Assist level: Minimal Assistance - Patient > 75% Assistive device: Walker-rolling   Walk 50 feet activity   Assist Walk 50 feet with 2 turns activity did not occur: Safety/medical concerns         Walk 150 feet activity   Assist Walk 150  feet activity did not occur: Safety/medical concerns         Walk 10 feet on uneven surface  activity   Assist Walk 10 feet on uneven surfaces activity did not occur: Safety/medical concerns (dizziness, pain, decreased balance)         Wheelchair     Assist Is the patient using a wheelchair?: Yes Type of Wheelchair: Manual Wheelchair activity did not occur: Safety/medical concerns (dizziness, pain, decreased balance)         Wheelchair 50 feet with 2 turns activity    Assist    Wheelchair 50 feet with 2 turns activity did not occur: Safety/medical concerns (dizziness, pain, decreased balance)       Wheelchair 150 feet activity     Assist  Wheelchair 150 feet activity did not occur: Safety/medical concerns (  dizziness, pain, decreased balance)       Blood pressure (!) 144/53, pulse 78, temperature 98.5 F (36.9 C), resp. rate 18, height _0  (1.702 m), weight 71.4 kg, SpO2 94 %.  Medical Problem List and Plan: 1. Functional deficits secondary to left intertrochanteric femur fx after fall. Pt s/p IMN 09/25/22             -LLE WBAT             -patient may shower             -ELOS/Goals: 15-20 days  Con't CIR- PT and OT  Decrease to QD 2.  Hematuria: d/c Eliquis.Resumed plavix 27m daily 3. Pain: continue SL morphine PRN for pain. tylenol and hydrocodone prn for pain             -robaxin prn for muscle spasms  11/8- pain limiting therapy- doesn't want opiates- mainly using tylenol-   11/9- d/w pt- she's willing to try tramadol 50 mg q6 hours prn- she's scared of Coding if takes Norco- I explained that Tramadol is mild and shouldn't cause any cognitive issues.   11/10- pain much better- not resolved, but better with tramadol- con't tramadol and tylenol prn  11/14- pain still doing better with tramadol  11/17- is worse today since had surgery yesterday-doesn't want any other meds  11/18 reports pain controlled, continue tramadol PRN  - 11/25 Dced  sedating medications, no current pain.   4. Mood/Behavior/Sleep: team to provide ego support             -antipsychotic agents: n/a 5. Neuropsych/cognition: This patient is capable of making decisions on her own behalf. 6. Skin/Wound Care: dry dressings to left ear and hip  11/9- having some drainage- from L hip- is improved this Am- will monitor closely.  7. Fluids/Electrolytes/Nutrition: encourage appropriate PO             -pt's intake has inconsistent so far  - Improved 100-35% 11/25  11/27- was told eating/drinking a little more.  8. Nausea:              -might be positional or med related.              -also having associated gas             -pt told me she's been moving bowels, however I don't see a bm recorded             -will try sorbitol today, dulcolax suppository prn             -protonix 11/8- has ileus- per #15 below  11/9- still having nausea- will change diet to clear liquid per pt request- and monitor  11/10- on clear liquid diet- pt wants to wait 1 more day before changing to regular diet- if not better tomorrow, will need to call GI  11/14- nausea resolved- back on regular diet  11/16- nauseated again  111/7- Put on IVFs for another 24 hours at 75cc/hour since had surgery at 10-11pm at night and didn't get back from PACU until 1am- sh'e sleepy and not taking in much  11/18 LBM today, improved continue to monitor 11/20- LBM last night  11.21- checking KUB and ordered clear liquid diet- also put on IVFs, but holding until CXR back- since not drinking a lot  11/22- will stop IVFs- is drinking enough per labs- pt want sot wait to go back to regular until tomorrow  11/23- back to  regular diet- with 1500cc fluid restriction  11/24- placed IVFs x 2 4 hours and recheck in AM  11/25 -NA, BUN, Cr stable on 75 cc/hr IVF; Dced HCTZ as would complicate further studies; recheck BMP in AM  11/27- Na 128- per hyponatremia 9. Left lower lobe consolidation             -pneumonia  ruled out. Likely atelectasis             -IS, OOB             -wean oxygen to off  11/7- will write order to wean O2.   11/27- O2 0.5-1L O2 due to pneumonia 10. Sinus bradycardia:             -on propranolol and verapamil at home             -continue to hold as HR in 50's  11/14- since still in 50s, will not restart  11. Essential HTN             -continue hctz  11/14- BP usually controlled but up today- will monitor for trend  11/15- somewhat elevated, but not sure if dropping- see #21           11/16- started hydralazine  11/17- just got hydralazine- is slightly better- might need it increased over weekend  -11/19 good control overall continue to monitor  11/20- somewhat variable- due to ABLA likely  11/22- doing better  11/25 - DC HCTZ d/t IVF, hyponatremia as above - unable to give PO antihypertensives QHS Vitals:   10/21/22 0131 10/21/22 0436  BP: (!) 132/53 (!) 144/53  Pulse: 80 78  Resp: 16 18  Temp: 97.6 F (36.4 C) 98.5 F (36.9 C)  SpO2: 91% 94%    12. Right middle lobe nodule 2.3 cm- should be considered primary lung CA until ruled out- also has nodule as well 32m on R middle lung- could be mets per CT scan.              -f/u as outpt  11/7- d/w daughter- she said no f/u at this time- due to pt's age.   11/8- daughter doesn't want to let pt know about tumors.    13. Mild leukocytosis- 11.2   11/6- afebrile- other than difficulty swallowing- says it's dry?; denies UTI Sx's- if gets worse clinically, will recheck in AM- right now scheduled for Wed AM.   11/8- WBC down to 9.4-  14. Constipation  11/6- LBM on day of fall per pt and chart- will give Sorbitol after therapy and follow with SSE if needed.   11/7- had multiple BM's- didn't need SSE- per notes- feels cleaned out   11/19 LBM today, improved  11/27- LBM last night 15. ABLA  11/6- Down to 7.6- will recheck Wednesday to make sure not dropping further- and transfuse if required.   11/8- Hb 7.3- on edge of  needing transfusion.   11/9- Hb up to 8.0-   11/10- Hb 7.7- will con't to monitor closely.   11/15- Hb 8.4  11/20- Hb 6.4- will transfuse as below  11/25 - HgB stable ~9, however with ongoing gross hematuria; holding PM eliqius, continue plavix  16. Post op ileus resolved BM x 3 11/10, 1 BM 11/12- advance diet as tolerated   -Recheck level tomorrow  11/21- New n/V-- ordered another KUB to look for ileus and put back on clear liquid diet- also ordered IV compazine, PO/PR phenergan and d/c'd Zofran  since can cause constipation  11/22- wants ot wait til tomorrow to go back to regular diet- but can be changed anytime pt wants to back to regular diet  11/27- was back to regular diet- eating slightly better 17. Thrush?  11/9- will add Magic mouthwash- don't see plaques, but tongue dry and furrowed. Says things taste 'wierd".   11/10- said magic mouthwash helping Sx's.  18. Leukocytosis improved   11/15- WBC down to 7.8  11/16- will recheck today to make sure not elevated  11/17- WBC stable at 8k  Recheck tomorrow  11/20- WBC down to 6.8  11/23- CRP 7.6- and ESR 21- doing great  11/25 - inc 13 from 10; allow 24 hours on new abx regimen; recheck in AM. Ucx NGTD, likely etiologies PNA vs. Ongoing L hip infection - both should be covered on daptomycin and rocephin  11/27- WBC back down to 9k- - ID also helping today due to questionable worsening pneumonia on CXR    Latest Ref Rng & Units 10/20/2022    4:30 AM 10/19/2022    4:19 AM 10/18/2022    3:24 AM  CBC  WBC 4.0 - 10.5 K/uL 9.7  11.6  13.6   Hemoglobin 12.0 - 15.0 g/dL 9.5  9.4  9.4   Hematocrit 36.0 - 46.0 % 28.8  27.2  27.9   Platelets 150 - 400 K/uL 301  296  298     19. Hypokalemia and low Mg  11/10- repleted up to 3.5- will put on Kcl 10 mEq daily for now and recheck Monday- since K+ still  3.5- will also check Mg Monday as well   11/14= Mg 1.6 and K+ 3.1- repleted again  yesterday by pharmacy for [place]- will recheck labs iN  AM  11/15- K+ 4.2- will give IV Mg 2G for Mg 1.6- and recheck in AM  11/16- Mg 1.7- still low- will put on PO Mg 400 mg BID  Recheck K+ and MG tomorrow  11/20- K+ 3.8 and Mg 1.8- will con't regimen  11/22- K+ 3.4- repleted again IV- repleted yesterday IV since was 3.0-   11/23- KCL- will replete since K+ 3.2 today- will give 40 mEq x2 and recheck in AM- will also change daily KCL to 20 mEq- Mg level 1.7  11/24- K+ 3.6  11/26 - K 3.8; monitor  20. Swallowing issues  11/14- appears to be thrush- will start Diflucan- due to renal issues, will need to be 50 mg daily- x 7 days doses-   11/15- reports things taste better and was able ot swallow some this AM of breakfast-con't diflucan  11/19 reports improved, continue to monitor  11/23- resolved- Diflucan finished  21. Dizziness/lightheadedness/presyncope  11/15- will have pt get orthostatics checked by nursing and therapy- BP on high side, so not sure if Sx's due to BP being high or dropping.   11/16- added hydralazine 25 mg TID- is somewhat better today  11/20- BP a little on low side- likely due to ABLA- will hold BP meds if need be.  22. Infection of L hip?  11/16- still having drainage from L hip- spoke to Ortho- they are taking her to OR today at 5pm for wash out-   11/17- Gram stains negative so far- waiting for Cx's- surgeon gave Ancef x2 IV and will monitor Cultures  11/19 wound culture growing staph,Continue vancomycin, surgery advising ID consult, called ID to discuss -consult placed  11/20- con't IV Vanc per ID- Cx's still pending final. 11/21-  changed to Daptomycin, per ID- will double check labs today- CK is pending- didn't get done yesterday- will check labs today as well  11/22- CK looking good- 23- con't Daptomycin- stop date is 11/07/22 per ID  23. Urinary retention  11/16- is new- will check U/A and Cx- and labs- CBC- with diff and CMP just to make sure no other issues  11/17- U/A (-) and doesn't have elevated WBC- mild  left shift however- will start Flomax 0.4 mg q supper  11/8 continent PVRs a little improved today< 367m, continue to monitor, continue flomax, IC if > 350 ml  11/23- placed foley since having such high volumes and will leave in til early next week- will try voiding trial Monday/Tuesday- will also increase Flomax to 0.8 q supper  11/24- will treat possible UTI- and possible pneumonia- WBC up slightly to 10.6 with L shift  11/25: See above; ongoing hematuria w/o growth on Ucx; ? UTI  24. ABLA anemia-  11/20- down to 6.4- will transfuse 2 units since so low.    11/21- labs today- pending  11/22- Hb 9.7  11/25: Held QHS eliquis 2.5 for gross hematuria; cbc in AM  25. SOB/questionable pneumonia/bibasilar opacities  11/21- pt c/o SOB- O2 sats 98% on 2L- has been on O2 since surgery last week- will check CXR and gave IV Lasix 40 mg x1 - and let pt know we need her to void. If CXR Negative, will need CTA of chest to look for PE.   11/22- down to 0.5 L of O2- and satting well- actually CO2 was too high on 2L- so reduced due to CO2 retention- SOB improved- of note, no leukocytosis  11/23- off O2 completely- and satting well  11/24- more SOB- still has Bibasilar opacities- hadn't treated earlier because lack of elevated WBC and afebrile- will treat now- also treat possible UTI per IM with Rocephin 1G q24 hours and wait for U Cx.   - 11/25: See above; if WBC continues uptrend in AM despite broad IV abx and IVF, will get Bcx 2 and re-engage IM, ID for infectious workup, hyponatremia, and hematuria  11/27- WBC down to 9.7, however CXR looks worse- on Rocephin- have asked ID to come take a look at pt- they will come by today.   11/28: Linezolid started 26. 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   LOS: 23 days A FACE TO FACE EVALUATION WAS PERFORMED  KClide DeutscherRaulkar 10/21/2022, 11:27 AM

## 2022-10-21 NOTE — Progress Notes (Signed)
Orthopedic Surgery Post-operative Note   Assessment: Patient is a 88 y.o. female with left intertrochanteric femur fracture status post IMN (09/25/2022), developed persistent drainage and now s/p I&D (10/09/2022)   Plan: -Operative plans: complete -Will take down dressings and looks at incision on 11/30 -Encouraged her to keep eating as much as she can -Infectious disease consulted, appreciate recs -Keep dressing dry and in place at all times -Diet: regular -DVT ppx: per primary -Antibiotics: per ID -Weight bearing status: as tolerated -PT/OT evaluate and treat -Pain control -Dispo: per primary  ___________________________________________________________________________  Subjective: No acute events overnight. States her hip feels as if she never had surgery on it. Is feeling very tired. Has not been eating much and does not have an appetite. Just talked with palliative care and is thinking things over.  Doesn't feel like she has much fight left in her and doesn't have any energy. I told her I am here to help her in any way. She did not have any questions for me at this time.   Physical Exam:  General: no acute distress, laying in bed Respiratory: unlabored breathing, symmetric chest rise  Neurologic: sleeping deeply, does not awake to voice   MSK:   -Left lower extremity  Distal dressing with small amount of bloody drainage and intact (same as last visit), proximal dressing c/d/I  EHL/TA/GSC intact   Sensation intact to light touch in s/s/dp/sp/t nerve distributions Foot warm and well perfused   Patient name: Anna Weiss Patient MRN: [id number] Date: 10/21/22

## 2022-10-21 NOTE — Progress Notes (Signed)
Physical Therapy Session Note  Patient Details  Name: Anna Weiss MRN: [id number] Date of Birth: 1925-07-03  Today's Date: 10/21/2022 PT Missed Time: 45 Minutes Missed Time Reason: Patient ill (Comment) (medical issues)  Short Term Goals: Week 2:  PT Short Term Goal 1 (Week 2): STG=LTG due to ELOS  Skilled Therapeutic Interventions/Progress Updates:      Therapy Documentation Precautions:  Precautions Precautions: Fall, Other (comment) Precaution Comments: monitor O2, orthostatics Restrictions Weight Bearing Restrictions: Yes LLE Weight Bearing: Weight bearing as tolerated   Pt not appropriate for physical therapy per nursing due to medical illness.    Therapy/Group: Individual Therapy  [name] [name] PT, DPT  10/21/2022, 7:41 AM

## 2022-10-21 NOTE — Progress Notes (Signed)
    [facility] for Infectious Disease    Date of Admission:  09/28/2022   Total days of antibiotics    ID: Anna Weiss is a 88 y.o. female with MRSA deep wound infection Principal Problem:   Fracture, intertrochanteric, left femur, sequela Active Problems:   Drainage from wound    Subjective: More alert this morning. Has mild cough still. +fatigue, an some mild hip pain.  Medications:   Chlorhexidine Gluconate Cloth  6 each Topical BID   clopidogrel  75 mg Oral Daily   feeding supplement  237 mL Oral BID BM   hydrALAZINE  25 mg Oral Q8H   levothyroxine  50 mcg Oral Q0600   linezolid  600 mg Oral Q12H   magic mouthwash  10 mL Oral QID   morphine CONCENTRATE  2.6 mg Sublingual QHS   pantoprazole  40 mg Oral BID   polyvinyl alcohol  1 drop Both Eyes BID   potassium chloride  20 mEq Oral Daily   tamsulosin  0.8 mg Oral QPC supper    Objective: Vital signs in last 24 hours: Temp:  [97.4 F (36.3 C)-98.5 F (36.9 C)] 97.8 F (36.6 C) (11/28 1455) Pulse Rate:  [64-80] 66 (11/28 1455) Resp:  [15-18] 16 (11/28 1455) BP: (132-160)/(51-62) 149/53 (11/28 1455) SpO2:  [90 %-94 %] 90 % (11/28 1455) Weight:  [71.4 kg] 71.4 kg (11/28 0500)  Physical Exam  Constitutional:  oriented to person, place, and time. appears well-developed and well-nourished. No distress.  HENT: [place]/AT, PERRLA, no scleral icterus Mouth/Throat: Oropharynx is clear and moist. No oropharyngeal exudate.  Cardiovascular: Normal rate, regular rhythm and normal heart sounds. Exam reveals no gallop and no friction rub.  No murmur heard.  Pulmonary/Chest: Effort normal and breath sounds normal. No respiratory distress.  +rhonchi Neck = supple, no nuchal rigidity Abdominal: Soft. Bowel sounds are normal.  exhibits no distension. There is no tenderness.  Lymphadenopathy: no cervical adenopathy. No axillary adenopathy Neurological: alert and oriented to person, place, and time.  Skin: Skin is warm and dry. No  rash noted. No erythema.  Psychiatric: a normal mood and affect.  behavior is normal.    Lab Results Recent Labs    10/19/22 0419 10/20/22 0430 10/21/22 0350  WBC 11.6* 9.7  --   HGB 9.4* 9.5*  --   HCT 27.2* 28.8*  --   NA 129* 128* 130*  K 3.4* 3.5 3.0*  CL 91* 92* 89*  CO2 29 29 33*  BUN '9 10 11  '$ CREATININE 0.51 0.54 0.47   Liver Panel No results for input(s): "PROT", "ALBUMIN", "AST", "ALT", "ALKPHOS", "BILITOT", "BILIDIR", "IBILI" in the last 72 hours. Sedimentation Rate No results for input(s): "ESRSEDRATE" in the last 72 hours. C-Reactive Protein No results for input(s): "CRP" in the last 72 hours.  Microbiology: reviewed Studies/Results: No results found.   Assessment/Plan: Pneumonia = continue on linezolid and ceftriaxone. Last day of ceftriaxone. Will check ur legionella  MRSA deep tissue infection = continue on linezolid for now  Goals of care = discussion with palliative care  [facility] Pager: [phone]  10/21/2022, 4:20 PM

## 2022-10-21 NOTE — Patient Care Conference (Signed)
Inpatient RehabilitationTeam Conference and Plan of Care Update Date: 10/21/2022   Time: 11:31 AM    Patient Name: Anna Weiss      Medical Record Number: [id number]  Date of Birth: February 19, 1925 Sex: Female         Room/Bed: 4M07C/4M07C-01 Payor Info: Payor: MEDICARE / Plan: MEDICARE PART A AND B / Product Type: *No Product type* /    Admit Date/Time:  09/28/2022  4:56 PM  Primary Diagnosis:  Fracture, intertrochanteric, left femur, sequela  Hospital Problems: Principal Problem:   Fracture, intertrochanteric, left femur, sequela Active Problems:   Drainage from wound    Expected Discharge Date: Expected Discharge Date:  (medical issues)  Team Members Present: Physician leading conference: Dr. Alger Simons Social Worker Present: Ovidio Kin, LCSW Nurse Present: Tacy Learn, RN PT Present: [name], PT OT Present: Jamey Ripa, OT PPS Coordinator present : Gunnar Fusi, SLP     Current Status/Progress Goal Weekly Team Focus  Bowel/Bladder   [place] foley catheter with bowel incontinence. LBM 10/20/22   Pt will be free from cauti and regain bowel incontinence   Assess for cauti and toilet Q2H qshift    Swallow/Nutrition/ Hydration               ADL's   pt has been on medical hold for last several days. OT did work with pt for 30 min this am for reassessment of BADL's and was Dep except for face washing, cup to mouth for 2 sips and simple lotion application to face. Otherwise pt lethargic, drwsy and only particiapeted when directly stimulated. All therapy was bed level and missed 15 minutes out of 45.   unclear dispo at this point   care coordination with medical and interdisciplinary team for plan    Mobility   min A bed mobility, transfers, gait x 5 ft on 11/22, pt unable to participate in PT since 11/22 due to medical issues   supervision  d/c planning, pt/fam education, transfers, gait, strength training    Communication                 Safety/Cognition/ Behavioral Observations               Pain   Pain well managed with current pain medications   Pt pain level will be <2   Assess for pain qshift/prn    Skin   Surgical incision to left hip   Skin will be free from infection with no further breakdown  Assess skin qshift/prn      Discharge Planning:  Pt feeling poorly and talking about dying-she is tired and feels not getting better. Palliative consult ordered for goals of care.   Team Discussion: Fracture, intertrochanteric, left femur, sequela. Foley. Incontinent of bowel. Pain managed with PRN medications if needed. Left hip incision has scant amount of drainage. Wound culture positive for MRSA. IV ABT continue. U/A sent with C&S pending. Patient has continued to decline over the last week. Therapies moved to QD. Has not been participating in therapy, refuses medications, patient verbalizing this am that wanted to stop breathing and didn't want to live. Palliative consulted.  Patient on target to meet rehab goals: No  *See Care Plan and progress notes for long and short-term goals.   Revisions to Treatment Plan:  Medication adjustments, therapy QD, Palliative consult  Teaching Needs: Medications, safety, skin/wound care, gait/transfer training, etc  Current Barriers to Discharge: Decreased caregiver support, Home enviroment access/layout, IV antibiotics, Incontinence, Wound care, Lack  of/limited family support, Weight bearing restrictions, and New oxygen  Possible Resolutions to Barriers: Family education, nursing education, palliative care     Medical Summary Current Status: fatigued, left femur fracture, MRSA deep tissue infection from left IMN, hospital acquired multifocal pneumonia, hypertension  Barriers to Discharge: Infection/IV Antibiotics;Medical stability  Barriers to Discharge Comments: fatigued, left femur fracture, MRSA deep tissue infection from left IMN, hospital acquired multifocal  pneumonia hypertension, hyponatremia Possible Resolutions to [employer] Focus: continue IV ceftriaxone, consulted infectious disease, continue hydralazine, nephrology consulted, HCTZ discontinued   Continued Need for Acute Rehabilitation Level of Care: The patient requires daily medical management by a physician with specialized training in physical medicine and rehabilitation for the following reasons: Direction of a multidisciplinary physical rehabilitation program to maximize functional independence : Yes Medical management of patient stability for increased activity during participation in an intensive rehabilitation regime.: Yes Analysis of laboratory values and/or radiology reports with any subsequent need for medication adjustment and/or medical intervention. : Yes   I attest that I was present, lead the team conference, and concur with the assessment and plan of the team.   Ernest Pine 10/21/2022, 3:20 PM

## 2022-10-21 NOTE — Progress Notes (Signed)
[facility] Progress Note  Name: Anna Weiss MRN: [id number] DOB: 09-04-1925  Chief Complaint:  Fall at home  Subjective:  She got lasix 60 mg IV once yesterday.  She had 2.2 liters UOP over 11/27.  Spoke with her daughter at bedside.  She states her mother's legs are always a little swollen, for reference.  She's down to 0.5 liter oxygen   Review of systems:  She states she is short of breath - feels a little better though  Previously nausea - none today  -----------------  Background on consult:  Anna Weiss is a 88 y.o. female with a history of HTN, CVA, and HLD who presented to the hospital after a fall at home on 09/24/22.  She sustained a femur fracture and is s/p repair on 09/25/22.  She also has concern for PNA but this was later felt to be atelectasis.  She was transferred to [facility] inpatient rehab for deconditioning.  Nephrology is consulted for assistance with management of hyponatremia.  She was previously on HCTZ which has been stopped with last dose on 11/25.  She got lasix on 11/21 but hasn't since.  She has been on IV fluids for several days - stopped on 11/26 per charting.  She states that her breathing has been "hard".  She has been on low rate of oxygen here but not normally on oxygen at home.  She feels swollen.      Intake/Output Summary (Last 24 hours) at 10/21/2022 1735 Last data filed at 10/21/2022 1300 Gross per 24 hour  Intake 360 ml  Output 2150 ml  Net -1790 ml    Vitals:  Vitals:   10/21/22 0131 10/21/22 0436 10/21/22 0500 10/21/22 1455  BP: (!) 132/53 (!) 144/53  (!) 149/53  Pulse: 80 78  66  Resp: '16 18  16  '$ Temp: 97.6 F (36.4 C) 98.5 F (36.9 C)  97.8 F (36.6 C)  TempSrc:      SpO2: 91% 94%  90%  Weight:   71.4 kg   Height:         Physical Exam:  General:  elderly female in bed, weak   HEENT: NCAT Eyes: EOMI sclera anicteric Neck: supple trachea midline  Heart: S1S2 no rub Lungs: clear and reduced; unlabored on 0.5  liter oxygen  Abdomen: soft/nt/nd Extremities: 2+ edema lower extremities  Skin: no rash on extremities exposed  Psych no anxiety or agitation  Neuro: awake and answers basic questions on exam  Medications reviewed   Labs:     Latest Ref Rng & Units 10/21/2022    3:50 AM 10/20/2022    4:30 AM 10/19/2022    4:19 AM  BMP  Glucose 70 - 99 mg/dL 131  123  126   BUN 8 - 23 mg/dL '11  10  9   '$ Creatinine 0.44 - 1.00 mg/dL 0.47  0.54  0.51   Sodium 135 - 145 mmol/L 130  128  129   Potassium 3.5 - 5.1 mmol/L 3.0  3.5  3.4   Chloride 98 - 111 mmol/L 89  92  91   CO2 22 - 32 mmol/L 33  29  29   Calcium 8.9 - 10.3 mg/dL 8.6  8.7  8.9      Assessment/Plan:   # Hyponatremia - Setting of HCTZ use as well as fluids.  On levothyroxine with acceptable dose  - Improving  - BMP in AM  - lasix 60 mg IV once   -  Please do not resume HCTZ (if a diuretic is needed she would need lasix)  # HTN  - improving  - Please do not resume HCTZ    # Hypokalemia  - she is on daily potassium  - additional potassium 20 meq PO once now  # Hypothyroidism  - on levothyroxine  - acceptable TSH on last check    # Macrocytic anemia - Improved overall; b12 and folate ok - She is iron deficient  - start oral iron supplement   # Fall with femur fracture - s/p repair per ortho - appreciate rehab   # Deconditioning - per inpatient rehab team  Claudia Desanctis, MD 10/21/2022 5:51 PM

## 2022-10-22 LAB — BASIC METABOLIC PANEL
Anion gap: 8 (ref 5–15)
BUN: 10 mg/dL (ref 8–23)
CO2: 34 mmol/L — ABNORMAL HIGH (ref 22–32)
Calcium: 8.4 mg/dL — ABNORMAL LOW (ref 8.9–10.3)
Chloride: 88 mmol/L — ABNORMAL LOW (ref 98–111)
Creatinine, Ser: 0.48 mg/dL (ref 0.44–1.00)
GFR, Estimated: >60 mL/min (ref >60)
Glucose, Bld: 110 mg/dL — ABNORMAL HIGH (ref 70–99)
Potassium: 3.1 mmol/L — ABNORMAL LOW (ref 3.5–5.1)
Sodium: 130 mmol/L — ABNORMAL LOW (ref 135–145)

## 2022-10-22 LAB — MINIMUM INHIBITORY CONC. (1 DRUG)

## 2022-10-22 LAB — MIC RESULT

## 2022-10-22 LAB — AEROBIC/ANAEROBIC CULTURE W GRAM STAIN (SURGICAL/DEEP WOUND)

## 2022-10-22 MED ORDER — FUROSEMIDE 10 MG/ML IJ SOLN
80.0000 mg | Freq: Once | INTRAMUSCULAR | Status: AC
  Administered 2022-10-22: 80 mg via INTRAVENOUS
  Filled 2022-10-22: qty 8

## 2022-10-22 MED ORDER — POTASSIUM CHLORIDE 20 MEQ PO PACK
40.0000 meq | PACK | Freq: Once | ORAL | Status: AC
  Administered 2022-10-22: 40 meq via ORAL
  Filled 2022-10-22: qty 2

## 2022-10-22 MED ORDER — POTASSIUM CHLORIDE 20 MEQ PO PACK
40.0000 meq | PACK | Freq: Two times a day (BID) | ORAL | Status: DC
  Administered 2022-10-22: 40 meq via ORAL

## 2022-10-22 NOTE — NC FL2 (Signed)
[place] MEDICAID FL2 LEVEL OF CARE SCREENING TOOL     IDENTIFICATION  Patient Name: Anna Weiss Birthdate: 09/10/1925 Sex: female Admission Date (Current Location): 09/28/2022  [facility] and [place] Number:  Herbalist and Address:  [facility], [place] [address] [place], [place] [address]      Provider Number: [id number]  Attending Physician Name and Address:  Courtney Heys, MD  Relative Name and Phone Number:  [name] Ritter-daughter [phone]    Current Level of Care: Other (Comment) (rehab) Recommended Level of Care: [place] Prior Approval Number:    Date Approved/Denied:   PASRR Number: [id number]  Discharge Plan: SNF    Current Diagnoses: Patient Active Problem List   Diagnosis Date Noted   Drainage from wound 10/10/2022   Fracture, intertrochanteric, left femur, sequela 09/28/2022   Sinus bradycardia 09/25/2022   Left lower lobe consolidation ([place]) 09/25/2022   Essential hypertension 09/25/2022   Left parietal scalp hematoma, initial encounter 09/25/2022   Displaced intertrochanteric fracture of left femur, initial encounter for closed fracture ([place]) 09/24/2022   Hip fracture ([place]) 09/24/2022   Rib fracture 04/08/2022   Lumbar compression fracture ([place]) 04/08/2022   Pain in right lower leg 05/14/2018    Orientation RESPIRATION BLADDER Height & Weight     Self, Time, Situation, Place  O2 (one liter continuous) Incontinent Weight: 156 lb 1.4 oz (70.8 kg) Height:  '5\' 7"'$  (170.2 cm)  BEHAVIORAL SYMPTOMS/MOOD NEUROLOGICAL BOWEL NUTRITION STATUS      Continent Diet (regular thin liquids)  AMBULATORY STATUS COMMUNICATION OF NEEDS Skin   Extensive Assist Verbally Skin abrasions, Surgical wounds (MASD on bottom)                       Personal Care Assistance Level of Assistance  Bathing, Dressing Bathing Assistance: Limited assistance   Dressing Assistance: Limited assistance     Functional  Limitations Info  Sight, Hearing, Speech Sight Info: Adequate Hearing Info: Adequate Speech Info: Adequate    SPECIAL CARE FACTORS FREQUENCY  PT (By licensed PT), OT (By licensed OT)     PT Frequency: 5x week OT Frequency: 5x week            Contractures Contractures Info: Not present    Additional Factors Info  Code Status, Allergies, Isolation Precautions Code Status Info: DNR Allergies Info: Aspirin     Isolation Precautions Info: MRSA     Current Medications (10/22/2022):  This is the current hospital active medication list Current Facility-Administered Medications  Medication Dose Route Frequency Provider Last Rate Last Admin   0.9 %  sodium chloride infusion   Intravenous PRN Jennye Boroughs, MD   Stopped at 10/16/22 2330   acetaminophen (TYLENOL) tablet 650 mg  650 mg Oral Q6H PRN Callie Fielding, MD   650 mg at 10/14/22 2112   albuterol (PROVENTIL) (2.5 MG/3ML) 0.083% nebulizer solution 2.5 mg  2.5 mg Nebulization Q4H PRN Lovorn, Megan, MD   2.5 mg at 10/20/22 0738   alum & mag hydroxide-simeth (MAALOX/MYLANTA) 200-200-20 MG/5ML suspension 30 mL  30 mL Oral Q4H PRN Callie Fielding, MD       antiseptic oral rinse (BIOTENE) solution 15 mL  15 mL Mouth Rinse PRN Callie Fielding, MD   15 mL at 09/30/22 2322   bisacodyl (DULCOLAX) suppository 10 mg  10 mg Rectal Daily PRN Callie Fielding, MD       Chlorhexidine Gluconate Cloth 2 % PADS  6 each  6 each Topical BID Courtney Heys, MD   6 each at 10/22/22 0454   clopidogrel (PLAVIX) tablet 75 mg  75 mg Oral Daily Callie Fielding, MD   75 mg at 10/22/22 0939   feeding supplement (ENSURE ENLIVE / ENSURE PLUS) liquid 237 mL  237 mL Oral BID BM Callie Fielding, MD   237 mL at 10/22/22 0940   hydrALAZINE (APRESOLINE) tablet 25 mg  25 mg Oral Q8H Callie Fielding, MD   25 mg at 10/22/22 0543   iron polysaccharides (NIFEREX) capsule 150 mg  150 mg Oral Daily Claudia Desanctis, MD   150 mg at 10/22/22 3009   levothyroxine  (SYNTHROID) tablet 50 mcg  50 mcg Oral Q0600 Callie Fielding, MD   50 mcg at 10/22/22 0543   lidocaine (XYLOCAINE) 2 % jelly 1 Application  1 Application Urethral PRN Callie Fielding, MD       linezolid (ZYVOX) tablet 600 mg  600 mg Oral Q12H Carlyle Basques, MD   600 mg at 10/22/22 2330   lip balm (CARMEX) ointment   Topical PRN Donnamae Jude, RPH       magic mouthwash  10 mL Oral QID Callie Fielding, MD   10 mL at 10/22/22 0939   morphine CONCENTRATE 10 MG/0.5ML oral solution 2.6 mg  2.6 mg Sublingual QHS Vinie Sill C, NP   2.6 mg at 10/21/22 2130   morphine CONCENTRATE 10 MG/0.5ML oral solution 2.6 mg  2.6 mg Sublingual Q7M PRN Pershing Proud, NP       Oral care mouth rinse  15 mL Mouth Rinse PRN Lovorn, Megan, MD   15 mL at 10/19/22 2030   pantoprazole (PROTONIX) EC tablet 40 mg  40 mg Oral BID Callie Fielding, MD   40 mg at 10/22/22 2263   polyvinyl alcohol (LIQUIFILM TEARS) 1.4 % ophthalmic solution 1 drop  1 drop Both Eyes BID Callie Fielding, MD   1 drop at 10/21/22 2137   potassium chloride (KLOR-CON) packet 40 mEq  40 mEq Oral BID Izora Ribas, MD   40 mEq at 10/22/22 0939   sodium chloride flush (NS) 0.9 % injection 10-40 mL  10-40 mL Intracatheter PRN Lovorn, Jinny Blossom, MD   10 mL at 10/22/22 0353   sorbitol 70 % solution 30 mL  30 mL Oral Daily PRN Callie Fielding, MD       tamsulosin [facility]) capsule 0.8 mg  0.8 mg Oral QPC supper Lovorn, Jinny Blossom, MD   0.8 mg at 10/20/22 1724     Discharge Medications: Please see discharge summary for a list of discharge medications.  Relevant Imaging Results:  Relevant Lab Results:   Additional Information SSN: [id number] IV antibiotics have finished  Bret Stamour, Gardiner Rhyme, LCSW

## 2022-10-22 NOTE — Progress Notes (Signed)
Occupational Therapy Session Note  Patient Details  Name: Anna Weiss MRN: [id number] Date of Birth: [birth year]  Today's Date: 10/22/2022 OT Individual Time: 6761-9509 OT Individual Time Calculation (min): 15 min     10/22/22 1226  OT Individual Time Calculation  OT Individual Start Time 0915  OT Individual Stop Time 0930  OT Individual Time Calculation (min) 15 min  OT Missed Time  OT Amount of Missed Time 15 Minutes  OT Missed Time Reason Patient ill (comment) (only able to participate 15 min due to ongoing lethargy, SOB and medical instability)   Short Term Goals: Week 3:  OT Short Term Goal 1 (Week 3): Pt will transfer bed to DABSC with mod A OT Short Term Goal 2 (Week 3): Pt will bathe sink side with min A using AE OT Short Term Goal 3 (Week 3): Pt will stand for 1 minute for simple ADL's with support with min A  Skilled Therapeutic Interventions/Progress Updates:   Care coordination with [facility] nurse who requested OT input on positioning due to sacral discomfort and breakdown. OT spoke with Neoma Laming nurse mngr and Delia Chimes as they are ordering an air mattress. Pt repositioned to "float" and off weight sacrum and coccyx. Pt moved fully upright in bed and able top self feed 4 small bites of banana, muffin and 5 sips of water. Left pt in the care of nursing for meds administration. Missed 15 minutes due to lethargy.   Therapy Documentation Precautions:  Precautions Precautions: Fall, Other (comment), contact Precaution Comments: monitor O2, orthostatics Restrictions Weight Bearing Restrictions: Yes LLE Weight Bearing: Weight bearing as tolerated    Therapy/Group: Individual Therapy  [name] 10/22/2022, 7:55 AM

## 2022-10-22 NOTE — Plan of Care (Signed)
Wound Plan   Braden Score: 2   Sensory: 3   Moisture: 3  Activity: 1  Mobility: 2  Nutrition: 2  Friction: 1   Wounds present:   11/23 Pressure Injury Stage II Ischial Tuberosity 11/23 Pressure Injury Stage II Right MASD  Interventions:   Air Mattress Ensure Supplement Foley Catheter Palliative Consult Dressing changes per MD order for Left hip  Contributors:  Tacy Learn, RN Vernona Rieger Lennnon PT Jamey Ripa, OT

## 2022-10-22 NOTE — Progress Notes (Signed)
PROGRESS NOTE   Subjective/Complaints: Sleepy this morning Continues to be hypokalemic: supplement increased to 47mq BID Appreciate nephrology following   ROS: Limited by cognition, but more able to answer questions. +fatigue, +chronic lower extremity edema as per daughter   Objective:   No results found. Recent Labs    10/20/22 0430  WBC 9.7  HGB 9.5*  HCT 28.8*  PLT 301    Recent Labs    10/21/22 0350 10/22/22 0301  NA 130* 130*  K 3.0* 3.1*  CL 89* 88*  CO2 33* 34*  GLUCOSE 131* 110*  BUN 11 10  CREATININE 0.47 0.48  CALCIUM 8.6* 8.4*    Intake/Output Summary (Last 24 hours) at 10/22/2022 1006 Last data filed at 10/22/2022 0800 Gross per 24 hour  Intake 100 ml  Output 2250 ml  Net -2150 ml     Pressure Injury 10/16/22 Ischial tuberosity Right Stage 2 -  Partial thickness loss of dermis presenting as a shallow open injury with a red, pink wound bed without slough. (Active)  10/16/22 0900  Location: Ischial tuberosity  Location Orientation: Right  Staging: Stage 2 -  Partial thickness loss of dermis presenting as a shallow open injury with a red, pink wound bed without slough.  Wound Description (Comments):   Present on Admission: No     Pressure Injury 10/16/22 Right Stage 2 -  Partial thickness loss of dermis presenting as a shallow open injury with a red, pink wound bed without slough. (Active)  10/16/22 0900  Location:   Location Orientation: Right  Staging: Stage 2 -  Partial thickness loss of dermis presenting as a shallow open injury with a red, pink wound bed without slough.  Wound Description (Comments):   Present on Admission: No    Physical Exam: Vital Signs Blood pressure (!) 157/50, pulse (!) 58, temperature 98.2 F (36.8 C), resp. rate 19, height _0  (1.702 m), weight 70.8 kg, SpO2 96 %. General: awake, alert, but very sleepy; supine in bed; on 0.5L O2, more color in her  face; NAD, fatigued HENT: conjugate gaze; oropharynx dry- helped her drink CV: bradycardia; no JVD Pulmonary:decreased on R lung in general and a few rhonchi heard on L side GI: soft, NT, ND, (+)BS Psychiatric: somewhat more interactive Extremities: 1+ LE edema B/L  Neurological: more alert, more responsive and more interactive, but still NOT baseline Musculoskeletal:     Cervical back: Normal range of motion.     Comments: Nontender on palpation of bilateral hips, thighs on exam Skin: L hip less swelling; dressing C/D/I    General: Skin is warm.     Findings: Bruising (arms) present. No signs of ecchymosis or diffuse bruising.     Comments: Left hip incision CDI, dressing dry  Neurological: confused, nonverbal but able to follow 2/3 simple commands in all 4 extremities.    Cranial Nerves: No cranial nerve deficit.     Comments: Motor: 4-5/5 Ue's. Sitting upright at EOB with Mod A.   Assessment/Plan: 1. Functional deficits which require 3+ hours per day of interdisciplinary therapy in a comprehensive inpatient rehab setting. Physiatrist is providing close team supervision and 24 hour management of active medical  problems listed below. Physiatrist and rehab team continue to assess barriers to discharge/monitor patient progress toward functional and medical goals  Care Tool:  Bathing    Body parts bathed by patient: Face, Abdomen, Chest   Body parts bathed by helper: Right arm, Left arm, Front perineal area, Buttocks, Right upper leg, Left upper leg, Right lower leg, Left lower leg     Bathing assist Assist Level: Maximal Assistance - Patient 24 - 49%     Upper Body Dressing/Undressing Upper body dressing   What is the patient wearing?: Hospital gown only    Upper body assist Assist Level: Maximal Assistance - Patient 25 - 49%    Lower Body Dressing/Undressing Lower body dressing      What is the patient wearing?: Hospital gown only     Lower body assist Assist for  lower body dressing: Maximal Assistance - Patient 25 - 49%     Toileting Toileting    Toileting assist Assist for toileting: Dependent - Patient 0%     Transfers Chair/bed transfer  Transfers assist  Chair/bed transfer activity did not occur: Safety/medical concerns (dizziness, pain, decreased balance)  Chair/bed transfer assist level: Minimal Assistance - Patient > 75%     Locomotion Ambulation   Ambulation assist   Ambulation activity did not occur: Safety/medical concerns (dizziness, pain, decreased balance)  Assist level: Minimal Assistance - Patient > 75% Assistive device: Walker-rolling Max distance: 38   Walk 10 feet activity   Assist  Walk 10 feet activity did not occur: Safety/medical concerns (dizziness, pain, decreased balance)  Assist level: Minimal Assistance - Patient > 75% Assistive device: Walker-rolling   Walk 50 feet activity   Assist Walk 50 feet with 2 turns activity did not occur: Safety/medical concerns         Walk 150 feet activity   Assist Walk 150 feet activity did not occur: Safety/medical concerns         Walk 10 feet on uneven surface  activity   Assist Walk 10 feet on uneven surfaces activity did not occur: Safety/medical concerns (dizziness, pain, decreased balance)         Wheelchair     Assist Is the patient using a wheelchair?: Yes Type of Wheelchair: Manual Wheelchair activity did not occur: Safety/medical concerns (dizziness, pain, decreased balance)         Wheelchair 50 feet with 2 turns activity    Assist    Wheelchair 50 feet with 2 turns activity did not occur: Safety/medical concerns (dizziness, pain, decreased balance)       Wheelchair 150 feet activity     Assist  Wheelchair 150 feet activity did not occur: Safety/medical concerns (dizziness, pain, decreased balance)       Blood pressure (!) 157/50, pulse (!) 58, temperature 98.2 F (36.8 C), resp. rate 19, height _0   (1.702 m), weight 70.8 kg, SpO2 96 %.  Medical Problem List and Plan: 1. Functional deficits secondary to left intertrochanteric femur fx after fall. Pt s/Anna IMN 09/25/22             -LLE WBAT             -patient may shower             -ELOS/Goals: 15-20 days  Continue CIR- PT and OT  Decrease to QD 2.  Hematuria: d/c Eliquis.Resumed plavix 54m daily 3. Pain: continue SL morphine PRN for pain. tylenol and hydrocodone prn for pain             -  robaxin prn for muscle spasms  11/8- pain limiting therapy- doesn't want opiates- mainly using tylenol-   11/9- d/w pt- she's willing to try tramadol 50 mg q6 hours prn- she's scared of Coding if takes Norco- I explained that Tramadol is mild and shouldn't cause any cognitive issues.   11/10- pain much better- not resolved, but better with tramadol- con't tramadol and tylenol prn  11/14- pain still doing better with tramadol  11/17- is worse today since had surgery yesterday-doesn't want any other meds  11/18 reports pain controlled, continue tramadol PRN  - 11/25 Dced sedating medications, no current pain.   4. Mood/Behavior/Sleep: team to provide ego support             -antipsychotic agents: n/a 5. Neuropsych/cognition: This patient is capable of making decisions on her own behalf. 6. Skin/Wound Care: dry dressings to left ear and hip  11/9- having some drainage- from L hip- is improved this Am- will monitor closely.  7. Fluids/Electrolytes/Nutrition: encourage appropriate PO             -pt's intake has inconsistent so far  - Improved 100-35% 11/25  11/27- was told eating/drinking a little more.  8. Nausea:              -might be positional or med related.              -also having associated gas             -pt told me she's been moving bowels, however I don't see a bm recorded             -will try sorbitol today, dulcolax suppository prn             -protonix 11/8- has ileus- per #15 below  11/9- still having nausea- will change diet  to clear liquid per pt request- and monitor  11/10- on clear liquid diet- pt wants to wait 1 more day before changing to regular diet- if not better tomorrow, will need to call GI  11/14- nausea resolved- back on regular diet  11/16- nauseated again  111/7- Put on IVFs for another 24 hours at 75cc/hour since had surgery at 10-11pm at night and didn't get back from PACU until 1am- sh'e sleepy and not taking in much  11/18 LBM today, improved continue to monitor 11/20- LBM last night  11.21- checking KUB and ordered clear liquid diet- also put on IVFs, but holding until CXR back- since not drinking a lot  11/22- will stop IVFs- is drinking enough per labs- pt want sot wait to go back to regular until tomorrow  11/23- back to regular diet- with 1500cc fluid restriction  11/24- placed IVFs x 2 4 hours and recheck in AM  11/25 -NA, BUN, Cr stable on 75 cc/hr IVF; Dced HCTZ as would complicate further studies; recheck BMP in AM  11/27- Na 128- per hyponatremia 9. Left lower lobe consolidation             -pneumonia ruled out. Likely atelectasis             -IS, OOB             -wean oxygen to off  11/7- will write order to wean O2.   11/27- O2 0.5-1L O2 due to pneumonia 10. Sinus bradycardia:             -on propranolol and verapamil at home             -  continue to hold as HR in 50's  11/14- since still in 50s, will not restart  11. Essential HTN             -continue hctz  11/14- BP usually controlled but up today- will monitor for trend  11/15- somewhat elevated, but not sure if dropping- see #21           11/16- started hydralazine  11/17- just got hydralazine- is slightly better- might need it increased over weekend  -11/19 good control overall continue to monitor  11/20- somewhat variable- due to ABLA likely  11/22- doing better  11/25 - DC HCTZ d/t IVF, hyponatremia as above - unable to give PO antihypertensives QHS Vitals:   10/21/22 2000 10/22/22 0442  BP: (!) 152/69 (!) 157/50   Pulse: 71 (!) 58  Resp: 16 19  Temp: 97.8 F (36.6 C) 98.2 F (36.8 C)  SpO2: 91% 96%    12. Right middle lobe nodule 2.3 cm- should be considered primary lung CA until ruled out- also has nodule as well 94m on R middle lung- could be mets per CT scan.              -f/u as outpt  11/7- d/w daughter- she said no f/u at this time- due to pt's age.   11/8- daughter doesn't want to let pt know about tumors.    13. Mild leukocytosis- 11.2   11/6- afebrile- other than difficulty swallowing- says it's dry?; denies UTI Sx's- if gets worse clinically, will recheck in AM- right now scheduled for Wed AM.   11/8- WBC down to 9.4-  14. Constipation  11/6- LBM on day of fall per pt and chart- will give Sorbitol after therapy and follow with SSE if needed.   11/7- had multiple BM's- didn't need SSE- per notes- feels cleaned out   11/19 LBM today, improved  11/27- LBM last night 15. ABLA  11/6- Down to 7.6- will recheck Wednesday to make sure not dropping further- and transfuse if required.   11/8- Hb 7.3- on edge of needing transfusion.   11/9- Hb up to 8.0-   11/10- Hb 7.7- will con't to monitor closely.   11/15- Hb 8.4  11/20- Hb 6.4- will transfuse as below  11/25 - HgB stable ~9, however with ongoing gross hematuria; holding PM eliqius, continue plavix  16. Post op ileus resolved BM x 3 11/10, 1 BM 11/12- advance diet as tolerated   -Recheck level tomorrow  11/21- New n/V-- ordered another KUB to look for ileus and put back on clear liquid diet- also ordered IV compazine, PO/PR phenergan and d/c'd Zofran since can cause constipation  11/22- wants ot wait til tomorrow to go back to regular diet- but can be changed anytime pt wants to back to regular diet  11/27- was back to regular diet- eating slightly better 17. Thrush?  11/9- will add Magic mouthwash- don't see plaques, but tongue dry and furrowed. Says things taste 'wierd".   11/10- said magic mouthwash helping Sx's.  18. Leukocytosis  improved   11/15- WBC down to 7.8  11/16- will recheck today to make sure not elevated  11/17- WBC stable at 8k  Recheck tomorrow  11/20- WBC down to 6.8  11/23- CRP 7.6- and ESR 21- doing great  11/25 - inc 13 from 10; allow 24 hours on new abx regimen; recheck in AM. Ucx NGTD, likely etiologies PNA vs. Ongoing L hip infection - both should be covered on daptomycin  and rocephin  11/27- WBC back down to 9k- - ID also helping today due to questionable worsening pneumonia on CXR    Latest Ref Rng & Units 10/20/2022    4:30 AM 10/19/2022    4:19 AM 10/18/2022    3:24 AM  CBC  WBC 4.0 - 10.5 K/uL 9.7  11.6  13.6   Hemoglobin 12.0 - 15.0 g/dL 9.5  9.4  9.4   Hematocrit 36.0 - 46.0 % 28.8  27.2  27.9   Platelets 150 - 400 K/uL 301  296  298     19. Hypokalemia and low Mg  11/10- repleted up to 3.5- will put on Kcl 10 mEq daily for now and recheck Monday- since K+ still  3.5- will also check Mg Monday as well   11/14= Mg 1.6 and K+ 3.1- repleted again  yesterday by pharmacy for [place]- will recheck labs iN AM  11/15- K+ 4.2- will give IV Mg 2G for Mg 1.6- and recheck in AM  11/16- Mg 1.7- still low- will put on PO Mg 400 mg BID  Recheck K+ and MG tomorrow  11/20- K+ 3.8 and Mg 1.8- will con't regimen  11/22- K+ 3.4- repleted again IV- repleted yesterday IV since was 3.0-   11/23- KCL- will replete since K+ 3.2 today- will give 40 mEq x2 and recheck in AM- will also change daily KCL to 20 mEq- Mg level 1.7  11/24- K+ 3.6  11/26 - K 3.8; monitor  20. Swallowing issues  11/14- appears to be thrush- will start Diflucan- due to renal issues, will need to be 50 mg daily- x 7 days doses-   11/15- reports things taste better and was able ot swallow some this AM of breakfast-con't diflucan  11/19 reports improved, continue to monitor  11/23- resolved- Diflucan finished  21. Dizziness/lightheadedness/presyncope  11/15- will have pt get orthostatics checked by nursing and therapy- BP on high side,  so not sure if Sx's due to BP being high or dropping.   11/16- added hydralazine 25 mg TID- is somewhat better today  11/20- BP a little on low side- likely due to ABLA- will hold BP meds if need be.  22. Infection of L hip?  11/16- still having drainage from L hip- spoke to Ortho- they are taking her to OR today at 5pm for wash out-   11/17- Gram stains negative so far- waiting for Cx's- surgeon gave Ancef x2 IV and will monitor Cultures  11/19 wound culture growing staph,Continue vancomycin, surgery advising ID consult, called ID to discuss -consult placed  11/20- con't IV Vanc per ID- Cx's still pending final. 11/21- changed to Daptomycin, per ID- will double check labs today- CK is pending- didn't get done yesterday- will check labs today as well  11/22- CK looking good- 23- con't Daptomycin- stop date is 11/07/22 per ID  23. Urinary retention  11/16- is new- will check U/A and Cx- and labs- CBC- with diff and CMP just to make sure no other issues  11/17- U/A (-) and doesn't have elevated WBC- mild left shift however- will start Flomax 0.4 mg q supper  11/8 continent PVRs a little improved today< 372m, continue to monitor, continue flomax, IC if > 350 ml  11/23- placed foley since having such high volumes and will leave in til early next week- will try voiding trial Monday/Tuesday- will also increase Flomax to 0.8 q supper  11/24- will treat possible UTI- and possible pneumonia- WBC up slightly to  10.6 with L shift  11/25: See above; ongoing hematuria w/o growth on Ucx; ? UTI  24. ABLA anemia-  11/20- down to 6.4- will transfuse 2 units since so low.    11/21- labs today- pending  11/22- Hb 9.7  11/25: Held QHS eliquis 2.5 for gross hematuria; cbc in AM  25. SOB/questionable pneumonia/bibasilar opacities  11/21- pt c/o SOB- O2 sats 98% on 2L- has been on O2 since surgery last week- will check CXR and gave IV Lasix 40 mg x1 - and let pt know we need her to void. If CXR Negative, will  need CTA of chest to look for PE.   11/22- down to 0.5 L of O2- and satting well- actually CO2 was too high on 2L- so reduced due to CO2 retention- SOB improved- of note, no leukocytosis  11/23- off O2 completely- and satting well  11/24- more SOB- still has Bibasilar opacities- hadn't treated earlier because lack of elevated WBC and afebrile- will treat now- also treat possible UTI per IM with Rocephin 1G q24 hours and wait for U Cx.   - 11/25: See above; if WBC continues uptrend in AM despite broad IV abx and IVF, will get Bcx 2 and re-engage IM, ID for infectious workup, hyponatremia, and hematuria  11/27- WBC down to 9.7, however CXR looks worse- on Rocephin- have asked ID to come take a look at pt- they will come by today.   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 Ceftriaxone completed for pneumonia treatment 26. 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  27. Bradycardia: continue to monitor HR TID  LOS: 24 days A FACE TO FACE EVALUATION WAS PERFORMED  Anna Weiss Anna Weiss 10/22/2022, 10:07 AM

## 2022-10-22 NOTE — Progress Notes (Signed)
Ordered air mattress for patient.

## 2022-10-22 NOTE — Progress Notes (Signed)
Palliative:  HPI: 88 y.o. female  with past medical history of hypertension, stroke 2003. Hospitalized 09/24/22-09/28/22 due to fall 09/24/22 with resultant parietal scalp hematoma, remote lacunar infarcts, left comminuted intertrochanteric fracture s/p surgical repair 09/25/22 and hospitalization complicated by pneumonia vs atelectasis and RML nodule found incidentally. She was admitted on 09/28/2022 to CIR. Required surgical irrigation and debridement for left hip infection 10/09/22 . 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 Overall failure to thrive given prolonged acute illness with multiple complications. Palliative care requested to address goals of care.    I returned to Anna Weiss's bedside. She is sleeping so I initially spoke with daughter, Anna Weiss. RN/NT report that she ate a little breakfast and has been awake and talking with them this morning. They report that she took her morning medications. Anna Weiss expressed that she does not feel that her mother is ready "to give up" but was just having a bad day yesterday. We discussed that this is a possibility and that is why we decided to revisit today and see if her wishes are consistent and what she really wants. Anna Weiss shares that she knows that her mother will not return to her previous baseline but is hoping that she can have some level of improvement from her current state.   Anna Weiss awoke and reports that she slept better last night and her breathing feels better. I asked her if she is still hanging in there and she replies "barely." She indicates today that she wishes to continue with antibiotics and treatment. She does not speak of suffering and wanting to "stop breathing" as she did yesterday. Anna Weiss reminds her that she is "captain of the  ship" and we are all there to help her the best we are able. Anna Weiss agrees with maintaining current care today. I encourage that if she has any questions or concerns that Anna Weiss and the nursing staff know how to reach me. Son, Anna Weiss, is en-route for a visit and should be here this afternoon. I notified Anna Weiss that I will not call him as planned if he is driving and to just call me if he has any questions when he arrives. We discussed that there are no plans for change in plan of care today based on discussion with her mother.   When I left Anna Weiss is feeding Anna Weiss. Anna Weiss ate a few spoonfuls of broth and then wanted no more. I gave her some cranberry juice. She ate ~half a banana and ~half a small muffin this morning. I encouraged Anna Weiss to try to eat more if she wants to have more energy. She is not a fan of any of the supplements. Her son will bring her a [employer] this afternoon.   All questions/concerns addressed. Emotional support provided.   Exam: Alert, oriented. Extremely weak and fatigued. Poor reserve. Breathing appears improved. No distress. Abd flat.   Plan: - Goals at this time are to continue current care. - Continue roxanol qhs for now as discussed with patient and daughter.  - I still worry that overall prognosis is poor and she is high risk for further decompensation. I will follow for continued support to patient and family.   [place], NP Palliative Medicine Team Pager [phone] (Please see [email or web address] for schedule) Team Phone [phone]  Greater than 50%  of this time was spent counseling and coordinating care related to the above assessment and plan

## 2022-10-22 NOTE — Progress Notes (Addendum)
Patient ID: Anna Weiss, female   DOB: [birth year], 88 y.o.   MRN: [id number]  Met with daughter who has met with palliative care and feels her Mom was having day yesterday not wanting to take her meds. Informed her she is telling staff she is tired and is ready to be done and this is one of the reason's palliative called ed. She does not feel she could take her home with hospice and feels she will need to go to a SNF. Discussed will need to make sure she is medically stable to transfer to one. Daughter wants to talk with MD and have asked MD to speak with her she wants to know long term. Aware of the concern of Mom not eating and feeling like she is choking when she is sitting up in bed. Will work on best plan for her and await MD's discussion with daughter.  2:00 pm MD did speak with daughter who does not want hospice and wants Mom to go to [facility] if will accept her. Will reach out to [facility] to see if can offer her a bed. Will update FL2 and re-send out

## 2022-10-22 NOTE — Progress Notes (Signed)
[facility] Progress Note  Name: Anna Weiss MRN: [id number] DOB: [birth year]  Chief Complaint:  Fall at home  Subjective:  She got lasix 60 mg IV once again yesterday.  She had 2.3 liters UOP over 11/28.  Her daughter states that her mother's legs are always a little swollen, for reference; they do seem still above her baseline.   Review of systems:  Shortness of breath is improved  Hx nausea recently  Feels tired and weak- hard to do therapy   -----------------  Background on consult:  Anna Weiss is a 88 y.o. female with a history of HTN, CVA, and HLD who presented to the hospital after a fall at home on 09/24/22.  She sustained a femur fracture and is s/p repair on 09/25/22.  She also has concern for PNA but this was later felt to be atelectasis.  She was transferred to [facility] inpatient rehab for deconditioning.  Nephrology is consulted for assistance with management of hyponatremia.  She was previously on HCTZ which has been stopped with last dose on 11/25.  She got lasix on 11/21 but hasn't since.  She has been on IV fluids for several days - stopped on 11/26 per charting.  She states that her breathing has been "hard".  She has been on low rate of oxygen here but not normally on oxygen at home.  She feels swollen.      Intake/Output Summary (Last 24 hours) at 10/22/2022 1416 Last data filed at 10/22/2022 0800 Gross per 24 hour  Intake 40 ml  Output 2250 ml  Net -2210 ml    Vitals:  Vitals:   10/21/22 1455 10/21/22 2000 10/22/22 0442 10/22/22 0500  BP: (!) 149/53 (!) 152/69 (!) 157/50   Pulse: 66 71 (!) 58   Resp: '16 16 19   '$ Temp: 97.8 F (36.6 C) 97.8 F (36.6 C) 98.2 F (36.8 C)   TempSrc:  Oral    SpO2: 90% 91% 96%   Weight:    70.8 kg  Height:         Physical Exam:   General:  elderly female in bed, weak   HEENT: NCAT Eyes: EOMI sclera anicteric Neck: supple trachea midline  Heart: S1S2 no rub Lungs: clear and reduced; unlabored on 1 liter  oxygen  Abdomen: soft/nt/nd Extremities: 2+ edema lower extremities  Skin: no rash on extremities exposed  Psych no anxiety or agitation  Neuro: awake and oriented and answers basic questions on exam  Medications reviewed   Labs:     Latest Ref Rng & Units 10/22/2022    3:01 AM 10/21/2022    3:50 AM 10/20/2022    4:30 AM  BMP  Glucose 70 - 99 mg/dL 110  131  123   BUN 8 - 23 mg/dL '10  11  10   '$ Creatinine 0.44 - 1.00 mg/dL 0.48  0.47  0.54   Sodium 135 - 145 mmol/L 130  130  128   Potassium 3.5 - 5.1 mmol/L 3.1  3.0  3.5   Chloride 98 - 111 mmol/L 88  89  92   CO2 22 - 32 mmol/L 34  33  29   Calcium 8.9 - 10.3 mg/dL 8.4  8.6  8.7      Assessment/Plan:   # Hyponatremia - Setting of HCTZ use as well as fluids.  On levothyroxine with acceptable dose  - Improving  - BMP in AM (ordered daily x 5)  - lasix 80 mg IV  once    - Please do not resume HCTZ (if a diuretic is needed she would need lasix)  # HTN  - improving  - Please do not resume HCTZ    # Hypokalemia  - potassium 40 meq BID today  - BMP in AM   # Hypothyroidism  - on levothyroxine  - acceptable TSH on last check    # Macrocytic anemia - Improved overall; b12 and folate ok - She is iron deficient  - on oral iron supplement   # Fall with femur fracture - s/p repair per ortho - appreciate rehab   # Deconditioning - per inpatient rehab team  Claudia Desanctis, MD 10/22/2022 2:33 PM

## 2022-10-22 NOTE — Progress Notes (Signed)
Physical Therapy Session Note  Patient Details  Name: Anna Weiss MRN: [id number] Date of Birth: 15-Aug-1925  Today's Date: 10/22/2022 PT Missed Time: 29 Minutes Missed Time Reason: Patient ill (Comment) (pt declined PT)  Short Term Goals: Week 3:  PT Short Term Goal 2 (Week 3): STG=LTG due to extended stay due to medical issues  Skilled Therapeutic Interventions/Progress Updates:      Therapy Documentation Precautions:  Precautions Precautions: Fall, Other (comment) Precaution Comments: monitor O2, orthostatics Restrictions Weight Bearing Restrictions: Yes LLE Weight Bearing: Weight bearing as tolerated  Pt politely declined participation in PT session and missed 30 minutes of skilled physical therapy. PT will plan to make up missed minutes as able.     Therapy/Group: Individual Therapy  [name] [name] PT, DPT  10/22/2022, 7:40 AM

## 2022-10-23 LAB — BASIC METABOLIC PANEL
Anion gap: 10 (ref 5–15)
BUN: 12 mg/dL (ref 8–23)
CO2: 38 mmol/L — ABNORMAL HIGH (ref 22–32)
Calcium: 8.9 mg/dL (ref 8.9–10.3)
Chloride: 88 mmol/L — ABNORMAL LOW (ref 98–111)
Creatinine, Ser: 0.57 mg/dL (ref 0.44–1.00)
GFR, Estimated: >60 mL/min (ref >60)
Glucose, Bld: 106 mg/dL — ABNORMAL HIGH (ref 70–99)
Potassium: 3.1 mmol/L — ABNORMAL LOW (ref 3.5–5.1)
Sodium: 136 mmol/L (ref 135–145)

## 2022-10-23 MED ORDER — POTASSIUM CHLORIDE CRYS ER 20 MEQ PO TBCR
40.0000 meq | EXTENDED_RELEASE_TABLET | Freq: Two times a day (BID) | ORAL | Status: AC
  Administered 2022-10-23 – 2022-10-25 (×5): 40 meq via ORAL
  Filled 2022-10-23 (×5): qty 2

## 2022-10-23 MED ORDER — FUROSEMIDE 10 MG/ML IJ SOLN
80.0000 mg | Freq: Once | INTRAMUSCULAR | Status: AC
  Administered 2022-10-23: 80 mg via INTRAVENOUS
  Filled 2022-10-23: qty 8

## 2022-10-23 MED ORDER — POTASSIUM CHLORIDE CRYS ER 20 MEQ PO TBCR: 40.0000 meq | EXTENDED_RELEASE_TABLET | Freq: Once | ORAL | Status: DC

## 2022-10-23 MED ORDER — FUROSEMIDE 10 MG/ML IJ SOLN
80.0000 mg | Freq: Once | INTRAMUSCULAR | Status: AC
  Administered 2022-10-24: 80 mg via INTRAVENOUS
  Filled 2022-10-23: qty 8

## 2022-10-23 NOTE — Progress Notes (Signed)
[facility] Progress Note  Name: Anna Weiss MRN: [id number] DOB: 1925-01-22  Chief Complaint:  Fall at home  Subjective:  She had 1.9 liters UOP over 11/29.  Weight is up 7 kg and I do not feel is accurate.  Her daughter is at bedside and they are pleased with her progress - family has noticed a difference in her breathing.  Worked with therapy   Review of systems:  Shortness of breath is improved  No n/v Swelling is down  No chest pain  -----------------  Background on consult:  Anna Weiss is a 88 y.o. female with a history of HTN, CVA, and HLD who presented to the hospital after a fall at home on 09/24/22.  She sustained a femur fracture and is s/p repair on 09/25/22.  She also has concern for PNA but this was later felt to be atelectasis.  She was transferred to [facility] inpatient rehab for deconditioning.  Nephrology is consulted for assistance with management of hyponatremia.  She was previously on HCTZ which has been stopped with last dose on 11/25.  She got lasix on 11/21 but hasn't since.  She has been on IV fluids for several days - stopped on 11/26 per charting.  She states that her breathing has been "hard".  She has been on low rate of oxygen here but not normally on oxygen at home.  She feels swollen.      Intake/Output Summary (Last 24 hours) at 10/23/2022 1520 Last data filed at 10/23/2022 4034 Gross per 24 hour  Intake --  Output 1850 ml  Net -1850 ml    Vitals:  Vitals:   10/23/22 0625 10/23/22 0626 10/23/22 0822 10/23/22 1326  BP: (!) 175/56 (!) 175/57 (!) 133/48 (!) 133/57  Pulse:  (!) 58 65 61  Resp:  '18 16 16  '$ Temp:  (!) 97.5 F (36.4 C) 97.7 F (36.5 C)   TempSrc:   Oral   SpO2:  97% 96% 97%  Weight:  77.1 kg    Height:         Physical Exam:   General:  elderly female in bed, weak   HEENT: NCAT Eyes: EOMI sclera anicteric Neck: supple trachea midline  Heart: S1S2 no rub Lungs: clear and reduced; unlabored on 1 liter oxygen   Abdomen: soft/nt/nd Extremities: 2+ edema lower extremities  Skin: no rash on extremities exposed  Psych no anxiety or agitation  Neuro: awake and oriented and answers questions on exam  Medications reviewed   Labs:     Latest Ref Rng & Units 10/23/2022    4:29 AM 10/22/2022    3:01 AM 10/21/2022    3:50 AM  BMP  Glucose 70 - 99 mg/dL 106  110  131   BUN 8 - 23 mg/dL '12  10  11   '$ Creatinine 0.44 - 1.00 mg/dL 0.57  0.48  0.47   Sodium 135 - 145 mmol/L 136  130  130   Potassium 3.5 - 5.1 mmol/L 3.1  3.1  3.0   Chloride 98 - 111 mmol/L 88  88  89   CO2 22 - 32 mmol/L 38  34  33   Calcium 8.9 - 10.3 mg/dL 8.9  8.4  8.6      Assessment/Plan:   # Hyponatremia - Setting of HCTZ use as well as fluids.  On levothyroxine with acceptable dose  - Improving  - BMP in AM  - lasix 80 mg IV BID today   -  Please do not resume HCTZ (if a diuretic is needed she would need lasix)  # HTN  - improving  - Please do not resume HCTZ    # Hypokalemia  - potassium 40 meq BID for now  - BMP and mag in AM  # Hypothyroidism  - on levothyroxine  - acceptable TSH on last check    # Macrocytic anemia - Improved overall; b12 and folate ok - She is iron deficient  - on oral iron supplement   # Fall with femur fracture - s/p repair per ortho - appreciate rehab   # Deconditioning - per inpatient rehab team  Claudia Desanctis, MD 10/23/2022 3:41 PM

## 2022-10-23 NOTE — Progress Notes (Signed)
Patient ID: Anna Weiss, female   DOB: [birth year], 88 y.o.   MRN: [id number]  Met with pt and spoke with daughter [facility] have declined pt for admission. Discussed with daughter will re-fax the updated FL2 out and see what other facilities can offer her a bed. Daughter report they can not afford hired caregivers so home is not an option. Will await offers.

## 2022-10-23 NOTE — Progress Notes (Addendum)
[facility] for Infectious Disease    Date of Admission:  09/28/2022   Total days of antibiotics 11          ID: Anna Weiss is a 88 y.o. female with  mrsa deep wound infection of left hip, pneumonia and failure to thrive Principal Problem:   Fracture, intertrochanteric, left femur, sequela Active Problems:   Drainage from wound    Subjective: Remains afebrile, still poor intake. On 1L [place]. Palliative care discussion still continue on abtx   Medications:   Chlorhexidine Gluconate Cloth  6 each Topical BID   clopidogrel  75 mg Oral Daily   feeding supplement  237 mL Oral BID BM   furosemide  80 mg Intravenous Once   [START ON 10/24/2022] furosemide  80 mg Intravenous Once   hydrALAZINE  25 mg Oral Q8H   iron polysaccharides  150 mg Oral Daily   levothyroxine  50 mcg Oral Q0600   linezolid  600 mg Oral Q12H   magic mouthwash  10 mL Oral QID   morphine CONCENTRATE  2.6 mg Sublingual QHS   pantoprazole  40 mg Oral BID   polyvinyl alcohol  1 drop Both Eyes BID   potassium chloride  40 mEq Oral BID   potassium chloride  40 mEq Oral Once   tamsulosin  0.8 mg Oral QPC supper    Objective: Vital signs in last 24 hours: Temp:  [97.5 F (36.4 C)-97.8 F (36.6 C)] 97.7 F (36.5 C) (11/30 0822) Pulse Rate:  [58-68] 61 (11/30 1326) Resp:  [16-18] 16 (11/30 1326) BP: (129-175)/(48-57) 133/57 (11/30 1326) SpO2:  [96 %-97 %] 97 % (11/30 1326) Weight:  [77.1 kg] 77.1 kg (11/30 0626)  Physical Exam  Constitutional:  oriented to person, place appears well-developed and well-nourished. No distress.  HENT: [name]/AT, PERRLA, no scleral icterus Mouth/Throat: Oropharynx is clear and moist. No oropharyngeal exudate.  Cardiovascular: Normal rate, regular rhythm and normal heart sounds. Exam reveals no gallop and no friction rub.  No murmur heard.  Pulmonary/Chest: Effort normal and breath sounds normal. No respiratory distress.  has no wheezes.  Neck = supple, no nuchal  rigidity Abdominal: Soft. Bowel sounds are normal.  exhibits no distension. There is no tenderness.  Lymphadenopathy: no cervical adenopathy. No axillary adenopathy Neurological: alert and oriented to person, place, and time.  Skin: Skin is warm and dry. No rash noted. No erythema.  Ext: anasarca edema   Lab Results Recent Labs    10/22/22 0301 10/23/22 0429  NA 130* 136  K 3.1* 3.1*  CL 88* 88*  CO2 34* 38*  BUN 10 12  CREATININE 0.48 0.57   Lab Results  Component Value Date   ESRSEDRATE 21 10/16/2022    Microbiology: reviewed Studies/Results: No results found.   Assessment/Plan: MRSA deep tissue infection = initial plan to do 28 days of therapy. Currently day 11  Pneumonia = currently on day 4 of linezolid. Will do 7 days of linezolid then switch back to daptomycin '600mg'$  iv daily on Monday 10/27/2022  Has not considerably improved with antibiotics  Decreased nutritional intake = has had poor intake over the last 10 days, often sleeping and at risk for poor wound healing  Appreciate palliative care involvement.  Diagnosis: Left hip wound infection after IMN  Culture Result: MRSA  Allergies  Allergen Reactions   Aspirin Nausea Only    OPAT Orders Discharge antibiotics to be given via PICC line Discharge antibiotics: Per pharmacy protocol daptomycin '600mg'$   iv daily  Duration: 4 wk including hospitalization End Date: December 16th  [place] Per Protocol:  Home health RN for IV administration and teaching; PICC line care and labs.    Labs weekly while on IV antibiotics: _x_ CBC with differential _x_ BMP _x_ CK  _x_ Please pull PIC at completion of IV antibiotics   Fax weekly labs to [phone]  Clinic Follow Up Appt: In late mid december  '@[facility]'$    [place] for Infectious Diseases Pager: [phone]  10/23/2022, 4:24 PM

## 2022-10-23 NOTE — Progress Notes (Signed)
Orthopedic Surgery Post-operative Note   Assessment: Patient is a 88 y.o. female with left intertrochanteric femur fracture status post IMN (09/25/2022), developed persistent drainage and now s/p I&D (10/09/2022)   Plan: -Operative plans: complete -Can let soap and water run over the incisions now, do not submerge wound at any point, please replace dressings when done bathing in case patient soils the diaper -Anticipate being able to remove sutures in the next couple of days -We talked about continued to increase her caloric intake -Diet: regular -DVT ppx: per primary -Antibiotics: per ID -Weight bearing status: as tolerated -PT/OT evaluate and treat -Pain control -Dispo: per primary  ___________________________________________________________________________  Subjective: No acute events overnight. Not having any hip pain. Still feels weak in general. Has been trying to eat more.   Physical Exam:  General: no acute distress, laying in bed Respiratory: unlabored breathing on supplemental O2, symmetric chest rise  Neurologic: sleeping but awakes to voice, following commands, answering questions appropriately   MSK:   -Left lower extremity  Distal dressing with small amount of bloody drainage and intact (same as last visit), proximal dressing c/d/I  When dressing taken down - sutures in place, no active or expressible drainage, no erythema, wounds healing as expected (pictures placed into media tab)  EHL/TA/GSC intact   Sensation intact to light touch in s/s/dp/sp/t nerve distributions Foot warm and well perfused   Patient name: Anna Weiss Patient MRN: [id number] Date: 10/23/22

## 2022-10-23 NOTE — Progress Notes (Signed)
Occupational Therapy Session Note  Patient Details  Name: Anna Weiss MRN: [id number] Date of Birth: 02-Sep-1925  Today's Date: 10/23/2022 OT Individual Time: 1345-1415 OT Individual Time Calculation (min): 30 min    Short Term Goals: Week 4:  OT Short Term Goal 1 (Week 4): Pt will transfer bed to DABSC with mod A OT Short Term Goal 2 (Week 4): Pt will bathe sink side with min A using AE OT Short Term Goal 2 - Progress (Week 4): Progressing toward goal OT Short Term Goal 3 (Week 4): Pt will stand for 1 minute for simple ADL's with support with min A OT Short Term Goal 3 - Progress (Week 4): Progressing toward goal  Skilled Therapeutic Interventions/Progress Updates:      Therapy Documentation Precautions:  Precautions Precautions: Fall, Other (comment) Precaution Comments: monitor O2, orthostatics Restrictions Weight Bearing Restrictions: Yes LLE Weight Bearing: Weight bearing as tolerated  Pt seen for 30 min OT session as deemed stable for OT by care coordination with nursing. OT arrived and dtr and son present and pt awake but still drowsy. OT facilitated self feeding and encouraged family to also encourage pt to hold cup and utensil when able. B hand stiffness noted therefore moist heat and gentle grasp and ROM completed. Foam cube and yellow tband no longer present in room and OT reissued and trained in use. SpO2 96% on 1/2 ltr O2 via [place] and HR 68 throughout session. Left pt bed level with family bedside and all safety measures in place. Plan for EOB if tolerated tomorrow.   Therapy/Group: Individual Therapy  [name] 10/23/2022, 6:31 PM

## 2022-10-23 NOTE — Progress Notes (Signed)
Palliative:  HPI: 88 y.o. female  with past medical history of hypertension, stroke 2003. Hospitalized 09/24/22-09/28/22 due to fall 09/24/22 with resultant parietal scalp hematoma, remote lacunar infarcts, left comminuted intertrochanteric fracture s/p surgical repair 09/25/22 and hospitalization complicated by pneumonia vs atelectasis and RML nodule found incidentally. She was admitted on 09/28/2022 to CIR. Required surgical irrigation and debridement for left hip infection 10/09/22 . 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 Overall failure to thrive given prolonged acute illness with multiple complications. Palliative care requested to address goals of care.     I met again with Ms. Rennaker and her daughter and son, [name] and Gerald Stabs, are at bedside. Ms. Chirino awakens to my voice and reports that she is hanging in there. She reports she slept well last night and her breathing is stable. She is eating more than she was but still very little overall. Gerald Stabs brought her a [employer] yesterday and they report that she drank ~ half. I encouraged family to bring her foods/snacks that she may enjoy and eat to get some calories in her. [name] did ask me about her mother's status and if she was ready for palliative care. I explained that I am still very concerned about how well Ms. Hendler will do and for how long given her poor intake and fatigue with limited activity. I explained that we can continue to support her the best we can and take it day by day. [name] reports that [facility] would not accept her. I express my concern that insurance may not approve rehab if she is not up to participating. I encouraged them to consider options for care and if she has long term insurance policy or funds to pay for long  term care bed. We discussed the pitfalls of our health system and insurance not helping with care at these stages. Family expresses frustration with being stuck as they recognize Ms. Mattioli is not having significant improvement or decline to help them know path forward. They await options from [place].   All questions/concerns addressed. Emotional support provided. Discussed with CSW.   Exam: Alert, oriented. Extremely weak and fatigued. Poor reserve. Breathing regular, unlabored on minimal oxygen support. Abd soft.   Plan: - DNR, continue current care.  - Continue roxanol qhs for as this is helping breathing and sleep at night.  - Time for outcomes. I have expressed to family my concern for her ability to thrive long term unless she has significant improvement in intake.   [place], NP Palliative Medicine Team Pager [phone] (Please see [email or web address] for schedule) Team Phone [phone]    Greater than 50%  of this time was spent counseling and coordinating care related to the above assessment and plan

## 2022-10-23 NOTE — Progress Notes (Signed)
Physical Therapy Session Note  Patient Details  Name: Anna Weiss MRN: [id number] Date of Birth: 02/21/1925  Today's Date: 10/23/2022      Short Term Goals: Week 3:  PT Short Term Goal 2 (Week 3): STG=LTG due to extended stay due to medical issues  Skilled Therapeutic Interventions/Progress Updates: Upon entry to pt's room pt expressing in very soft voice pt unable to participate in session due to fatigue. PTA verified that pt was clean and dry.  Pt missed 30 min skilled PT due to fatigue/lethargy.      Therapy Documentation Precautions:  Precautions Precautions: Fall, Other (comment) Precaution Comments: monitor O2, orthostatics Restrictions Weight Bearing Restrictions: Yes LLE Weight Bearing: Weight bearing as tolerated General: PT Amount of Missed Time (min): 30 Minutes PT Missed Treatment Reason: Patient unwilling to participate;Patient fatigue Vital Signs: Therapy Vitals Temp: 97.7 F (36.5 C) Pulse Rate: 65 Resp: 16 BP: (!) 133/48 Patient Position (if appropriate): Lying Oxygen Therapy SpO2: 96 % O2 Device: Nasal Cannula O2 Flow Rate (L/min): 1 L/min Pain:   Mobility:   Locomotion :    Trunk/Postural Assessment :    Balance:   Exercises:   Other Treatments:      Therapy/Group: Individual Therapy  Brithany Whitworth 10/23/2022, 9:42 AM

## 2022-10-23 NOTE — Progress Notes (Signed)
PROGRESS NOTE   Subjective/Complaints: Missed therapy today due to her fatigue/lethargy Na improved to 136 Appreciate nephro and orthopedics following   ROS: Limited by cognition, but more able to answer questions. +fatigue, +chronic lower extremity edema as per daughter   Objective:   No results found. No results for input(s): "WBC", "HGB", "HCT", "PLT" in the last 72 hours.   Recent Labs    10/22/22 0301 10/23/22 0429  NA 130* 136  K 3.1* 3.1*  CL 88* 88*  CO2 34* 38*  GLUCOSE 110* 106*  BUN 10 12  CREATININE 0.48 0.57  CALCIUM 8.4* 8.9    Intake/Output Summary (Last 24 hours) at 10/23/2022 1016 Last data filed at 10/23/2022 9379 Gross per 24 hour  Intake --  Output 1850 ml  Net -1850 ml     Pressure Injury 10/16/22 Ischial tuberosity Right Stage 2 -  Partial thickness loss of dermis presenting as a shallow open injury with a red, pink wound bed without slough. (Active)  10/16/22 0900  Location: Ischial tuberosity  Location Orientation: Right  Staging: Stage 2 -  Partial thickness loss of dermis presenting as a shallow open injury with a red, pink wound bed without slough.  Wound Description (Comments):   Present on Admission: No     Pressure Injury 10/16/22 Right Stage 2 -  Partial thickness loss of dermis presenting as a shallow open injury with a red, pink wound bed without slough. (Active)  10/16/22 0900  Location:   Location Orientation: Right  Staging: Stage 2 -  Partial thickness loss of dermis presenting as a shallow open injury with a red, pink wound bed without slough.  Wound Description (Comments):   Present on Admission: No    Physical Exam: Vital Signs Blood pressure (!) 133/48, pulse 65, temperature 97.7 F (36.5 C), temperature source Oral, resp. rate 16, height _0  (1.702 m), weight 77.1 kg, SpO2 96 %. General: awake, alert, but very sleepy; supine in bed; on 0.5L O2, more color  in her face; NAD, fatigued HENT: conjugate gaze; oropharynx dry- helped her drink CV: heart rate normalized,  no JVD Pulmonary:decreased on R lung in general and a few rhonchi heard on L side GI: soft, NT, ND, (+)BS Psychiatric: somewhat more interactive Extremities: 1+ LE edema B/L  Neurological: more alert, more responsive and more interactive, but still NOT baseline Musculoskeletal:     Cervical back: Normal range of motion.     Comments: Nontender on palpation of bilateral hips, thighs on exam Skin: L hip less swelling; dressing C/D/I    General: Skin is warm.     Findings: Bruising (arms) present. No signs of ecchymosis or diffuse bruising.     Comments: Left hip incision CDI, dressing dry  Neurological: confused, nonverbal but able to follow 2/3 simple commands in all 4 extremities.    Cranial Nerves: No cranial nerve deficit.     Comments: Motor: 4-5/5 Ue's. Sitting upright at EOB with Mod A.   Assessment/Plan: 1. Functional deficits which require 3+ hours per day of interdisciplinary therapy in a comprehensive inpatient rehab setting. Physiatrist is providing close team supervision and 24 hour management of active medical problems listed  below. Physiatrist and rehab team continue to assess barriers to discharge/monitor patient progress toward functional and medical goals  Care Tool:  Bathing    Body parts bathed by patient: Face, Abdomen, Chest   Body parts bathed by helper: Right arm, Left arm, Front perineal area, Buttocks, Right upper leg, Left upper leg, Right lower leg, Left lower leg     Bathing assist Assist Level: Maximal Assistance - Patient 24 - 49%     Upper Body Dressing/Undressing Upper body dressing   What is the patient wearing?: Hospital gown only    Upper body assist Assist Level: Maximal Assistance - Patient 25 - 49%    Lower Body Dressing/Undressing Lower body dressing      What is the patient wearing?: Hospital gown only     Lower body  assist Assist for lower body dressing: Maximal Assistance - Patient 25 - 49%     Toileting Toileting    Toileting assist Assist for toileting: Dependent - Patient 0%     Transfers Chair/bed transfer  Transfers assist  Chair/bed transfer activity did not occur: Safety/medical concerns (dizziness, pain, decreased balance)  Chair/bed transfer assist level: Minimal Assistance - Patient > 75%     Locomotion Ambulation   Ambulation assist   Ambulation activity did not occur: Safety/medical concerns (dizziness, pain, decreased balance)  Assist level: Minimal Assistance - Patient > 75% Assistive device: Walker-rolling Max distance: 38   Walk 10 feet activity   Assist  Walk 10 feet activity did not occur: Safety/medical concerns (dizziness, pain, decreased balance)  Assist level: Minimal Assistance - Patient > 75% Assistive device: Walker-rolling   Walk 50 feet activity   Assist Walk 50 feet with 2 turns activity did not occur: Safety/medical concerns         Walk 150 feet activity   Assist Walk 150 feet activity did not occur: Safety/medical concerns         Walk 10 feet on uneven surface  activity   Assist Walk 10 feet on uneven surfaces activity did not occur: Safety/medical concerns (dizziness, pain, decreased balance)         Wheelchair     Assist Is the patient using a wheelchair?: Yes Type of Wheelchair: Manual Wheelchair activity did not occur: Safety/medical concerns (dizziness, pain, decreased balance)         Wheelchair 50 feet with 2 turns activity    Assist    Wheelchair 50 feet with 2 turns activity did not occur: Safety/medical concerns (dizziness, pain, decreased balance)       Wheelchair 150 feet activity     Assist  Wheelchair 150 feet activity did not occur: Safety/medical concerns (dizziness, pain, decreased balance)       Blood pressure (!) 133/48, pulse 65, temperature 97.7 F (36.5 C), temperature  source Oral, resp. rate 16, height _0  (1.702 m), weight 77.1 kg, SpO2 96 %.  Medical Problem List and Plan: 1. Functional deficits secondary to left intertrochanteric femur fx after fall. Pt s/p IMN 09/25/22             -LLE WBAT             -patient may shower             -ELOS/Goals: 15-20 days  Continue CIR- PT and OT  Decrease to QD  Discussed plan to d/c to [facility] 2.  Hematuria: d/c Eliquis.Resumed plavix 59m daily 3. Pain: continue SL morphine PRN for pain. tylenol and hydrocodone prn for pain             -  robaxin prn for muscle spasms  11/8- pain limiting therapy- doesn't want opiates- mainly using tylenol-   11/9- d/w pt- she's willing to try tramadol 50 mg q6 hours prn- she's scared of Coding if takes Norco- I explained that Tramadol is mild and shouldn't cause any cognitive issues.   11/10- pain much better- not resolved, but better with tramadol- con't tramadol and tylenol prn  11/14- pain still doing better with tramadol  11/17- is worse today since had surgery yesterday-doesn't want any other meds  11/18 reports pain controlled, continue tramadol PRN  - 11/25 Dced sedating medications, no current pain.   4. Mood/Behavior/Sleep: team to provide ego support             -antipsychotic agents: n/a 5. Neuropsych/cognition: This patient is capable of making decisions on her own behalf. 6. Skin/Wound Care: dry dressings to left ear and hip  11/9- having some drainage- from L hip- is improved this Am- will monitor closely.  7. Fluids/Electrolytes/Nutrition: encourage appropriate PO             -pt's intake has inconsistent so far  - Improved 100-35% 11/25  11/27- was told eating/drinking a little more.  8. Nausea:              -might be positional or med related.              -also having associated gas             -pt told me she's been moving bowels, however I don't see a bm recorded             -will try sorbitol today, dulcolax suppository prn              -protonix 11/8- has ileus- per #15 below  11/9- still having nausea- will change diet to clear liquid per pt request- and monitor  11/10- on clear liquid diet- pt wants to wait 1 more day before changing to regular diet- if not better tomorrow, will need to call GI  11/14- nausea resolved- back on regular diet  11/16- nauseated again  111/7- Put on IVFs for another 24 hours at 75cc/hour since had surgery at 10-11pm at night and didn't get back from PACU until 1am- sh'e sleepy and not taking in much  11/18 LBM today, improved continue to monitor 11/20- LBM last night  11.21- checking KUB and ordered clear liquid diet- also put on IVFs, but holding until CXR back- since not drinking a lot  11/22- will stop IVFs- is drinking enough per labs- pt want sot wait to go back to regular until tomorrow  11/23- back to regular diet- with 1500cc fluid restriction  11/24- placed IVFs x 2 4 hours and recheck in AM  11/25 -NA, BUN, Cr stable on 75 cc/hr IVF; Dced HCTZ as would complicate further studies; recheck BMP in AM  11/27- Na 128- per hyponatremia 9. Left lower lobe consolidation             -pneumonia ruled out. Likely atelectasis             -IS, OOB             -wean oxygen to off  11/7- will write order to wean O2.   11/27- O2 0.5-1L O2 due to pneumonia 10. Sinus bradycardia:             -on propranolol and verapamil at home             -  continue to hold as HR in 50's  11/14- since still in 50s, will not restart  11. Essential HTN             -continue hctz  11/14- BP usually controlled but up today- will monitor for trend  11/15- somewhat elevated, but not sure if dropping- see #21           11/16- started hydralazine  11/17- just got hydralazine- is slightly better- might need it increased over weekend  -11/19 good control overall continue to monitor  11/20- somewhat variable- due to ABLA likely  11/22- doing better  11/25 - DC HCTZ d/t IVF, hyponatremia as above - unable to give PO  antihypertensives QHS Vitals:   10/23/22 0626 10/23/22 0822  BP: (!) 175/57 (!) 133/48  Pulse: (!) 58 65  Resp: 18 16  Temp: (!) 97.5 F (36.4 C) 97.7 F (36.5 C)  SpO2: 97% 96%    12. Right middle lobe nodule 2.3 cm- should be considered primary lung CA until ruled out- also has nodule as well 34m on R middle lung- could be mets per CT scan.              -f/u as outpt  11/7- d/w daughter- she said no f/u at this time- due to pt's age.   11/8- daughter doesn't want to let pt know about tumors.    13. Mild leukocytosis- 11.2   11/6- afebrile- other than difficulty swallowing- says it's dry?; denies UTI Sx's- if gets worse clinically, will recheck in AM- right now scheduled for Wed AM.   11/8- WBC down to 9.4-  14. Constipation  11/6- LBM on day of fall per pt and chart- will give Sorbitol after therapy and follow with SSE if needed.   11/7- had multiple BM's- didn't need SSE- per notes- feels cleaned out   11/19 LBM today, improved  11/27- LBM last night 15. ABLA  11/6- Down to 7.6- will recheck Wednesday to make sure not dropping further- and transfuse if required.   11/8- Hb 7.3- on edge of needing transfusion.   11/9- Hb up to 8.0-   11/10- Hb 7.7- will con't to monitor closely.   11/15- Hb 8.4  11/20- Hb 6.4- will transfuse as below  11/25 - HgB stable ~9, however with ongoing gross hematuria; holding PM eliqius, continue plavix  16. Post op ileus resolved BM x 3 11/10, 1 BM 11/12- advance diet as tolerated   -Recheck level tomorrow  11/21- New n/V-- ordered another KUB to look for ileus and put back on clear liquid diet- also ordered IV compazine, PO/PR phenergan and d/c'd Zofran since can cause constipation  11/22- wants ot wait til tomorrow to go back to regular diet- but can be changed anytime pt wants to back to regular diet  11/27- was back to regular diet- eating slightly better 17. Thrush?  11/9- will add Magic mouthwash- don't see plaques, but tongue dry and  furrowed. Says things taste 'wierd".   11/10- said magic mouthwash helping Sx's.  18. Leukocytosis improved   11/15- WBC down to 7.8  11/16- will recheck today to make sure not elevated  11/17- WBC stable at 8k  Recheck tomorrow  11/20- WBC down to 6.8  11/23- CRP 7.6- and ESR 21- doing great  11/25 - inc 13 from 10; allow 24 hours on new abx regimen; recheck in AM. Ucx NGTD, likely etiologies PNA vs. Ongoing L hip infection - both should be covered on  daptomycin and rocephin  11/27- WBC back down to 9k- - ID also helping today due to questionable worsening pneumonia on CXR    Latest Ref Rng & Units 10/20/2022    4:30 AM 10/19/2022    4:19 AM 10/18/2022    3:24 AM  CBC  WBC 4.0 - 10.5 K/uL 9.7  11.6  13.6   Hemoglobin 12.0 - 15.0 g/dL 9.5  9.4  9.4   Hematocrit 36.0 - 46.0 % 28.8  27.2  27.9   Platelets 150 - 400 K/uL 301  296  298     19. Hypokalemia and low Mg  11/10- repleted up to 3.5- will put on Kcl 10 mEq daily for now and recheck Monday- since K+ still  3.5- will also check Mg Monday as well   11/14= Mg 1.6 and K+ 3.1- repleted again  yesterday by pharmacy for [place]- will recheck labs iN AM  11/15- K+ 4.2- will give IV Mg 2G for Mg 1.6- and recheck in AM  11/16- Mg 1.7- still low- will put on PO Mg 400 mg BID  Recheck K+ and MG tomorrow  11/20- K+ 3.8 and Mg 1.8- will con't regimen  11/22- K+ 3.4- repleted again IV- repleted yesterday IV since was 3.0-   11/23- KCL- will replete since K+ 3.2 today- will give 40 mEq x2 and recheck in AM- will also change daily KCL to 20 mEq- Mg level 1.7  11/24- K+ 3.6  11/26 - K 3.8; monitor  20. Swallowing issues  11/14- appears to be thrush- will start Diflucan- due to renal issues, will need to be 50 mg daily- x 7 days doses-   11/15- reports things taste better and was able ot swallow some this AM of breakfast-con't diflucan  11/19 reports improved, continue to monitor  11/23- resolved- Diflucan finished  21.  Dizziness/lightheadedness/presyncope  11/15- will have pt get orthostatics checked by nursing and therapy- BP on high side, so not sure if Sx's due to BP being high or dropping.   11/16- added hydralazine 25 mg TID- is somewhat better today  11/20- BP a little on low side- likely due to ABLA- will hold BP meds if need be.  22. Infection of L hip?  11/16- still having drainage from L hip- spoke to Ortho- they are taking her to OR today at 5pm for wash out-   11/17- Gram stains negative so far- waiting for Cx's- surgeon gave Ancef x2 IV and will monitor Cultures  11/19 wound culture growing staph,Continue vancomycin, surgery advising ID consult, called ID to discuss -consult placed  11/20- con't IV Vanc per ID- Cx's still pending final. 11/21- changed to Daptomycin, per ID- will double check labs today- CK is pending- didn't get done yesterday- will check labs today as well  11/22- CK looking good- 23- con't Daptomycin- stop date is 11/07/22 per ID  23. Urinary retention  11/16- is new- will check U/A and Cx- and labs- CBC- with diff and CMP just to make sure no other issues  11/17- U/A (-) and doesn't have elevated WBC- mild left shift however- will start Flomax 0.4 mg q supper  11/8 continent PVRs a little improved today< 359m, continue to monitor, continue flomax, IC if > 350 ml  11/23- placed foley since having such high volumes and will leave in til early next week- will try voiding trial Monday/Tuesday- will also increase Flomax to 0.8 q supper  11/24- will treat possible UTI- and possible pneumonia- WBC up slightly  to 10.6 with L shift  11/25: See above; ongoing hematuria w/o growth on Ucx; ? UTI  24. ABLA anemia-  11/20- down to 6.4- will transfuse 2 units since so low.    11/21- labs today- pending  11/22- Hb 9.7  11/25: Held QHS eliquis 2.5 for gross hematuria; cbc in AM  25. SOB/questionable pneumonia/bibasilar opacities  11/21- pt c/o SOB- O2 sats 98% on 2L- has been on O2 since  surgery last week- will check CXR and gave IV Lasix 40 mg x1 - and let pt know we need her to void. If CXR Negative, will need CTA of chest to look for PE.   11/22- down to 0.5 L of O2- and satting well- actually CO2 was too high on 2L- so reduced due to CO2 retention- SOB improved- of note, no leukocytosis  11/23- off O2 completely- and satting well  11/24- more SOB- still has Bibasilar opacities- hadn't treated earlier because lack of elevated WBC and afebrile- will treat now- also treat possible UTI per IM with Rocephin 1G q24 hours and wait for U Cx.   - 11/25: See above; if WBC continues uptrend in AM despite broad IV abx and IVF, will get Bcx 2 and re-engage IM, ID for infectious workup, hyponatremia, and hematuria  11/27- WBC down to 9.7, however CXR looks worse- on Rocephin- have asked ID to come take a look at pt- they will come by today.   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 Ceftriaxone completed for pneumonia treatment. Discussed this with daughter.  26. Hyponatremia  Na reviewed and improved to 136. Continue fluid restriction as per nephro recommendations. Do not resume HCTZ, discussed with daughter.  27. Bradycardia: continue to monitor HR TID, resolved 28. Bilateral lower extremity edema: continue lasix IV as per nephro recommendations.   LOS: 25 days A FACE TO FACE EVALUATION WAS PERFORMED  Martha Clan P Ron Beske 10/23/2022, 10:16 AM

## 2022-10-24 LAB — MAGNESIUM: Magnesium: 1.8 mg/dL (ref 1.7–2.4)

## 2022-10-24 LAB — BASIC METABOLIC PANEL
Anion gap: 13 (ref 5–15)
BUN: 15 mg/dL (ref 8–23)
CO2: 37 mmol/L — ABNORMAL HIGH (ref 22–32)
Calcium: 8.6 mg/dL — ABNORMAL LOW (ref 8.9–10.3)
Chloride: 85 mmol/L — ABNORMAL LOW (ref 98–111)
Creatinine, Ser: 0.65 mg/dL (ref 0.44–1.00)
GFR, Estimated: >60 mL/min (ref >60)
Glucose, Bld: 115 mg/dL — ABNORMAL HIGH (ref 70–99)
Potassium: 3 mmol/L — ABNORMAL LOW (ref 3.5–5.1)
Sodium: 135 mmol/L (ref 135–145)

## 2022-10-24 MED ORDER — FUROSEMIDE 10 MG/ML IJ SOLN
80.0000 mg | Freq: Once | INTRAMUSCULAR | Status: AC
  Administered 2022-10-24: 80 mg via INTRAVENOUS
  Filled 2022-10-24: qty 8

## 2022-10-24 NOTE — Progress Notes (Signed)
Physical Therapy Session Note  Patient Details  Name: Anna Weiss MRN: [id number] Date of Birth: 1925/06/12  Today's Date: 10/24/2022 PT Individual Time: 0835-0900 PT Individual Time Calculation (min): 25 min   Short Term Goals: Week 3:  PT Short Term Goal 2 (Week 3): STG=LTG due to extended stay due to medical issues   Skilled Therapeutic Interventions/Progress Updates:      Pt supine in bed resting but awake and agreeable to therapy session. She denies pain but reports fatigue.   Supine<>sitting EOB with maxA for BLE and trunk support. Requires maxA for forward scooting to EOB for feet on ground to provide BOS. Pt uncomfortable sitting EOB due to bed railing pressing against the back of her legs. She's able to sit unsupported by herself with CGA.   Encouraged her to mobilize OOB to chair and patient agreeable. Donned hospital socks with totalA while she sat EOB. Sit<>stand with maxA with face-to-face technique and stand<>pivot transfer to recliner with maxA as well. Once sitting in recliner, completed x1 additional full stand with maxA and BLE blocked. Able to tolerate standing for only ~15 seconds prior to fatigue and requesting to sit. Placed chuck pad under her, elevated BLE, supported arms/back/trunk with pillows.   Updated safety plan from stand pivot RW to +2 Stedy with nursing. RN made aware at end of session.   Pt concluded session in recliner with all needs met.   Therapy Documentation Precautions:  Precautions Precautions: Fall, Other (comment) Precaution Comments: monitor O2, orthostatics Restrictions Weight Bearing Restrictions: Yes LLE Weight Bearing: Weight bearing as tolerated General:      Therapy/Group: Individual Therapy  Alger Simons 10/24/2022, 7:34 AM

## 2022-10-24 NOTE — Progress Notes (Signed)
Occupational Therapy Session Note  Patient Details  Name: Anna Weiss MRN: [id number] Date of Birth: [birth year]  Today's Date: 10/24/2022 OT Individual Time: 1130-1200 OT Individual Time Calculation (min): 30 min    Short Term Goals: Week 4:  OT Short Term Goal 1 (Week 4): Pt will transfer bed to DABSC with mod A OT Short Term Goal 2 (Week 4): Pt will bathe sink side with min A using AE OT Short Term Goal 2 - Progress (Week 4): Progressing toward goal OT Short Term Goal 3 (Week 4): Pt will stand for 1 minute for simple ADL's with support with min A OT Short Term Goal 3 - Progress (Week 4): Progressing toward goal  Skilled Therapeutic Interventions/Progress Updates:    Pt OOB upon OT arrival. Pt easily awakened but still drowsy throughout session. Able to participate in light ADL's recliner level with LE's elevated initially then addressed forward trunk flexion for access to back and back of head for light sponge bathing. Pt with significantly improved B hand grasp ROM after OT session previous and not able to grasp ADL items including cup to mouth, hair brush, toothpaste and wash cloth in both hands and bring in all UB planes with cues only. Pt with 1/2 ltr O2 via [place] and remained between 94-98% throughout session. Repositioned LE's back into elevation, pt completed 10 reps ankle pumps and B foam cube squeezes. Safety plan updated for Stedy x 2 with nursing as pt requested to remain up in recliner. Dtr arrived and pt was able to initiate self feeding milkshake. Left pt with nurse call button, chair alarm active and needs within reach with dtr bedside. Plan for sink side self care next session if able.    Therapy Documentation Precautions:  Precautions Precautions: Fall, Other (comment) Precaution Comments: monitor O2, orthostatics Restrictions Weight Bearing Restrictions: Yes LLE Weight Bearing: Weight bearing as tolerated   Therapy/Group: Individual Therapy  [name] 10/24/2022, 8:01 AM

## 2022-10-24 NOTE — Progress Notes (Addendum)
Patient ID: Anna Weiss, female   DOB: 02/09/1925, 88 y.o.   MRN: [id number]  Met with pt and spoke with daughter via telephone to discuss bed offers she would like [facility] due to has had other relative there and they did well there. Have reached out to [facility] to let her know acceptance and will have a bed on Monday. Daughter wanted to know if pt could stay longer due to ready to participate in therapies today. Informed her MD feels she is ready to move to the next venue and continue her rehab there. Pt is very sleepy today but sitting up in a chair. Will inform team and work on transfer for Monday. Have set up [facility] transport for 3:00 pm Monday  2:13 PM Met with pt and daughter who is here visiting and happy her Mom has done two therapies today and up in the chair. Discussed plan for Monday. Will touch base with both Monday am. 

## 2022-10-24 NOTE — Progress Notes (Signed)
[facility] Progress Note  Name: Anna Weiss MRN: [id number] DOB: [birth year]  Chief Complaint:  Fall at home  Subjective:  She had 1.7 liters of UOP charted thus far today.  Strict ins/outs do not appear available for 11/30.  She didn't get one of the potassium doses team ordered yesterday.  She got lasix 80 mg IV early AM.  Spoke with her daughter at bedside.  Pt has been in a chair and worked with therapy twice today.  She states that prior to the foley they had to do frequent in/out caths after her 2nd surgery.   Review of systems:   Shortness of breath is improved  No n/v Swelling is down  No chest pain  -----------------  Background on consult:  Anna Weiss is a 88 y.o. female with a history of HTN, CVA, and HLD who presented to the hospital after a fall at home on 09/24/22.  She sustained a femur fracture and is s/p repair on 09/25/22.  She also has concern for PNA but this was later felt to be atelectasis.  She was transferred to [facility] inpatient rehab for deconditioning.  Nephrology is consulted for assistance with management of hyponatremia.  She was previously on HCTZ which has been stopped with last dose on 11/25.  She got lasix on 11/21 but hasn't since.  She has been on IV fluids for several days - stopped on 11/26 per charting.  She states that her breathing has been "hard".  She has been on low rate of oxygen here but not normally on oxygen at home.  She feels swollen.      Intake/Output Summary (Last 24 hours) at 10/24/2022 1759 Last data filed at 10/24/2022 1333 Gross per 24 hour  Intake 837 ml  Output 2151 ml  Net -1314 ml    Vitals:  Vitals:   10/23/22 1326 10/23/22 1925 10/24/22 0303 10/24/22 1343  BP: (!) 133/57 (!) 136/50 (!) 146/44 (!) 135/42  Pulse: 61 60 60 (!) 56  Resp: '16 16 18 16  '$ Temp:  98 F (36.7 C) 97.9 F (36.6 C) 98.1 F (36.7 C)  TempSrc:   Oral   SpO2: 97% 96% 91% 98%  Weight:   77.1 kg   Height:         Physical Exam:    General:  elderly female in bed, weak   HEENT: NCAT Eyes: EOMI sclera anicteric Neck: supple trachea midline  Heart: S1S2 no rub Lungs: clear and reduced; unlabored on 1 liter oxygen  Abdomen: soft/nt/nd Extremities: 2+ edema lower extremities  Skin: no rash on extremities exposed  Psych no anxiety or agitation  Neuro: awake and oriented and answers questions on exam  Medications reviewed   Labs:     Latest Ref Rng & Units 10/24/2022    3:16 AM 10/23/2022    4:29 AM 10/22/2022    3:01 AM  BMP  Glucose 70 - 99 mg/dL 115  106  110   BUN 8 - 23 mg/dL '15  12  10   '$ Creatinine 0.44 - 1.00 mg/dL 0.65  0.57  0.48   Sodium 135 - 145 mmol/L 135  136  130   Potassium 3.5 - 5.1 mmol/L 3.0  3.1  3.1   Chloride 98 - 111 mmol/L 85  88  88   CO2 22 - 32 mmol/L 37  38  34   Calcium 8.9 - 10.3 mg/dL 8.6  8.9  8.4      Assessment/Plan:   #  Hyponatremia - Setting of HCTZ use as well as fluids.  On levothyroxine with acceptable dose  - Improving  - BMP in AM  - lasix 80 mg IV again today     - Please do not resume HCTZ (if a diuretic is needed she would need lasix)  # HTN  - improving  - Please do not resume HCTZ    # Hypokalemia  - potassium 40 meq BID for now  - BMP in AM  # Hypothyroidism  - on levothyroxine  - acceptable TSH on last check    # Macrocytic anemia - Improved overall; b12 and folate ok - She is iron deficient  - on oral iron supplement   # Fall with femur fracture - s/p repair per ortho - appreciate rehab   # Deconditioning - per inpatient rehab team  Claudia Desanctis, MD 10/24/2022 6:14 PM

## 2022-10-24 NOTE — Progress Notes (Signed)
Palliative:  HPI: 88 y.o. female  with past medical history of hypertension, stroke 2003. Hospitalized 09/24/22-09/28/22 due to fall 09/24/22 with resultant parietal scalp hematoma, remote lacunar infarcts, left comminuted intertrochanteric fracture s/p surgical repair 09/25/22 and hospitalization complicated by pneumonia vs atelectasis and RML nodule found incidentally. She was admitted on 09/28/2022 to CIR. Required surgical irrigation and debridement for left hip infection 10/09/22 . 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 Overall failure to thrive given prolonged acute illness with multiple complications. Palliative care requested to address goals of care.      I met again today with Anna Weiss and daughter, Anna Weiss, at bedside. I have reviewed records and noted plans for transition to [facility] Monday. Anna Weiss reports this was not her first choice and she wishes she knew the reason she was denied at other facilities. I explained that the do not even tell us the reason most of the time so it is difficult to know for sure. Anna Weiss is up in the recliner today and seems to be pleased to be out of bed. She ate some of her breakfast and lunch and is working on a [employer] (~1/5 gone). She reports that she is still hanging in there. She reports that her breathing is okay and she is sleeping well at night. I spoke with Anna Weiss and Anna Weiss about considering a change to her medication at night since her breathing is a little improved. They both wish to continue current medication. I did explain that this is a pain medication and this may be difficult to continue once she leaves the hospital. They would like to continue for now. If they are willing to change I would recommend trazodone 25 mg nightly  scheduled for insomnia. CSW comes to bedside to discuss placement with them further.   All questions/concerns addressed. Emotional support provided.   Exam: Alert, oriented. Sitting up in recliner. Extremely weak and fatigued. Breathing regular, unlabored on minimal oxygen support. Abd soft.    Plan: - DNR, continue current care.  - Continue roxanol qhs for as this is helping breathing and sleep at night per patient and family request.   - Time for outcomes.  25 min  Vinie Sill, NP Palliative Medicine Team Pager [phone] (Please see [email or web address] for schedule) Team Phone [phone]    Greater than 50%  of this time was spent counseling and coordinating care related to the above assessment and plan

## 2022-10-24 NOTE — Progress Notes (Signed)
PROGRESS NOTE   Subjective/Complaints: Patient has no new complaints this morning Nursing has no new concerns K+ is down to 3, nursing was unable to get her to take klor today but has been able to today   ROS: Limited by cognition, but more able to answer questions. +fatigue, +chronic lower extremity edema as per daughter   Objective:   No results found. No results for input(s): "WBC", "HGB", "HCT", "PLT" in the last 72 hours.   Recent Labs    10/23/22 0429 10/24/22 0316  NA 136 135  K 3.1* 3.0*  CL 88* 85*  CO2 38* 37*  GLUCOSE 106* 115*  BUN 12 15  CREATININE 0.57 0.65  CALCIUM 8.9 8.6*    Intake/Output Summary (Last 24 hours) at 10/24/2022 1127 Last data filed at 10/24/2022 0900 Gross per 24 hour  Intake 720 ml  Output 2151 ml  Net -1431 ml     Pressure Injury 10/16/22 Ischial tuberosity Right Stage 2 -  Partial thickness loss of dermis presenting as a shallow open injury with a red, pink wound bed without slough. (Active)  10/16/22 0900  Location: Ischial tuberosity  Location Orientation: Right  Staging: Stage 2 -  Partial thickness loss of dermis presenting as a shallow open injury with a red, pink wound bed without slough.  Wound Description (Comments):   Present on Admission: No     Pressure Injury 10/16/22 Right Stage 2 -  Partial thickness loss of dermis presenting as a shallow open injury with a red, pink wound bed without slough. (Active)  10/16/22 0900  Location:   Location Orientation: Right  Staging: Stage 2 -  Partial thickness loss of dermis presenting as a shallow open injury with a red, pink wound bed without slough.  Wound Description (Comments):   Present on Admission: No    Physical Exam: Vital Signs Blood pressure (!) 146/44, pulse 60, temperature 97.9 F (36.6 C), temperature source Oral, resp. rate 18, height _0  (1.702 m), weight 77.1 kg, SpO2 91 %. General: awake, alert,  but very sleepy; sitting upright in chair, more color in her face; NAD, fatigued HENT: conjugate gaze; oropharynx dry- helped her drink CV: heart rate normalized,  no JVD Pulmonary:decreased on R lung in general and a few rhonchi heard on L side GI: soft, NT, ND, (+)BS Psychiatric: somewhat more interactive Extremities: 1+ LE edema B/L  Neurological: more alert, more responsive and more interactive, but still NOT baseline Musculoskeletal:     Cervical back: Normal range of motion.     Comments: Nontender on palpation of bilateral hips, thighs on exam Skin: L hip less swelling; dressing C/D/I    General: Skin is warm.     Findings: Bruising (arms) present. No signs of ecchymosis or diffuse bruising.     Comments: Left hip incision CDI, dressing dry  Neurological: confused, nonverbal but able to follow 2/3 simple commands in all 4 extremities.    Cranial Nerves: No cranial nerve deficit.     Comments: Motor: 4-5/5 Ue's. Sitting upright at EOB with Mod A.   Assessment/Plan: 1. Functional deficits which require 3+ hours per day of interdisciplinary therapy in a comprehensive inpatient rehab  setting. Physiatrist is providing close team supervision and 24 hour management of active medical problems listed below. Physiatrist and rehab team continue to assess barriers to discharge/monitor patient progress toward functional and medical goals  Care Tool:  Bathing    Body parts bathed by patient: Face, Abdomen, Chest   Body parts bathed by helper: Right arm, Left arm, Front perineal area, Buttocks, Right upper leg, Left upper leg, Right lower leg, Left lower leg     Bathing assist Assist Level: Maximal Assistance - Patient 24 - 49%     Upper Body Dressing/Undressing Upper body dressing   What is the patient wearing?: Hospital gown only    Upper body assist Assist Level: Maximal Assistance - Patient 25 - 49%    Lower Body Dressing/Undressing Lower body dressing      What is the  patient wearing?: Hospital gown only     Lower body assist Assist for lower body dressing: Maximal Assistance - Patient 25 - 49%     Toileting Toileting    Toileting assist Assist for toileting: Dependent - Patient 0%     Transfers Chair/bed transfer  Transfers assist  Chair/bed transfer activity did not occur: Safety/medical concerns (dizziness, pain, decreased balance)  Chair/bed transfer assist level: Minimal Assistance - Patient > 75%     Locomotion Ambulation   Ambulation assist   Ambulation activity did not occur: Safety/medical concerns (dizziness, pain, decreased balance)  Assist level: Minimal Assistance - Patient > 75% Assistive device: Walker-rolling Max distance: 38   Walk 10 feet activity   Assist  Walk 10 feet activity did not occur: Safety/medical concerns (dizziness, pain, decreased balance)  Assist level: Minimal Assistance - Patient > 75% Assistive device: Walker-rolling   Walk 50 feet activity   Assist Walk 50 feet with 2 turns activity did not occur: Safety/medical concerns         Walk 150 feet activity   Assist Walk 150 feet activity did not occur: Safety/medical concerns         Walk 10 feet on uneven surface  activity   Assist Walk 10 feet on uneven surfaces activity did not occur: Safety/medical concerns (dizziness, pain, decreased balance)         Wheelchair     Assist Is the patient using a wheelchair?: Yes Type of Wheelchair: Manual Wheelchair activity did not occur: Safety/medical concerns (dizziness, pain, decreased balance)         Wheelchair 50 feet with 2 turns activity    Assist    Wheelchair 50 feet with 2 turns activity did not occur: Safety/medical concerns (dizziness, pain, decreased balance)       Wheelchair 150 feet activity     Assist  Wheelchair 150 feet activity did not occur: Safety/medical concerns (dizziness, pain, decreased balance)       Blood pressure (!) 146/44,  pulse 60, temperature 97.9 F (36.6 C), temperature source Oral, resp. rate 18, height _0  (1.702 m), weight 77.1 kg, SpO2 91 %.  Medical Problem List and Plan: 1. Functional deficits secondary to left intertrochanteric femur fx after fall. Pt s/p IMN 09/25/22             -LLE WBAT             -patient may shower             -ELOS/Goals: 15-20 days  Continue CIR- PT and OT  Decrease to QD  Discussed plan to d/c to [facility] on Monday  Reviewed ortho follow-up  note 2.  Hematuria: d/c Eliquis. Continue plavix 31m daily 3. Pain: continue SL morphine PRN for pain. tylenol and hydrocodone prn for pain             -robaxin prn for muscle spasms  11/8- pain limiting therapy- doesn't want opiates- mainly using tylenol-   11/9- d/w pt- she's willing to try tramadol 50 mg q6 hours prn- she's scared of Coding if takes Norco- I explained that Tramadol is mild and shouldn't cause any cognitive issues.   11/10- pain much better- not resolved, but better with tramadol- con't tramadol and tylenol prn  11/14- pain still doing better with tramadol  11/17- is worse today since had surgery yesterday-doesn't want any other meds  11/18 reports pain controlled, continue tramadol PRN  - 11/25 Dced sedating medications, no current pain.   4. Insomnia: continue morphine SL HS 5. Neuropsych/cognition: This patient is capable of making decisions on her own behalf. 6. Skin/Wound Care: dry dressings to left ear and hip  11/9- having some drainage- from L hip- is improved this Am- will monitor closely.  7. Fluids/Electrolytes/Nutrition: encourage appropriate PO             -pt's intake has inconsistent so far  - Improved 100-35% 11/25  11/27- was told eating/drinking a little more.  8. Nausea:              -might be positional or med related.              -also having associated gas             -pt told me she's been moving bowels, however I don't see a bm recorded             -will try sorbitol today,  dulcolax suppository prn             -protonix 11/8- has ileus- per #15 below  11/9- still having nausea- will change diet to clear liquid per pt request- and monitor  11/10- on clear liquid diet- pt wants to wait 1 more day before changing to regular diet- if not better tomorrow, will need to call GI  11/14- nausea resolved- back on regular diet  11/16- nauseated again  111/7- Put on IVFs for another 24 hours at 75cc/hour since had surgery at 10-11pm at night and didn't get back from PACU until 1am- sh'e sleepy and not taking in much  11/18 LBM today, improved continue to monitor 11/20- LBM last night  11.21- checking KUB and ordered clear liquid diet- also put on IVFs, but holding until CXR back- since not drinking a lot  11/22- will stop IVFs- is drinking enough per labs- pt want sot wait to go back to regular until tomorrow  11/23- back to regular diet- with 1500cc fluid restriction  11/24- placed IVFs x 2 4 hours and recheck in AM  11/25 -NA, BUN, Cr stable on 75 cc/hr IVF; Dced HCTZ as would complicate further studies; recheck BMP in AM  11/27- Na 128- per hyponatremia 9. Left lower lobe consolidation             -pneumonia ruled out. Likely atelectasis             -IS, OOB             -wean oxygen to off  11/7- will write order to wean O2.   11/27- O2 0.5-1L O2 due to pneumonia 10. Sinus bradycardia:             -  on propranolol and verapamil at home             -continue to hold as HR in 50's  11/14- since still in 50s, will not restart  11. Essential HTN             -continue hctz  11/14- BP usually controlled but up today- will monitor for trend  11/15- somewhat elevated, but not sure if dropping- see #21           11/16- started hydralazine  11/17- just got hydralazine- is slightly better- might need it increased over weekend  -11/19 good control overall continue to monitor  11/20- somewhat variable- due to ABLA likely  11/22- doing better  11/25 - DC HCTZ d/t IVF,  hyponatremia as above - unable to give PO antihypertensives QHS Vitals:   10/23/22 1925 10/24/22 0303  BP: (!) 136/50 (!) 146/44  Pulse: 60 60  Resp: 16 18  Temp: 98 F (36.7 C) 97.9 F (36.6 C)  SpO2: 96% 91%    12. Right middle lobe nodule 2.3 cm- should be considered primary lung CA until ruled out- also has nodule as well 22m on R middle lung- could be mets per CT scan.              -f/u as outpt  11/7- d/w daughter- she said no f/u at this time- due to pt's age.   11/8- daughter doesn't want to let pt know about tumors.    13. Mild leukocytosis- 11.2   11/6- afebrile- other than difficulty swallowing- says it's dry?; denies UTI Sx's- if gets worse clinically, will recheck in AM- right now scheduled for Wed AM.   11/8- WBC down to 9.4-  14. Constipation  11/6- LBM on day of fall per pt and chart- will give Sorbitol after therapy and follow with SSE if needed.   11/7- had multiple BM's- didn't need SSE- per notes- feels cleaned out   11/19 LBM today, improved  11/27- LBM last night 15. ABLA  11/6- Down to 7.6- will recheck Wednesday to make sure not dropping further- and transfuse if required.   11/8- Hb 7.3- on edge of needing transfusion.   11/9- Hb up to 8.0-   11/10- Hb 7.7- will con't to monitor closely.   11/15- Hb 8.4  11/20- Hb 6.4- will transfuse as below  11/25 - HgB stable ~9, however with ongoing gross hematuria; holding PM eliqius, continue plavix  16. Post op ileus resolved BM x 3 11/10, 1 BM 11/12- advance diet as tolerated   -Recheck level tomorrow  11/21- New n/V-- ordered another KUB to look for ileus and put back on clear liquid diet- also ordered IV compazine, PO/PR phenergan and d/c'd Zofran since can cause constipation  11/22- wants ot wait til tomorrow to go back to regular diet- but can be changed anytime pt wants to back to regular diet  11/27- was back to regular diet- eating slightly better 17. Thrush?  11/9- will add Magic mouthwash- don't see  plaques, but tongue dry and furrowed. Says things taste 'wierd".   11/10- said magic mouthwash helping Sx's.  18. Leukocytosis improved   11/15- WBC down to 7.8  11/16- will recheck today to make sure not elevated  11/17- WBC stable at 8k  Recheck tomorrow  11/20- WBC down to 6.8  11/23- CRP 7.6- and ESR 21- doing great  11/25 - inc 13 from 10; allow 24 hours on new abx regimen; recheck in AM.  Ucx NGTD, likely etiologies PNA vs. Ongoing L hip infection - both should be covered on daptomycin and rocephin  11/27- WBC back down to 9k- - ID also helping today due to questionable worsening pneumonia on CXR    Latest Ref Rng & Units 10/20/2022    4:30 AM 10/19/2022    4:19 AM 10/18/2022    3:24 AM  CBC  WBC 4.0 - 10.5 K/uL 9.7  11.6  13.6   Hemoglobin 12.0 - 15.0 g/dL 9.5  9.4  9.4   Hematocrit 36.0 - 46.0 % 28.8  27.2  27.9   Platelets 150 - 400 K/uL 301  296  298     19. Hypokalemia and low Mg  11/10- repleted up to 3.5- will put on Kcl 10 mEq daily for now and recheck Monday- since K+ still  3.5- will also check Mg Monday as well   11/14= Mg 1.6 and K+ 3.1- repleted again  yesterday by pharmacy for [place]- will recheck labs iN AM  11/15- K+ 4.2- will give IV Mg 2G for Mg 1.6- and recheck in AM  11/16- Mg 1.7- still low- will put on PO Mg 400 mg BID  Recheck K+ and MG tomorrow  11/20- K+ 3.8 and Mg 1.8- will con't regimen  11/22- K+ 3.4- repleted again IV- repleted yesterday IV since was 3.0-   11/23- KCL- will replete since K+ 3.2 today- will give 40 mEq x2 and recheck in AM- will also change daily KCL to 20 mEq- Mg level 1.7  11/24- K+ 3.6  11/26 - K 3.8; monitor  20. Swallowing issues  11/14- appears to be thrush- will start Diflucan- due to renal issues, will need to be 50 mg daily- x 7 days doses-   11/15- reports things taste better and was able ot swallow some this AM of breakfast-con't diflucan  11/19 reports improved, continue to monitor  11/23- resolved- Diflucan  finished  21. Dizziness/lightheadedness/presyncope  11/15- will have pt get orthostatics checked by nursing and therapy- BP on high side, so not sure if Sx's due to BP being high or dropping.   11/16- added hydralazine 25 mg TID- is somewhat better today  11/20- BP a little on low side- likely due to ABLA- will hold BP meds if need be.  22. Infection of L hip?  11/16- still having drainage from L hip- spoke to Ortho- they are taking her to OR today at 5pm for wash out-   11/17- Gram stains negative so far- waiting for Cx's- surgeon gave Ancef x2 IV and will monitor Cultures  11/19 wound culture growing staph,Continue vancomycin, surgery advising ID consult, called ID to discuss -consult placed  11/20- con't IV Vanc per ID- Cx's still pending final. 11/21- changed to Daptomycin, per ID- will double check labs today- CK is pending- didn't get done yesterday- will check labs today as well  11/22- CK looking good- 23- con't Daptomycin- stop date is 11/07/22 per ID  23. Urinary retention  11/16- is new- will check U/A and Cx- and labs- CBC- with diff and CMP just to make sure no other issues  11/17- U/A (-) and doesn't have elevated WBC- mild left shift however- will start Flomax 0.4 mg q supper  11/8 continent PVRs a little improved today< 364m, continue to monitor, continue flomax, IC if > 350 ml  11/23- placed foley since having such high volumes and will leave in til early next week- will try voiding trial Monday/Tuesday- will also increase Flomax  to 0.8 q supper  11/24- will treat possible UTI- and possible pneumonia- WBC up slightly to 10.6 with L shift  11/25: See above; ongoing hematuria w/o growth on Ucx; ? UTI  24. ABLA anemia-  11/20- down to 6.4- will transfuse 2 units since so low.    11/21- labs today- pending  11/22- Hb 9.7  11/25: Held QHS eliquis 2.5 for gross hematuria; cbc in AM  25. SOB/questionable pneumonia/bibasilar opacities  11/21- pt c/o SOB- O2 sats 98% on 2L- has  been on O2 since surgery last week- will check CXR and gave IV Lasix 40 mg x1 - and let pt know we need her to void. If CXR Negative, will need CTA of chest to look for PE.   11/22- down to 0.5 L of O2- and satting well- actually CO2 was too high on 2L- so reduced due to CO2 retention- SOB improved- of note, no leukocytosis  11/23- off O2 completely- and satting well  11/24- more SOB- still has Bibasilar opacities- hadn't treated earlier because lack of elevated WBC and afebrile- will treat now- also treat possible UTI per IM with Rocephin 1G q24 hours and wait for U Cx.   - 11/25: See above; if WBC continues uptrend in AM despite broad IV abx and IVF, will get Bcx 2 and re-engage IM, ID for infectious workup, hyponatremia, and hematuria  11/27- WBC down to 9.7, however CXR looks worse- on Rocephin- have asked ID to come take a look at pt- they will come by today.   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 Ceftriaxone completed for pneumonia treatment. Discussed this with daughter.  26. Hyponatremia  Na reviewed and improved to 136. Continue fluid restriction as per nephro recommendations. Do not resume HCTZ, discussed with daughter.  27. Bradycardia: continue to monitor HR TID, resolved 28. Bilateral lower extremity edema: continue lasix IV as per nephro recommendations.   LOS: 26 days A FACE TO FACE EVALUATION WAS PERFORMED  Martha Clan P Maxi Rodas 10/24/2022, 11:27 AM

## 2022-10-25 DIAGNOSIS — E871 Hypo-osmolality and hyponatremia: Secondary | ICD-10-CM

## 2022-10-25 LAB — BASIC METABOLIC PANEL
Anion gap: 8 (ref 5–15)
BUN: 16 mg/dL (ref 8–23)
CO2: 39 mmol/L — ABNORMAL HIGH (ref 22–32)
Calcium: 8.7 mg/dL — ABNORMAL LOW (ref 8.9–10.3)
Chloride: 91 mmol/L — ABNORMAL LOW (ref 98–111)
Creatinine, Ser: 0.59 mg/dL (ref 0.44–1.00)
GFR, Estimated: >60 mL/min (ref >60)
Glucose, Bld: 112 mg/dL — ABNORMAL HIGH (ref 70–99)
Potassium: 3.4 mmol/L — ABNORMAL LOW (ref 3.5–5.1)
Sodium: 138 mmol/L (ref 135–145)

## 2022-10-25 MED ORDER — FUROSEMIDE 10 MG/ML IJ SOLN
60.0000 mg | Freq: Once | INTRAMUSCULAR | Status: AC
  Administered 2022-10-25: 60 mg via INTRAVENOUS
  Filled 2022-10-25: qty 6

## 2022-10-25 MED ORDER — POTASSIUM CHLORIDE CRYS ER 20 MEQ PO TBCR
20.0000 meq | EXTENDED_RELEASE_TABLET | Freq: Every day | ORAL | Status: DC
  Administered 2022-10-26 – 2022-10-27 (×2): 20 meq via ORAL
  Filled 2022-10-25 (×2): qty 1

## 2022-10-25 NOTE — Progress Notes (Signed)
PROGRESS NOTE   Subjective/Complaints: No new issues overnight. Sleeping better at night.   ROS: Limited due to cognitive/behavioral    Objective:   No results found. No results for input(s): "WBC", "HGB", "HCT", "PLT" in the last 72 hours.   Recent Labs    10/24/22 0316 10/25/22 0309  NA 135 138  K 3.0* 3.4*  CL 85* 91*  CO2 37* 39*  GLUCOSE 115* 112*  BUN 15 16  CREATININE 0.65 0.59  CALCIUM 8.6* 8.7*    Intake/Output Summary (Last 24 hours) at 10/25/2022 1165 Last data filed at 10/25/2022 0427 Gross per 24 hour  Intake 267 ml  Output 1100 ml  Net -833 ml     Pressure Injury 10/16/22 Ischial tuberosity Right Stage 2 -  Partial thickness loss of dermis presenting as a shallow open injury with a red, pink wound bed without slough. (Active)  10/16/22 0900  Location: Ischial tuberosity  Location Orientation: Right  Staging: Stage 2 -  Partial thickness loss of dermis presenting as a shallow open injury with a red, pink wound bed without slough.  Wound Description (Comments):   Present on Admission: No     Pressure Injury 10/16/22 Right Stage 2 -  Partial thickness loss of dermis presenting as a shallow open injury with a red, pink wound bed without slough. (Active)  10/16/22 0900  Location:   Location Orientation: Right  Staging: Stage 2 -  Partial thickness loss of dermis presenting as a shallow open injury with a red, pink wound bed without slough.  Wound Description (Comments):   Present on Admission: No    Physical Exam: Vital Signs Blood pressure (!) 148/50, pulse (!) 53, temperature 97.6 F (36.4 C), temperature source Oral, resp. rate 18, height _0  (1.702 m), weight 77.1 kg, SpO2 98 %. Constitutional: slow to arouse Vital signs reviewed. HEENT: NCAT, EOMI, oral membranes moist Neck: supple Cardiovascular: brady without murmur. No JVD    Respiratory/Chest: CTA Bilaterally without wheezes or  rales. Normal effort    GI/Abdomen: BS +, non-tender, non-distended Ext: no clubbing, cyanosis, or edema Psych: flat   Musculoskeletal:     Cervical back: Normal range of motion.     Comments: Nontender on palpation of bilateral hips, thighs on exam Skin: L hip less swelling; dressing C/D/I    General: Skin is warm.     Findings: Bruising (arms) present. No signs of ecchymosis or diffuse bruising.     Comments: Left hip incision CDI, dressing dry  Neurological: confused, nonverbal but follows some simple commands    Cranial Nerves: No cranial nerve deficit.     Comments: Motor: 4-5/5 Ue's. Sitting upright at EOB with Mod A.   Assessment/Plan: 1. Functional deficits which require 3+ hours per day of interdisciplinary therapy in a comprehensive inpatient rehab setting. Physiatrist is providing close team supervision and 24 hour management of active medical problems listed below. Physiatrist and rehab team continue to assess barriers to discharge/monitor patient progress toward functional and medical goals  Care Tool:  Bathing    Body parts bathed by patient: Face, Abdomen, Chest   Body parts bathed by helper: Right arm, Left arm, Front perineal area,  Buttocks, Right upper leg, Left upper leg, Right lower leg, Left lower leg     Bathing assist Assist Level: Maximal Assistance - Patient 24 - 49%     Upper Body Dressing/Undressing Upper body dressing   What is the patient wearing?: Hospital gown only    Upper body assist Assist Level: Maximal Assistance - Patient 25 - 49%    Lower Body Dressing/Undressing Lower body dressing      What is the patient wearing?: Hospital gown only     Lower body assist Assist for lower body dressing: Maximal Assistance - Patient 25 - 49%     Toileting Toileting    Toileting assist Assist for toileting: Dependent - Patient 0%     Transfers Chair/bed transfer  Transfers assist  Chair/bed transfer activity did not occur:  Safety/medical concerns (dizziness, pain, decreased balance)  Chair/bed transfer assist level: Minimal Assistance - Patient > 75%     Locomotion Ambulation   Ambulation assist   Ambulation activity did not occur: Safety/medical concerns (dizziness, pain, decreased balance)  Assist level: Minimal Assistance - Patient > 75% Assistive device: Walker-rolling Max distance: 38   Walk 10 feet activity   Assist  Walk 10 feet activity did not occur: Safety/medical concerns (dizziness, pain, decreased balance)  Assist level: Minimal Assistance - Patient > 75% Assistive device: Walker-rolling   Walk 50 feet activity   Assist Walk 50 feet with 2 turns activity did not occur: Safety/medical concerns         Walk 150 feet activity   Assist Walk 150 feet activity did not occur: Safety/medical concerns         Walk 10 feet on uneven surface  activity   Assist Walk 10 feet on uneven surfaces activity did not occur: Safety/medical concerns (dizziness, pain, decreased balance)         Wheelchair     Assist Is the patient using a wheelchair?: Yes Type of Wheelchair: Manual Wheelchair activity did not occur: Safety/medical concerns (dizziness, pain, decreased balance)         Wheelchair 50 feet with 2 turns activity    Assist    Wheelchair 50 feet with 2 turns activity did not occur: Safety/medical concerns (dizziness, pain, decreased balance)       Wheelchair 150 feet activity     Assist  Wheelchair 150 feet activity did not occur: Safety/medical concerns (dizziness, pain, decreased balance)       Blood pressure (!) 148/50, pulse (!) 53, temperature 97.6 F (36.4 C), temperature source Oral, resp. rate 18, height _0  (1.702 m), weight 77.1 kg, SpO2 98 %.  Medical Problem List and Plan: 1. Functional deficits secondary to left intertrochanteric femur fx after fall. Pt s/p IMN 09/25/22             -LLE WBAT             -patient may shower              -ELOS/Goals: 15-20 days  Continue CIR- PT and OT  Decrease to QD  -d/c to [facility] on Monday  Reviewed ortho follow-up note 2.  Hematuria: d/c Eliquis. Continue plavix 43m daily 3. Pain: continue SL morphine PRN for pain. tylenol and hydrocodone prn for pain             -robaxin prn for muscle spasms  11/8- pain limiting therapy- doesn't want opiates- mainly using tylenol-   11/9- d/w pt- she's willing to try tramadol 50 mg q6 hours  prn- she's scared of Coding if takes Norco- I explained that Tramadol is mild and shouldn't cause any cognitive issues.   11/10- pain much better- not resolved, but better with tramadol- con't tramadol and tylenol prn  11/14- pain still doing better with tramadol  11/17- is worse today since had surgery yesterday-doesn't want any other meds  11/18 reports pain controlled, continue tramadol PRN  - 11/25 Dced sedating medications, no current pain.   12/2 appears comfortable this morning 4. Insomnia: continue morphine SL HS 5. Neuropsych/cognition: This patient is capable of making decisions on her own behalf. 6. Skin/Wound Care: dry dressings to left ear and hip  11/9- having some drainage- from L hip- is improved this Am- will monitor closely.  7. Fluids/Electrolytes/Nutrition: encourage appropriate PO             -pt's intake has inconsistent so far  - Improved 100-35% 11/25  12/2 intake variable, suboptimal  8. Nausea:              -might be positional or med related.              -also having associated gas             -pt told me she's been moving bowels, however I don't see a bm recorded             -will try sorbitol today, dulcolax suppository prn             -protonix 11/8- has ileus- per #15 below  11/9- still having nausea- will change diet to clear liquid per pt request- and monitor  11/10- on clear liquid diet- pt wants to wait 1 more day before changing to regular diet- if not better tomorrow, will need to call GI  11/14- nausea  resolved- back on regular diet  11/16- nauseated again  111/7- Put on IVFs for another 24 hours at 75cc/hour since had surgery at 10-11pm at night and didn't get back from PACU until 1am- sh'e sleepy and not taking in much  11/18 LBM today, improved continue to monitor 11/20- LBM last night  11.21- checking KUB and ordered clear liquid diet- also put on IVFs, but holding until CXR back- since not drinking a lot  11/22- will stop IVFs- is drinking enough per labs- pt want sot wait to go back to regular until tomorrow  11/23- back to regular diet- with 1500cc fluid restriction  11/24- placed IVFs x 2 4 hours and recheck in AM  11/25 -NA, BUN, Cr stable on 75 cc/hr IVF; Dced HCTZ as would complicate further studies; recheck BMP in AM  12/2 Na+ 138--do not resume hctx, continue FR 9. Left lower lobe consolidation             -pneumonia ruled out. Likely atelectasis             -IS, OOB             -wean oxygen to off  11/7- will write order to wean O2.   11/27- O2 0.5-1L O2 due to pneumonia 10. Sinus bradycardia:             -on propranolol and verapamil at home             -continue to hold as HR in 50's  11/14- since still in 50s, will not restart  11. Essential HTN             -continue hctz  11/14- BP usually  controlled but up today- will monitor for trend  11/15- somewhat elevated, but not sure if dropping- see #21           11/16- started hydralazine  11/17- just got hydralazine- is slightly better- might need it increased over weekend  -11/19 good control overall continue to monitor  11/20- somewhat variable- due to ABLA likely  11/22- doing better  11/25 - DC HCTZ d/t IVF, hyponatremia as above - unable to give PO antihypertensives QHS Vitals:   10/25/22 0427 10/25/22 0913  BP: (!) 114/50 (!) 148/50  Pulse: (!) 53 (!) 53  Resp: 18   Temp: 97.6 F (36.4 C)   SpO2: 98%     12. Right middle lobe nodule 2.3 cm- should be considered primary lung CA until ruled out- also has  nodule as well 28m on R middle lung- could be mets per CT scan.              -f/u as outpt  11/7- d/w daughter- she said no f/u at this time- due to pt's age.   11/8- daughter doesn't want to let pt know about tumors.    13. Mild leukocytosis- 11.2   11/6- afebrile- other than difficulty swallowing- says it's dry?; denies UTI Sx's- if gets worse clinically, will recheck in AM- right now scheduled for Wed AM.   11/8- WBC down to 9.4-  14. Constipation  11/6- LBM on day of fall per pt and chart- will give Sorbitol after therapy and follow with SSE if needed.   11/7- had multiple BM's- didn't need SSE- per notes- feels cleaned out   11/19 LBM today, improved  12/2 bm this morning 15. ABLA  11/6- Down to 7.6- will recheck Wednesday to make sure not dropping further- and transfuse if required.   11/8- Hb 7.3- on edge of needing transfusion.   11/9- Hb up to 8.0-   11/10- Hb 7.7- will con't to monitor closely.   11/15- Hb 8.4  11/20- Hb 6.4- will transfuse as below  11/25 - HgB stable ~9, however with ongoing gross hematuria; holding PM eliqius, continue plavix  16. Post op ileus resolved BM x 3 11/10, 1 BM 11/12- advance diet as tolerated   -Recheck level tomorrow  11/21- New n/V-- ordered another KUB to look for ileus and put back on clear liquid diet- also ordered IV compazine, PO/PR phenergan and d/c'd Zofran since can cause constipation  11/22- wants ot wait til tomorrow to go back to regular diet- but can be changed anytime pt wants to back to regular diet  11/27- was back to regular diet- eating slightly better 17. Thrush?  11/9- will add Magic mouthwash- don't see plaques, but tongue dry and furrowed. Says things taste 'wierd".   11/10- said magic mouthwash helping Sx's.  18. Leukocytosis improved   11/15- WBC down to 7.8  11/16- will recheck today to make sure not elevated  11/17- WBC stable at 8k  Recheck tomorrow  11/20- WBC down to 6.8  11/23- CRP 7.6- and ESR 21- doing  great  11/25 - inc 13 from 10; allow 24 hours on new abx regimen; recheck in AM. Ucx NGTD, likely etiologies PNA vs. Ongoing L hip infection - both should be covered on daptomycin and rocephin  11/27- WBC back down to 9k- - ID also helping today due to questionable worsening pneumonia on CXR    Latest Ref Rng & Units 10/20/2022    4:30 AM 10/19/2022    4:19  AM 10/18/2022    3:24 AM  CBC  WBC 4.0 - 10.5 K/uL 9.7  11.6  13.6   Hemoglobin 12.0 - 15.0 g/dL 9.5  9.4  9.4   Hematocrit 36.0 - 46.0 % 28.8  27.2  27.9   Platelets 150 - 400 K/uL 301  296  298     19. Hypokalemia and low Mg  11/10- repleted up to 3.5- will put on Kcl 10 mEq daily for now and recheck Monday- since K+ still  3.5- will also check Mg Monday as well   11/14= Mg 1.6 and K+ 3.1- repleted again  yesterday by pharmacy for [place]- will recheck labs iN AM  11/15- K+ 4.2- will give IV Mg 2G for Mg 1.6- and recheck in AM  11/16- Mg 1.7- still low- will put on PO Mg 400 mg BID  Recheck K+ and MG tomorrow  11/20- K+ 3.8 and Mg 1.8- will con't regimen  11/22- K+ 3.4- repleted again IV- repleted yesterday IV since was 3.0-   11/23- KCL- will replete since K+ 3.2 today- will give 40 mEq x2 and recheck in AM- will also change daily KCL to 20 mEq- Mg level 1.7  11/24- K+ 3.6  12/2 K+ 3.4===>continue supp  20. Swallowing issues  11/14- appears to be thrush- will start Diflucan- due to renal issues, will need to be 50 mg daily- x 7 days doses-   11/15- reports things taste better and was able ot swallow some this AM of breakfast-con't diflucan  11/19 reports improved, continue to monitor  11/23- resolved- Diflucan finished  21. Dizziness/lightheadedness/presyncope  11/15- will have pt get orthostatics checked by nursing and therapy- BP on high side, so not sure if Sx's due to BP being high or dropping.   11/16- added hydralazine 25 mg TID- is somewhat better today  11/20- BP a little on low side- likely due to ABLA- will hold BP  meds if need be.  22. Infection of L hip?  11/16- still having drainage from L hip- spoke to Ortho- they are taking her to OR today at 5pm for wash out-   11/17- Gram stains negative so far- waiting for Cx's- surgeon gave Ancef x2 IV and will monitor Cultures  11/19 wound culture growing staph,Continue vancomycin, surgery advising ID consult, called ID to discuss -consult placed  11/20- con't IV Vanc per ID- Cx's still pending final. 11/21- changed to Daptomycin, per ID- will double check labs today- CK is pending- didn't get done yesterday- will check labs today as well  11/22- CK looking good- 23- con't Daptomycin- stop date is 11/07/22 per ID  23. Urinary retention  11/16- is new- will check U/A and Cx- and labs- CBC- with diff and CMP just to make sure no other issues  11/17- U/A (-) and doesn't have elevated WBC- mild left shift however- will start Flomax 0.4 mg q supper  11/8 continent PVRs a little improved today< 356m, continue to monitor, continue flomax, IC if > 350 ml  11/23- placed foley since having such high volumes and will leave in til early next week- will try voiding trial Monday/Tuesday- will also increase Flomax to 0.8 q supper  11/24- will treat possible UTI- and possible pneumonia- WBC up slightly to 10.6 with L shift  11/25: See above; ongoing hematuria w/o growth on Ucx; ? UTI  24. ABLA anemia-  11/20- down to 6.4- will transfuse 2 units since so low.    11/21- labs today- pending  11/22-  Hb 9.7  11/25: Held QHS eliquis 2.5 for gross hematuria; cbc in AM  25. SOB/questionable pneumonia/bibasilar opacities  11/21- pt c/o SOB- O2 sats 98% on 2L- has been on O2 since surgery last week- will check CXR and gave IV Lasix 40 mg x1 - and let pt know we need her to void. If CXR Negative, will need CTA of chest to look for PE.   11/22- down to 0.5 L of O2- and satting well- actually CO2 was too high on 2L- so reduced due to CO2 retention- SOB improved- of note, no  leukocytosis  11/23- off O2 completely- and satting well  11/24- more SOB- still has Bibasilar opacities- hadn't treated earlier because lack of elevated WBC and afebrile- will treat now- also treat possible UTI per IM with Rocephin 1G q24 hours and wait for U Cx.   - 11/25: See above; if WBC continues uptrend in AM despite broad IV abx and IVF, will get Bcx 2 and re-engage IM, ID for infectious workup, hyponatremia, and hematuria  11/27- WBC down to 9.7, however CXR looks worse- on Rocephin- have asked ID to come take a look at pt- they will come by today.   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 Ceftriaxone completed for pneumonia treatment. Discussed this with daughter.  27. Bradycardia: continue to monitor HR TID, resolved 28. Bilateral lower extremity edema: continue lasix IV as per nephro recommendations.   LOS: 27 days A FACE TO FACE EVALUATION WAS PERFORMED  Meredith Staggers 10/25/2022, 9:28 AM

## 2022-10-25 NOTE — Plan of Care (Signed)
  Problem: Consults Goal: RH GENERAL PATIENT EDUCATION Description: See Patient Education module for education specifics. Outcome: Not Progressing   Problem: RH BOWEL ELIMINATION Goal: RH STG MANAGE BOWEL WITH ASSISTANCE Description: STG Manage Bowel with mod i Assistance. Outcome: Not Progressing Goal: RH STG MANAGE BOWEL W/MEDICATION W/ASSISTANCE Description: STG Manage Bowel with Medication with mod I  Assistance. Outcome: Not Progressing   Problem: RH BLADDER ELIMINATION Goal: RH STG MANAGE BLADDER WITH ASSISTANCE Description: STG Manage Bladder With toileting Assistance Outcome: Not Progressing   Problem: RH SAFETY Goal: RH STG ADHERE TO SAFETY PRECAUTIONS W/ASSISTANCE/DEVICE Description: STG Adhere to Safety Precautions With Assistance/Device. Outcome: Not Progressing   Problem: RH PAIN MANAGEMENT Goal: RH STG PAIN MANAGED AT OR BELOW PT'S PAIN GOAL Description: < 4 with prns Outcome: Not Progressing   Problem: RH KNOWLEDGE DEFICIT GENERAL Goal: RH STG INCREASE KNOWLEDGE OF SELF CARE AFTER HOSPITALIZATION Description: Patient and family will be able to manage care at discharge using educational handouts independently Outcome: Not Progressing   Problem: Education: Goal: Knowledge of General Education information will improve Description: Including pain rating scale, medication(s)/side effects and non-pharmacologic comfort measures Outcome: Not Progressing   Problem: Health Behavior/Discharge Planning: Goal: Ability to manage health-related needs will improve Outcome: Not Progressing   Problem: Clinical Measurements: Goal: Ability to maintain clinical measurements within normal limits will improve Outcome: Not Progressing Goal: Will remain free from infection Outcome: Not Progressing Goal: Diagnostic test results will improve Outcome: Not Progressing Goal: Respiratory complications will improve Outcome: Not Progressing Goal: Cardiovascular complication will be  avoided Outcome: Not Progressing   Problem: Activity: Goal: Risk for activity intolerance will decrease Outcome: Not Progressing   Problem: Nutrition: Goal: Adequate nutrition will be maintained Outcome: Not Progressing   Problem: Coping: Goal: Level of anxiety will decrease Outcome: Not Progressing   Problem: Elimination: Goal: Will not experience complications related to bowel motility Outcome: Not Progressing Goal: Will not experience complications related to urinary retention Outcome: Not Progressing   Problem: Pain Managment: Goal: General experience of comfort will improve Outcome: Not Progressing   Problem: Safety: Goal: Ability to remain free from injury will improve Outcome: Not Progressing

## 2022-10-25 NOTE — Progress Notes (Signed)
[facility] Progress Note  Name: Anna Weiss MRN: [id number] DOB: 1925/01/18  Chief Complaint:  Fall at home  Subjective:  She had 2.8 liters of UOP charted over 12/1 as well as an unmeasured void.  Family has stated that prior to the foley they had to do frequent in/out caths after her 2nd surgery.  She is to be discharged on Monday she thinks   Review of systems:   Shortness of breath is improved  No n/v Swelling is down  No chest pain  -----------------  Background on consult:  Anna Weiss is a 88 y.o. female with a history of HTN, CVA, and HLD who presented to the hospital after a fall at home on 09/24/22.  She sustained a femur fracture and is s/p repair on 09/25/22.  She also has concern for PNA but this was later felt to be atelectasis.  She was transferred to [facility] inpatient rehab for deconditioning.  Nephrology is consulted for assistance with management of hyponatremia.  She was previously on HCTZ which has been stopped with last dose on 11/25.  She got lasix on 11/21 but hasn't since.  She has been on IV fluids for several days - stopped on 11/26 per charting.  She states that her breathing has been "hard".  She has been on low rate of oxygen here but not normally on oxygen at home.  She feels swollen.      Intake/Output Summary (Last 24 hours) at 10/25/2022 1715 Last data filed at 10/25/2022 1637 Gross per 24 hour  Intake 110 ml  Output 1350 ml  Net -1240 ml    Vitals:  Vitals:   10/25/22 0506 10/25/22 0913 10/25/22 1319 10/25/22 1548  BP:  (!) 148/50 138/69 (!) 148/50  Pulse:  (!) 53 60   Resp:   16   Temp:   98.6 F (37 C)   TempSrc:      SpO2:   100%   Weight: 77.1 kg     Height:         Physical Exam:    General:  elderly female in bed, weak   HEENT: NCAT Eyes: EOMI sclera anicteric Neck: supple trachea midline  Heart: S1S2 no rub Lungs: clear and reduced; unlabored on oxygen  Abdomen: soft/nt/nd Extremities: 1+ edema lower extremities   Skin: no rash on extremities exposed  Psych no anxiety or agitation  Neuro: awake and oriented and answers questions on exam  Medications reviewed   Labs:     Latest Ref Rng & Units 10/25/2022    3:09 AM 10/24/2022    3:16 AM 10/23/2022    4:29 AM  BMP  Glucose 70 - 99 mg/dL 112  115  106   BUN 8 - 23 mg/dL '16  15  12   '$ Creatinine 0.44 - 1.00 mg/dL 0.59  0.65  0.57   Sodium 135 - 145 mmol/L 138  135  136   Potassium 3.5 - 5.1 mmol/L 3.4  3.0  3.1   Chloride 98 - 111 mmol/L 91  85  88   CO2 22 - 32 mmol/L 39  37  38   Calcium 8.9 - 10.3 mg/dL 8.7  8.6  8.9      Assessment/Plan:   # Hyponatremia - Setting of HCTZ use as well as fluids.  On levothyroxine with acceptable dose  - Resolved  - lasix 60 mg IV today       - Please do not resume HCTZ.  If  a diuretic is needed she would need lasix.  Try lasix 20-40 mg dose if a diuretic is needed.  # HTN  - improving  - Please do not resume HCTZ    # Hypokalemia  - potassium 40 meq BID for today - would start potassium 20 meq PO daily  # Hypothyroidism  - on levothyroxine  - acceptable TSH on last check    # Macrocytic anemia - Improved overall; b12 and folate ok - She is iron deficient  - on oral iron supplement   # Fall with femur fracture - s/p repair per ortho - appreciate rehab   # Deconditioning - per inpatient rehab team  Nephrology will sign off.  Disposition per primary team.  Per her daughter's report she is to be discharged on 12/4, Monday to SNF.  Would recommend an assessment with SNF MD or extender within 48-72 hours of arrival and repeat BMP within one week to determine if potassium should be stopped or adjusted.  Claudia Desanctis, MD 10/25/2022 5:30 PM

## 2022-10-26 LAB — BASIC METABOLIC PANEL
Anion gap: 8 (ref 5–15)
BUN: 20 mg/dL (ref 8–23)
CO2: 37 mmol/L — ABNORMAL HIGH (ref 22–32)
Calcium: 8.6 mg/dL — ABNORMAL LOW (ref 8.9–10.3)
Chloride: 94 mmol/L — ABNORMAL LOW (ref 98–111)
Creatinine, Ser: 0.61 mg/dL (ref 0.44–1.00)
GFR, Estimated: >60 mL/min (ref >60)
Glucose, Bld: 107 mg/dL — ABNORMAL HIGH (ref 70–99)
Potassium: 4 mmol/L (ref 3.5–5.1)
Sodium: 139 mmol/L (ref 135–145)

## 2022-10-26 MED ORDER — FAMOTIDINE 40 MG/5ML PO SUSR
40.0000 mg | Freq: Two times a day (BID) | ORAL | Status: DC
  Administered 2022-10-26 – 2022-10-28 (×4): 40 mg
  Filled 2022-10-26 (×6): qty 5

## 2022-10-26 NOTE — Progress Notes (Signed)
Occupational Therapy Discharge Summary  Patient Details  Name: Anna Weiss MRN: [id number] Date of Birth: 12-Jul-1925  Date of Discharge from [facility] service:October 26, 2022  Today's Date: 10/26/2022 OT Individual Time: 1345-1430 OT Individual Time Calculation (min): 45 min    Patient has met 0 of 12 long term goals due to change in medical status after developing a wound infection and needing L hip I & D. Further complications with SOB, edema, confusion, lethargy and weakness persisted and pt was on medical hold until late last week. 1 st OOB was this past Friday and today however pt continues to require max A overall for transfers and LB selfcare.  Patient to discharge at [facility] Max Assist level.  Patient's care partner is independent to provide the necessary physical and cognitive assistance at discharge as pty being discharged to a skilled nursing facility for further therapy progression.   Reasons goals not met: see above  Recommendation:  Patient will benefit from ongoing skilled OT services in skilled nursing facility setting to continue to advance functional skills in the area of BADL, iADL, and Reduce care partner burden.  Equipment: TBD at next facility based on needs and functional level.   Reasons for discharge: lack of progress toward goals and discharge from hospital  Patient/family agrees with progress made and goals achieved: Yes  OT Discharge Precautions/Restrictions  Precautions Precautions: Fall;Other (comment) Precaution Comments: monitor O2, orthostatics Restrictions Weight Bearing Restrictions: Yes RLE Weight Bearing: Weight bearing as tolerated LLE Weight Bearing: Weight bearing as tolerated General   Vital Signs Therapy Vitals Temp: 97.9 F (36.6 C) Pulse Rate: 60 Resp: 18 BP: (!) 137/57 Patient Position (if appropriate): Lying Oxygen Therapy SpO2: 99 % O2 Device: Room Air Pain Pain Assessment Pain Scale: 0-10 Pain Score: 0-No  pain ADL ADL Eating: Set up Where Assessed-Eating: Bed level Grooming: Minimal assistance Where Assessed-Grooming: Bed level, Chair Upper Body Bathing: Minimal assistance Where Assessed-Upper Body Bathing: Bed level Lower Body Bathing: Maximal assistance Where Assessed-Lower Body Bathing: Chair, Bed level Upper Body Dressing: Minimal assistance Where Assessed-Upper Body Dressing: Bed level, Chair Lower Body Dressing: Maximal assistance Where Assessed-Lower Body Dressing: Edge of bed, Bed level Toileting: Maximal assistance Where Assessed-Toileting: Bedside Commode, Bed level Toilet Transfer: Maximal assistance Toilet Transfer Method: Squat pivot, Other (comment) (Stedy lift) Science writer: Radiographer, therapeutic: Unable to assess Social research officer, government: Maximal assistance Social research officer, government Method: Radiographer, therapeutic: Shower seat with back ADL Comments: Pt with a significant decline in function due to change in medical condition post 2nd I&D surgery. Now modified squat pivot with max A or Stedy transfer with nursing staff x 2 for safety. Vision Baseline Vision/History: 1 Wears glasses Patient Visual Report: No change from baseline Vision Assessment?: Yes Eye Alignment: Within Functional Limits Ocular Range of Motion: Within Functional Limits Alignment/Gaze Preference: Within Defined Limits Visual Fields: No apparent deficits Additional Comments: light sensitivity Perception  Perception: Within Functional Limits Praxis Praxis: Intact Cognition Cognition Overall Cognitive Status: Within Functional Limits for tasks assessed Arousal/Alertness: Awake/alert Orientation Level: Person;Place;Situation Person: Oriented Place: Oriented Situation: Oriented Memory: Impaired Attention: Sustained Sustained Attention: Appears intact Awareness: Appears intact Problem Solving: Impaired Safety/Judgment: Appears intact Brief Interview  for Mental Status (BIMS) Repetition of Three Words (First Attempt): 3 Temporal Orientation: Year: Correct Temporal Orientation: Month: Accurate within 5 days Temporal Orientation: Day: Correct Recall: "Sock": Yes, no cue required Recall: "Blue": Yes, no cue required Recall: "Bed": Yes, no cue required BIMS Summary Score: 15  Sensation Sensation Light Touch: Appears Intact Hot/Cold: Appears Intact Proprioception: Appears Intact Stereognosis: Appears Intact Coordination Gross Motor Movements are Fluid and Coordinated: No Fine Motor Movements are Fluid and Coordinated: No Coordination and Movement Description: limited by pain and generalized weakness/deconditioning Finger Nose Finger Test: delyaed and intention tremor at end range bilaterally Heel Shin Test: WFL R LE, delayed and limited ROM L LE 9 Hole Peg Test: NT Motor  Motor Motor: Other (comment) Motor - Skilled Clinical Observations: generalized weakness and limited due to pain, decreased activity tolerance, balance/coordination deficits Mobility  Bed Mobility Bed Mobility: Rolling Right;Rolling Left Rolling Right: Moderate Assistance - Patient 50-74% Rolling Left: Moderate Assistance - Patient 50-74% Supine to Sit: Maximal Assistance - Patient - Patient 25-49% Sit to Supine: Maximal Assistance - Patient 25-49% Transfers Sit to Stand: Maximal Assistance - Patient 25-49% Stand to Sit: Maximal Assistance - Patient 25-49%  Trunk/Postural Assessment  Cervical Assessment Cervical Assessment: Exceptions to [facility] Thoracic Assessment Thoracic Assessment: Exceptions to [facility] Lumbar Assessment Lumbar Assessment: Exceptions to [facility] Postural Control Postural Control: Deficits on evaluation Righting Reactions: delayed Protective Responses: delayed  Balance Balance Balance Assessed: Yes Static Sitting Balance Static Sitting - Balance Support: Feet supported Static Sitting - Level of Assistance: 3: Mod assist Dynamic Sitting  Balance Dynamic Sitting - Balance Support: Feet supported Dynamic Sitting - Level of Assistance: 3: Mod assist Sitting balance - Comments: pt needs BUE support for dynamic seated tasks Static Standing Balance Static Standing - Balance Support: During functional activity Static Standing - Level of Assistance: 2: Max assist Dynamic Standing Balance Dynamic Standing - Balance Support: During functional activity Dynamic Standing - Level of Assistance: 2: Max assist Dynamic Standing - Balance Activities: Lateral lean/weight shifting Extremity/Trunk Assessment RUE Assessment RUE Assessment: Exceptions to [facility] General Strength Comments: 4-/5 LUE Assessment LUE Assessment: Within Functional Limits  OT Treatment/Assessment:  Pt seen for OT discharge treatment session. Pt continues to progress daily since the end of last week on light ADL and transfer levels from medical hold to current status. See above for current levels. OT education on energy conservation, task simplification and breathing integration for BADL's and light UE HEP. Pt only able to tolerate brief intervals of simple UB self care and light UB strength training with low resistance. Pt will move to SNF tomorrow and all OT interventions completed at this time. Pt left with all safety needs, nurse call button and exit alarm engaged.   [name] 10/26/2022, 2:24 PM

## 2022-10-26 NOTE — Progress Notes (Signed)
Physical Therapy Discharge Summary  Patient Details  Name: Anna Weiss MRN: [id number] Date of Birth: 10/03/1925  Date of Discharge from PT service:October 26, 2022  Today's Date: 10/26/2022 PT Individual Time: 0933-1030 PT Individual Time Calculation (min): 57 min    Patient has met 0 of 10 long term goals due to decline in functional mobility due to change in medical status during admission.  Patient to discharge at a wheelchair level Max Assist.   Pt to discharge to SNF as family unavailable to provide appropriate level of care due to change in mobility following medical complications.   Reasons goals not met: Prior to change in medical status pt making progress and on track to meet long terms goals and largely required CGA/min A for bed mobility, transfers and gait >100 ft with RW. Pt with changes in medical status and underwent procedure. Pt with decline in functional mobility status following and largely requires mod A for bed mobility and max A for transfers. Pt is no longer ambulatory and to discharge at w/c level to SNF.   Recommendation:  Patient will benefit from ongoing skilled PT services in skilled nursing facility setting to continue to advance safe functional mobility, address ongoing impairments in strength, balance, coordination, activity tolerance, and minimize fall risk.  Equipment: TBD at next level of care (SNF)  Reasons for discharge: change in medical status, lack of progress toward goals, and discharge from hospital  Patient/family agrees with progress made and goals achieved: Yes  PT Discharge  Pain Interference Pain Effect on Sleep: 1. Rarely or not at all Pain Interference with Therapy Activities: 0. Does not apply - I have not received rehabilitationtherapy in the past 5 days Pain Interference with Day-to-Day Activities: 2. Occasionally Cognition Overall Cognitive Status: Within Functional Limits for tasks assessed Arousal/Alertness:  Awake/alert Orientation Level: Oriented to person;Oriented to situation;Oriented to place Memory: Impaired Awareness: Appears intact Problem Solving: Impaired Safety/Judgment: Appears intact Sensation Sensation Light Touch: Appears Intact Proprioception: Appears Intact Coordination Gross Motor Movements are Fluid and Coordinated: No Fine Motor Movements are Fluid and Coordinated: No Coordination and Movement Description: limited by pain and generalized weakness/deconditioning Finger Nose Finger Test: delyaed and intention tremor at end range bilaterally Heel Shin Test: WFL R LE, delayed and limited ROM L LE Motor  Motor Motor: Other (comment) (L hip fracture) Motor - Skilled Clinical Observations: generalized weakness and limited due to pain, decreased activity tolerance, balance/coordination deficits  Mobility Bed Mobility Bed Mobility: Rolling Right;Rolling Left Rolling Right: Moderate Assistance - Patient 50-74% Rolling Left: Moderate Assistance - Patient 50-74% Supine to Sit: Maximal Assistance - Patient - Patient 25-49% Sit to Supine: Maximal Assistance - Patient 25-49% Transfers Transfers: Sit to Stand;Stand to Sit;Stand Pivot Transfers Sit to Stand: Maximal Assistance - Patient 25-49% Stand to Sit: Maximal Assistance - Patient 25-49% Stand Pivot Transfers: Maximal Assistance - Patient 25 - 49% Stand Pivot Transfer Details: Tactile cues for sequencing;Tactile cues for placement;Visual cues/gestures for sequencing;Verbal cues for precautions/safety Transfer (Assistive device): None Locomotion  Gait Ambulation: No Gait Gait: No Stairs / Additional Locomotion Stairs: No Wheelchair Mobility Wheelchair Mobility: No  Trunk/Postural Assessment  Cervical Assessment Cervical Assessment: Exceptions to [facility] (forward head posture) Thoracic Assessment Thoracic Assessment: Exceptions to [facility] (kyphosis) Lumbar Assessment Lumbar Assessment: Exceptions to [facility] (posterior pelvic  tilt) Postural Control Postural Control: Deficits on evaluation Righting Reactions: delayed Protective Responses: delayed  Balance Balance Balance Assessed: Yes Static Sitting Balance Static Sitting - Balance Support: Feet supported Static Sitting - Level  of Assistance: 3: Mod assist Dynamic Sitting Balance Dynamic Sitting - Balance Support: Feet supported Dynamic Sitting - Level of Assistance: 3: Mod assist Static Standing Balance Static Standing - Balance Support: During functional activity Static Standing - Level of Assistance: 2: Max assist Dynamic Standing Balance Dynamic Standing - Balance Support: During functional activity Dynamic Standing - Level of Assistance: 2: Max assist Dynamic Standing - Balance Activities: Lateral lean/weight shifting Extremity Assessment  RLE Assessment RLE Assessment: Exceptions to [facility] General Strength Comments: grossly 4/5 LLE Assessment LLE Assessment: Exceptions to [facility] General Strength Comments: grossly 3/5 except hip flexion 2/5  Daily Treatment Session:   Pt received semi-reclined in bed, agreeable to PT. Pt declines pain and is incontinent of bowel on PT arrival. Pt requires mod A for rolling and is dependent for peri-care. Pt requires max A for supine to sit and static siting balance edge of bed. Pt performed stand pivot transfer with max A and PT providing bilateral knee block. PT assessed pain interference, sensation, coordination, and strength in preparation for discharge. Pt left seated in recliner at bedside with all needs in reach and chair alarm on.     [name] [name] PT, DPT  10/26/2022, 10:09 AM

## 2022-10-26 NOTE — Progress Notes (Signed)
PROGRESS NOTE   Subjective/Complaints: No problems overnight. Seems comfortable. No breathing issues  ROS: Limited due to cognitive/behavioral    Objective:   No results found. No results for input(s): "WBC", "HGB", "HCT", "PLT" in the last 72 hours.   Recent Labs    10/25/22 0309 10/26/22 0428  NA 138 139  K 3.4* 4.0  CL 91* 94*  CO2 39* 37*  GLUCOSE 112* 107*  BUN 16 20  CREATININE 0.59 0.61  CALCIUM 8.7* 8.6*    Intake/Output Summary (Last 24 hours) at 10/26/2022 0708 Last data filed at 10/26/2022 0444 Gross per 24 hour  Intake 200 ml  Output 900 ml  Net -700 ml     Pressure Injury 10/16/22 Ischial tuberosity Right Stage 2 -  Partial thickness loss of dermis presenting as a shallow open injury with a red, pink wound bed without slough. (Active)  10/16/22 0900  Location: Ischial tuberosity  Location Orientation: Right  Staging: Stage 2 -  Partial thickness loss of dermis presenting as a shallow open injury with a red, pink wound bed without slough.  Wound Description (Comments):   Present on Admission: No     Pressure Injury 10/16/22 Right Stage 2 -  Partial thickness loss of dermis presenting as a shallow open injury with a red, pink wound bed without slough. (Active)  10/16/22 0900  Location:   Location Orientation: Right  Staging: Stage 2 -  Partial thickness loss of dermis presenting as a shallow open injury with a red, pink wound bed without slough.  Wound Description (Comments):   Present on Admission: No    Physical Exam: Vital Signs Blood pressure (!) 146/68, pulse (!) 58, temperature 98.5 F (36.9 C), temperature source Oral, resp. rate 18, height _0  (1.702 m), weight 77 kg, SpO2 95 %. Constitutional: No distress . Vital signs reviewed. HEENT: NCAT, EOMI, oral membranes moist Neck: supple Cardiovascular: RRR without murmur. No JVD    Respiratory/Chest: CTA Bilaterally without wheezes or  rales. Normal effort    GI/Abdomen: BS +, non-tender, non-distended Ext: no clubbing, cyanosis Psych: pleasant and cooperative  Musculoskeletal:     Cervical back: Normal range of motion.     Comments: Nontender on palpation of bilateral hips, thighs on exam Skin: L hip less swelling; dressing C/D/I    General: Skin is warm.     Findings: Bruising (arms) present. No signs of ecchymosis or diffuse bruising.     Comments: Left hip incision CDI, dressing dry  Neurological: remains confused, nonverbal but follows some simple commands    Cranial Nerves: No cranial nerve deficit.     Comments: Motor: 4-5/5 Ue's.    Assessment/Plan: 1. Functional deficits which require 3+ hours per day of interdisciplinary therapy in a comprehensive inpatient rehab setting. Physiatrist is providing close team supervision and 24 hour management of active medical problems listed below. Physiatrist and rehab team continue to assess barriers to discharge/monitor patient progress toward functional and medical goals  Care Tool:  Bathing    Body parts bathed by patient: Face, Abdomen, Chest   Body parts bathed by helper: Right arm, Left arm, Front perineal area, Buttocks, Right upper leg, Left upper leg,  Right lower leg, Left lower leg     Bathing assist Assist Level: Maximal Assistance - Patient 24 - 49%     Upper Body Dressing/Undressing Upper body dressing   What is the patient wearing?: Hospital gown only    Upper body assist Assist Level: Maximal Assistance - Patient 25 - 49%    Lower Body Dressing/Undressing Lower body dressing      What is the patient wearing?: Hospital gown only     Lower body assist Assist for lower body dressing: Maximal Assistance - Patient 25 - 49%     Toileting Toileting    Toileting assist Assist for toileting: Dependent - Patient 0%     Transfers Chair/bed transfer  Transfers assist  Chair/bed transfer activity did not occur: Safety/medical concerns  (dizziness, pain, decreased balance)  Chair/bed transfer assist level: Minimal Assistance - Patient > 75%     Locomotion Ambulation   Ambulation assist   Ambulation activity did not occur: Safety/medical concerns (dizziness, pain, decreased balance)  Assist level: Minimal Assistance - Patient > 75% Assistive device: Walker-rolling Max distance: 38   Walk 10 feet activity   Assist  Walk 10 feet activity did not occur: Safety/medical concerns (dizziness, pain, decreased balance)  Assist level: Minimal Assistance - Patient > 75% Assistive device: Walker-rolling   Walk 50 feet activity   Assist Walk 50 feet with 2 turns activity did not occur: Safety/medical concerns         Walk 150 feet activity   Assist Walk 150 feet activity did not occur: Safety/medical concerns         Walk 10 feet on uneven surface  activity   Assist Walk 10 feet on uneven surfaces activity did not occur: Safety/medical concerns (dizziness, pain, decreased balance)         Wheelchair     Assist Is the patient using a wheelchair?: Yes Type of Wheelchair: Manual Wheelchair activity did not occur: Safety/medical concerns (dizziness, pain, decreased balance)         Wheelchair 50 feet with 2 turns activity    Assist    Wheelchair 50 feet with 2 turns activity did not occur: Safety/medical concerns (dizziness, pain, decreased balance)       Wheelchair 150 feet activity     Assist  Wheelchair 150 feet activity did not occur: Safety/medical concerns (dizziness, pain, decreased balance)       Blood pressure (!) 146/68, pulse (!) 58, temperature 98.5 F (36.9 C), temperature source Oral, resp. rate 18, height _0  (1.702 m), weight 77 kg, SpO2 95 %.  Medical Problem List and Plan: 1. Functional deficits secondary to left intertrochanteric femur fx after fall. Pt s/p IMN 09/25/22             -LLE WBAT             -patient may shower             -ELOS/Goals:  15-20 days  Continue CIR- PT and OT  Decrease to QD  -d/c to [facility] on Monday 12/4    2.  Hematuria: d/c Eliquis. Continue plavix 40m daily 3. Pain: continue SL morphine PRN for pain. tylenol and hydrocodone prn for pain             -robaxin prn for muscle spasms  11/8- pain limiting therapy- doesn't want opiates- mainly using tylenol-   11/9- d/w pt- she's willing to try tramadol 50 mg q6 hours prn- she's scared of Coding if takes Norco-  I explained that Tramadol is mild and shouldn't cause any cognitive issues.   11/10- pain much better- not resolved, but better with tramadol- con't tramadol and tylenol prn  11/14- pain still doing better with tramadol  11/17- is worse today since had surgery yesterday-doesn't want any other meds  11/18 reports pain controlled, continue tramadol PRN  - 11/25 Dced sedating medications, no current pain.   12/3 appears comfortable at present 4. Insomnia: continue morphine SL HS 5. Neuropsych/cognition: This patient is capable of making decisions on her own behalf. 6. Skin/Wound Care: dry dressings to left ear and hip  11/9- having some drainage- from L hip- is improved this Am- will monitor closely.  7. Fluids/Electrolytes/Nutrition: encourage appropriate PO             -pt's intake has inconsistent so far  - Improved 100-35% 11/25  12/2-3 intake variable, suboptimal  8. Nausea:              -might be positional or med related.              -also having associated gas             -pt told me she's been moving bowels, however I don't see a bm recorded             -will try sorbitol today, dulcolax suppository prn             -protonix 11/8- has ileus- per #15 below  11/9- still having nausea- will change diet to clear liquid per pt request- and monitor  11/23- back to regular diet- with 1500cc fluid restriction  11/24- placed IVFs x 2 4 hours and recheck in AM  11/25 -NA, BUN, Cr stable on 75 cc/hr IVF; Dced HCTZ as would complicate further  studies; recheck BMP in AM  12/2 Na+ 138--do not resume hctx, continue FR 9. Left lower lobe consolidation             -pneumonia ruled out. Likely atelectasis             -IS, OOB             -wean oxygen to off  11/7- will write order to wean O2.   11/27- O2 0.5-1L O2 due to pneumonia 10. Sinus bradycardia:             -on propranolol and verapamil at home             -continue to hold as HR in 50's  11/14- since still in 50s, will not restart  11. Essential HTN             -continue hctz  11/14- BP usually controlled but up today- will monitor for trend  11/15- somewhat elevated, but not sure if dropping- see #21           11/16- started hydralazine  11/17- just got hydralazine- is slightly better- might need it increased over weekend  -11/19 good control overall continue to monitor  11/20- somewhat variable- due to ABLA likely  11/22- doing better  11/25 - DC HCTZ d/t IVF, hyponatremia as above - unable to give PO antihypertensives QHS Vitals:   10/25/22 2134 10/26/22 0430  BP: (!) 126/50 (!) 146/68  Pulse:  (!) 58  Resp:  18  Temp:  98.5 F (36.9 C)  SpO2:  95%    12. Right middle lobe nodule 2.3 cm- should be considered primary lung CA until ruled out- also  has nodule as well 90m on R middle lung- could be mets per CT scan.              -f/u as outpt  11/7- d/w daughter- she said no f/u at this time- due to pt's age.   11/8- daughter doesn't want to let pt know about tumors.    13. Mild leukocytosis- 11.2   11/6- afebrile- other than difficulty swallowing- says it's dry?; denies UTI Sx's- if gets worse clinically, will recheck in AM- right now scheduled for Wed AM.   11/8- WBC down to 9.4-  14. Constipation  11/6- LBM on day of fall per pt and chart- will give Sorbitol after therapy and follow with SSE if needed.   11/7- had multiple BM's- didn't need SSE- per notes- feels cleaned out   11/19 LBM today, improved  12/2 bm this morning 15. ABLA  11/6- Down to 7.6-  will recheck Wednesday to make sure not dropping further- and transfuse if required.   11/8- Hb 7.3- on edge of needing transfusion.   11/9- Hb up to 8.0-   11/10- Hb 7.7- will con't to monitor closely.   11/15- Hb 8.4  11/20- Hb 6.4- will transfuse as below  11/25 - HgB stable ~9, however with ongoing gross hematuria; holding PM eliqius, continue plavix  16. Post op ileus resolved BM x 3 11/10, 1 BM 11/12- advance diet as tolerated   -Recheck level tomorrow  11/21- New n/V-- ordered another KUB to look for ileus and put back on clear liquid diet- also ordered IV compazine, PO/PR phenergan and d/c'd Zofran since can cause constipation  11/22- wants ot wait til tomorrow to go back to regular diet- but can be changed anytime pt wants to back to regular diet  11/27- was back to regular diet 17. Thrush?  11/9- will add Magic mouthwash- don't see plaques, but tongue dry and furrowed. Says things taste 'wierd".   11/10- said magic mouthwash helping Sx's.  18. Leukocytosis improved   11/15- WBC down to 7.8  11/16- will recheck today to make sure not elevated  11/17- WBC stable at 8k  Recheck tomorrow  11/20- WBC down to 6.8  11/23- CRP 7.6- and ESR 21- doing great  11/25 - inc 13 from 10; allow 24 hours on new abx regimen; recheck in AM. Ucx NGTD, likely etiologies PNA vs. Ongoing L hip infection - both should be covered on daptomycin and rocephin  11/27- WBC back down to 9k- - ID also helping today due to questionable worsening pneumonia on CXR    Latest Ref Rng & Units 10/20/2022    4:30 AM 10/19/2022    4:19 AM 10/18/2022    3:24 AM  CBC  WBC 4.0 - 10.5 K/uL 9.7  11.6  13.6   Hemoglobin 12.0 - 15.0 g/dL 9.5  9.4  9.4   Hematocrit 36.0 - 46.0 % 28.8  27.2  27.9   Platelets 150 - 400 K/uL 301  296  298     19. Hypokalemia and low Mg  11/10- repleted up to 3.5- will put on Kcl 10 mEq daily for now and recheck Monday- since K+ still  3.5- will also check Mg Monday as well   11/14= Mg  1.6 and K+ 3.1- repleted again  yesterday by pharmacy for [place] will recheck labs iN AM  11/15- K+ 4.2- will give IV Mg 2G for Mg 1.6- and recheck in AM  11/16- Mg 1.7- still low- will put  on PO Mg 400 mg BID  Recheck K+ and MG tomorrow  11/20- K+ 3.8 and Mg 1.8- will con't regimen  11/22- K+ 3.4- repleted again IV- repleted yesterday IV since was 3.0-   11/23- KCL- will replete since K+ 3.2 today- will give 40 mEq x2 and recheck in AM- will also change daily KCL to 20 mEq- Mg level 1.7  11/24- K+ 3.6  12/3 K+ 4.0===>continue supp   -renal signed off. Recommends f/u bmet in a week 20. Swallowing issues  11/14- appears to be thrush- will start Diflucan- due to renal issues, will need to be 50 mg daily- x 7 days doses-   11/15- reports things taste better and was able ot swallow some this AM of breakfast-con't diflucan  11/19 reports improved, continue to monitor  11/23- resolved- Diflucan finished  21. Dizziness/lightheadedness/presyncope  11/15- will have pt get orthostatics checked by nursing and therapy- BP on high side, so not sure if Sx's due to BP being high or dropping.   11/16- added hydralazine 25 mg TID- is somewhat better today  11/20- BP a little on low side- likely due to ABLA- will hold BP meds if need be.  22. Infection of L hip?  11/16- still having drainage from L hip- spoke to Ortho- they are taking her to OR today at 5pm for wash out-   11/17- Gram stains negative so far- waiting for Cx's- surgeon gave Ancef x2 IV and will monitor Cultures  11/19 wound culture growing staph,Continue vancomycin, surgery advising ID consult, called ID to discuss -consult placed  11/20- con't IV Vanc per ID- Cx's still pending final. 11/21- changed to Daptomycin, per ID- will double check labs today- CK is pending- didn't get done yesterday- will check labs today as well  11/22- CK looking good- 23- con't Daptomycin- stop date is 11/07/22 per ID  23. Urinary retention  11/16- is new- will  check U/A and Cx- and labs- CBC- with diff and CMP just to make sure no other issues  11/17- U/A (-) and doesn't have elevated WBC- mild left shift however- will start Flomax 0.4 mg q supper  11/8 continent PVRs a little improved today< 330m, continue to monitor, continue flomax, IC if > 350 ml  11/23- placed foley since having such high volumes and will leave in til early next week- will try voiding trial Monday/Tuesday- will also increase Flomax to 0.8 q supper  11/24- will treat possible UTI- and possible pneumonia- WBC up slightly to 10.6 with L shift  11/25: See above; ongoing hematuria w/o growth on Ucx; ? UTI  24. ABLA anemia-  11/20- down to 6.4- will transfuse 2 units since so low.    11/21- labs today- pending  11/22- Hb 9.7  11/25: Held QHS eliquis 2.5 for gross hematuria; cbc in AM  25. SOB/questionable pneumonia/bibasilar opacities  11/21- pt c/o SOB- O2 sats 98% on 2L- has been on O2 since surgery last week- will check CXR and gave IV Lasix 40 mg x1 - and let pt know we need her to void. If CXR Negative, will need CTA of chest to look for PE.   11/22- down to 0.5 L of O2- and satting well- actually CO2 was too high on 2L- so reduced due to CO2 retention- SOB improved- of note, no leukocytosis  11/23- off O2 completely- and satting well  11/24- more SOB- still has Bibasilar opacities- hadn't treated earlier because lack of elevated WBC and afebrile- will treat now- also  treat possible UTI per IM with Rocephin 1G q24 hours and wait for U Cx.   - 11/25: See above; if WBC continues uptrend in AM despite broad IV abx and IVF, will get Bcx 2 and re-engage IM, ID for infectious workup, hyponatremia, and hematuria  11/27- WBC down to 9.7, however CXR looks worse- on Rocephin- have asked ID to come take a look at pt- they will come by today.   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 Ceftriaxone completed for pneumonia treatment. Discussed this with daughter.  27.  Bradycardia: continue to monitor HR TID, resolved 28. Bilateral lower extremity edema: continue lasix IV as per nephro recommendations.   LOS: 28 days A FACE TO FACE EVALUATION WAS PERFORMED  Meredith Staggers 10/26/2022, 7:08 AM

## 2022-10-26 NOTE — Plan of Care (Signed)
  Problem: Consults Goal: RH GENERAL PATIENT EDUCATION Description: See Patient Education module for education specifics. Outcome: Progressing   Problem: RH BOWEL ELIMINATION Goal: RH STG MANAGE BOWEL WITH ASSISTANCE Description: STG Manage Bowel with mod i Assistance. Outcome: Progressing Goal: RH STG MANAGE BOWEL W/MEDICATION W/ASSISTANCE Description: STG Manage Bowel with Medication with mod I  Assistance. Outcome: Progressing   Problem: RH BLADDER ELIMINATION Goal: RH STG MANAGE BLADDER WITH ASSISTANCE Description: STG Manage Bladder With toileting Assistance Outcome: Progressing   Problem: RH SAFETY Goal: RH STG ADHERE TO SAFETY PRECAUTIONS W/ASSISTANCE/DEVICE Description: STG Adhere to Safety Precautions With Assistance/Device. Outcome: Progressing   Problem: RH PAIN MANAGEMENT Goal: RH STG PAIN MANAGED AT OR BELOW PT'S PAIN GOAL Description: < 4 with prns Outcome: Progressing   Problem: RH KNOWLEDGE DEFICIT GENERAL Goal: RH STG INCREASE KNOWLEDGE OF SELF CARE AFTER HOSPITALIZATION Description: Patient and family will be able to manage care at discharge using educational handouts independently Outcome: Progressing   Problem: Education: Goal: Knowledge of General Education information will improve Description: Including pain rating scale, medication(s)/side effects and non-pharmacologic comfort measures Outcome: Progressing   Problem: Health Behavior/Discharge Planning: Goal: Ability to manage health-related needs will improve Outcome: Progressing   Problem: Clinical Measurements: Goal: Ability to maintain clinical measurements within normal limits will improve Outcome: Progressing Goal: Will remain free from infection Outcome: Progressing Goal: Diagnostic test results will improve Outcome: Progressing Goal: Respiratory complications will improve Outcome: Progressing Goal: Cardiovascular complication will be avoided Outcome: Progressing   Problem:  Activity: Goal: Risk for activity intolerance will decrease Outcome: Progressing   Problem: Nutrition: Goal: Adequate nutrition will be maintained Outcome: Progressing   Problem: Coping: Goal: Level of anxiety will decrease Outcome: Progressing   Problem: Elimination: Goal: Will not experience complications related to bowel motility Outcome: Progressing Goal: Will not experience complications related to urinary retention Outcome: Progressing   Problem: Pain Managment: Goal: General experience of comfort will improve Outcome: Progressing   Problem: Safety: Goal: Ability to remain free from injury will improve Outcome: Progressing

## 2022-10-26 NOTE — Plan of Care (Signed)
  Problem: RH Simple Meal Prep Goal: LTG Patient will perform simple meal prep w/assist (OT) Description: LTG: Patient will perform simple meal prep with assistance, with/without cues (OT). Outcome: Not Applicable   Problem: RH Light Housekeeping Goal: LTG Patient will perform light housekeeping w/assist (OT) Description: LTG: Patient will perform light housekeeping with assistance, with/without cues (OT). Outcome: Not Applicable   Problem: RH Toilet Transfers Goal: LTG Patient will perform toilet transfers w/assist (OT) Description: LTG: Patient will perform toilet transfers with assist, with/without cues using equipment (OT) Outcome: Not Applicable   Problem: RH Balance Goal: LTG: Patient will maintain dynamic sitting balance (OT) Description: LTG:  Patient will maintain dynamic sitting balance with assistance during activities of daily living (OT) Flowsheets (Taken 10/26/2022 1412) LTG: Pt will maintain dynamic sitting balance during ADLs with: Contact Guard/Touching assist Note: Decline due to change in medical condition  Goal: LTG Patient will maintain dynamic standing with ADLs (OT) Description: LTG:  Patient will maintain dynamic standing balance with assist during activities of daily living (OT)  Flowsheets (Taken 10/26/2022 1412) LTG: Pt will maintain dynamic standing balance during ADLs with: Maximal Assistance - Patient 25 - 49%   Problem: Sit to Stand Goal: LTG:  Patient will perform sit to stand in prep for activites of daily living with assistance level (OT) Description: LTG:  Patient will perform sit to stand in prep for activites of daily living with assistance level (OT) Flowsheets (Taken 10/26/2022 1412) LTG: PT will perform sit to stand in prep for activites of daily living with assistance level: Maximal Assistance - Patient 25 - 49%   Problem: RH Grooming Goal: LTG Patient will perform grooming w/assist,cues/equip (OT) Description: LTG: Patient will perform grooming  with assist, with/without cues using equipment (OT) Flowsheets (Taken 10/26/2022 1412) LTG: Pt will perform grooming with assistance level of: Minimal Assistance - Patient > 75%   Problem: RH Bathing Goal: LTG Patient will bathe all body parts with assist levels (OT) Description: LTG: Patient will bathe all body parts with assist levels (OT) Flowsheets (Taken 10/26/2022 1412) LTG: Pt will perform bathing with assistance level/cueing: Maximal Assistance - Patient 25 - 49%   Problem: RH Dressing Goal: LTG Patient will perform upper body dressing (OT) Description: LTG Patient will perform upper body dressing with assist, with/without cues (OT). Flowsheets (Taken 10/26/2022 1412) LTG: Pt will perform upper body dressing with assistance level of: Minimal Assistance - Patient > 75% Goal: LTG Patient will perform lower body dressing w/assist (OT) Description: LTG: Patient will perform lower body dressing with assist, with/without cues in positioning using equipment (OT) Flowsheets (Taken 10/26/2022 1412) LTG: Pt will perform lower body dressing with assistance level of: Maximal Assistance - Patient 25 - 49%   Problem: RH Toileting Goal: LTG Patient will perform toileting task (3/3 steps) with assistance level (OT) Description: LTG: Patient will perform toileting task (3/3 steps) with assistance level (OT)  Flowsheets (Taken 10/26/2022 1412) LTG: Pt will perform toileting task (3/3 steps) with assistance level: Maximal Assistance - Patient 25 - 49%   Problem: RH Memory Goal: LTG Patient will demonstrate ability for day to day recall/carry over during activities of daily living with assistance level (OT) Description: LTG:  Patient will demonstrate ability for day to day recall/carry over during activities of daily living with assistance level (OT). Flowsheets (Taken 10/26/2022 1412) LTG:  Patient will demonstrate ability for day to day recall/carry over during activities of daily living with  assistance level (OT): Supervision

## 2022-10-27 ENCOUNTER — Other Ambulatory Visit (HOSPITAL_COMMUNITY): Payer: Self-pay

## 2022-10-27 LAB — CBC WITH DIFFERENTIAL/PLATELET
Abs Immature Granulocytes: 0.03 10*3/uL (ref 0.00–0.07)
Basophils Absolute: 0 10*3/uL (ref 0.0–0.1)
Basophils Relative: 1 %
Eosinophils Absolute: 0.3 10*3/uL (ref 0.0–0.5)
Eosinophils Relative: 5 %
HCT: 27.2 % — ABNORMAL LOW (ref 36.0–46.0)
Hemoglobin: 9 g/dL — ABNORMAL LOW (ref 12.0–15.0)
Immature Granulocytes: 1 %
Lymphocytes Relative: 25 %
Lymphs Abs: 1.4 10*3/uL (ref 0.7–4.0)
MCH: 35 pg — ABNORMAL HIGH (ref 26.0–34.0)
MCHC: 33.1 g/dL (ref 30.0–36.0)
MCV: 105.8 fL — ABNORMAL HIGH (ref 80.0–100.0)
Monocytes Absolute: 0.5 10*3/uL (ref 0.1–1.0)
Monocytes Relative: 10 %
Neutro Abs: 3.3 10*3/uL (ref 1.7–7.7)
Neutrophils Relative %: 58 %
Platelets: 215 10*3/uL (ref 150–400)
RBC: 2.57 MIL/uL — ABNORMAL LOW (ref 3.87–5.11)
RDW: 17.7 % — ABNORMAL HIGH (ref 11.5–15.5)
WBC: 5.6 10*3/uL (ref 4.0–10.5)
nRBC: 0 % (ref 0.0–0.2)

## 2022-10-27 LAB — BASIC METABOLIC PANEL
Anion gap: 7 (ref 5–15)
BUN: 20 mg/dL (ref 8–23)
CO2: 35 mmol/L — ABNORMAL HIGH (ref 22–32)
Calcium: 8.5 mg/dL — ABNORMAL LOW (ref 8.9–10.3)
Chloride: 96 mmol/L — ABNORMAL LOW (ref 98–111)
Creatinine, Ser: 0.53 mg/dL (ref 0.44–1.00)
GFR, Estimated: >60 mL/min (ref >60)
Glucose, Bld: 98 mg/dL (ref 70–99)
Potassium: 3.4 mmol/L — ABNORMAL LOW (ref 3.5–5.1)
Sodium: 138 mmol/L (ref 135–145)

## 2022-10-27 LAB — MAGNESIUM: Magnesium: 1.9 mg/dL (ref 1.7–2.4)

## 2022-10-27 MED ORDER — ENSURE ENLIVE PO LIQD: 237.0000 mL | Freq: Two times a day (BID) | ORAL | 12 refills | Status: DC

## 2022-10-27 MED ORDER — LINEZOLID 600 MG PO TABS
600.0000 mg | ORAL_TABLET | Freq: Two times a day (BID) | ORAL | 0 refills | Status: AC
  Filled 2022-10-27 (×2): qty 23, 12d supply, fill #0

## 2022-10-27 MED ORDER — ACETAMINOPHEN 325 MG PO TABS: 650.0000 mg | ORAL_TABLET | Freq: Four times a day (QID) | ORAL | Status: DC | PRN

## 2022-10-27 MED ORDER — LEVOTHYROXINE SODIUM 50 MCG PO TABS: 50.0000 ug | ORAL_TABLET | Freq: Every day | ORAL | Status: DC

## 2022-10-27 MED ORDER — POLYSACCHARIDE IRON COMPLEX 150 MG PO CAPS: 150.0000 mg | ORAL_CAPSULE | Freq: Every day | ORAL | Status: DC

## 2022-10-27 MED ORDER — TAMSULOSIN HCL 0.4 MG PO CAPS: 0.8000 mg | ORAL_CAPSULE | Freq: Every day | ORAL | Status: DC

## 2022-10-27 MED ORDER — LINEZOLID 600 MG PO TABS
600.0000 mg | ORAL_TABLET | Freq: Two times a day (BID) | ORAL | 0 refills | Status: DC
  Filled 2022-10-27: qty 22, 11d supply, fill #0

## 2022-10-27 MED ORDER — HYDRALAZINE HCL 25 MG PO TABS: 25.0000 mg | ORAL_TABLET | Freq: Three times a day (TID) | ORAL | Status: DC

## 2022-10-27 MED ORDER — POTASSIUM CHLORIDE 20 MEQ PO PACK: 40.0000 meq | PACK | Freq: Every day | ORAL | Status: DC

## 2022-10-27 MED ORDER — FAMOTIDINE 40 MG/5ML PO SUSR: 40.0000 mg | Freq: Two times a day (BID) | ORAL | 0 refills | Status: DC

## 2022-10-27 MED ORDER — MORPHINE SULFATE (CONCENTRATE) 10 MG/0.5ML PO SOLN: ORAL | 0 refills | Status: DC

## 2022-10-27 MED ORDER — CLOPIDOGREL BISULFATE 75 MG PO TABS: 75.0000 mg | ORAL_TABLET | Freq: Every day | ORAL | Status: DC

## 2022-10-27 MED ORDER — BISACODYL 10 MG RE SUPP: 10.0000 mg | Freq: Every day | RECTAL | 0 refills | Status: DC | PRN

## 2022-10-27 MED ORDER — BIOTENE DRY MOUTH MT LIQD: 15.0000 mL | OROMUCOSAL | Status: DC | PRN

## 2022-10-27 MED ORDER — POTASSIUM CHLORIDE 20 MEQ PO PACK
40.0000 meq | PACK | Freq: Every day | ORAL | Status: DC
  Administered 2022-10-27: 20 meq via ORAL
  Administered 2022-10-28: 40 meq via ORAL
  Filled 2022-10-27 (×2): qty 2

## 2022-10-27 NOTE — Progress Notes (Signed)
Orthopedic Surgery Post-operative Note   Assessment: Patient is a 88 y.o. female with left intertrochanteric femur fracture status post IMN (09/25/2022), developed persistent drainage and now s/p I&D (10/09/2022)   Plan: -Operative plans: complete -Can let soap and water run over the incisions now, do not submerge wound at any point -Sutures removed this morning, can leave incisions open to air -I encouraged her to continue to try to eat as much as she can to help with healing -Diet: regular -DVT ppx: per primary -Antibiotics: per ID -Weight bearing status: as tolerated -PT/OT evaluate and treat -Pain control -Follow up with me in office 2 weeks from discharge. Phone number included in discharge instructions to make that appt  ___________________________________________________________________________  Subjective: No acute events overnight. Not having any pain in her hip. Did not hurt this weekend either when she did some transfers to the chair. Feels her energy level has improved within the last couple of days. Denies paresthesias and numbness.   Physical Exam:  General: no acute distress, laying in bed Respiratory: unlabored breathing on supplemental O2, symmetric chest rise  Neurologic: alert, following commands, answering questions appropriately   MSK:   -Left lower extremity  No drainage on dressings, wounds appear well healed with sutures in place, no erythema, no active or expressible drainage  EHL/TA/GSC intact   Sensation intact to light touch in s/s/dp/sp/t nerve distributions Foot warm and well perfused   Patient name: Anna Weiss Patient MRN: [id number] Date: 10/27/22

## 2022-10-27 NOTE — Progress Notes (Signed)
I spoke with Dr. Linus Salmons about resuming the daptomycin as per Dr. Storm Frisk note on 11/30. He states we can continue the linezolid through 11/08/2022 with weekly CBC and CMP and close observation for side effects.

## 2022-10-27 NOTE — Progress Notes (Signed)
PICC removed per protocol. Upon removal pressure dressing with Vaseline guaze and cotton guaze was applied with direct pressure for 12mns. No bleeding noted upon removal. Pt instructed to remain in bed for 30 mins, Nurse noted, out of bed time 11:05am. Teach back to keep site clean and dry with dressing on for 24-48hrs. After removal of dressing use mild soap and water to clean no scrubbing. If site starts to bleed call the nurse if in the hospital, if at home apply direct pressure. If bleeding doesn't stop return to the emergency room.

## 2022-10-27 NOTE — Progress Notes (Signed)
Inpatient Rehabilitation Discharge Medication Review by a Pharmacist  A complete drug regimen review was completed for this patient to identify any potential clinically significant medication issues.  High Risk Drug Classes Is patient taking? Indication by Medication  Antipsychotic No   Anticoagulant No   Antibiotic Yes Linezolid - MRSA deep tissue infection   Opioid Yes Morphine - SOB/pain   Antiplatelet Yes Clopidogrel - stroke ppx  Hypoglycemics/insulin No   Vasoactive Medication Yes Tamsulosin - urinary hesitancy Hydralazine- HTN  Chemotherapy No   Other Yes Bisacodyl - PRN constipation Famotidine - GERD Iron Polysaccharides, KCl - supplement Simvastatin - HLD Levothyroxine - Hypothyroidism Ondansetron - PRN nausea/vomiting     Type of Medication Issue Identified Description of Issue Recommendation(s)  Drug Interaction(s) (clinically significant)     Duplicate Therapy     Allergy     No Medication Administration End Date     Incorrect Dose     Additional Drug Therapy Needed     Significant med changes from prior encounter (inform family/care partners about these prior to discharge). Propranolol and verapamil DCd due too bradycardia Hydrochlorothiazide Dcd for hyponatremia Communicate medication changes with patient/family at discharge  Other      Clinically significant medication issues were identified that warrant physician communication and completion of prescribed/recommended actions by midnight of the next day:  Yes discussed and clarified  Name of provider notified for urgent issues identified: PA Setzer   Provider Method of Notification: secure chat   Pharmacist comments:  Dr. Linus Salmons (ID) recommending to continue PO linezolid through 11/08/2022 with weekly CBC and CMP and close observation for side effects Unable to obtain linezolid at facility > prescription filled at [facility], patient will leave with medication in hand (full remaining duration  filled)  Time spent performing this drug regimen review (minutes): 20   Thank you for allowing pharmacy to be a part of this patient's care.  Ardyth Harps, PharmD Clinical Pharmacist

## 2022-10-27 NOTE — Progress Notes (Addendum)
Patient ID: SHEY YOTT, female   DOB: 08/17/1925, 88 y.o.   MRN: [id number]  MD feels pt is medically stable for transfer to [facility] today. Have contacted daughter-Jayne to let her know and make aware transport is set up for 3:00 pm today. Will get number for report for bedside RN  10:33 AM Was informed by Pam-PA pt will re-start IV antibiotics today until 12/16. Have reached out to [facility] to see if can provide this, await. [facility] responded and they can not take with diatomycin too high cost medication. Pam-PA to reach out to ID to see if can change medication. If not there is no bed offer  10:56 AM ID report no IV antibiotics but continue the zyvok orally. Have reached back out to [facility] to see if can accommodate this.  11:52 AM The oral antibiotic-Zyvok is also very expensive and [facility] can not take on this. Have asked Sandra-PA to call ID to see if any other can be used. Have updated pt and daughter who is in her room.   1;22 PM TOC  Pharmarcy to fill the antibiotic and pt can take with her to [place]. Spoke with [facility] who has given up the bed but will have one tomorrow. Will work on this and touch base in am. Pt aware and daughter is coming back so will let her know when she returns  1:59 PM Met with pt and daughter to update daughter on plan for tomorrow [facility] pharmacy has already delivered the antibiotic to her and she will take to [facility] tomorrow. [facility] is set up for 4:30 pm tomorrow to transport. See in am

## 2022-10-27 NOTE — Progress Notes (Signed)
PROGRESS NOTE   Subjective/Complaints: No complaints this morning PICC d/c order placed Labs reviewed and are stable Discussed stable to d/c to SNF today.   ROS: Limited due to cognitive/behavioral    Objective:   No results found. Recent Labs    10/27/22 0357  WBC 5.6  HGB 9.0*  HCT 27.2*  PLT 215     Recent Labs    10/26/22 0428 10/27/22 0357  NA 139 138  K 4.0 3.4*  CL 94* 96*  CO2 37* 35*  GLUCOSE 107* 98  BUN 20 20  CREATININE 0.61 0.53  CALCIUM 8.6* 8.5*    Intake/Output Summary (Last 24 hours) at 10/27/2022 0959 Last data filed at 10/27/2022 0815 Gross per 24 hour  Intake 480 ml  Output 700 ml  Net -220 ml     Pressure Injury 10/16/22 Ischial tuberosity Right Stage 2 -  Partial thickness loss of dermis presenting as a shallow open injury with a red, pink wound bed without slough. (Active)  10/16/22 0900  Location: Ischial tuberosity  Location Orientation: Right  Staging: Stage 2 -  Partial thickness loss of dermis presenting as a shallow open injury with a red, pink wound bed without slough.  Wound Description (Comments):   Present on Admission: No     Pressure Injury 10/16/22 Right Stage 2 -  Partial thickness loss of dermis presenting as a shallow open injury with a red, pink wound bed without slough. (Active)  10/16/22 0900  Location:   Location Orientation: Right  Staging: Stage 2 -  Partial thickness loss of dermis presenting as a shallow open injury with a red, pink wound bed without slough.  Wound Description (Comments):   Present on Admission: No    Physical Exam: Vital Signs Blood pressure (!) 135/45, pulse (!) 55, temperature 97.7 F (36.5 C), resp. rate 15, height _0  (1.702 m), weight 65 kg, SpO2 98 %. Gen: no distress, normal appearing HEENT: oral mucosa pink and moist, NCAT Cardio: Reg rate Chest: normal effort, normal rate of breathing Abd: soft, non-distended Ext:  no edema Psych: pleasant, normal affect Skin: intact Musculoskeletal:     Cervical back: Normal range of motion.     Comments: Nontender on palpation of bilateral hips, thighs on exam Skin: L hip less swelling; dressing C/D/I    General: Skin is warm.     Findings: Bruising (arms) present. No signs of ecchymosis or diffuse bruising.     Comments: Left hip incision CDI, dressing dry  Neurological: remains confused, nonverbal but follows some simple commands    Cranial Nerves: No cranial nerve deficit.     Comments: Motor: 4-5/5 Ue's.    Assessment/Plan: 1. Functional deficits which require 3+ hours per day of interdisciplinary therapy in a comprehensive inpatient rehab setting. Physiatrist is providing close team supervision and 24 hour management of active medical problems listed below. Physiatrist and rehab team continue to assess barriers to discharge/monitor patient progress toward functional and medical goals  Care Tool:  Bathing    Body parts bathed by patient: Face, Abdomen, Chest, Front perineal area, Left arm, Right arm, Left upper leg   Body parts bathed by helper: Right lower  leg, Left lower leg, Buttocks     Bathing assist Assist Level: Moderate Assistance - Patient 50 - 74%     Upper Body Dressing/Undressing Upper body dressing   What is the patient wearing?: Hospital gown only    Upper body assist Assist Level: Minimal Assistance - Patient > 75%    Lower Body Dressing/Undressing Lower body dressing      What is the patient wearing?: Hospital gown only, Incontinence brief     Lower body assist Assist for lower body dressing: Moderate Assistance - Patient 50 - 74%     Toileting Toileting    Toileting assist Assist for toileting: Maximal Assistance - Patient 25 - 49%     Transfers Chair/bed transfer  Transfers assist  Chair/bed transfer activity did not occur: Safety/medical concerns (dizziness, pain, decreased balance)  Chair/bed transfer assist  level: Maximal Assistance - Patient 25 - 49%     Locomotion Ambulation   Ambulation assist   Ambulation activity did not occur: Safety/medical concerns (unable to perform due to decline in mobility status following change in medical status)  Assist level: Minimal Assistance - Patient > 75% Assistive device: Walker-rolling Max distance: 38   Walk 10 feet activity   Assist  Walk 10 feet activity did not occur: Safety/medical concerns (dizziness, pain, decreased balance)  Assist level: Minimal Assistance - Patient > 75% Assistive device: Walker-rolling   Walk 50 feet activity   Assist Walk 50 feet with 2 turns activity did not occur: Safety/medical concerns         Walk 150 feet activity   Assist Walk 150 feet activity did not occur: Safety/medical concerns         Walk 10 feet on uneven surface  activity   Assist Walk 10 feet on uneven surfaces activity did not occur: Safety/medical concerns (unable to perform due to decline in mobility status following change in medical status)         Wheelchair     Assist Is the patient using a wheelchair?: Yes Type of Wheelchair: Manual Wheelchair activity did not occur: Safety/medical concerns (dizziness, pain, decreased balance)  Wheelchair assist level: Maximal Assistance - Patient 25 - 49% Max wheelchair distance: 10 ft    Wheelchair 50 feet with 2 turns activity    Assist    Wheelchair 50 feet with 2 turns activity did not occur: Safety/medical concerns (unable to perform due to decline in mobility status following change in medical status)       Wheelchair 150 feet activity     Assist  Wheelchair 150 feet activity did not occur: Safety/medical concerns (unable to perform due to decline in mobility status following change in medical status)       Blood pressure (!) 135/45, pulse (!) 55, temperature 97.7 F (36.5 C), resp. rate 15, height _0  (1.702 m), weight 65 kg, SpO2 98 %.  Medical  Problem List and Plan: 1. Functional deficits secondary to left intertrochanteric femur fx after fall. Pt s/p IMN 09/25/22             -LLE WBAT             -patient may shower             -ELOS/Goals: 15-20 days  Continue CIR- PT and OT  Labs reviewed and stable  -d/c to [facility] today    2.  Hematuria: d/c Eliquis. Continue plavix 73m daily 3. Pain: continue SL morphine PRN for pain. tylenol and hydrocodone prn for  pain             -robaxin prn for muscle spasms  11/8- pain limiting therapy- doesn't want opiates- mainly using tylenol-   11/9- d/w pt- she's willing to try tramadol 50 mg q6 hours prn- she's scared of Coding if takes Norco- I explained that Tramadol is mild and shouldn't cause any cognitive issues.   11/10- pain much better- not resolved, but better with tramadol- con't tramadol and tylenol prn  11/14- pain still doing better with tramadol  11/17- is worse today since had surgery yesterday-doesn't want any other meds  11/18 reports pain controlled, continue tramadol PRN  - 11/25 Dced sedating medications, no current pain.   12/3 appears comfortable at present 4. Insomnia: continue morphine SL HS 5. Neuropsych/cognition: This patient is capable of making decisions on her own behalf. 6. Skin/Wound Care: dry dressings to left ear and hip  11/9- having some drainage- from L hip- is improved this Am- will monitor closely.  7. Fluids/Electrolytes/Nutrition: encourage appropriate PO             -pt's intake has inconsistent so far  - Improved 100-35% 11/25  12/2-3 intake variable, suboptimal  8. Nausea:              -might be positional or med related.              -also having associated gas             -pt told me she's been moving bowels, however I don't see a bm recorded             -will try sorbitol today, dulcolax suppository prn             -protonix 11/8- has ileus- per #15 below  11/9- still having nausea- will change diet to clear liquid per pt request- and  monitor  11/23- back to regular diet- with 1500cc fluid restriction  11/24- placed IVFs x 2 4 hours and recheck in AM  11/25 -NA, BUN, Cr stable on 75 cc/hr IVF; Dced HCTZ as would complicate further studies; recheck BMP in AM  12/2 Na+ 138--do not resume hctx, continue FR 9. Left lower lobe consolidation             -pneumonia ruled out. Likely atelectasis             -IS, OOB             -wean oxygen to off  11/7- will write order to wean O2.   11/27- O2 0.5-1L O2 due to pneumonia 10. Sinus bradycardia:             -on propranolol and verapamil at home             -continue to hold as HR in 50's  11/14- since still in 50s, will not restart  11. Essential HTN             -continue hctz  11/14- BP usually controlled but up today- will monitor for trend  11/15- somewhat elevated, but not sure if dropping- see #21           11/16- started hydralazine  11/17- just got hydralazine- is slightly better- might need it increased over weekend  -11/19 good control overall continue to monitor  11/20- somewhat variable- due to ABLA likely  11/22- doing better  11/25 - DC HCTZ d/t IVF, hyponatremia as above - unable to give PO antihypertensives QHS Vitals:  10/27/22 0424 10/27/22 0821  BP: 135/66 (!) 135/45  Pulse: (!) 55 (!) 55  Resp: 18 15  Temp: 97.6 F (36.4 C) 97.7 F (36.5 C)  SpO2: 96% 98%    12. Right middle lobe nodule 2.3 cm- should be considered primary lung CA until ruled out- also has nodule as well 66m on R middle lung- could be mets per CT scan.              -f/u as outpt  11/7- d/w daughter- she said no f/u at this time- due to pt's age.   11/8- daughter doesn't want to let pt know about tumors.    13. Mild leukocytosis- 11.2   11/6- afebrile- other than difficulty swallowing- says it's dry?; denies UTI Sx's- if gets worse clinically, will recheck in AM- right now scheduled for Wed AM.   11/8- WBC down to 9.4-  14. Constipation  11/6- LBM on day of fall per pt and  chart- will give Sorbitol after therapy and follow with SSE if needed.   11/7- had multiple BM's- didn't need SSE- per notes- feels cleaned out   11/19 LBM today, improved  12/2 bm this morning 15. ABLA  11/6- Down to 7.6- will recheck Wednesday to make sure not dropping further- and transfuse if required.   11/8- Hb 7.3- on edge of needing transfusion.   11/9- Hb up to 8.0-   11/10- Hb 7.7- will con't to monitor closely.   11/15- Hb 8.4  11/20- Hb 6.4- will transfuse as below  11/25 - HgB stable ~9, however with ongoing gross hematuria; holding PM eliqius, continue plavix  16. Post op ileus resolved BM x 3 11/10, 1 BM 11/12- advance diet as tolerated   -Recheck level tomorrow  11/21- New n/V-- ordered another KUB to look for ileus and put back on clear liquid diet- also ordered IV compazine, PO/PR phenergan and d/c'd Zofran since can cause constipation  11/22- wants ot wait til tomorrow to go back to regular diet- but can be changed anytime pt wants to back to regular diet  11/27- was back to regular diet 17. Thrush?  11/9- will add Magic mouthwash- don't see plaques, but tongue dry and furrowed. Says things taste 'wierd".   11/10- said magic mouthwash helping Sx's.  18. Leukocytosis improved   11/15- WBC down to 7.8  11/16- will recheck today to make sure not elevated  11/17- WBC stable at 8k  Recheck tomorrow  11/20- WBC down to 6.8  11/23- CRP 7.6- and ESR 21- doing great  11/25 - inc 13 from 10; allow 24 hours on new abx regimen; recheck in AM. Ucx NGTD, likely etiologies PNA vs. Ongoing L hip infection - both should be covered on daptomycin and rocephin  11/27- WBC back down to 9k- - ID also helping today due to questionable worsening pneumonia on CXR    Latest Ref Rng & Units 10/27/2022    3:57 AM 10/20/2022    4:30 AM 10/19/2022    4:19 AM  CBC  WBC 4.0 - 10.5 K/uL 5.6  9.7  11.6   Hemoglobin 12.0 - 15.0 g/dL 9.0  9.5  9.4   Hematocrit 36.0 - 46.0 % 27.2  28.8  27.2    Platelets 150 - 400 K/uL 215  301  296     19. Hypokalemia and low Mg  11/10- repleted up to 3.5- will put on Kcl 10 mEq daily for now and recheck Monday- since K+ still  3.5- will also check Mg Monday as well   11/14= Mg 1.6 and K+ 3.1- repleted again  yesterday by pharmacy for [place]- will recheck labs iN AM  11/15- K+ 4.2- will give IV Mg 2G for Mg 1.6- and recheck in AM  11/16- Mg 1.7- still low- will put on PO Mg 400 mg BID  Recheck K+ and MG tomorrow  11/20- K+ 3.8 and Mg 1.8- will con't regimen  11/22- K+ 3.4- repleted again IV- repleted yesterday IV since was 3.0-   11/23- KCL- will replete since K+ 3.2 today- will give 40 mEq x2 and recheck in AM- will also change daily KCL to 20 mEq- Mg level 1.7  11/24- K+ 3.6  12/3 K+ 4.0===>continue supp   -renal signed off. Recommends f/u bmet in a week 20. Swallowing issues  11/14- appears to be thrush- will start Diflucan- due to renal issues, will need to be 50 mg daily- x 7 days doses-   11/15- reports things taste better and was able ot swallow some this AM of breakfast-con't diflucan  11/19 reports improved, continue to monitor  11/23- resolved- Diflucan finished  21. Dizziness/lightheadedness/presyncope  11/15- will have pt get orthostatics checked by nursing and therapy- BP on high side, so not sure if Sx's due to BP being high or dropping.   11/16- added hydralazine 25 mg TID- is somewhat better today  11/20- BP a little on low side- likely due to ABLA- will hold BP meds if need be.  22. Infection of L hip?  11/16- still having drainage from L hip- spoke to Ortho- they are taking her to OR today at 5pm for wash out-   11/17- Gram stains negative so far- waiting for Cx's- surgeon gave Ancef x2 IV and will monitor Cultures  11/19 wound culture growing staph,Continue vancomycin, surgery advising ID consult, called ID to discuss -consult placed  11/20- con't IV Vanc per ID- Cx's still pending final. 11/21- changed to Daptomycin, per  ID- will double check labs today- CK is pending- didn't get done yesterday- will check labs today as well  11/22- CK looking good- 23- con't Daptomycin- stop date is 11/07/22 per ID  23. Urinary retention  11/16- is new- will check U/A and Cx- and labs- CBC- with diff and CMP just to make sure no other issues  11/17- U/A (-) and doesn't have elevated WBC- mild left shift however- will start Flomax 0.4 mg q supper  11/8 continent PVRs a little improved today< 322m, continue to monitor, continue flomax, IC if > 350 ml  11/23- placed foley since having such high volumes and will leave in til early next week- will try voiding trial Monday/Tuesday- will also increase Flomax to 0.8 q supper  11/24- will treat possible UTI- and possible pneumonia- WBC up slightly to 10.6 with L shift  11/25: See above; ongoing hematuria w/o growth on Ucx; ? UTI  24. ABLA anemia-  11/20- down to 6.4- will transfuse 2 units since so low.    11/21- labs today- pending  11/22- Hb 9.7  11/25: Held QHS eliquis 2.5 for gross hematuria; cbc in AM  25. SOB/questionable pneumonia/bibasilar opacities  11/21- pt c/o SOB- O2 sats 98% on 2L- has been on O2 since surgery last week- will check CXR and gave IV Lasix 40 mg x1 - and let pt know we need her to void. If CXR Negative, will need CTA of chest to look for PE.   11/22- down to 0.5 L of  O2- and satting well- actually CO2 was too high on 2L- so reduced due to CO2 retention- SOB improved- of note, no leukocytosis  11/23- off O2 completely- and satting well  11/24- more SOB- still has Bibasilar opacities- hadn't treated earlier because lack of elevated WBC and afebrile- will treat now- also treat possible UTI per IM with Rocephin 1G q24 hours and wait for U Cx.   - 11/25: See above; if WBC continues uptrend in AM despite broad IV abx and IVF, will get Bcx 2 and re-engage IM, ID for infectious workup, hyponatremia, and hematuria  11/27- WBC down to 9.7, however CXR looks worse-  on Rocephin- have asked ID to come take a look at pt- they will come by today.  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 Ceftriaxone completed for pneumonia treatment. Discussed this with daughter. Continue Linezolid 27. Bradycardia: continue to monitor HR TID, improved 28. Bilateral lower extremity edema: continue lasix IV prn as needed at SNF.    >30 minutes spent in discharge of patient including review of medications and follow-up appointments, physical examination, and in answering all patient's questions   LOS: 29 days A FACE TO [place] 10/27/2022, 9:59 AM

## 2022-10-27 NOTE — Progress Notes (Signed)
Inpatient Rehabilitation Care Coordinator Discharge Note   Patient Details  Name: VARSHINI ARRANTS MRN: [id number] Date of Birth: 1925/05/29   Discharge location: GOING TO [facility] FOR MORE REHAB  Length of Stay: 30 days  Discharge activity level: MOD/MAX LEVEL  Home/community participation: ACTIVE  Patient response [id number] Literacy - How often do you need to have someone help you when you read instructions, pamphlets, or other written material from your doctor or pharmacy?: Never  Patient response [id number] Isolation - How often do you feel lonely or isolated from those around you?: Rarely  Services provided included: MD, RD, PT, OT, SLP, RN, CM, TR, Pharmacy, Neuropsych, SW  Financial Services:  Financial Services Utilized: Medicare    Choices offered to/list presented to: [facility]  Follow-up services arranged:  Other (Comment) (SNF) Home Health Agency: [facility]         Patient response to transportation need: Is the patient able to respond to transportation needs?: Yes In the past 12 months, has lack of transportation kept you from medical appointments or from getting medications?: No In the past 12 months, has lack of transportation kept you from meetings, work, or from getting things needed for daily living?: No    Comments (or additional information):PT VERY DECONDITIONED AND NEEDING MORE REHAB BEFORE CAN GO HOME WITH DAUGHTER'S ASSIST. PACKET LEFT ON DESK FOR SNF BEDSIDE RN-BECKY GIVEN NUMBER FOR REPORT. DAUGHTER COMPLETING PAPERWORK AT FACILITY AT 1:30. [facility] COMING AT 4:30 TO TRANSPORT TO FACILITY  Patient/Family verbalized understanding of follow-up arrangements:  Yes  Individual responsible for coordination of the follow-up plan: JANYE-DAUGHTER [phone]  Confirmed correct DME delivered: Elease Hashimoto 10/27/2022    Elease Hashimoto

## 2022-10-28 ENCOUNTER — Other Ambulatory Visit (HOSPITAL_COMMUNITY): Payer: Self-pay

## 2022-10-28 DIAGNOSIS — Z4789 Encounter for other orthopedic aftercare: Secondary | ICD-10-CM | POA: Diagnosis not present

## 2022-10-28 DIAGNOSIS — F0390 Unspecified dementia without behavioral disturbance: Secondary | ICD-10-CM | POA: Diagnosis not present

## 2022-10-28 DIAGNOSIS — N139 Obstructive and reflux uropathy, unspecified: Secondary | ICD-10-CM | POA: Diagnosis not present

## 2022-10-28 DIAGNOSIS — L89150 Pressure ulcer of sacral region, unstageable: Secondary | ICD-10-CM | POA: Diagnosis not present

## 2022-10-28 DIAGNOSIS — R404 Transient alteration of awareness: Secondary | ICD-10-CM | POA: Diagnosis not present

## 2022-10-28 DIAGNOSIS — R41841 Cognitive communication deficit: Secondary | ICD-10-CM | POA: Diagnosis not present

## 2022-10-28 DIAGNOSIS — R1310 Dysphagia, unspecified: Secondary | ICD-10-CM | POA: Diagnosis not present

## 2022-10-28 DIAGNOSIS — S72142S Displaced intertrochanteric fracture of left femur, sequela: Secondary | ICD-10-CM | POA: Diagnosis not present

## 2022-10-28 DIAGNOSIS — Z741 Need for assistance with personal care: Secondary | ICD-10-CM | POA: Diagnosis not present

## 2022-10-28 DIAGNOSIS — M6259 Muscle wasting and atrophy, not elsewhere classified, multiple sites: Secondary | ICD-10-CM | POA: Diagnosis not present

## 2022-10-28 DIAGNOSIS — R4182 Altered mental status, unspecified: Secondary | ICD-10-CM | POA: Diagnosis not present

## 2022-10-28 DIAGNOSIS — I639 Cerebral infarction, unspecified: Secondary | ICD-10-CM | POA: Diagnosis not present

## 2022-10-28 DIAGNOSIS — R1311 Dysphagia, oral phase: Secondary | ICD-10-CM | POA: Diagnosis not present

## 2022-10-28 DIAGNOSIS — Z7401 Bed confinement status: Secondary | ICD-10-CM | POA: Diagnosis not present

## 2022-10-28 DIAGNOSIS — J189 Pneumonia, unspecified organism: Secondary | ICD-10-CM | POA: Diagnosis not present

## 2022-10-28 DIAGNOSIS — T8149XS Infection following a procedure, other surgical site, sequela: Secondary | ICD-10-CM | POA: Diagnosis not present

## 2022-10-28 DIAGNOSIS — M25552 Pain in left hip: Secondary | ICD-10-CM | POA: Diagnosis not present

## 2022-10-28 DIAGNOSIS — Z9181 History of falling: Secondary | ICD-10-CM | POA: Diagnosis not present

## 2022-10-28 DIAGNOSIS — S72142D Displaced intertrochanteric fracture of left femur, subsequent encounter for closed fracture with routine healing: Secondary | ICD-10-CM | POA: Diagnosis not present

## 2022-10-28 DIAGNOSIS — Z7902 Long term (current) use of antithrombotics/antiplatelets: Secondary | ICD-10-CM | POA: Diagnosis not present

## 2022-10-28 DIAGNOSIS — I959 Hypotension, unspecified: Secondary | ICD-10-CM | POA: Diagnosis not present

## 2022-10-28 DIAGNOSIS — Z515 Encounter for palliative care: Secondary | ICD-10-CM | POA: Diagnosis not present

## 2022-10-28 DIAGNOSIS — R2681 Unsteadiness on feet: Secondary | ICD-10-CM | POA: Diagnosis not present

## 2022-10-28 DIAGNOSIS — E785 Hyperlipidemia, unspecified: Secondary | ICD-10-CM | POA: Diagnosis not present

## 2022-10-28 DIAGNOSIS — Z7189 Other specified counseling: Secondary | ICD-10-CM | POA: Diagnosis not present

## 2022-10-28 DIAGNOSIS — T8149XA Infection following a procedure, other surgical site, initial encounter: Secondary | ICD-10-CM | POA: Diagnosis not present

## 2022-10-28 DIAGNOSIS — R410 Disorientation, unspecified: Secondary | ICD-10-CM | POA: Diagnosis not present

## 2022-10-28 DIAGNOSIS — R531 Weakness: Secondary | ICD-10-CM | POA: Diagnosis not present

## 2022-10-28 DIAGNOSIS — J90 Pleural effusion, not elsewhere classified: Secondary | ICD-10-CM | POA: Diagnosis not present

## 2022-10-28 DIAGNOSIS — R0600 Dyspnea, unspecified: Secondary | ICD-10-CM | POA: Diagnosis not present

## 2022-10-28 DIAGNOSIS — S0003XS Contusion of scalp, sequela: Secondary | ICD-10-CM | POA: Diagnosis not present

## 2022-10-28 DIAGNOSIS — R000 Tachycardia, unspecified: Secondary | ICD-10-CM | POA: Diagnosis not present

## 2022-10-28 DIAGNOSIS — I10 Essential (primary) hypertension: Secondary | ICD-10-CM | POA: Diagnosis not present

## 2022-10-28 DIAGNOSIS — M6281 Muscle weakness (generalized): Secondary | ICD-10-CM | POA: Diagnosis not present

## 2022-10-28 NOTE — Progress Notes (Signed)
Patient ID: Anna Weiss, female   DOB: 1925/11/18, 88 y.o.   MRN: [id number]  Daughter expressed concerns about pt going today due to different and seems like picking skin and somewhat confused. MD and PA made aware and are talking with daughter. Also concerned about no bed rails in NH. MD and PA feel medically ready to transfer today to [facility].

## 2022-10-28 NOTE — Progress Notes (Signed)
Asked to evaluate patient due to "picking" at the air this afternoon. RN reports patient had feet on bedside table and re-positioned her several times today. On arrival to room, the patient is awake and alert. She can tell me her name, the year, the month and the holiday in the month. She denies pain and states she has not had difficulty with her breathing. She tells me she ate lunch but then describes what she had for breakfast. Daughter has just arrived from [facility] and is concerned that there are no bed rails at the facility. It was explained that there are no restraints of any kind at SNF. Her VVS and she is afebrile. I discussed recent lab work and medications as well as follow-up with ID and other providers as in the AVS and discharge summary. Discussed with Dr. Joesph Fillers who also examined the patient.  I emphasized to her daughter that optimal safety would be for someone to be with her mom at all times. We also discussed that the transition to a new facility would likely be stressful for her mom and she may develop some worsening of confusion/sundowning/delirium.

## 2022-10-28 NOTE — Progress Notes (Signed)
Orthopedic Surgery Post-operative Note   Assessment: Patient is a 88 y.o. female with left intertrochanteric femur fracture status post IMN (09/25/2022), developed persistent drainage and now s/p I&D (10/09/2022)   Plan: -Operative plans: complete -Can let soap and water run over the incisions now, do not submerge wound at any point -Sutures removed this morning, can leave incisions open to air -Diet: regular -DVT ppx: per primary -Antibiotics: per ID -Weight bearing status: as tolerated -PT/OT evaluate and treat -Pain control -Follow up with me in office 2 weeks from discharge. Phone number included in discharge instructions to make that appt  ___________________________________________________________________________  Subjective: No acute events overnight. Resting this afternoon. Not having any pain in her hip. No drainage or redness seen. Denies paresthesias and numbness.   Physical Exam:  General: no acute distress, laying in bed Respiratory: unlabored breathing on supplemental O2, symmetric chest rise  Neurologic: alert, following commands, answering questions appropriately   MSK:   -Left lower extremity  Wounds appear well healed, no erythema, no active or expressible drainage  EHL/TA/GSC intact   Sensation intact to light touch in s/s/dp/sp/t nerve distributions Foot warm and well perfused   Patient name: Anna Weiss Patient MRN: [id number] Date: 10/28/22

## 2022-10-28 NOTE — Progress Notes (Addendum)
PROGRESS NOTE   Subjective/Complaints: No concerns or complaints this AM. Going to [place] today.   ROS: Limited due to cognitive/behavioral    Objective:   No results found. Recent Labs    10/27/22 0357  WBC 5.6  HGB 9.0*  HCT 27.2*  PLT 215      Recent Labs    10/26/22 0428 10/27/22 0357  NA 139 138  K 4.0 3.4*  CL 94* 96*  CO2 37* 35*  GLUCOSE 107* 98  BUN 20 20  CREATININE 0.61 0.53  CALCIUM 8.6* 8.5*     Intake/Output Summary (Last 24 hours) at 10/28/2022 0824 Last data filed at 10/28/2022 0732 Gross per 24 hour  Intake 354 ml  Output 676 ml  Net -322 ml      Pressure Injury 10/16/22 Ischial tuberosity Right Stage 2 -  Partial thickness loss of dermis presenting as a shallow open injury with a red, pink wound bed without slough. (Active)  10/16/22 0900  Location: Ischial tuberosity  Location Orientation: Right  Staging: Stage 2 -  Partial thickness loss of dermis presenting as a shallow open injury with a red, pink wound bed without slough.  Wound Description (Comments):   Present on Admission: No     Pressure Injury 10/16/22 Right Stage 2 -  Partial thickness loss of dermis presenting as a shallow open injury with a red, pink wound bed without slough. (Active)  10/16/22 0900  Location:   Location Orientation: Right  Staging: Stage 2 -  Partial thickness loss of dermis presenting as a shallow open injury with a red, pink wound bed without slough.  Wound Description (Comments):   Present on Admission: No    Physical Exam: Vital Signs Blood pressure (!) 163/53, pulse (!) 51, temperature 97.8 F (36.6 C), resp. rate 17, height _0  (1.702 m), weight 68 kg, SpO2 90 %. Gen: no distress, normal appearing HEENT: oral mucosa pink and moist, NCAT Cardio: Reg rate Chest: normal effort, normal rate of breathing Abd: soft, non-distended Ext: no edema Psych: pleasant, normal affect Skin:  intact Musculoskeletal:     Cervical back: Normal range of motion.     Comments: Nontender on palpation of bilateral hips, thighs on exam Skin: L hip less swelling; dressing C/D/I    General: Skin is warm.     Findings: Bruising (arms) present. No signs of ecchymosis or diffuse bruising.      Comments: Left hip incision CDI, dressing dry GU foley in place draining yellow urine Neurological: awake and alert, follows simple command, answering questions appropriately,  gives her full name, says he is in rehab. asked why she shouldn't get up herself , she answered "so I dont fall"    Cranial Nerves: No cranial nerve deficit.     Comments: Motor: 4-5/5 Ue's.    Assessment/Plan: 1. Functional deficits which require 3+ hours per day of interdisciplinary therapy in a comprehensive inpatient rehab setting. Physiatrist is providing close team supervision and 24 hour management of active medical problems listed below. Physiatrist and rehab team continue to assess barriers to discharge/monitor patient progress toward functional and medical goals  Care Tool:  Bathing    Body parts  bathed by patient: Face, Abdomen, Chest, Front perineal area, Left arm, Right arm, Left upper leg   Body parts bathed by helper: Right lower leg, Left lower leg, Buttocks     Bathing assist Assist Level: Moderate Assistance - Patient 50 - 74%     Upper Body Dressing/Undressing Upper body dressing   What is the patient wearing?: Hospital gown only    Upper body assist Assist Level: Minimal Assistance - Patient > 75%    Lower Body Dressing/Undressing Lower body dressing      What is the patient wearing?: Hospital gown only, Incontinence brief     Lower body assist Assist for lower body dressing: Moderate Assistance - Patient 50 - 74%     Toileting Toileting    Toileting assist Assist for toileting: Maximal Assistance - Patient 25 - 49%     Transfers Chair/bed transfer  Transfers assist  Chair/bed  transfer activity did not occur: Safety/medical concerns (dizziness, pain, decreased balance)  Chair/bed transfer assist level: Maximal Assistance - Patient 25 - 49%     Locomotion Ambulation   Ambulation assist   Ambulation activity did not occur: Safety/medical concerns (unable to perform due to decline in mobility status following change in medical status)  Assist level: Minimal Assistance - Patient > 75% Assistive device: Walker-rolling Max distance: 38   Walk 10 feet activity   Assist  Walk 10 feet activity did not occur: Safety/medical concerns (dizziness, pain, decreased balance)  Assist level: Minimal Assistance - Patient > 75% Assistive device: Walker-rolling   Walk 50 feet activity   Assist Walk 50 feet with 2 turns activity did not occur: Safety/medical concerns         Walk 150 feet activity   Assist Walk 150 feet activity did not occur: Safety/medical concerns         Walk 10 feet on uneven surface  activity   Assist Walk 10 feet on uneven surfaces activity did not occur: Safety/medical concerns (unable to perform due to decline in mobility status following change in medical status)         Wheelchair     Assist Is the patient using a wheelchair?: Yes Type of Wheelchair: Manual Wheelchair activity did not occur: Safety/medical concerns (dizziness, pain, decreased balance)  Wheelchair assist level: Maximal Assistance - Patient 25 - 49% Max wheelchair distance: 10 ft    Wheelchair 50 feet with 2 turns activity    Assist    Wheelchair 50 feet with 2 turns activity did not occur: Safety/medical concerns (unable to perform due to decline in mobility status following change in medical status)       Wheelchair 150 feet activity     Assist  Wheelchair 150 feet activity did not occur: Safety/medical concerns (unable to perform due to decline in mobility status following change in medical status)       Blood pressure (!)  163/53, pulse (!) 51, temperature 97.8 F (36.6 C), resp. rate 17, height _0  (1.702 m), weight 68 kg, SpO2 90 %.  Medical Problem List and Plan: 1. Functional deficits secondary to left intertrochanteric femur fx after fall. Pt s/p IMN 09/25/22             -LLE WBAT             -patient may shower             -ELOS/Goals: 15-20 days  Continue CIR- PT and OT  Labs reviewed and stable  -d/c to [facility] today  12/5    2.  Hematuria: d/c Eliquis. Continue plavix 82m daily 3. Pain: continue SL morphine PRN for pain. tylenol and hydrocodone prn for pain             -robaxin prn for muscle spasms  11/8- pain limiting therapy- doesn't want opiates- mainly using tylenol-   11/9- d/w pt- she's willing to try tramadol 50 mg q6 hours prn- she's scared of Coding if takes Norco- I explained that Tramadol is mild and shouldn't cause any cognitive issues.   11/10- pain much better- not resolved, but better with tramadol- con't tramadol and tylenol prn  11/14- pain still doing better with tramadol  11/17- is worse today since had surgery yesterday-doesn't want any other meds  11/18 reports pain controlled, continue tramadol PRN  - 11/25 Dced sedating medications, no current pain.   12/3 appears comfortable at present  12/5 reports pain under control 4. Insomnia: continue morphine SL HS 5. Neuropsych/cognition: This patient is capable of making decisions on her own behalf. 6. Skin/Wound Care: dry dressings to left ear and hip  11/9- having some drainage- from L hip- is improved this Am- will monitor closely.  7. Fluids/Electrolytes/Nutrition: encourage appropriate PO             -pt's intake has inconsistent so far  - Improved 100-35% 11/25  12/2-3 intake variable, suboptimal  8. Nausea:              -might be positional or med related.              -also having associated gas             -pt told me she's been moving bowels, however I don't see a bm recorded             -will try sorbitol  today, dulcolax suppository prn             -protonix 11/8- has ileus- per #15 below  11/9- still having nausea- will change diet to clear liquid per pt request- and monitor  11/23- back to regular diet- with 1500cc fluid restriction  11/24- placed IVFs x 2 4 hours and recheck in AM  11/25 -NA, BUN, Cr stable on 75 cc/hr IVF; Dced HCTZ as would complicate further studies; recheck BMP in AM  12/2 Na+ 138--do not resume hctx, continue FR 9. Left lower lobe consolidation             -pneumonia ruled out. Likely atelectasis             -IS, OOB             -wean oxygen to off  11/7- will write order to wean O2.   11/27- O2 0.5-1L O2 due to pneumonia 10. Sinus bradycardia:             -on propranolol and verapamil at home             -continue to hold as HR in 50's  11/14- since still in 50s, will not restart  11. Essential HTN             -continue hctz  11/14- BP usually controlled but up today- will monitor for trend  11/15- somewhat elevated, but not sure if dropping- see #21           11/16- started hydralazine  11/17- just got hydralazine- is slightly better- might need it increased over weekend  -11/19 good control overall continue to  monitor  11/20- somewhat variable- due to ABLA likely  11/22- doing better  11/25 - DC HCTZ d/t IVF, hyponatremia as above - unable to give PO antihypertensives QHS Vitals:   10/27/22 2007 10/28/22 0421  BP: (!) 141/52 (!) 163/53  Pulse: (!) 57 (!) 51  Resp: 16 17  Temp: 98 F (36.7 C) 97.8 F (36.6 C)  SpO2: 98% 90%    12. Right middle lobe nodule 2.3 cm- should be considered primary lung CA until ruled out- also has nodule as well 65m on R middle lung- could be mets per CT scan.              -f/u as outpt  11/7- d/w daughter- she said no f/u at this time- due to pt's age.   11/8- daughter doesn't want to let pt know about tumors.    13. Mild leukocytosis- 11.2   11/6- afebrile- other than difficulty swallowing- says it's dry?; denies UTI  Sx's- if gets worse clinically, will recheck in AM- right now scheduled for Wed AM.   11/8- WBC down to 9.4-  14. Constipation  11/6- LBM on day of fall per pt and chart- will give Sorbitol after therapy and follow with SSE if needed.   11/7- had multiple BM's- didn't need SSE- per notes- feels cleaned out   11/19 LBM today, improved  12/2 bm this morning 15. ABLA  11/6- Down to 7.6- will recheck Wednesday to make sure not dropping further- and transfuse if required.   11/8- Hb 7.3- on edge of needing transfusion.   11/9- Hb up to 8.0-   11/10- Hb 7.7- will con't to monitor closely.   11/15- Hb 8.4  11/20- Hb 6.4- will transfuse as below  11/25 - HgB stable ~9, however with ongoing gross hematuria; holding PM eliqius, continue plavix  16. Post op ileus resolved BM x 3 11/10, 1 BM 11/12- advance diet as tolerated   -Recheck level tomorrow  11/21- New n/V-- ordered another KUB to look for ileus and put back on clear liquid diet- also ordered IV compazine, PO/PR phenergan and d/c'd Zofran since can cause constipation  11/22- wants ot wait til tomorrow to go back to regular diet- but can be changed anytime pt wants to back to regular diet  11/27- was back to regular diet 17. Thrush?  11/9- will add Magic mouthwash- don't see plaques, but tongue dry and furrowed. Says things taste 'wierd".   11/10- said magic mouthwash helping Sx's.  18. Leukocytosis improved   11/15- WBC down to 7.8  11/16- will recheck today to make sure not elevated  11/17- WBC stable at 8k  Recheck tomorrow  11/20- WBC down to 6.8  11/23- CRP 7.6- and ESR 21- doing great  11/25 - inc 13 from 10; allow 24 hours on new abx regimen; recheck in AM. Ucx NGTD, likely etiologies PNA vs. Ongoing L hip infection - both should be covered on daptomycin and rocephin  11/27- WBC back down to 9k- - ID also helping today due to questionable worsening pneumonia on CXR    Latest Ref Rng & Units 10/27/2022    3:57 AM 10/20/2022     4:30 AM 10/19/2022    4:19 AM  CBC  WBC 4.0 - 10.5 K/uL 5.6  9.7  11.6   Hemoglobin 12.0 - 15.0 g/dL 9.0  9.5  9.4   Hematocrit 36.0 - 46.0 % 27.2  28.8  27.2   Platelets 150 - 400 K/uL 215  301  296     19. Hypokalemia and low Mg  11/10- repleted up to 3.5- will put on Kcl 10 mEq daily for now and recheck Monday- since K+ still  3.5- will also check Mg Monday as well   11/14= Mg 1.6 and K+ 3.1- repleted again  yesterday by pharmacy for [place]- will recheck labs iN AM  11/15- K+ 4.2- will give IV Mg 2G for Mg 1.6- and recheck in AM  11/16- Mg 1.7- still low- will put on PO Mg 400 mg BID  Recheck K+ and MG tomorrow  11/20- K+ 3.8 and Mg 1.8- will con't regimen  11/22- K+ 3.4- repleted again IV- repleted yesterday IV since was 3.0-   11/23- KCL- will replete since K+ 3.2 today- will give 40 mEq x2 and recheck in AM- will also change daily KCL to 20 mEq- Mg level 1.7  11/24- K+ 3.6  12/3 K+ 4.0===>continue supp   -renal signed off. Recommends f/u bmet in a week 20. Swallowing issues  11/14- appears to be thrush- will start Diflucan- due to renal issues, will need to be 50 mg daily- x 7 days doses-   11/15- reports things taste better and was able ot swallow some this AM of breakfast-con't diflucan  11/19 reports improved, continue to monitor  11/23- resolved- Diflucan finished  21. Dizziness/lightheadedness/presyncope  11/15- will have pt get orthostatics checked by nursing and therapy- BP on high side, so not sure if Sx's due to BP being high or dropping.   11/16- added hydralazine 25 mg TID- is somewhat better today  11/20- BP a little on low side- likely due to ABLA- will hold BP meds if need be.  22. Infection of L hip?  11/16- still having drainage from L hip- spoke to Ortho- they are taking her to OR today at 5pm for wash out-   11/17- Gram stains negative so far- waiting for Cx's- surgeon gave Ancef x2 IV and will monitor Cultures  11/19 wound culture growing staph,Continue  vancomycin, surgery advising ID consult, called ID to discuss -consult placed  11/20- con't IV Vanc per ID- Cx's still pending final. 11/21- changed to Daptomycin, per ID- will double check labs today- CK is pending- didn't get done yesterday- will check labs today as well  11/22- CK looking good- 23- con't Daptomycin- stop date is 11/07/22 per ID 12/5 Katharine Look spoke with Dr Linus Salmons yesterday, Linezolid through 11/08/22 with weekly CBC and CMP and monitoring  23. Urinary retention  11/16- is new- will check U/A and Cx- and labs- CBC- with diff and CMP just to make sure no other issues  11/17- U/A (-) and doesn't have elevated WBC- mild left shift however- will start Flomax 0.4 mg q supper  11/8 continent PVRs a little improved today< 364m, continue to monitor, continue flomax, IC if > 350 ml  11/23- placed foley since having such high volumes and will leave in til early next week- will try voiding trial Monday/Tuesday- will also increase Flomax to 0.8 q supper  11/24- will treat possible UTI- and possible pneumonia- WBC up slightly to 10.6 with L shift  11/25: See above; ongoing hematuria w/o growth on Ucx; ? UTI  24. ABLA anemia-  11/20- down to 6.4- will transfuse 2 units since so low.    11/21- labs today- pending  11/22- Hb 9.7  11/25: Held QHS eliquis 2.5 for gross hematuria; cbc in AM  25. SOB/questionable pneumonia/bibasilar opacities  11/21- pt c/o SOB- O2 sats  98% on 2L- has been on O2 since surgery last week- will check CXR and gave IV Lasix 40 mg x1 - and let pt know we need her to void. If CXR Negative, will need CTA of chest to look for PE.   11/22- down to 0.5 L of O2- and satting well- actually CO2 was too high on 2L- so reduced due to CO2 retention- SOB improved- of note, no leukocytosis  11/23- off O2 completely- and satting well  11/24- more SOB- still has Bibasilar opacities- hadn't treated earlier because lack of elevated WBC and afebrile- will treat now- also treat possible  UTI per IM with Rocephin 1G q24 hours and wait for U Cx.   - 11/25: See above; if WBC continues uptrend in AM despite broad IV abx and IVF, will get Bcx 2 and re-engage IM, ID for infectious workup, hyponatremia, and hematuria  11/27- WBC down to 9.7, however CXR looks worse- on Rocephin- have asked ID to come take a look at pt- they will come by today.  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 Ceftriaxone completed for pneumonia treatment. Discussed this with daughter. Continue Linezolid  27. Bradycardia: continue to monitor HR TID, improved 28. Bilateral lower extremity edema: continue lasix IV prn as needed at SNF.    >30 minutes spent in discharge of patient including review of medications and follow-up appointments, physical examination, and in answering all patient's questions   LOS: 30 days A FACE TO FACE EVALUATION WAS [place] 10/28/2022, 8:24 AM

## 2022-10-29 ENCOUNTER — Other Ambulatory Visit: Payer: Self-pay | Admitting: *Deleted

## 2022-10-29 DIAGNOSIS — R000 Tachycardia, unspecified: Secondary | ICD-10-CM | POA: Diagnosis not present

## 2022-10-29 DIAGNOSIS — I639 Cerebral infarction, unspecified: Secondary | ICD-10-CM | POA: Diagnosis not present

## 2022-10-29 DIAGNOSIS — I10 Essential (primary) hypertension: Secondary | ICD-10-CM | POA: Diagnosis not present

## 2022-10-29 DIAGNOSIS — Z4789 Encounter for other orthopedic aftercare: Secondary | ICD-10-CM | POA: Diagnosis not present

## 2022-10-29 DIAGNOSIS — E785 Hyperlipidemia, unspecified: Secondary | ICD-10-CM | POA: Diagnosis not present

## 2022-10-29 DIAGNOSIS — S72142D Displaced intertrochanteric fracture of left femur, subsequent encounter for closed fracture with routine healing: Secondary | ICD-10-CM | POA: Diagnosis not present

## 2022-10-29 DIAGNOSIS — Z9181 History of falling: Secondary | ICD-10-CM | POA: Diagnosis not present

## 2022-10-29 DIAGNOSIS — T8149XS Infection following a procedure, other surgical site, sequela: Secondary | ICD-10-CM | POA: Diagnosis not present

## 2022-10-29 NOTE — Patient Outreach (Signed)
Per [facility] Anna Weiss recently admitted to [facility]. Screening for potential [facility] care coordination services as benefit of insurance plan and PCP.  Update received from [facility] therapy manager, Wilhemena Durie. Anna Weiss is from home alone. Has supportive daughter who stayed with her several nights a week.    Will continue to follow and plan outreach to discuss potential [facility] care coordination services as appropriate.  Marthenia Rolling, MSN, RN,BSN [place] Acute Care Coordinator [phone] (Direct dial)

## 2022-10-31 ENCOUNTER — Emergency Department (HOSPITAL_COMMUNITY)
Admission: EM | Admit: 2022-10-31 | Discharge: 2022-10-31 | Disposition: A | Discharge status: Rehabilitation Facility | Payer: Medicare Other | Attending: Emergency Medicine | Admitting: Emergency Medicine

## 2022-10-31 ENCOUNTER — Emergency Department (HOSPITAL_COMMUNITY): Payer: Medicare Other

## 2022-10-31 ENCOUNTER — Encounter (HOSPITAL_COMMUNITY): Payer: Self-pay

## 2022-10-31 ENCOUNTER — Other Ambulatory Visit: Payer: Self-pay

## 2022-10-31 DIAGNOSIS — F0390 Unspecified dementia without behavioral disturbance: Secondary | ICD-10-CM | POA: Diagnosis not present

## 2022-10-31 DIAGNOSIS — R410 Disorientation, unspecified: Secondary | ICD-10-CM | POA: Insufficient documentation

## 2022-10-31 DIAGNOSIS — J189 Pneumonia, unspecified organism: Secondary | ICD-10-CM | POA: Insufficient documentation

## 2022-10-31 DIAGNOSIS — Z7189 Other specified counseling: Secondary | ICD-10-CM

## 2022-10-31 DIAGNOSIS — R0600 Dyspnea, unspecified: Secondary | ICD-10-CM | POA: Diagnosis not present

## 2022-10-31 DIAGNOSIS — R1310 Dysphagia, unspecified: Secondary | ICD-10-CM | POA: Diagnosis not present

## 2022-10-31 DIAGNOSIS — Z515 Encounter for palliative care: Secondary | ICD-10-CM

## 2022-10-31 DIAGNOSIS — J90 Pleural effusion, not elsewhere classified: Secondary | ICD-10-CM | POA: Diagnosis not present

## 2022-10-31 DIAGNOSIS — R404 Transient alteration of awareness: Secondary | ICD-10-CM | POA: Diagnosis not present

## 2022-10-31 DIAGNOSIS — Z7902 Long term (current) use of antithrombotics/antiplatelets: Secondary | ICD-10-CM | POA: Diagnosis not present

## 2022-10-31 DIAGNOSIS — R4182 Altered mental status, unspecified: Secondary | ICD-10-CM | POA: Diagnosis not present

## 2022-10-31 DIAGNOSIS — I959 Hypotension, unspecified: Secondary | ICD-10-CM | POA: Diagnosis not present

## 2022-10-31 DIAGNOSIS — I10 Essential (primary) hypertension: Secondary | ICD-10-CM | POA: Diagnosis not present

## 2022-10-31 DIAGNOSIS — R000 Tachycardia, unspecified: Secondary | ICD-10-CM | POA: Diagnosis not present

## 2022-10-31 LAB — CBC WITH DIFFERENTIAL/PLATELET
Abs Immature Granulocytes: 0.03 10*3/uL (ref 0.00–0.07)
Basophils Absolute: 0.1 10*3/uL (ref 0.0–0.1)
Basophils Relative: 1 %
Eosinophils Absolute: 0.1 10*3/uL (ref 0.0–0.5)
Eosinophils Relative: 1 %
HCT: 30.4 % — ABNORMAL LOW (ref 36.0–46.0)
Hemoglobin: 9.8 g/dL — ABNORMAL LOW (ref 12.0–15.0)
Immature Granulocytes: 0 %
Lymphocytes Relative: 26 %
Lymphs Abs: 2 10*3/uL (ref 0.7–4.0)
MCH: 34.4 pg — ABNORMAL HIGH (ref 26.0–34.0)
MCHC: 32.2 g/dL (ref 30.0–36.0)
MCV: 106.7 fL — ABNORMAL HIGH (ref 80.0–100.0)
Monocytes Absolute: 0.6 10*3/uL (ref 0.1–1.0)
Monocytes Relative: 8 %
Neutro Abs: 4.8 10*3/uL (ref 1.7–7.7)
Neutrophils Relative %: 64 %
Platelets: 154 10*3/uL (ref 150–400)
RBC: 2.85 MIL/uL — ABNORMAL LOW (ref 3.87–5.11)
RDW: 17.2 % — ABNORMAL HIGH (ref 11.5–15.5)
WBC: 7.5 10*3/uL (ref 4.0–10.5)
nRBC: 0 % (ref 0.0–0.2)

## 2022-10-31 LAB — URINALYSIS, ROUTINE W REFLEX MICROSCOPIC
Bilirubin Urine: NEGATIVE
Glucose, UA: NEGATIVE mg/dL
Hgb urine dipstick: NEGATIVE
Ketones, ur: NEGATIVE mg/dL
Leukocytes,Ua: NEGATIVE
Nitrite: NEGATIVE
Protein, ur: NEGATIVE mg/dL
Specific Gravity, Urine: 1.016 (ref 1.005–1.030)
pH: 7 (ref 5.0–8.0)

## 2022-10-31 LAB — COMPREHENSIVE METABOLIC PANEL
ALT: 29 U/L (ref 0–44)
AST: 25 U/L (ref 15–41)
Albumin: 2.8 g/dL — ABNORMAL LOW (ref 3.5–5.0)
Alkaline Phosphatase: 88 U/L (ref 38–126)
Anion gap: 7 (ref 5–15)
BUN: 24 mg/dL — ABNORMAL HIGH (ref 8–23)
CO2: 28 mmol/L (ref 22–32)
Calcium: 8.9 mg/dL (ref 8.9–10.3)
Chloride: 105 mmol/L (ref 98–111)
Creatinine, Ser: 0.68 mg/dL (ref 0.44–1.00)
GFR, Estimated: >60 mL/min (ref >60)
Glucose, Bld: 99 mg/dL (ref 70–99)
Potassium: 3.8 mmol/L (ref 3.5–5.1)
Sodium: 140 mmol/L (ref 135–145)
Total Bilirubin: 1.1 mg/dL (ref 0.3–1.2)
Total Protein: 5.3 g/dL — ABNORMAL LOW (ref 6.5–8.1)

## 2022-10-31 LAB — MAGNESIUM: Magnesium: 2.1 mg/dL (ref 1.7–2.4)

## 2022-10-31 LAB — CBG MONITORING, ED: Glucose-Capillary: 96 mg/dL (ref 70–99)

## 2022-10-31 MED ORDER — ACETAMINOPHEN 650 MG RE SUPP: 650.0000 mg | Freq: Four times a day (QID) | RECTAL | Status: DC | PRN

## 2022-10-31 MED ORDER — LORAZEPAM 2 MG/ML IJ SOLN: 1.0000 mg | INTRAMUSCULAR | Status: DC | PRN

## 2022-10-31 MED ORDER — HALOPERIDOL LACTATE 2 MG/ML PO CONC: 0.5000 mg | ORAL | Status: DC | PRN

## 2022-10-31 MED ORDER — ACETAMINOPHEN 325 MG PO TABS: 650.0000 mg | ORAL_TABLET | Freq: Four times a day (QID) | ORAL | Status: DC | PRN

## 2022-10-31 MED ORDER — SODIUM CHLORIDE 0.9 % IV BOLUS
500.0000 mL | Freq: Once | INTRAVENOUS | Status: AC
  Administered 2022-10-31: 500 mL via INTRAVENOUS

## 2022-10-31 MED ORDER — BIOTENE DRY MOUTH MT LIQD: 15.0000 mL | OROMUCOSAL | Status: DC | PRN

## 2022-10-31 MED ORDER — BISACODYL 10 MG RE SUPP: 10.0000 mg | Freq: Every day | RECTAL | Status: DC | PRN

## 2022-10-31 MED ORDER — GLYCOPYRROLATE 0.2 MG/ML IJ SOLN: 0.2000 mg | INTRAMUSCULAR | Status: DC | PRN

## 2022-10-31 MED ORDER — ONDANSETRON 4 MG PO TBDP: 4.0000 mg | ORAL_TABLET | Freq: Four times a day (QID) | ORAL | Status: DC | PRN

## 2022-10-31 MED ORDER — POLYVINYL ALCOHOL 1.4 % OP SOLN: 1.0000 [drp] | Freq: Four times a day (QID) | OPHTHALMIC | Status: DC | PRN

## 2022-10-31 MED ORDER — MORPHINE SULFATE (CONCENTRATE) 10 MG/0.5ML PO SOLN: 5.0000 mg | ORAL | Status: DC | PRN

## 2022-10-31 MED ORDER — LORAZEPAM 2 MG/ML PO CONC: 1.0000 mg | ORAL | Status: DC | PRN

## 2022-10-31 MED ORDER — HALOPERIDOL LACTATE 5 MG/ML IJ SOLN: 0.5000 mg | INTRAMUSCULAR | Status: DC | PRN

## 2022-10-31 MED ORDER — MORPHINE SULFATE (PF) 2 MG/ML IV SOLN: 1.0000 mg | INTRAVENOUS | Status: DC | PRN

## 2022-10-31 MED ORDER — HALOPERIDOL 0.5 MG PO TABS: 0.5000 mg | ORAL_TABLET | ORAL | Status: DC | PRN

## 2022-10-31 MED ORDER — GLYCOPYRROLATE 1 MG PO TABS: 1.0000 mg | ORAL_TABLET | ORAL | Status: DC | PRN

## 2022-10-31 MED ORDER — ONDANSETRON HCL 4 MG/2ML IJ SOLN: 4.0000 mg | Freq: Four times a day (QID) | INTRAMUSCULAR | Status: DC | PRN

## 2022-10-31 MED ORDER — LORAZEPAM 1 MG PO TABS: 1.0000 mg | ORAL_TABLET | ORAL | Status: DC | PRN

## 2022-10-31 NOTE — Progress Notes (Signed)
TOC CSW contacted [facility] who will be accepting pt back.  Roselee Nova with [facility] will consult with pt tomorrow at [facility].  Reegan Bouffard Tarpley-Carter, MSW, LCSW-A Pronouns:  She/Her/Hers [facility] Clinical Social Worker Direct Number:  [phone] [email or web address]

## 2022-10-31 NOTE — ED Notes (Signed)
Patient transported to CT 

## 2022-10-31 NOTE — ED Notes (Signed)
Report called to [facility], report received by [facility] LPN

## 2022-10-31 NOTE — ED Triage Notes (Signed)
Pt from [facility]. Arrived to facility aprox 3 days ago. Staff told [facility] that pt has been AMS during her whole stay. PT is arousable to speech, but minimally interactive staff at ED. Monitor shows Afib

## 2022-10-31 NOTE — Progress Notes (Signed)
Manufacturing engineer [facility]) Hospital Liaison Note  Referral received for patient/family interest in [facility]. Chart under review by [facility] physician.   Hospice eligibility pending.   Plan is to return to [facility] and [facility] will evaluate/see for hospice at facility.   No DME needs at this time.   Please call with any questions/concerns. Thank you  Roselee Nova, [place] Hospital Liaison [phone]

## 2022-10-31 NOTE — ED Notes (Signed)
Ptar called, 3rd in line

## 2022-10-31 NOTE — TOC Initial Note (Signed)
Transition of Care [facility]) - Initial/Assessment Note    Patient Details  Name: Anna Weiss MRN: [id number] Date of Birth: 1925/05/23  Transition of Care [facility]) CM/SW Contact:    Verdell Carmine, RN Phone Number: 10/31/2022, 4:16 PM  Clinical Narrative:                    Consult received for [facility] place, palliative care consulted. Patient  has been at [facility] for 3 days post hospital stay has been altered. Provider spoke with family. Messaged [facility] for bed availability     Patient Goals and CMS Choice        Expected Discharge Plan and [place] residential                                            Prior Living Arrangements/Services                       Activities of Daily Living      Permission Sought/Granted                  Emotional Assessment              Admission diagnosis:  FTT Patient Active Problem List   Diagnosis Date Noted   Surgical site infection 10/28/2022   Drainage from wound 10/10/2022   Fracture, intertrochanteric, left femur, sequela 09/28/2022   Sinus bradycardia 09/25/2022   Left lower lobe consolidation ([place]) 09/25/2022   Essential hypertension 09/25/2022   Left parietal scalp hematoma, initial encounter 09/25/2022   Displaced intertrochanteric fracture of left femur, initial encounter for closed fracture ([place]) 09/24/2022   Hip fracture ([place]) 09/24/2022   Rib fracture 04/08/2022   Lumbar compression fracture ([place]) 04/08/2022   Pain in right lower leg 05/14/2018   PCP:  Mayra Neer, MD Pharmacy:   [place], [place] [address] [place] [address] Phone: [phone] Fax: [place] [address] Phone: [phone] Fax: [phone]  [facility] [address] [place] [address] Phone: [phone]  Fax: [phone]     Social Determinants of Health (SDOH) Interventions    Readmission Risk Interventions     No data to display

## 2022-10-31 NOTE — ED Notes (Signed)
PTAR at bedside 

## 2022-10-31 NOTE — ED Provider Notes (Signed)
[facility] EMERGENCY DEPARTMENT Provider Note   CSN: [id number] Arrival date & time: 10/31/22  1459     History  No chief complaint on file.   Anna Weiss is a 88 y.o. female history of dementia, here presenting with altered mental status.  Patient had a long and complicated admission after hip fracture.  Patient became bacteremic and was on IV antibiotics.  Patient eventually saw infectious disease and was put on linezolid and PICC line was removed.  Patient refused to eat and there was a conference with family with palliative care and patient was made DNR.  However patient was not full comfort care at that time.  Patient was just discharged from rehab to SNF about 3 days ago.  Patient has not been responsive the whole time.  Patient is arousable only to speech and is not interacting with the staff during this whole time.  Patient did not fall out of bed.  Patient was sent here by the SNF due to persistent confusion and failure to thrive.  The history is provided by the patient.       Home Medications Prior to Admission medications   Medication Sig Start Date End Date Taking? Authorizing Provider  acetaminophen (TYLENOL) 325 MG tablet Take 2 tablets (650 mg total) by mouth every 6 (six) hours as needed for mild pain. 10/27/22  Yes Setzer, Edman Circle, PA-C  ARTIFICIAL TEAR OP Place 1 drop into both eyes 2 (two) times daily.   Yes [provider]  hydrALAZINE (APRESOLINE) 25 MG tablet Take 1 tablet (25 mg total) by mouth every 8 (eight) hours. Patient taking differently: Take 25 mg by mouth every 8 (eight) hours. (0600, 1400, 2200) 10/27/22  Yes Setzer, Edman Circle, PA-C  antiseptic oral rinse (BIOTENE) LIQD 15 mLs by Mouth Rinse route as needed for dry mouth. 10/27/22   Setzer, Edman Circle, PA-C  bisacodyl (DULCOLAX) 10 MG suppository Place 1 suppository (10 mg total) rectally daily as needed for moderate constipation. 10/27/22   Setzer, Edman Circle, PA-C  clopidogrel  (PLAVIX) 75 MG tablet Take 1 tablet (75 mg total) by mouth daily. 10/28/22   Setzer, Edman Circle, PA-C  famotidine (PEPCID) 40 MG/5ML suspension Place 5 mLs (40 mg total) into feeding tube 2 (two) times daily. 10/27/22   Setzer, Edman Circle, PA-C  feeding supplement (ENSURE ENLIVE / ENSURE PLUS) LIQD Take 237 mLs by mouth 2 (two) times daily between meals. 10/27/22   Setzer, Edman Circle, PA-C  iron polysaccharides (NIFEREX) 150 MG capsule Take 1 capsule (150 mg total) by mouth daily. 10/28/22   Setzer, Edman Circle, PA-C  levothyroxine (SYNTHROID) 50 MCG tablet Take 1 tablet (50 mcg total) by mouth daily at 6 (six) AM. 10/28/22   Setzer, Edman Circle, PA-C  linezolid (ZYVOX) 600 MG tablet Take 1 tablet (600 mg total) by mouth 2 (two) times daily for 11 days. 10/27/22 11/08/22  Thayer Headings, MD  Morphine Sulfate (MORPHINE CONCENTRATE) 10 MG/0.5ML SOLN concentrated solution May place 0.13 mLs (2.6 mg total) under the tongue every 4 (four) hours as needed for severe pain or shortness of breath. 10/27/22   Setzer, Edman Circle, PA-C  potassium chloride (KLOR-CON) 20 MEQ packet Take 40 mEq by mouth daily. 10/27/22   Setzer, Edman Circle, PA-C  tamsulosin (FLOMAX) 0.4 MG CAPS capsule Take 2 capsules (0.8 mg total) by mouth daily after supper. 10/27/22   Setzer, Edman Circle, PA-C      Allergies    Aspirin  Review of Systems   Review of Systems  Psychiatric/Behavioral:  Positive for confusion.   All other systems reviewed and are negative.   Physical Exam Updated Vital Signs BP (!) 157/63 (BP Location: Right Wrist)   Pulse 65   Temp 98.2 F (36.8 C) (Oral)   Resp 15   SpO2 100%  Physical Exam Vitals and nursing note reviewed.  Constitutional:      Comments: Confused and minimally responsive only to voice.  Contracted  HENT:     Head: Normocephalic.     Nose: Nose normal.     Mouth/Throat:     Mouth: Mucous membranes are dry.  Eyes:     Extraocular Movements: Extraocular movements intact.     Pupils: Pupils are equal,  round, and reactive to light.  Cardiovascular:     Rate and Rhythm: Normal rate and regular rhythm.     Pulses: Normal pulses.     Heart sounds: Normal heart sounds.  Pulmonary:     Effort: Pulmonary effort is normal.     Breath sounds: Normal breath sounds.  Abdominal:     General: Abdomen is flat.     Palpations: Abdomen is soft.     Comments: Foley in place  Musculoskeletal:     Cervical back: Normal range of motion and neck supple.     Comments: Contracted and no signs of trauma  Skin:    Capillary Refill: Capillary refill takes less than 2 seconds.  Neurological:     Comments: Confused and ANO x 0.  Moving all extremities.  Psychiatric:     Comments: Unable      ED Results / Procedures / Treatments   Labs (all labs ordered are listed, but only abnormal results are displayed) Labs Reviewed  CBC WITH DIFFERENTIAL/PLATELET - Abnormal; Notable for the following components:      Result Value   RBC 2.85 (*)    Hemoglobin 9.8 (*)    HCT 30.4 (*)    MCV 106.7 (*)    MCH 34.4 (*)    RDW 17.2 (*)    All other components within normal limits  COMPREHENSIVE METABOLIC PANEL  MAGNESIUM  URINALYSIS, ROUTINE W REFLEX MICROSCOPIC  CBG MONITORING, ED    EKG EKG Interpretation  Date/Time:  Friday October 31 2022 15:11:10 EST Ventricular Rate:  65 PR Interval:    QRS Duration: 99 QT Interval:  420 QTC Calculation: 437 R Axis:   112 Text Interpretation: Atrial fibrillation Right axis deviation Borderline T abnormalities, anterior leads No significant change since last tracing Confirmed by Wandra Arthurs [phone]) on 10/31/2022 3:27:16 PM  Radiology DG Chest Port 1 View  Result Date: 10/31/2022 CLINICAL DATA:  Altered level of consciousness EXAM: PORTABLE CHEST 1 VIEW COMPARISON:  10/19/2022 FINDINGS: Single frontal view of the chest demonstrates a stable cardiac silhouette. Persistent bibasilar consolidation and effusions, left greater than right. The multifocal airspace disease  seen on prior study has resolved in the interim. No pneumothorax. No acute bony abnormalities. IMPRESSION: 1. Persistent bilateral pleural effusions and bibasilar consolidation, left greater than right, favor atelectasis. 2. Interval resolution of the multifocal bilateral airspace disease seen previously. Electronically Signed   By: Randa Ngo M.D.   On: 10/31/2022 15:35    Procedures Procedures    Medications Ordered in ED Medications  sodium chloride 0.9 % bolus 500 mL (500 mLs Intravenous New Bag/Given 10/31/22 1529)    ED Course/ Medical Decision Making/ A&P  Medical Decision Making Anna Weiss is a 88 y.o. female here with confusion.  She has been confused since she got to the nursing home.  Per the discharge summary, patient has been not eating or drinking while she is in the hospital.  Patient does look dehydrated.  The main issue is that patient is declining and likely needs inpatient hospice.  I consulted palliative care to address the issue with family.  Will also get CBC and CMP and magnesium level and will hydrate patient.  6:34 PM I have reviewed patient's labs and dependently interpreted CT scan.  Patient CT head was unremarkable and chest x-ray shows stable bilateral pleural effusions.  Patient is already on linezolid for bacteremia and pneumonia.  Patient's white blood cell count is normal.  Urinalysis is unremarkable.  Had extensive discussion with daughter as well as with social work and also palliative care.  Daughter wants her comfortable at this point.  We tried to find her bed at [facility] but there was no beds available.  They will try and send a hospice nurse out to [facility] where she currently resides.  She does not meet any criteria for admission.  At this point, will discharge back to [facility] and hospice will follow-up with patient.  Problems Addressed: Confusion: chronic illness or injury HCAP (healthcare-associated pneumonia):  chronic illness or injury  Amount and/or Complexity of Data Reviewed Labs: ordered. Decision-making details documented in ED Course. Radiology: ordered and independent interpretation performed. Decision-making details documented in ED Course.    Final Clinical Impression(s) / ED Diagnoses Final diagnoses:  None    Rx / DC Orders ED Discharge Orders     None         Drenda Freeze, MD 10/31/22 1836

## 2022-10-31 NOTE — Consult Note (Signed)
Consultation Note Date: 10/31/2022   Patient Name: Anna Weiss  DOB: 30-Oct-1925  MRN: [id number]  Age / Sex: 88 y.o., female  PCP: Mayra Neer, MD Referring Physician: Drenda Freeze, MD  Reason for Consultation: Establishing goals of care and Hospice Evaluation  HPI/Patient Profile: 88 y.o. female  with past medical history of hypertension, remote stroke, fall and hip fracture 09/24/22 with resultant parietal scalp hematoma, remote lacunar infarcts, left comminuted intertrochanteric fracture s/p surgical repair 09/25/22 and hospital course complicated by left hip infection requiring surgical irrigation and debridement, urinary retention, postop ileus, thrush, worsening pneumonia seen in the ED on 10/31/2022 with altered mental status, overall failure to thrive from SNF.   PMT has been consulted to assist with "hospice evaluation."  Clinical Assessment and Goals of Care:  I have reviewed medical records including EPIC notes, labs and imaging, received report from RN, assessed the patient and then had a phone conversation with patient's daughter [name] to discuss diagnosis prognosis, [place], EOL wishes, disposition and options.  I introduced Palliative Medicine as specialized medical care for people living with serious illness. It focuses on providing relief from the symptoms and stress of a serious illness. The goal is to improve quality of life for both the patient and the family.  We discussed a brief life review of the patient and then focused on their current illness.  The natural disease trajectory and expectations at EOL were discussed.  I attempted to elicit values and goals of care important to the patient.    Medical History Review and Understanding:  We discussed patient's recent hospitalization and complications.  We discussed patient's failure to thrive and poor oral intake.  Patient's daughter has a  good understanding of the severity of her illness.  Social History: Patient is at [facility] for rehab after inpatient rehab.  She has a son and a daughter.  Discussion: Patient's daughter is not surprised by current ED evaluation as she felt patient should have never left CIR earlier this week.  We reviewed her prior conversations with my colleague Vinie Sill, NP and discussed that admission for IV fluids would likely prolong her suffering rather than her life, as she would never want a feeding tube.  Patient's daughter understands that patient's body is tired and just went through this when they lost patient's husband in March.  Patient's son has not really seen her since she has been in this condition.  I recommended comfort care and referral to hospice.  We discussed likely prognosis of days to weeks and patient's daughter shares concerns that she would not be able to take her home with hospice.  We discussed what it would look like to refer to residential hospice, as patient's daughter is concerned that this may be too much for the facility to handle.  She verbalizes her agreement, stating "I think it has come to that."  The hospice referral process was reviewed.   The difference between aggressive medical intervention and comfort care was considered in light of the patient's goals of care. Hospice and Palliative Care services outpatient were explained and offered.   Discussed the importance of continued conversation with family and the medical providers regarding overall plan of care and treatment options, ensuring decisions are within the context of the patient's values and GOCs.   Questions and concerns were addressed. The family was encouraged to call with questions or concerns.  PMT will continue to support holistically.   SUMMARY OF RECOMMENDATIONS   -Continue DNR -Full  comfort measures in ED after discussion with patient's daughter Morphine PRN for pain/air hunger/comfort Robinul PRN for  excessive secretions Ativan PRN for agitation/anxiety Zofran PRN for nausea Liquifilm tears PRN for dry eyes Haldol PRN for agitation/anxiety May have comfort feeding Comfort cart for family Unrestricted visitations in the setting of EOL (per policy) Oxygen PRN 2L or less for comfort. No escalation.   -Patient's daughter is interested in referral to residential hospice, [facility] is preferred.  TOC was consulted for assistance -Per [facility] hospital liaison, patient is not a candidate for hospice facility at this time.  She will return to [facility] for hospice evaluation tomorrow -Psychosocial and emotional support provided -PMT will continue to follow and support as needed  Prognosis:  < 2 weeks  Discharge Planning: [place] with Hospice      Primary Diagnoses: Present on Admission: **None**  Physical Exam Vitals and nursing note reviewed.  Constitutional:      General: She is not in acute distress.    Appearance: She is ill-appearing.     Interventions: Nasal cannula in place.     Comments: 2L  Cardiovascular:     Rate and Rhythm: Normal rate.  Pulmonary:     Effort: Pulmonary effort is normal. No respiratory distress.  Neurological:     Mental Status: She is lethargic, disoriented and confused.    Vital Signs: BP (!) 157/63 (BP Location: Right Wrist)   Pulse 65   Temp 98.2 F (36.8 C) (Oral)   Resp 15   SpO2 100%  Pain Scale: 0-10   Pain Score: 0-No pain   SpO2: SpO2: 100 % O2 Device:SpO2: 100 % O2 Flow Rate: .O2 Flow Rate (L/min): 2 L/min   Palliative Assessment/Data: 20%    MDM: high   Sigfredo Schreier Johnnette Litter, PA-C  Palliative Medicine Team Team phone # [phone]  Thank you for allowing the Palliative Medicine Team to assist in the care of this patient. Please utilize secure chat with additional questions, if there is no response within 30 minutes please call the above phone number.  Palliative Medicine Team providers are  available by phone from 7am to 7pm daily and can be reached through the team cell phone.  Should this patient require assistance outside of these hours, please call the patient's attending physician.

## 2022-10-31 NOTE — Discharge Instructions (Signed)
Your lab work and CT scans were stable.  Your chest x-ray showed persistent pneumonia.  Please continue your linezolid as prescribed by your doctor when you are in the hospital.  Hospice nurse will come and follow-up with you  Social work will also follow-up regarding inpatient hospice  See your doctor for follow-up  Return to ER if she has worsening lethargy, trouble breathing, fall

## 2022-11-03 DIAGNOSIS — I10 Essential (primary) hypertension: Secondary | ICD-10-CM | POA: Diagnosis not present

## 2022-11-03 DIAGNOSIS — T8149XS Infection following a procedure, other surgical site, sequela: Secondary | ICD-10-CM | POA: Diagnosis not present

## 2022-11-03 DIAGNOSIS — R0600 Dyspnea, unspecified: Secondary | ICD-10-CM | POA: Diagnosis not present

## 2022-11-05 DIAGNOSIS — E785 Hyperlipidemia, unspecified: Secondary | ICD-10-CM | POA: Diagnosis not present

## 2022-11-05 DIAGNOSIS — Z9181 History of falling: Secondary | ICD-10-CM | POA: Diagnosis not present

## 2022-11-05 DIAGNOSIS — R000 Tachycardia, unspecified: Secondary | ICD-10-CM | POA: Diagnosis not present

## 2022-11-05 DIAGNOSIS — Z4789 Encounter for other orthopedic aftercare: Secondary | ICD-10-CM | POA: Diagnosis not present

## 2022-11-05 DIAGNOSIS — S72142D Displaced intertrochanteric fracture of left femur, subsequent encounter for closed fracture with routine healing: Secondary | ICD-10-CM | POA: Diagnosis not present

## 2022-11-05 DIAGNOSIS — I639 Cerebral infarction, unspecified: Secondary | ICD-10-CM | POA: Diagnosis not present

## 2022-11-05 DIAGNOSIS — I10 Essential (primary) hypertension: Secondary | ICD-10-CM | POA: Diagnosis not present

## 2022-11-05 DIAGNOSIS — T8149XS Infection following a procedure, other surgical site, sequela: Secondary | ICD-10-CM | POA: Diagnosis not present

## 2022-11-06 ENCOUNTER — Other Ambulatory Visit: Payer: Self-pay | Admitting: *Deleted

## 2022-11-06 DIAGNOSIS — I10 Essential (primary) hypertension: Secondary | ICD-10-CM | POA: Diagnosis not present

## 2022-11-06 DIAGNOSIS — R1310 Dysphagia, unspecified: Secondary | ICD-10-CM | POA: Diagnosis not present

## 2022-11-06 NOTE — Patient Outreach (Signed)
[place] Coordinator follow up. Mrs. Barb resides in [place] [facility]. Screening for potential [facility] care coordination services as benefit of insurance plan and PCP.   Met with SNF social workers at [facility]. Mrs. Bhakta had an vomiting episode this week. Has been working with therapy. Palliative referral to be made. Family did not want hospice yet. Transition plan likely LTC.   Will continue to follow.   Marthenia Rolling, MSN, RN,BSN [place] Acute Care Coordinator [phone] (Direct dial)

## 2022-11-07 DIAGNOSIS — I10 Essential (primary) hypertension: Secondary | ICD-10-CM | POA: Diagnosis not present

## 2022-11-07 DIAGNOSIS — R0600 Dyspnea, unspecified: Secondary | ICD-10-CM | POA: Diagnosis not present

## 2022-11-11 ENCOUNTER — Inpatient Hospital Stay: Payer: Medicare Other | Admitting: Internal Medicine

## 2022-11-12 ENCOUNTER — Other Ambulatory Visit: Payer: Self-pay | Admitting: *Deleted

## 2022-11-12 DIAGNOSIS — E785 Hyperlipidemia, unspecified: Secondary | ICD-10-CM | POA: Diagnosis not present

## 2022-11-12 DIAGNOSIS — S72142D Displaced intertrochanteric fracture of left femur, subsequent encounter for closed fracture with routine healing: Secondary | ICD-10-CM | POA: Diagnosis not present

## 2022-11-12 DIAGNOSIS — Z4789 Encounter for other orthopedic aftercare: Secondary | ICD-10-CM | POA: Diagnosis not present

## 2022-11-12 DIAGNOSIS — I639 Cerebral infarction, unspecified: Secondary | ICD-10-CM | POA: Diagnosis not present

## 2022-11-12 DIAGNOSIS — I10 Essential (primary) hypertension: Secondary | ICD-10-CM | POA: Diagnosis not present

## 2022-11-12 DIAGNOSIS — Z9181 History of falling: Secondary | ICD-10-CM | POA: Diagnosis not present

## 2022-11-12 DIAGNOSIS — T8149XS Infection following a procedure, other surgical site, sequela: Secondary | ICD-10-CM | POA: Diagnosis not present

## 2022-11-12 DIAGNOSIS — R000 Tachycardia, unspecified: Secondary | ICD-10-CM | POA: Diagnosis not present

## 2022-11-12 NOTE — Patient Outreach (Signed)
[facility]- Acute Care Coordinator follow up. [facility] resides in [facility] [place].   Secure communication sent to SNF social worker team and therapy manager to inquire about transition plans.   Will continue to follow.   Marthenia Rolling, MSN, RN,BSN [place] Acute Care Coordinator [phone] (Direct dial)

## 2022-11-14 DIAGNOSIS — I10 Essential (primary) hypertension: Secondary | ICD-10-CM | POA: Diagnosis not present

## 2022-11-14 DIAGNOSIS — R0600 Dyspnea, unspecified: Secondary | ICD-10-CM | POA: Diagnosis not present

## 2022-11-14 DIAGNOSIS — R1310 Dysphagia, unspecified: Secondary | ICD-10-CM | POA: Diagnosis not present

## 2022-11-18 DIAGNOSIS — R1310 Dysphagia, unspecified: Secondary | ICD-10-CM | POA: Diagnosis not present

## 2022-11-18 DIAGNOSIS — I10 Essential (primary) hypertension: Secondary | ICD-10-CM | POA: Diagnosis not present

## 2022-11-18 DIAGNOSIS — R0600 Dyspnea, unspecified: Secondary | ICD-10-CM | POA: Diagnosis not present

## 2022-11-19 ENCOUNTER — Ambulatory Visit (INDEPENDENT_AMBULATORY_CARE_PROVIDER_SITE_OTHER): Payer: Medicare Other

## 2022-11-19 ENCOUNTER — Non-Acute Institutional Stay: Payer: Self-pay | Admitting: Hospice

## 2022-11-19 ENCOUNTER — Ambulatory Visit (INDEPENDENT_AMBULATORY_CARE_PROVIDER_SITE_OTHER): Payer: Medicare Other | Admitting: Orthopedic Surgery

## 2022-11-19 DIAGNOSIS — I10 Essential (primary) hypertension: Secondary | ICD-10-CM | POA: Diagnosis not present

## 2022-11-19 DIAGNOSIS — F0390 Unspecified dementia without behavioral disturbance: Secondary | ICD-10-CM

## 2022-11-19 DIAGNOSIS — Z9181 History of falling: Secondary | ICD-10-CM | POA: Diagnosis not present

## 2022-11-19 DIAGNOSIS — M25552 Pain in left hip: Secondary | ICD-10-CM | POA: Diagnosis not present

## 2022-11-19 DIAGNOSIS — Z515 Encounter for palliative care: Secondary | ICD-10-CM | POA: Diagnosis not present

## 2022-11-19 DIAGNOSIS — R531 Weakness: Secondary | ICD-10-CM

## 2022-11-19 DIAGNOSIS — R000 Tachycardia, unspecified: Secondary | ICD-10-CM | POA: Diagnosis not present

## 2022-11-19 DIAGNOSIS — T8149XS Infection following a procedure, other surgical site, sequela: Secondary | ICD-10-CM | POA: Diagnosis not present

## 2022-11-19 DIAGNOSIS — Z4789 Encounter for other orthopedic aftercare: Secondary | ICD-10-CM | POA: Diagnosis not present

## 2022-11-19 DIAGNOSIS — E785 Hyperlipidemia, unspecified: Secondary | ICD-10-CM | POA: Diagnosis not present

## 2022-11-19 DIAGNOSIS — S72142D Displaced intertrochanteric fracture of left femur, subsequent encounter for closed fracture with routine healing: Secondary | ICD-10-CM | POA: Diagnosis not present

## 2022-11-19 DIAGNOSIS — I639 Cerebral infarction, unspecified: Secondary | ICD-10-CM | POA: Diagnosis not present

## 2022-11-19 NOTE — Progress Notes (Signed)
[facility] Consult Note Telephone: [phone]  Fax: [phone]  PATIENT NAME: Anna Weiss [address] [place] [address] [phone] (home)  DOB: 22-Feb-1925 MRN: [id number]  PRIMARY CARE PROVIDER:    Mayra Neer, MD,  [place] [address] [place] [address] [phone]  REFERRING PROVIDER:   Mayra Neer, MD [address] [place],  [place] [address] [phone]  RESPONSIBLE PARTY:    Contact Information     Name Relation Home Work [place] Daughter [phone]  [phone]   Anna Weiss Anna Weiss   [phone]       I met face to face with patient in the facility. Visit to build trust and highlight Palliative Medicine as specialized medical care for people living with serious illness, aimed at facilitating better quality of life through symptoms relief, assisting with advance care planning and complex medical decision making.  NP called Anna Weiss and updated her on visit.  She endorsed palliative service, provided additional history since patient with cognitive deficits.    ASSESSMENT AND / RECOMMENDATIONS:   Advance Care Planning: Our advance care planning conversation included a discussion about:    The value and importance of advance care planning  Difference between Hospice and Palliative care Exploration of goals of care in the event of a sudden injury or illness  Identification and preparation of a healthcare agent  Review and updating or creation of an  advance directive document . Decision not to resuscitate or to de-escalate disease focused treatments due to poor prognosis.  CODE STATUS: Patient is a DO NOT RESUSCITATE  Goals of Care: Goals include to maximize quality of life and symptom management.  Family is interested in hospice service when patient qualifies for it.  I spent  20 minutes providing this initial consultation. More than 50% of the time  in this consultation was spent on counseling patient and coordinating communication. --------------------------------------------------------------------------------------------------------------------------------------  Symptom Management/Plan: Dementia: Advanced, alert with memory loss/confusion.  Continue ongoing supportive care.  Fall and safety precautions. Weakness: Related to recent hospitalizations, right femoral fracture, and general decline in functional status.  PT OT is ongoing for strengthening.  Provide assistance during meals to ensure adequate oral intake.  Follow up: Palliative care will continue to follow for complex medical decision making, advance care planning, and clarification of goals. Return 6 weeks or prn. Encouraged to call provider sooner with any concerns.   Family /Caregiver/Community Supports: Patient in SNF for ongoing care  HOSPICE ELIGIBILITY/DIAGNOSIS: TBD  Chief Complaint: Initial Palliative care visit  HISTORY OF PRESENT ILLNESS:  Anna Weiss is a 88 y.o. year old female  with multiple morbidities requiring close monitoring and with high risk of complications and  mortality:  Advanced dementia, generalized weakness. History of stroke, hypertension.  History obtained from review of EMR, discussion with primary team, caregiver, family and/or Anna Weiss.  Review and summarization of Epic records shows history from other than patient. Rest of 10 point ROS asked and negative. Independent interpretation of tests and reviewed as needed, available labs, patient records, imaging, studies and related documents from the EMR.   PAST MEDICAL HISTORY:  Active Ambulatory Problems    Diagnosis Date Noted   Pain in right lower leg 05/14/2018   Rib fracture 04/08/2022   Lumbar compression fracture ([place]) 04/08/2022   Displaced intertrochanteric fracture of left femur, initial encounter for closed fracture ([place]) 09/24/2022   Hip fracture ([place]) 09/24/2022   Sinus  bradycardia 09/25/2022   Left lower lobe consolidation ([place]) 09/25/2022   Essential hypertension 09/25/2022   Left parietal scalp hematoma, initial encounter 09/25/2022   Fracture, intertrochanteric, left femur, sequela 09/28/2022   Drainage from wound 10/10/2022   Surgical site infection 10/28/2022   Resolved Ambulatory Problems    Diagnosis Date Noted   No Resolved Ambulatory Problems   Past Medical History:  Diagnosis Date   High cholesterol    Hypertension    Stroke ([place])    Tachycardia     SOCIAL HX:  Social History   Tobacco Use   Smoking status: Never   Smokeless tobacco: Never  Substance Use Topics   Alcohol use: No     FAMILY HX: No family history on file.    ALLERGIES:  Allergies  Allergen Reactions   Asa [Aspirin] Nausea Only      PERTINENT MEDICATIONS:  Outpatient Encounter Medications as of 11/19/2022  Medication Sig   acetaminophen (TYLENOL) 325 MG tablet Take 2 tablets (650 mg total) by mouth every 6 (six) hours as needed for mild pain. (Patient taking differently: Take 650 mg by mouth every 6 (six) hours as needed for mild pain. (0900, 1700))   antiseptic oral rinse (BIOTENE) LIQD 15 mLs by Mouth Rinse route as needed for dry mouth. (Patient not taking: Reported on 10/31/2022)   ARTIFICIAL TEAR OP Place 1 drop into both eyes 2 (two) times daily.   bisacodyl (DULCOLAX) 10 MG suppository Place 1 suppository (10 mg total) rectally daily as needed for moderate constipation.   clopidogrel (PLAVIX) 75 MG tablet Take 1 tablet (75 mg total) by mouth daily. (Patient taking differently: Take 75 mg by mouth in the morning. (0900))   famotidine (PEPCID) 40 MG/5ML suspension Place 5 mLs (40 mg total) into feeding tube 2 (two) times daily. (Patient taking differently: Take 40 mg by mouth 2 (two) times daily. (0900, 2100))   hydrALAZINE (APRESOLINE) 25 MG tablet Take 1 tablet (25 mg total) by mouth every 8 (eight) hours. (Patient taking differently: Take 25 mg by mouth  every 8 (eight) hours. (0600, 1400, 2200))   iron polysaccharides (NIFEREX) 150 MG capsule Take 1 capsule (150 mg total) by mouth daily. (Patient taking differently: Take 150 mg by mouth in the morning. (1000))   levothyroxine (SYNTHROID) 50 MCG tablet Take 1 tablet (50 mcg total) by mouth daily at 6 (six) AM.   Morphine Sulfate (MORPHINE CONCENTRATE) 10 MG/0.5ML SOLN concentrated solution May place 0.13 mLs (2.6 mg total) under the tongue every 4 (four) hours as needed for severe pain or shortness of breath. (Patient taking differently: Place 2.6 mg under the tongue every 4 (four) hours as needed for severe pain or shortness of breath.)   Nutritional Supplements (ENSURE ORIGINAL) LIQD Take 237 mLs by mouth 2 (two) times daily between meals. (1000, 1400)   Nutritional Supplements (NUTRITIONAL DRINK) LIQD Take 120 mLs by mouth 2 (two) times daily. [facility] (0900,1700)   potassium chloride (KLOR-CON) 20 MEQ packet Take 40 mEq by mouth daily. (Patient taking differently: Take 40 mEq by mouth in the morning. (0900))   tamsulosin (FLOMAX) 0.4 MG CAPS capsule Take 2 capsules (0.8 mg total) by mouth daily after supper. (Patient taking differently: Take 0.8 mg by mouth daily after supper. (1800))   No facility-administered encounter medications on file as of 11/19/2022.     Thank you for the opportunity to participate in the care of Anna Weiss.  The palliative care team will continue to follow. Please call  our office at [phone] if we can be of additional assistance.   Note: Portions of this note were generated with [employer]. Dictation errors may occur despite best attempts at proofreading.  Teodoro Spray, NP

## 2022-11-20 ENCOUNTER — Other Ambulatory Visit: Payer: Self-pay | Admitting: *Deleted

## 2022-11-20 DIAGNOSIS — L89150 Pressure ulcer of sacral region, unstageable: Secondary | ICD-10-CM | POA: Diagnosis not present

## 2022-11-20 NOTE — Patient Outreach (Signed)
[place] Coordinator follow up. Mrs. Mathes resides in [facility] [facility].   Previous update received from [facility] social worker indicating transition plan is for LTC.   No identifiable care coordination needs.   Marthenia Rolling, MSN, RN,BSN [place] Acute Care Coordinator [phone] (Direct dial)

## 2022-11-20 NOTE — Progress Notes (Signed)
Orthopedic Surgery Progress Note   Assessment: Patient is a 88 y.o. female with left intertrochanteric femur fracture status post CMN (10/05/2022) that subsequently required I&D (10/09/2022). Approximately 6 weeks from index surgery   Plan: -Operative plans: complete -Antibiotics: continue as directed by infectious disease (is on for pneumonia at this point) -Weight bearing status: as tolerated -Continue to increase ambulation as much as possible, work on left leg strengthening  -Encouraged her to eat as much as she can -Pain control: OTC medications -Return to office in 6 weeks; repeat XR at next visit: AP/lateral hip  ___________________________________________________________________________  Subjective: Patient has had multiple medical setbacks after surgery, including urinary retention, pneumonia, poor oral intake. She feels her hip is doing well. Incisions have healed without issue. She is able to ambulate short distances without any pain in her hip. She just fatigues easily. Has lost a lot of weight due to poor oral intake as she does not have much of an appetite. Denies paresthesias and numbness.    Physical Exam:  General: no acute distress, appears stated age Neurologic: alert, answering questions appropriately, following commands Respiratory: unlabored breathing on nasal canula, symmetric chest rise Psychiatric: appropriate affect, normal cadence to speech  MSK:   -Left lower extremity  Incisions are well healed with no surrounding erythema, tenderness or drainage Fires hip flexors, quadriceps, hamstrings, tibialis anterior, gastrocnemius and soleus, extensor hallucis longus Plantarflexes and dorsiflexes toes Sensation intact to light touch in sural, saphenous, tibial, deep peroneal, and superficial peroneal nerve distributions Foot warm and well perfused   Patient name: Anna Weiss Patient MRN: [id number] Date: 11/20/22

## 2022-11-21 DIAGNOSIS — R1310 Dysphagia, unspecified: Secondary | ICD-10-CM | POA: Diagnosis not present

## 2022-11-21 DIAGNOSIS — R0600 Dyspnea, unspecified: Secondary | ICD-10-CM | POA: Diagnosis not present

## 2022-11-21 DIAGNOSIS — I10 Essential (primary) hypertension: Secondary | ICD-10-CM | POA: Diagnosis not present

## 2022-11-26 DIAGNOSIS — R000 Tachycardia, unspecified: Secondary | ICD-10-CM | POA: Diagnosis not present

## 2022-11-26 DIAGNOSIS — I639 Cerebral infarction, unspecified: Secondary | ICD-10-CM | POA: Diagnosis not present

## 2022-11-26 DIAGNOSIS — T8149XS Infection following a procedure, other surgical site, sequela: Secondary | ICD-10-CM | POA: Diagnosis not present

## 2022-11-26 DIAGNOSIS — S72142D Displaced intertrochanteric fracture of left femur, subsequent encounter for closed fracture with routine healing: Secondary | ICD-10-CM | POA: Diagnosis not present

## 2022-11-26 DIAGNOSIS — I10 Essential (primary) hypertension: Secondary | ICD-10-CM | POA: Diagnosis not present

## 2022-11-26 DIAGNOSIS — Z9181 History of falling: Secondary | ICD-10-CM | POA: Diagnosis not present

## 2022-11-26 DIAGNOSIS — E785 Hyperlipidemia, unspecified: Secondary | ICD-10-CM | POA: Diagnosis not present

## 2022-11-26 DIAGNOSIS — Z4789 Encounter for other orthopedic aftercare: Secondary | ICD-10-CM | POA: Diagnosis not present

## 2022-11-27 DIAGNOSIS — I10 Essential (primary) hypertension: Secondary | ICD-10-CM | POA: Diagnosis not present

## 2022-11-27 DIAGNOSIS — R1310 Dysphagia, unspecified: Secondary | ICD-10-CM | POA: Diagnosis not present

## 2022-11-27 DIAGNOSIS — R0600 Dyspnea, unspecified: Secondary | ICD-10-CM | POA: Diagnosis not present

## 2022-11-28 DIAGNOSIS — R0600 Dyspnea, unspecified: Secondary | ICD-10-CM | POA: Diagnosis not present

## 2022-11-28 DIAGNOSIS — R1310 Dysphagia, unspecified: Secondary | ICD-10-CM | POA: Diagnosis not present

## 2022-11-28 DIAGNOSIS — I10 Essential (primary) hypertension: Secondary | ICD-10-CM | POA: Diagnosis not present

## 2022-12-01 ENCOUNTER — Other Ambulatory Visit: Payer: Self-pay

## 2022-12-01 ENCOUNTER — Ambulatory Visit: Payer: Medicare HMO | Admitting: Internal Medicine

## 2022-12-01 ENCOUNTER — Encounter: Payer: Self-pay | Admitting: Internal Medicine

## 2022-12-01 VITALS — BP 102/65 | HR 68 | Temp 97.3°F | Resp 16

## 2022-12-01 DIAGNOSIS — T8149XA Infection following a procedure, other surgical site, initial encounter: Secondary | ICD-10-CM

## 2022-12-01 DIAGNOSIS — L89150 Pressure ulcer of sacral region, unstageable: Secondary | ICD-10-CM | POA: Diagnosis not present

## 2022-12-01 DIAGNOSIS — L89616 Pressure-induced deep tissue damage of right heel: Secondary | ICD-10-CM | POA: Diagnosis not present

## 2022-12-01 DIAGNOSIS — L89626 Pressure-induced deep tissue damage of left heel: Secondary | ICD-10-CM | POA: Diagnosis not present

## 2022-12-01 NOTE — Progress Notes (Unsigned)
Patient: Anna Weiss  DOB: Sep 16, 1925 MRN: [id number] PCP: Anna Neer, MD  Referring Provider: ***  No chief complaint on file.    Patient Active Problem List   Diagnosis Date Noted   Surgical site infection 10/28/2022   Drainage from wound 10/10/2022   Fracture, intertrochanteric, left femur, sequela 09/28/2022   Sinus bradycardia 09/25/2022   Left lower lobe consolidation ([place]) 09/25/2022   Essential hypertension 09/25/2022   Left parietal scalp hematoma, initial encounter 09/25/2022   Displaced intertrochanteric fracture of left femur, initial encounter for closed fracture ([place]) 09/24/2022   Hip fracture ([place]) 09/24/2022   Rib fracture 04/08/2022   Lumbar compression fracture ([place]) 04/08/2022   Pain in right lower leg 05/14/2018     Subjective:  Anna Weiss is a 89 y.o. '@GENDER'$ @ with   Review of Systems  All other systems reviewed and are negative.   Past Medical History:  Diagnosis Date   High cholesterol    Hypertension    Stroke [facility])    2003   Tachycardia     Outpatient Medications Prior to Visit  Medication Sig Dispense Refill   acetaminophen (TYLENOL) 325 MG tablet Take 2 tablets (650 mg total) by mouth every 6 (six) hours as needed for mild pain. (Patient taking differently: Take 650 mg by mouth every 6 (six) hours as needed for mild pain. (0900, 1700))     antiseptic oral rinse (BIOTENE) LIQD 15 mLs by Mouth Rinse route as needed for dry mouth. (Patient not taking: Reported on 10/31/2022)     ARTIFICIAL TEAR OP Place 1 drop into both eyes 2 (two) times daily.     bisacodyl (DULCOLAX) 10 MG suppository Place 1 suppository (10 mg total) rectally daily as needed for moderate constipation. 12 suppository 0   clopidogrel (PLAVIX) 75 MG tablet Take 1 tablet (75 mg total) by mouth daily. (Patient taking differently: Take 75 mg by mouth in the morning. (0900))     famotidine (PEPCID) 40 MG/5ML suspension Place 5 mLs (40 mg total) into feeding tube 2  (two) times daily. (Patient taking differently: Take 40 mg by mouth 2 (two) times daily. (0900, 2100)) 50 mL 0   hydrALAZINE (APRESOLINE) 25 MG tablet Take 1 tablet (25 mg total) by mouth every 8 (eight) hours. (Patient taking differently: Take 25 mg by mouth every 8 (eight) hours. (0600, 1400, 2200))     iron polysaccharides (NIFEREX) 150 MG capsule Take 1 capsule (150 mg total) by mouth daily. (Patient taking differently: Take 150 mg by mouth in the morning. (1000))     levothyroxine (SYNTHROID) 50 MCG tablet Take 1 tablet (50 mcg total) by mouth daily at 6 (six) AM.     Morphine Sulfate (MORPHINE CONCENTRATE) 10 MG/0.5ML SOLN concentrated solution May place 0.13 mLs (2.6 mg total) under the tongue every 4 (four) hours as needed for severe pain or shortness of breath. (Patient taking differently: Place 2.6 mg under the tongue every 4 (four) hours as needed for severe pain or shortness of breath.) 15 mL 0   Nutritional Supplements (ENSURE ORIGINAL) LIQD Take 237 mLs by mouth 2 (two) times daily between meals. (1000, 1400)     Nutritional Supplements (NUTRITIONAL DRINK) LIQD Take 120 mLs by mouth 2 (two) times daily. Med Pass (0900,1700)     potassium chloride (KLOR-CON) 20 MEQ packet Take 40 mEq by mouth daily. (Patient taking differently: Take 40 mEq by mouth in the morning. (0900))     tamsulosin (FLOMAX) 0.4 MG  CAPS capsule Take 2 capsules (0.8 mg total) by mouth daily after supper. (Patient taking differently: Take 0.8 mg by mouth daily after supper. (1800)) 30 capsule    No facility-administered medications prior to visit.     Allergies  Allergen Reactions   Asa [Aspirin] Nausea Only    Social History   Tobacco Use   Smoking status: Never   Smokeless tobacco: Never  Vaping Use   Vaping Use: Never used  Substance Use Topics   Alcohol use: No   Drug use: No    No family history on file.  Objective:  There were no vitals filed for this visit. There is no height or weight on file  to calculate BMI.  Physical Exam Constitutional:      Appearance: Normal appearance.  HENT:     Head: Normocephalic and atraumatic.     Right Ear: Tympanic membrane normal.     Left Ear: Tympanic membrane normal.     Nose: Nose normal.     Mouth/Throat:     Mouth: Mucous membranes are moist.  Eyes:     Extraocular Movements: Extraocular movements intact.     Conjunctiva/sclera: Conjunctivae normal.     Pupils: Pupils are equal, round, and reactive to light.  Cardiovascular:     Rate and Rhythm: Normal rate and regular rhythm.     Heart sounds: No murmur heard.    No friction rub. No gallop.  Pulmonary:     Effort: Pulmonary effort is normal.     Breath sounds: Normal breath sounds.  Abdominal:     General: Abdomen is flat.     Palpations: Abdomen is soft.  Musculoskeletal:        General: Normal range of motion.  Skin:    General: Skin is warm and dry.  Neurological:     General: No focal deficit present.     Mental Status: She is alert and oriented to person, place, and time.  Psychiatric:        Mood and Affect: Mood normal.     Lab Results: Lab Results  Component Value Date   WBC 7.5 10/31/2022   HGB 9.8 (L) 10/31/2022   HCT 30.4 (L) 10/31/2022   MCV 106.7 (H) 10/31/2022   PLT 154 10/31/2022    Lab Results  Component Value Date   CREATININE 0.68 10/31/2022   BUN 24 (H) 10/31/2022   NA 140 10/31/2022   K 3.8 10/31/2022   CL 105 10/31/2022   CO2 28 10/31/2022    Lab Results  Component Value Date   ALT 29 10/31/2022   AST 25 10/31/2022   ALKPHOS 88 10/31/2022   BILITOT 1.1 10/31/2022     Assessment & Plan:   Problem List Items Addressed This Visit   None  12/29-1/4 cipro for UTI-foley in place x 102moh *** will return to clinic in *** {weeks/months} for follow up Ost viral cough, flu about a bout a week ago  MLaurice Record MCop[facility] Fallsfor Infectious DFort [facility]aGroup   12/01/22  9:00 AM

## 2022-12-02 LAB — SEDIMENTATION RATE: Sed Rate: 34 mm/h — ABNORMAL HIGH (ref 0–30)

## 2022-12-02 LAB — C-REACTIVE PROTEIN: CRP: 46 mg/L — ABNORMAL HIGH (ref <8.0)

## 2022-12-03 DIAGNOSIS — T8149XS Infection following a procedure, other surgical site, sequela: Secondary | ICD-10-CM | POA: Diagnosis not present

## 2022-12-03 DIAGNOSIS — Z9181 History of falling: Secondary | ICD-10-CM | POA: Diagnosis not present

## 2022-12-03 DIAGNOSIS — E785 Hyperlipidemia, unspecified: Secondary | ICD-10-CM | POA: Diagnosis not present

## 2022-12-03 DIAGNOSIS — R000 Tachycardia, unspecified: Secondary | ICD-10-CM | POA: Diagnosis not present

## 2022-12-03 DIAGNOSIS — I10 Essential (primary) hypertension: Secondary | ICD-10-CM | POA: Diagnosis not present

## 2022-12-03 DIAGNOSIS — S72142D Displaced intertrochanteric fracture of left femur, subsequent encounter for closed fracture with routine healing: Secondary | ICD-10-CM | POA: Diagnosis not present

## 2022-12-03 DIAGNOSIS — Z4789 Encounter for other orthopedic aftercare: Secondary | ICD-10-CM | POA: Diagnosis not present

## 2022-12-03 DIAGNOSIS — I639 Cerebral infarction, unspecified: Secondary | ICD-10-CM | POA: Diagnosis not present

## 2022-12-08 DIAGNOSIS — L89616 Pressure-induced deep tissue damage of right heel: Secondary | ICD-10-CM | POA: Diagnosis not present

## 2022-12-08 DIAGNOSIS — J09X2 Influenza due to identified novel influenza A virus with other respiratory manifestations: Secondary | ICD-10-CM | POA: Diagnosis not present

## 2022-12-08 DIAGNOSIS — L89150 Pressure ulcer of sacral region, unstageable: Secondary | ICD-10-CM | POA: Diagnosis not present

## 2022-12-08 DIAGNOSIS — L89626 Pressure-induced deep tissue damage of left heel: Secondary | ICD-10-CM | POA: Diagnosis not present

## 2022-12-11 DIAGNOSIS — T819XXA Unspecified complication of procedure, initial encounter: Secondary | ICD-10-CM | POA: Diagnosis not present

## 2022-12-12 ENCOUNTER — Encounter (HOSPITAL_COMMUNITY): Payer: Self-pay | Admitting: Orthopedic Surgery

## 2022-12-12 ENCOUNTER — Other Ambulatory Visit: Payer: Self-pay

## 2022-12-12 ENCOUNTER — Inpatient Hospital Stay (HOSPITAL_COMMUNITY): Payer: Medicare HMO | Admitting: Anesthesiology

## 2022-12-12 ENCOUNTER — Encounter (HOSPITAL_COMMUNITY)
Admission: RE | Disposition: A | Discharge status: Hospice | Payer: Self-pay | Source: Home / Self Care | Attending: Orthopedic Surgery

## 2022-12-12 ENCOUNTER — Ambulatory Visit (INDEPENDENT_AMBULATORY_CARE_PROVIDER_SITE_OTHER): Payer: Medicare HMO | Admitting: Orthopedic Surgery

## 2022-12-12 ENCOUNTER — Inpatient Hospital Stay (HOSPITAL_COMMUNITY)
Admission: RE | Admit: 2022-12-12 | Discharge: 2022-12-21 | DRG: 857 | Disposition: A | Discharge status: Hospice | Payer: Medicare HMO | Attending: Orthopedic Surgery | Admitting: Orthopedic Surgery

## 2022-12-12 DIAGNOSIS — D62 Acute posthemorrhagic anemia: Secondary | ICD-10-CM | POA: Diagnosis not present

## 2022-12-12 DIAGNOSIS — T8142XA Infection following a procedure, deep incisional surgical site, initial encounter: Secondary | ICD-10-CM

## 2022-12-12 DIAGNOSIS — Z79899 Other long term (current) drug therapy: Secondary | ICD-10-CM

## 2022-12-12 DIAGNOSIS — E039 Hypothyroidism, unspecified: Secondary | ICD-10-CM

## 2022-12-12 DIAGNOSIS — T84621A Infection and inflammatory reaction due to internal fixation device of left femur, initial encounter: Secondary | ICD-10-CM | POA: Diagnosis not present

## 2022-12-12 DIAGNOSIS — F419 Anxiety disorder, unspecified: Secondary | ICD-10-CM | POA: Diagnosis present

## 2022-12-12 DIAGNOSIS — I110 Hypertensive heart disease with heart failure: Secondary | ICD-10-CM

## 2022-12-12 DIAGNOSIS — J90 Pleural effusion, not elsewhere classified: Secondary | ICD-10-CM | POA: Diagnosis not present

## 2022-12-12 DIAGNOSIS — R0902 Hypoxemia: Secondary | ICD-10-CM | POA: Diagnosis not present

## 2022-12-12 DIAGNOSIS — R338 Other retention of urine: Secondary | ICD-10-CM | POA: Insufficient documentation

## 2022-12-12 DIAGNOSIS — I10 Essential (primary) hypertension: Secondary | ICD-10-CM | POA: Diagnosis not present

## 2022-12-12 DIAGNOSIS — E7800 Pure hypercholesterolemia, unspecified: Secondary | ICD-10-CM | POA: Diagnosis present

## 2022-12-12 DIAGNOSIS — Z515 Encounter for palliative care: Secondary | ICD-10-CM

## 2022-12-12 DIAGNOSIS — I509 Heart failure, unspecified: Secondary | ICD-10-CM

## 2022-12-12 DIAGNOSIS — T8452XA Infection and inflammatory reaction due to internal left hip prosthesis, initial encounter: Secondary | ICD-10-CM | POA: Diagnosis not present

## 2022-12-12 DIAGNOSIS — L89616 Pressure-induced deep tissue damage of right heel: Secondary | ICD-10-CM | POA: Diagnosis present

## 2022-12-12 DIAGNOSIS — I96 Gangrene, not elsewhere classified: Secondary | ICD-10-CM | POA: Diagnosis present

## 2022-12-12 DIAGNOSIS — Z90710 Acquired absence of both cervix and uterus: Secondary | ICD-10-CM | POA: Diagnosis not present

## 2022-12-12 DIAGNOSIS — T8140XD Infection following a procedure, unspecified, subsequent encounter: Secondary | ICD-10-CM | POA: Diagnosis not present

## 2022-12-12 DIAGNOSIS — I959 Hypotension, unspecified: Secondary | ICD-10-CM | POA: Insufficient documentation

## 2022-12-12 DIAGNOSIS — L89210 Pressure ulcer of right hip, unstageable: Secondary | ICD-10-CM | POA: Diagnosis present

## 2022-12-12 DIAGNOSIS — D638 Anemia in other chronic diseases classified elsewhere: Secondary | ICD-10-CM

## 2022-12-12 DIAGNOSIS — R001 Bradycardia, unspecified: Secondary | ICD-10-CM | POA: Diagnosis present

## 2022-12-12 DIAGNOSIS — L89620 Pressure ulcer of left heel, unstageable: Secondary | ICD-10-CM | POA: Diagnosis present

## 2022-12-12 DIAGNOSIS — Z79890 Hormone replacement therapy (postmenopausal): Secondary | ICD-10-CM | POA: Diagnosis not present

## 2022-12-12 DIAGNOSIS — Z66 Do not resuscitate: Secondary | ICD-10-CM | POA: Diagnosis present

## 2022-12-12 DIAGNOSIS — Z886 Allergy status to analgesic agent status: Secondary | ICD-10-CM | POA: Diagnosis not present

## 2022-12-12 DIAGNOSIS — Y848 Other medical procedures as the cause of abnormal reaction of the patient, or of later complication, without mention of misadventure at the time of the procedure: Secondary | ICD-10-CM | POA: Diagnosis present

## 2022-12-12 DIAGNOSIS — A4902 Methicillin resistant Staphylococcus aureus infection, unspecified site: Secondary | ICD-10-CM | POA: Diagnosis not present

## 2022-12-12 DIAGNOSIS — Z7902 Long term (current) use of antithrombotics/antiplatelets: Secondary | ICD-10-CM | POA: Diagnosis not present

## 2022-12-12 DIAGNOSIS — R911 Solitary pulmonary nodule: Secondary | ICD-10-CM | POA: Insufficient documentation

## 2022-12-12 DIAGNOSIS — T8140XA Infection following a procedure, unspecified, initial encounter: Principal | ICD-10-CM | POA: Diagnosis present

## 2022-12-12 DIAGNOSIS — J9611 Chronic respiratory failure with hypoxia: Secondary | ICD-10-CM | POA: Diagnosis not present

## 2022-12-12 DIAGNOSIS — Z8673 Personal history of transient ischemic attack (TIA), and cerebral infarction without residual deficits: Secondary | ICD-10-CM

## 2022-12-12 DIAGNOSIS — F0390 Unspecified dementia without behavioral disturbance: Secondary | ICD-10-CM | POA: Diagnosis present

## 2022-12-12 DIAGNOSIS — Z7189 Other specified counseling: Secondary | ICD-10-CM | POA: Diagnosis not present

## 2022-12-12 DIAGNOSIS — R918 Other nonspecific abnormal finding of lung field: Secondary | ICD-10-CM | POA: Diagnosis not present

## 2022-12-12 DIAGNOSIS — D539 Nutritional anemia, unspecified: Secondary | ICD-10-CM | POA: Diagnosis not present

## 2022-12-12 DIAGNOSIS — T8142XD Infection following a procedure, deep incisional surgical site, subsequent encounter: Secondary | ICD-10-CM | POA: Diagnosis not present

## 2022-12-12 DIAGNOSIS — M25552 Pain in left hip: Secondary | ICD-10-CM | POA: Diagnosis present

## 2022-12-12 HISTORY — PX: INCISION AND DRAINAGE HIP: SHX1801

## 2022-12-12 LAB — BASIC METABOLIC PANEL
Anion gap: 9 (ref 5–15)
BUN: 13 mg/dL (ref 8–23)
CO2: 26 mmol/L (ref 22–32)
Calcium: 8.1 mg/dL — ABNORMAL LOW (ref 8.9–10.3)
Chloride: 97 mmol/L — ABNORMAL LOW (ref 98–111)
Creatinine, Ser: 0.49 mg/dL (ref 0.44–1.00)
GFR, Estimated: >60 mL/min (ref >60)
Glucose, Bld: 113 mg/dL — ABNORMAL HIGH (ref 70–99)
Potassium: 5 mmol/L (ref 3.5–5.1)
Sodium: 132 mmol/L — ABNORMAL LOW (ref 135–145)

## 2022-12-12 LAB — SURGICAL PCR SCREEN
MRSA, PCR: POSITIVE — AB
Staphylococcus aureus: POSITIVE — AB

## 2022-12-12 LAB — CBC
HCT: 26.7 % — ABNORMAL LOW (ref 36.0–46.0)
Hemoglobin: 8.1 g/dL — ABNORMAL LOW (ref 12.0–15.0)
MCH: 32.3 pg (ref 26.0–34.0)
MCHC: 30.3 g/dL (ref 30.0–36.0)
MCV: 106.4 fL — ABNORMAL HIGH (ref 80.0–100.0)
Platelets: 320 10*3/uL (ref 150–400)
RBC: 2.51 MIL/uL — ABNORMAL LOW (ref 3.87–5.11)
RDW: 21 % — ABNORMAL HIGH (ref 11.5–15.5)
WBC: 8.3 10*3/uL (ref 4.0–10.5)
nRBC: 0 % (ref 0.0–0.2)

## 2022-12-12 SURGERY — IRRIGATION AND DEBRIDEMENT HIP
Anesthesia: General | Site: Hip | Laterality: Left

## 2022-12-12 MED ORDER — HYDROMORPHONE HCL 1 MG/ML IJ SOLN
INTRAMUSCULAR | Status: AC
  Filled 2022-12-12: qty 1

## 2022-12-12 MED ORDER — PROPOFOL 10 MG/ML IV BOLUS
INTRAVENOUS | Status: AC
  Filled 2022-12-12: qty 20

## 2022-12-12 MED ORDER — ENSURE ENLIVE PO LIQD
237.0000 mL | Freq: Two times a day (BID) | ORAL | Status: DC
  Administered 2022-12-14 – 2022-12-15 (×3): 237 mL via ORAL

## 2022-12-12 MED ORDER — TAMSULOSIN HCL 0.4 MG PO CAPS
0.8000 mg | ORAL_CAPSULE | Freq: Every day | ORAL | Status: DC
  Administered 2022-12-13 – 2022-12-16 (×4): 0.8 mg via ORAL
  Filled 2022-12-12 (×4): qty 2

## 2022-12-12 MED ORDER — CLOPIDOGREL BISULFATE 75 MG PO TABS
75.0000 mg | ORAL_TABLET | Freq: Every day | ORAL | Status: DC
  Administered 2022-12-14 – 2022-12-17 (×4): 75 mg via ORAL
  Filled 2022-12-12 (×5): qty 1

## 2022-12-12 MED ORDER — PROMETHAZINE HCL 25 MG/ML IJ SOLN: 6.2500 mg | INTRAMUSCULAR | Status: DC | PRN

## 2022-12-12 MED ORDER — POLYETHYLENE GLYCOL 3350 17 G PO PACK
17.0000 g | PACK | Freq: Every day | ORAL | Status: DC
  Administered 2022-12-14 – 2022-12-16 (×3): 17 g via ORAL
  Filled 2022-12-12 (×6): qty 1

## 2022-12-12 MED ORDER — SODIUM CHLORIDE 0.9 % IR SOLN
Status: DC | PRN
  Administered 2022-12-12: 3000 mL

## 2022-12-12 MED ORDER — MORPHINE SULFATE (PF) 2 MG/ML IV SOLN
2.0000 mg | INTRAVENOUS | Status: DC | PRN
  Administered 2022-12-16 – 2022-12-17 (×3): 2 mg via INTRAVENOUS
  Filled 2022-12-12 (×3): qty 1

## 2022-12-12 MED ORDER — AMISULPRIDE (ANTIEMETIC) 5 MG/2ML IV SOLN: 10.0000 mg | Freq: Once | INTRAVENOUS | Status: DC | PRN

## 2022-12-12 MED ORDER — HYDRALAZINE HCL 25 MG PO TABS: 25.0000 mg | ORAL_TABLET | Freq: Three times a day (TID) | ORAL | Status: DC

## 2022-12-12 MED ORDER — ROCURONIUM BROMIDE 10 MG/ML (PF) SYRINGE
PREFILLED_SYRINGE | INTRAVENOUS | Status: DC | PRN
  Administered 2022-12-12: 40 mg via INTRAVENOUS

## 2022-12-12 MED ORDER — OXYCODONE HCL 5 MG PO TABS
5.0000 mg | ORAL_TABLET | Freq: Once | ORAL | Status: AC | PRN
  Administered 2022-12-12: 5 mg via ORAL

## 2022-12-12 MED ORDER — SENNA 8.6 MG PO TABS
1.0000 | ORAL_TABLET | Freq: Two times a day (BID) | ORAL | Status: DC
  Administered 2022-12-13 – 2022-12-17 (×7): 8.6 mg via ORAL
  Filled 2022-12-12 (×9): qty 1

## 2022-12-12 MED ORDER — FAMOTIDINE 40 MG/5ML PO SUSR
40.0000 mg | Freq: Two times a day (BID) | ORAL | Status: DC
  Filled 2022-12-12 (×2): qty 5

## 2022-12-12 MED ORDER — OXYCODONE HCL 5 MG/5ML PO SOLN: 5.0000 mg | Freq: Once | ORAL | Status: AC | PRN

## 2022-12-12 MED ORDER — CHLORHEXIDINE GLUCONATE CLOTH 2 % EX PADS
6.0000 | MEDICATED_PAD | Freq: Every day | CUTANEOUS | Status: AC
  Administered 2022-12-13 – 2022-12-17 (×4): 6 via TOPICAL

## 2022-12-12 MED ORDER — ONDANSETRON HCL 4 MG/2ML IJ SOLN
INTRAMUSCULAR | Status: DC | PRN
  Administered 2022-12-12: 4 mg via INTRAVENOUS

## 2022-12-12 MED ORDER — LIDOCAINE 2% (20 MG/ML) 5 ML SYRINGE
INTRAMUSCULAR | Status: DC | PRN
  Administered 2022-12-12: 20 mg via INTRAVENOUS

## 2022-12-12 MED ORDER — SUGAMMADEX SODIUM 200 MG/2ML IV SOLN
INTRAVENOUS | Status: DC | PRN
  Administered 2022-12-12: 200 mg via INTRAVENOUS

## 2022-12-12 MED ORDER — MUPIROCIN 2 % EX OINT
1.0000 | TOPICAL_OINTMENT | Freq: Two times a day (BID) | CUTANEOUS | Status: AC
  Administered 2022-12-13 – 2022-12-17 (×10): 1 via NASAL
  Filled 2022-12-12 (×2): qty 22

## 2022-12-12 MED ORDER — HYDROMORPHONE HCL 1 MG/ML IJ SOLN
0.2500 mg | INTRAMUSCULAR | Status: DC | PRN
  Administered 2022-12-12 (×2): 0.25 mg via INTRAVENOUS

## 2022-12-12 MED ORDER — LEVOTHYROXINE SODIUM 50 MCG PO TABS
50.0000 ug | ORAL_TABLET | Freq: Every day | ORAL | Status: DC
  Administered 2022-12-14 – 2022-12-17 (×4): 50 ug via ORAL
  Filled 2022-12-12 (×5): qty 1

## 2022-12-12 MED ORDER — PHENYLEPHRINE HCL (PRESSORS) 10 MG/ML IV SOLN
INTRAVENOUS | Status: AC
  Filled 2022-12-12: qty 1

## 2022-12-12 MED ORDER — SODIUM CHLORIDE 0.9 % IV SOLN
600.0000 mg | Freq: Once | INTRAVENOUS | Status: AC
  Administered 2022-12-13: 600 mg via INTRAVENOUS
  Filled 2022-12-12: qty 12

## 2022-12-12 MED ORDER — PROPOFOL 10 MG/ML IV BOLUS
INTRAVENOUS | Status: DC | PRN
  Administered 2022-12-12: 70 mg via INTRAVENOUS

## 2022-12-12 MED ORDER — ACETAMINOPHEN 325 MG PO TABS
650.0000 mg | ORAL_TABLET | Freq: Three times a day (TID) | ORAL | Status: AC
  Administered 2022-12-13: 650 mg via ORAL
  Filled 2022-12-12 (×2): qty 2

## 2022-12-12 MED ORDER — FENTANYL CITRATE (PF) 250 MCG/5ML IJ SOLN
INTRAMUSCULAR | Status: AC
  Filled 2022-12-12: qty 5

## 2022-12-12 MED ORDER — CHLORHEXIDINE GLUCONATE 0.12 % MT SOLN
15.0000 mL | Freq: Once | OROMUCOSAL | Status: AC
  Administered 2022-12-12: 15 mL via OROMUCOSAL
  Filled 2022-12-12: qty 15

## 2022-12-12 MED ORDER — LACTATED RINGERS IV SOLN: INTRAVENOUS | Status: DC

## 2022-12-12 MED ORDER — CEFAZOLIN SODIUM-DEXTROSE 1-4 GM/50ML-% IV SOLN
INTRAVENOUS | Status: DC | PRN
  Administered 2022-12-12: 2 g via INTRAVENOUS

## 2022-12-12 MED ORDER — OXYCODONE HCL 5 MG PO TABS
ORAL_TABLET | ORAL | Status: AC
  Filled 2022-12-12: qty 1

## 2022-12-12 MED ORDER — POLYSACCHARIDE IRON COMPLEX 150 MG PO CAPS
150.0000 mg | ORAL_CAPSULE | Freq: Every day | ORAL | Status: DC
  Administered 2022-12-14 – 2022-12-17 (×4): 150 mg via ORAL
  Filled 2022-12-12 (×6): qty 1

## 2022-12-12 MED ORDER — PHENYLEPHRINE HCL-NACL 20-0.9 MG/250ML-% IV SOLN
INTRAVENOUS | Status: DC | PRN
  Administered 2022-12-12: 75 ug/min via INTRAVENOUS

## 2022-12-12 MED ORDER — NUTRITIONAL DRINK PO LIQD: 120.0000 mL | Freq: Two times a day (BID) | ORAL | Status: DC

## 2022-12-12 MED ORDER — PHENYLEPHRINE 80 MCG/ML (10ML) SYRINGE FOR IV PUSH (FOR BLOOD PRESSURE SUPPORT)
PREFILLED_SYRINGE | INTRAVENOUS | Status: DC | PRN
  Administered 2022-12-12: 80 ug via INTRAVENOUS
  Administered 2022-12-12 (×2): 160 ug via INTRAVENOUS

## 2022-12-12 MED ORDER — FENTANYL CITRATE (PF) 250 MCG/5ML IJ SOLN
INTRAMUSCULAR | Status: DC | PRN
  Administered 2022-12-12: 50 ug via INTRAVENOUS

## 2022-12-12 MED ORDER — EPHEDRINE SULFATE-NACL 50-0.9 MG/10ML-% IV SOSY
PREFILLED_SYRINGE | INTRAVENOUS | Status: DC | PRN
  Administered 2022-12-12 (×3): 5 mg via INTRAVENOUS

## 2022-12-12 MED ORDER — POTASSIUM CHLORIDE 20 MEQ PO PACK
40.0000 meq | PACK | Freq: Every day | ORAL | Status: DC
  Administered 2022-12-14 – 2022-12-17 (×4): 40 meq via ORAL
  Filled 2022-12-12 (×5): qty 2

## 2022-12-12 MED ORDER — ORAL CARE MOUTH RINSE: 15.0000 mL | Freq: Once | OROMUCOSAL | Status: AC

## 2022-12-12 MED ORDER — 0.9 % SODIUM CHLORIDE (POUR BTL) OPTIME
TOPICAL | Status: DC | PRN
  Administered 2022-12-12: 1000 mL

## 2022-12-12 SURGICAL SUPPLY — 48 items
BAG COUNTER SPONGE SURGICOUNT (BAG) ×1 IMPLANT
BAG SPNG CNTER NS LX DISP (BAG) ×1
COVER SURGICAL LIGHT HANDLE (MISCELLANEOUS) ×1 IMPLANT
DRAPE IMP U-DRAPE 54X76 (DRAPES) ×1 IMPLANT
DRAPE INCISE IOBAN 85X60 (DRAPES) IMPLANT
DRSG AQUACEL AG ADV 3.5X10 (GAUZE/BANDAGES/DRESSINGS) IMPLANT
DRSG MEPILEX BORDER 4X8 (GAUZE/BANDAGES/DRESSINGS) IMPLANT
DRSG MEPILEX POST OP 4X8 (GAUZE/BANDAGES/DRESSINGS) IMPLANT
DURAPREP 26ML APPLICATOR (WOUND CARE) ×1 IMPLANT
ELECT CAUTERY BLADE 6.4 (BLADE) ×1 IMPLANT
ELECT REM PT RETURN 9FT ADLT (ELECTROSURGICAL) ×1
ELECTRODE REM PT RTRN 9FT ADLT (ELECTROSURGICAL) ×1 IMPLANT
EVACUATOR 1/8 PVC DRAIN (DRAIN) IMPLANT
FACESHIELD WRAPAROUND (MASK) IMPLANT
FACESHIELD WRAPAROUND OR TEAM (MASK) IMPLANT
GLOVE BIOGEL PI IND STRL 7.0 (GLOVE) ×2 IMPLANT
GLOVE BIOGEL PI IND STRL 7.5 (GLOVE) ×1 IMPLANT
GLOVE ECLIPSE 7.0 STRL STRAW (GLOVE) ×1 IMPLANT
GLOVE SKINSENSE STRL SZ7.5 (GLOVE) ×2 IMPLANT
GLOVE SURG SYN 7.5  E (GLOVE)
GLOVE SURG SYN 7.5 E (GLOVE) IMPLANT
GLOVE SURG SYN 7.5 PF PI (GLOVE) ×2 IMPLANT
GLOVE SURG UNDER POLY LF SZ7 (GLOVE) ×19 IMPLANT
GLOVE SURG UNDER POLY LF SZ7.5 (GLOVE) ×4 IMPLANT
GOWN STRL REIN XL XLG (GOWN DISPOSABLE) ×1 IMPLANT
HANDPIECE INTERPULSE COAX TIP (DISPOSABLE) ×1
KIT BASIN OR (CUSTOM PROCEDURE TRAY) ×1 IMPLANT
KIT TURNOVER KIT B (KITS) ×1 IMPLANT
MANIFOLD NEPTUNE II (INSTRUMENTS) ×1 IMPLANT
NS IRRIG 1000ML POUR BTL (IV SOLUTION) ×1 IMPLANT
PACK SHOULDER (CUSTOM PROCEDURE TRAY) ×1 IMPLANT
PACK UNIVERSAL I (CUSTOM PROCEDURE TRAY) ×1 IMPLANT
PAD ARMBOARD 7.5X6 YLW CONV (MISCELLANEOUS) ×2 IMPLANT
SET CYSTO W/LG BORE CLAMP LF (SET/KITS/TRAYS/PACK) IMPLANT
SET HNDPC FAN SPRY TIP SCT (DISPOSABLE) ×1 IMPLANT
STAPLER VISISTAT 35W (STAPLE) IMPLANT
SUT ETHILON 3 0 FSL (SUTURE) IMPLANT
SUT PDS AB 0 CT1 27 (SUTURE) IMPLANT
SUT PDS AB 1 CT  36 (SUTURE)
SUT PDS AB 1 CT 36 (SUTURE) ×2 IMPLANT
SUT PDS AB 2-0 CT1 27 (SUTURE) IMPLANT
SUT VIC AB 0 CT1 27 (SUTURE)
SUT VIC AB 0 CT1 27XBRD ANBCTR (SUTURE) IMPLANT
SUT VIC AB 1 CTB1 27 (SUTURE) IMPLANT
SUT VIC AB 2-0 CT1 27 (SUTURE)
SUT VIC AB 2-0 CT1 TAPERPNT 27 (SUTURE) IMPLANT
TOWEL GREEN STERILE (TOWEL DISPOSABLE) ×1 IMPLANT
TOWEL GREEN STERILE FF (TOWEL DISPOSABLE) ×1 IMPLANT

## 2022-12-12 NOTE — Anesthesia Procedure Notes (Signed)
Procedure Name: Intubation Date/Time: 12/12/2022 6:38 PM  Performed by: Georgia Duff, CRNAPre-anesthesia Checklist: Patient identified, Emergency Drugs available, Suction available and Patient being monitored Patient Re-evaluated:Patient Re-evaluated prior to induction Oxygen Delivery Method: Circle System Utilized Preoxygenation: Pre-oxygenation with 100% oxygen Induction Type: IV induction Ventilation: Mask ventilation without difficulty Laryngoscope Size: Miller and 3 Grade View: Grade I Tube type: Oral Tube size: 7.0 mm Number of attempts: 1 Airway Equipment and Method: Stylet and Oral airway Placement Confirmation: ETT inserted through vocal cords under direct vision, positive ETCO2 and breath sounds checked- equal and bilateral Secured at: 20 cm Tube secured with: Tape Dental Injury: Teeth and Oropharynx as per pre-operative assessment

## 2022-12-12 NOTE — Progress Notes (Signed)
This RN assisting with admission. Vitals obtained and unable to get oral or axillary temp to read after multiple attempts. Pt sleepy but responds to voice. Says feels "a little" cold. Denies pain. Primary RN Lenny Pastel notified and rectal temp obtained of 93.9   Temperature increased in room and warm blankets applied. Rechecked temp at 2345 = 95.9 axillary. Pt reports feeling warmer.  Discussed with rapid RN who recommends continued warm blankets and monitor temp. Primary RN Lenny Pastel notified    12/12/22 2230  Assess: MEWS Score  BP (!) 99/51  MAP (mmHg) 66  Pulse Rate (!) 58  Resp 14  Level of Consciousness Responds to Voice  SpO2 99 %  O2 Device Nasal Cannula  O2 Flow Rate (L/min) 2 L/min  Assess: MEWS Score  MEWS Temp 0  MEWS Systolic 1  MEWS Pulse 0  MEWS RR 0  MEWS LOC 1  MEWS Score 2  MEWS Score Color Yellow  Assess: if the MEWS score is Yellow or Red  Were vital signs taken at a resting state? Yes  Does the patient meet 2 or more of the SIRS criteria? No  MEWS guidelines implemented *See Row Information* Yes  Treat  MEWS Interventions Escalated (See documentation below)  Pain Scale 0-10  Pain Score 0  Take Vital Signs  Increase Vital Sign Frequency  Yellow: Q 2hr X 2 then Q 4hr X 2, if remains yellow, continue Q 4hrs  Escalate  MEWS: Escalate Yellow: discuss with charge nurse/RN and consider discussing with provider and RRT (primary RN Lenny Pastel notified and at bedside. Lenny Pastel to page MD)  Notify: Charge Nurse/RN  Name of Charge Nurse/RN Notified Eric RN  Date Charge Nurse/RN Notified 12/12/22  Time Charge Nurse/RN Notified 2308  Notify: Rapid Response  Name of Rapid Response RN Notified Mindy RN  Date Rapid Response Notified 12/12/22  Time Rapid Response Notified [id number]  Assess: SIRS CRITERIA  SIRS Temperature  0  SIRS Pulse 0  SIRS Respirations  0  SIRS WBC 0  SIRS Score Sum  0

## 2022-12-12 NOTE — Progress Notes (Signed)
Called Attendings answering service to report temperature rectally of 93.9. awaiting call back.

## 2022-12-12 NOTE — Transfer of Care (Signed)
Immediate Anesthesia Transfer of Care Note  Patient: Anna Weiss  Procedure(s) Performed: LEFT HIP WOUND IRRIGATION AND DEBRIDEMENT (Left: Hip)  Patient Location: PACU  Anesthesia Type:General  Level of Consciousness: drowsy and patient cooperative  Airway & Oxygen Therapy: Patient Spontanous Breathing and Patient connected to nasal cannula oxygen  Post-op Assessment: Report given to RN and Post -op Vital signs reviewed and stable  Post vital signs: Reviewed and stable  Last Vitals:  Vitals Value Taken Time  BP 106/40 12/12/22 2015  Temp    Pulse 57 12/12/22 2017  Resp 22 12/12/22 2017  SpO2 92 % 12/12/22 2017  Vitals shown include unvalidated device data.  Last Pain:  Vitals:   12/12/22 1215  TempSrc:   PainSc: 5       Patients Stated Pain Goal: 2 (40/98/11 9147)  Complications: No notable events documented.

## 2022-12-12 NOTE — Anesthesia Preprocedure Evaluation (Addendum)
Anesthesia Evaluation  Patient identified by MRN, date of birth, ID band Patient awake    Reviewed: Allergy & Precautions, NPO status , Patient's Chart, lab work & pertinent test results  History of Anesthesia Complications Negative for: history of anesthetic complications  Airway Mallampati: III  TM Distance: >3 FB Neck ROM: Full    Dental  (+) Dental Advisory Given, Partial Lower, Partial Upper   Pulmonary neg pulmonary ROS   breath sounds clear to auscultation       Cardiovascular hypertension, Pt. on medications (-) angina +CHF   Rhythm:Regular Rate:Normal  09/25/2022 ECHO:  EF 60-65% normal LVF, mild LVH, mildly reduced RVF, mild MR   Neuro/Psych CVA, No Residual Symptoms  negative psych ROS   GI/Hepatic Neg liver ROS,GERD  Medicated and Controlled,,  Endo/Other  Hypothyroidism    Renal/GU negative Renal ROS     Musculoskeletal   Abdominal   Peds  Hematology  (+) anemia Xarelto (last dose yesterday), plavix   Anesthesia Other Findings   Reproductive/Obstetrics                             Anesthesia Physical Anesthesia Plan  ASA: 3  Anesthesia Plan: General   Post-op Pain Management:    Induction: Intravenous  PONV Risk Score and Plan: 3 and Ondansetron, Dexamethasone, Treatment may vary due to age or medical condition and Midazolam  Airway Management Planned: Oral ETT  Additional Equipment: None  Intra-op Plan:   Post-operative Plan: Extubation in OR  Informed Consent: I have reviewed the patients History and Physical, chart, labs and discussed the procedure including the risks, benefits and alternatives for the proposed anesthesia with the patient or authorized representative who has indicated his/her understanding and acceptance.   Patient has DNR.  Discussed DNR with patient, Discussed DNR with power of attorney and Suspend DNR.   Dental advisory given and Consent  reviewed with POA  Plan Discussed with: CRNA and Surgeon  Anesthesia Plan Comments:        Anesthesia Quick Evaluation

## 2022-12-12 NOTE — Anesthesia Postprocedure Evaluation (Signed)
Anesthesia Post Note  Patient: Anna Weiss  Procedure(s) Performed: LEFT HIP WOUND IRRIGATION AND DEBRIDEMENT (Left: Hip)     Patient location during evaluation: PACU Anesthesia Type: General Level of consciousness: awake and alert Pain management: pain level controlled Vital Signs Assessment: post-procedure vital signs reviewed and stable Respiratory status: spontaneous breathing, nonlabored ventilation and respiratory function stable Cardiovascular status: stable, blood pressure returned to baseline and bradycardic Anesthetic complications: no   No notable events documented.  Last Vitals:  Vitals:   12/12/22 2100 12/12/22 2115  BP: (!) 111/44 (!) 108/42  Pulse: (!) 59 (!) 54  Resp: 12 12  Temp:    SpO2: 94% 94%    Last Pain:  Vitals:   12/12/22 2100  TempSrc:   PainSc: 0-No pain                 Audry Pili

## 2022-12-12 NOTE — Progress Notes (Addendum)
Orthopedic Surgery Progress Note   Assessment: Patient is a 89 y.o. female that underwent cephalomedullary rodding for a intertrochanteric femur fracture. Subsequently became infected and presented to the office with drainage after completing antibiotics. Now s/p repeat I&D   Plan: -Operative plans: complete -Diet: regular -Follow up intra-operative cultures -DVT ppx: resume home plavix -Antibiotics: daptomycin -Weight bearing status: as tolerated -PT evaluate and treat -Pain control -Dispo: to floor from PACU  HTN -resume home hydralazine  Urinary retention -resume flomax that was previously prescribed  Hypothyroidism -resume home levothyroxine  Lower lobe consolidation -continue supplemental O2 as needed to maintain sat's >92%  ___________________________________________________________________________  Subjective: No acute events since surgery. Recovering in pacu. Denies paresthesias and numbness. Pain adequately controlled.    Physical Exam:  General: no acute distress, appears stated age Neurologic: alert, answering questions appropriately, following commands Respiratory: unlabored breathing on supplemental O2, symmetric chest rise  MSK:   -Left lower extremity  Dressing over hip c/d/I, distal incisions appear well healed Fires hip flexors, quadriceps, hamstrings, tibialis anterior, gastrocnemius and soleus, extensor hallucis longus Plantarflexes and dorsiflexes toes Sensation intact to light touch in sural, saphenous, tibial, deep peroneal, and superficial peroneal nerve distributions Foot warm and well perfused   Patient name: Anna Weiss Patient MRN: [id number] Date: 12/12/22

## 2022-12-12 NOTE — Brief Op Note (Signed)
12/12/2022  8:05 PM  PATIENT:  Anna Weiss  89 y.o. female  PRE-OPERATIVE DIAGNOSIS:  LEFT HIP INFECTION  POST-OPERATIVE DIAGNOSIS:  LEFT HIP INFECTION  PROCEDURE:  Procedure(s): LEFT HIP WOUND IRRIGATION AND DEBRIDEMENT (Left)  SURGEON:  Surgeon(s) and Role:    * Callie Fielding, MD - Primary  PHYSICIAN ASSISTANT:   ASSISTANTS: none   ANESTHESIA:   general  EBL:  30cc   BLOOD ADMINISTERED:none  DRAINS: none   LOCAL MEDICATIONS USED:  NONE  SPECIMEN:  Source of Specimen:  left hip x4  DISPOSITION OF SPECIMEN:  PATHOLOGY  COUNTS:  YES  TOURNIQUET: none  DICTATION: .Note written in EPIC  PLAN OF CARE: Admit to inpatient   PATIENT DISPOSITION:  PACU - hemodynamically stable.   Delay start of Pharmacological VTE agent (>24hrs) due to surgical blood loss or risk of bleeding: no

## 2022-12-12 NOTE — Progress Notes (Signed)
Pharmacy Antibiotic Note  Anna Weiss is a 89 y.o. female admitted on 12/12/2022 with  infected hardware, now s/p I&D .  Pharmacy has been consulted for daptomycin dosing.  Of note pt had deep tissue infection in Nov, initially on vanc for MRSA, changed by ID to dapto for OPAT, completed 12/15.  Plan: Daptomycin '600mg'$  IV x1; f/u with ID recs.  Height: '5\' 7"'$  (170.2 cm) Weight: 59 kg (130 lb) IBW/kg (Calculated) : 61.6  Temp (24hrs), Avg:96.5 F (35.8 C), Min:94 F (34.4 C), Max:97.5 F (36.4 C)  Recent Labs  Lab 12/12/22 1151  WBC 8.3  CREATININE 0.49    Estimated Creatinine Clearance: 39.2 mL/min (by C-G formula based on SCr of 0.49 mg/dL).    Allergies  Allergen Reactions   Asa [Aspirin] Nausea Only    Thank you for allowing pharmacy to be a part of this patient's care.  Wynona Neat, PharmD, BCPS  12/12/2022 11:35 PM

## 2022-12-12 NOTE — Progress Notes (Addendum)
Orthopedic Surgery Progress Note     Assessment: Patient is a 89 y.o. female with left intertrochanteric femur fracture status post CMN (10/05/2022) that subsequently required I&D (10/09/2022). Approximately 2 months from index surgery     Plan: -Operative plans: I&D today -Antibiotics: hold for cultures -Weight bearing status: as tolerated -NPO for procedure -Pain control: OTC medications -Discussed possible lifelong antibiotic suppression postoperatively -Dispo: to hospital from the office   ___________________________________________________________________________   Subjective: Patient is a 89 year old female who sustained a intertrochanteric hip fracture that underwent cephalomedullary nail fixation on 09/25/2022.  She subsequently persistent drainage and underwent irrigation and debridement on 10/09/2022.  Cultures grew MRSA.  Her hospital course was complicated by pneumonia and chronic and indwelling Foley catheter due to urinary retention.  She was on linezolid and then daptomycin for a total of 4 weeks.  Her antibiotics were discontinued on 11/08/2022.  She had issues with eating and decreased appetite in the hospitalization.  She lost over 10 pounds during her hospitalization.  She was able to ambulate short distances and stand for transfer.  More recently as she has become fatigued, she is not walking and is having help even for transfers.  She was on Cipro from 11/21/2022 to 11/27/2022 for UTI.  Inflammatory markers were drawn at her most recent visit with infectious disease on 12/01/2022.  Her CRP was 46 and her ESR was 34 which are both uptrending from 7.6 and 21, respectively.  She has been feeling fatigued.  She has not felt fevers or chills.  Her facility noticed drainage from her most proximal incision site 3 days ago.     Physical Exam:   General: no acute distress, appears stated age, thin relative to when first seen Neurologic: alert, answering questions appropriately,  following commands Respiratory: unlabored breathing on nasal canula, symmetric chest rise Psychiatric: slow speech, quiet   MSK:    -Left lower extremity             Distal incisions appear well-healed with no erythema, induration or palpable fluctuance.  Her most proximal incision has opened and there is purulent drainage.  It is opened about 1 cm.  There is no erythema around the incision. Fires hip flexors, quadriceps, hamstrings, tibialis anterior, gastrocnemius and soleus, extensor hallucis longus Plantarflexes and dorsiflexes toes Sensation intact to light touch in sural, saphenous, tibial, deep peroneal, and superficial peroneal nerve distributions Foot warm and well perfused     Patient name: Anna Weiss Patient MRN: [id number] Date: 12/12/22

## 2022-12-12 NOTE — H&P (Addendum)
Orthopedic Surgery Progress Note     Assessment: Patient is a 89 y.o. female with left intertrochanteric femur fracture status post CMN (10/05/2022) that subsequently required I&D (10/09/2022). Approximately 2 months from index surgery     Plan: -Operative plans: I&D today -Antibiotics: hold for cultures -Weight bearing status: as tolerated -NPO for procedure -Pain control: OTC medications -Will talk with ID about possible life-long suppression -Plan for floor post-operatively  The patient has developed a post-operative infection. Explained that in order to maximize the chances of eradicating this infection, operative management in the form of irrigation and debridement is recommended. This would also allow for the collection of specimens which will help in developing an antibiotic plan. The risks of the surgery including but not limited to persistent pain, persistent or new infection, bleeding, need for additional procedures, heart attack, stroke, wound dehiscence, persistent drainage, and death were discussed with the patient. The alternatives to surgical management include doing nothing which would likely result in chronic infection or antibiotic treatment which would be less likely to be successful at treating this infection. All the patient's questions were answered to their satisfaction. After this discussion, the patient expressed understanding and elected to proceed with surgical intervention.      ___________________________________________________________________________   Subjective: Patient is a 89 year old female who sustained a intertrochanteric hip fracture that underwent cephalomedullary nail fixation on 09/25/2022.  She subsequently persistent drainage and underwent irrigation and debridement on 10/09/2022.  Cultures grew MRSA.  Her hospital course was complicated by pneumonia and chronic and indwelling Foley catheter due to urinary retention. She had significantly decreased  appetite in the hospitalization and lost over 10 pounds during her hospitalization. She had an ileus that resolved during that admission. She was on linezolid and then daptomycin for a total of 4 weeks.  Her antibiotics were discontinued on 11/08/2022.  She was able to ambulate short distances and stand for transfer.  More recently as she has become fatigued, she is not walking and is having help even for transfers.  She was on Cipro from 11/21/2022 to 11/27/2022 for UTI.  Inflammatory markers were drawn at her most recent visit with infectious disease on 12/01/2022.  Her CRP was 46 and her ESR was 34 which are both uptrending from 7.6 and 21, respectively.  She has been feeling fatigued.  She has not felt fevers or chills.  Her facility noticed drainage from her most proximal incision site 3 days ago and contacted my office to let me know yesterday.     Past medical history: HTN HLD Urinary retention Stroke  Allergies: ASA (nausea)  Past surgical history: Left hip CMN Left hip I&D Hysterectomy  Social history:  Denies nicotine-containing product use Denies EtOH use Denies recreational drug use  Family history: reviewed and not pertinent to post-operative infection    Physical Exam:   General: no acute distress, appears stated age Neurologic: alert, answering questions appropriately, following commands Respiratory: unlabored breathing on nasal canula, symmetric chest rise Cardiovascular: regular rate, no cyanosis Psychiatric: slow speech, quiet    MSK:    -Left lower extremity             Distal incisions appear well-healed with no erythema, induration or palpable fluctuance.  Her most proximal incision has opened and there is purulent drainage.  It is opened about 1 cm.  There is no erythema around the incision. Fires hip flexors, quadriceps, hamstrings, tibialis anterior, gastrocnemius and soleus, extensor hallucis longus Plantarflexes and dorsiflexes toes Sensation intact to  light touch in sural, saphenous, tibial, deep peroneal, and superficial peroneal nerve distributions Foot warm and well perfused     Patient name: Anna Weiss Patient MRN: [id number] Date: 12/12/22

## 2022-12-13 ENCOUNTER — Encounter (HOSPITAL_COMMUNITY): Payer: Self-pay | Admitting: Orthopedic Surgery

## 2022-12-13 ENCOUNTER — Inpatient Hospital Stay (HOSPITAL_COMMUNITY): Payer: Medicare HMO

## 2022-12-13 DIAGNOSIS — T8142XD Infection following a procedure, deep incisional surgical site, subsequent encounter: Secondary | ICD-10-CM

## 2022-12-13 DIAGNOSIS — R918 Other nonspecific abnormal finding of lung field: Secondary | ICD-10-CM | POA: Diagnosis not present

## 2022-12-13 DIAGNOSIS — J9611 Chronic respiratory failure with hypoxia: Secondary | ICD-10-CM | POA: Diagnosis not present

## 2022-12-13 DIAGNOSIS — D539 Nutritional anemia, unspecified: Secondary | ICD-10-CM

## 2022-12-13 LAB — GLUCOSE, CAPILLARY
Glucose-Capillary: 97 mg/dL (ref 70–99)
Glucose-Capillary: 97 mg/dL (ref 70–99)
Glucose-Capillary: 92 mg/dL (ref 70–99)
Glucose-Capillary: 129 mg/dL — ABNORMAL HIGH (ref 70–99)

## 2022-12-13 LAB — CBC
HCT: 23.7 % — ABNORMAL LOW (ref 36.0–46.0)
Hemoglobin: 7.7 g/dL — ABNORMAL LOW (ref 12.0–15.0)
MCH: 33.2 pg (ref 26.0–34.0)
MCHC: 32.5 g/dL (ref 30.0–36.0)
MCV: 102.2 fL — ABNORMAL HIGH (ref 80.0–100.0)
Platelets: 309 10*3/uL (ref 150–400)
RBC: 2.32 MIL/uL — ABNORMAL LOW (ref 3.87–5.11)
RDW: 20.7 % — ABNORMAL HIGH (ref 11.5–15.5)
WBC: 7.9 10*3/uL (ref 4.0–10.5)
nRBC: 0 % (ref 0.0–0.2)

## 2022-12-13 LAB — PROCALCITONIN: Procalcitonin: <0.1 ng/mL

## 2022-12-13 LAB — BASIC METABOLIC PANEL
Anion gap: 10 (ref 5–15)
BUN: 9 mg/dL (ref 8–23)
CO2: 27 mmol/L (ref 22–32)
Calcium: 8.2 mg/dL — ABNORMAL LOW (ref 8.9–10.3)
Chloride: 101 mmol/L (ref 98–111)
Creatinine, Ser: 0.58 mg/dL (ref 0.44–1.00)
GFR, Estimated: >60 mL/min (ref >60)
Glucose, Bld: 86 mg/dL (ref 70–99)
Potassium: 3.9 mmol/L (ref 3.5–5.1)
Sodium: 138 mmol/L (ref 135–145)

## 2022-12-13 LAB — PREPARE RBC (CROSSMATCH)

## 2022-12-13 MED ORDER — SODIUM CHLORIDE 0.9 % IV SOLN: INTRAVENOUS | Status: DC

## 2022-12-13 MED ORDER — FAMOTIDINE 40 MG/5ML PO SUSR
40.0000 mg | Freq: Two times a day (BID) | ORAL | Status: DC
  Administered 2022-12-13 – 2022-12-17 (×8): 40 mg via ORAL
  Filled 2022-12-13 (×9): qty 5

## 2022-12-13 MED ORDER — SODIUM CHLORIDE 0.9 % IV SOLN
600.0000 mg | INTRAVENOUS | Status: DC
  Administered 2022-12-14: 600 mg via INTRAVENOUS
  Filled 2022-12-13 (×2): qty 12

## 2022-12-13 MED ORDER — SODIUM CHLORIDE 0.9% IV SOLUTION: Freq: Once | INTRAVENOUS | Status: AC

## 2022-12-13 NOTE — Significant Event (Addendum)
Rapid Response Event Note   Reason for Call :  Called originally at 2341 for T-95.9(Ax). This had increased from 94(R) at 2255. Warm blankets and increasing temperature in room encouraged.  Called back at 0105 for somnolence and HR-40s(BP-100s). Per RN, bradycardia not new per PACU RN.   Initial Focused Assessment:  Pt lying in bed with eyes closed. She will open eyes to verbal command, follow commands, and move all extremities equally. She denies pain. She goes back to sleep very easily without stimulation.  HR-40s(Afib), BP-103/80, RR-11-18, SpO2-100% on 1.5L [place].   Interventions:  CBG-97 EKG-Afib with slow ventricular  Cardiac monitoring.  Plan of Care:  Pt somnolent but awakes to voice. She is bradycardic but BP is WNL. Await return page from MD. Continue to monitor pt closely. Call RRT if further assistance needed.   Event Summary:   MD Notified: Bedside RN paged ortho MD x 2, awaiting response Call [place]   0330-Per RN, MD aware of situation, agrees with plan, NS@ 100cc/hr ordered and MD to consult [place] in AM.   Dillard Essex, RN

## 2022-12-13 NOTE — Consult Note (Addendum)
[facility]                                                                                        Patient Identification: Patient Name: Anna Weiss MRN: [id number] Admit Date: 12/12/2022 11:30 AM Today's Date: 12/13/2022 Reason for consult: wound infection, abtx recs  Requesting provider: Dr Laurance Flatten  Principal Problem:   Post-operative infection   Antibiotics:  Daptomycin 1/19 post op-c  Lines/Hardware: ORIF left hip  Assessment 42 Y O female with PMH as below including (11/2) ORIF of left inter -trochanteric fracture with the use of intra-medullary device complicated by post op infection s/p (11/16) I and D of left hip ( Cx MRSA and MRSE) and completion of 4 +weeks of linezolid>daptomycin. 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 EOT 11/08/22  1/19 S/p excisional debridement to the level of muscle , fascia  Findings: purulent material above the level of the fascia, d/w Dr Laurance Flatten with concerns for recurrent deep tissue infection and plan for prolonged IV abtx as well as po suppression thereafter given she is a poor surgical candidate for further surgeries given her age.   # Acute Hypoxic respiratory failure likely post op atelectasis  On nasal cannula  Encourage incentive spirometry   # Pulmonary nodules concerning for neoplasm/metastasis - Defer work up to primary.    Recommendations  Will switch daptomycin to Vancomycin and check for dapto sensitivity with micro ESR and CRP added on  Fu OR cultures to completion Nees PICC for proloneged abtx to be followed by PO suppression given poor surgical candidate in the setting of presence  of hardware and recurrence of infection PT and OT Following  Rest of the management as per the primary team. Please call with questions or concerns.  Thank you for the consult  Rosiland Oz, MD Infectious Disease Physician [facility] [address] [place], [place] [address] Phone: [phone]  Fax: [phone]  __________________________________________________________________________________________________________ HPI and Hospital Course: Per EMR and daughter at bedside.  89 Y O female with PMH as below including (11/2) ORIF of left inter -trochanteric fracture with the use of intra-medullary device 2/2 fall complicated by post op infection s/p (11/16) I and D of left hip ( Cx MRSA and MRSE) and completion of 4 weeks of linezolid>daptomycin. EOT 12/16 who is directly admitted from her facility due to drainage at the proximal incision site for 3-4 days prior to admit. She was also treated for UTI  12/29-1/4. Seen by Dr Candiss Norse on follow up on 1/8 where left hip surgical site was healed. However, CRP had gone up from 7.6 to 46, ESR also had gone up from 21 to 34. She was planned to fu in 3 months off abtx. Found to have purulent drainage from left hip surgical site on Ortho fu  1/19 and hence admitted for OR.   1/19 S/p excisional debridement to the level of muscle , fascia  Findings: purulent material above the level of the fascia, d/w Dr Laurance Flatten with concerns for recurrent deep tissue infection and plan for prolonged IV abtx as well as po suppression thereafter given she is a poor surgical candidate for further surgeries given her age.   ROS: General- Denies fever, chills, loss of appetite and loss of weight HEENT - Denies headache, blurry vision, neck pain, sinus pain Chest - Denies any chest pain, SOB or cough CVS- Denies any dizziness/lightheadedness, syncopal attacks, palpitations Abdomen- Denies any nausea, vomiting, abdominal  pain, hematochezia and diarrhea Neuro - Denies any weakness, numbness, tingling sensation Psych - Denies any changes in mood irritability or depressive symptoms GU- Denies any burning, dysuria, hematuria or increased frequency of urination Skin - denies any rashes/lesions MSK - denies any joint pain/swelling or restricted ROM   Past Medical History:  Diagnosis Date   High cholesterol    Hypertension    Stroke ([place])    2003   Tachycardia    Past Surgical History:  Procedure Laterality Date   ABDOMINAL HYSTERECTOMY     I & D EXTREMITY Left 10/09/2022   Procedure: IRRIGATION AND DEBRIDEMENT LEFT HIP;  Surgeon: Callie Fielding, MD;  Location: [facility];  Service: Orthopedics;  Laterality: Left;   INCISION AND DRAINAGE HIP Left 12/12/2022   Procedure: LEFT HIP WOUND IRRIGATION AND DEBRIDEMENT;  Surgeon: Callie Fielding, MD;  Location: [facility];  Service: Orthopedics;  Laterality: Left;   INTRAMEDULLARY (IM) NAIL INTERTROCHANTERIC Left 09/25/2022   Procedure: INTRAMEDULLARY (IM) NAIL INTERTROCHANTERIC;  Surgeon: Callie Fielding, MD;  Location: [facility];  Service: Orthopedics;  Laterality: Left;     Scheduled Meds:  sodium chloride   Intravenous Once   acetaminophen  650 mg Oral Q8H   Chlorhexidine Gluconate Cloth  6 each Topical Q0600   clopidogrel  75 mg Oral Daily   famotidine  40 mg Oral BID   feeding supplement  237 mL Oral BID BM   iron polysaccharides  150 mg Oral Daily   levothyroxine  50 mcg Oral Q0600   mupirocin ointment  1 Application Nasal BID   polyethylene glycol  17 g Oral Daily   potassium chloride  40 mEq Oral Daily   senna  1 tablet Oral BID   tamsulosin  0.8 mg Oral QPC supper   Continuous Infusions:  sodium chloride 100 mL/hr at 12/13/22 0427   PRN Meds:.morphine injection  Allergies  Allergen Reactions   Asa [Aspirin] Nausea Only   Social History   Socioeconomic History   Marital status: Widowed    Spouse name: Not on file   Number of children: Not on  file   Years of education: Not on file   Highest education level: Not on file  Occupational History   Not on file  Tobacco Use   Smoking status: Never   Smokeless tobacco: Never  Vaping Use   Vaping Use: Never used  Substance and Sexual Activity   Alcohol use: No   Drug use: No   Sexual activity: Not on file  Other Topics Concern   Not on file  Social History Narrative   Not on file   Social Determinants of Health   Financial Resource Strain: Not on file  Food Insecurity: No Food Insecurity (09/25/2022)   Hunger Vital Sign    Worried About Running Out of Food in the Last Year:  Never true    Ran Out of Food in the Last Year: Never true  Transportation Needs: No Transportation Needs (09/25/2022)   PRAPARE - Hydrologist (Medical): No    Lack of Transportation (Non-Medical): No  Physical Activity: Not on file  Stress: Not on file  Social Connections: Not on file  Intimate Partner Violence: Not At Risk (09/25/2022)   Humiliation, Afraid, Rape, and Kick questionnaire    Fear of Current or Ex-Partner: No    Emotionally Abused: No    Physically Abused: No    Sexually Abused: No   History reviewed. No pertinent family history.  Vitals BP (!) 120/40 (BP Location: Left Arm)   Pulse (!) 54   Temp 99 F (37.2 C)   Resp 16   Ht '5\' 7"'$  (1.702 m)   Wt 59 kg   SpO2 98%   BMI 20.36 kg/m    Physical Exam Constitutional:  elderly white female sitting up in the bed and appears comfortable, hard of hearing     Comments: mucosa moist  Cardiovascular:     Rate and Rhythm: Normal rate and regular rhythm.     Heart sounds: s1s2,   Pulmonary:     Effort: Pulmonary effort is normal on nasal cannula     Comments: Normal breath sounds   Abdominal:     Palpations: Abdomen is soft.     Tenderness: non distended and non tender   Musculoskeletal:        General: No swelling or tenderness in peripeheral joints. Left hip bandaged in a surgical dressing  C/D/I  Skin:    Comments: No rashes but multiple areas of bruising in the upper extremities likely site of IV prick   Neurological:     General: awake, alert and oriented, grossly non focal   Psychiatric:        Mood and Affect: Mood normal.    Pertinent Microbiology Results for orders placed or performed during the hospital encounter of 12/12/22  Surgical pcr screen     Status: Abnormal   Collection Time: 12/12/22 11:54 AM   Specimen: Nasal Mucosa; Nasal Swab  Result Value Ref Range Status   MRSA, PCR POSITIVE (A) NEGATIVE Final    Comment: RESULT CALLED TO, READ BACK BY AND VERIFIED WITH: RN Bobbie Stack [id number] AT 1440 BY EC    Staphylococcus aureus POSITIVE (A) NEGATIVE Final    Comment: (NOTE) The Xpert SA Assay (FDA approved for NASAL specimens in patients 58 years of age and older), is one component of a comprehensive surveillance program. It is not intended to diagnose infection nor to guide or monitor treatment. Performed at [facility], [place] [address], [place], [place] [address]   Aerobic/Anaerobic Culture w Gram Stain (surgical/deep wound)     Status: None (Preliminary result)   Collection Time: 12/12/22  7:05 PM   Specimen: Soft Tissue, Other  Result Value Ref Range Status   Specimen Description TISSUE LEFT HIP  Final   Special Requests SUPERFICAL NO 1  Final   Gram Stain   Final    MODERATE WBC PRESENT, PREDOMINANTLY PMN NO ORGANISMS SEEN    Culture   Final    RARE STAPHYLOCOCCUS AUREUS SUSCEPTIBILITIES TO FOLLOW CRITICAL RESULT CALLED TO, READ BACK BY AND VERIFIED WITH: RN ANN BATES [id number] AT 1425 BY EC Performed at [facility], [place] [address], [place], [place] [address]    Report Status PENDING  Incomplete  Aerobic/Anaerobic  Culture w Gram Stain (surgical/deep wound)     Status: None (Preliminary result)   Collection Time: 12/12/22  7:12 PM   Specimen: Soft Tissue, Other  Result Value Ref Range Status   Specimen Description TISSUE  LEFT HIP  Final   Special Requests SUPERFICIAL NO 2  Final   Gram Stain   Final    FEW WBC PRESENT, PREDOMINANTLY PMN NO ORGANISMS SEEN    Culture   Final    CULTURE REINCUBATED FOR BETTER GROWTH Performed at [facility], [place] [address] [place], [place] [address]    Report Status PENDING  Incomplete  Aerobic/Anaerobic Culture w Gram Stain (surgical/deep wound)     Status: None (Preliminary result)   Collection Time: 12/12/22  7:18 PM   Specimen: Soft Tissue, Other  Result Value Ref Range Status   Specimen Description TISSUE LEFT HIP  Final   Special Requests DEEP NO 1  Final   Gram Stain NO WBC SEEN NO ORGANISMS SEEN   Final   Culture   Final    NO GROWTH < 24 HOURS Performed at [facility], [place] [address], [place], [place] [address]    Report Status PENDING  Incomplete  Aerobic/Anaerobic Culture w Gram Stain (surgical/deep wound)     Status: None (Preliminary result)   Collection Time: 12/12/22  7:19 PM   Specimen: Soft Tissue, Other  Result Value Ref Range Status   Specimen Description TISSUE LEFT HIP  Final   Special Requests DEEP NO 2  Final   Gram Stain NO WBC SEEN NO ORGANISMS SEEN   Final   Culture   Final    NO GROWTH < 24 HOURS Performed at [facility], [place] [address], [place], [place] [address]    Report Status PENDING  Incomplete   Pertinent Lab seen by me:    Latest Ref Rng & Units 12/13/2022    9:03 AM 12/12/2022   11:51 AM 10/31/2022    3:23 PM  CBC  WBC 4.0 - 10.5 K/uL 7.9  8.3  7.5   Hemoglobin 12.0 - 15.0 g/dL 7.7  8.1  9.8   Hematocrit 36.0 - 46.0 % 23.7  26.7  30.4   Platelets 150 - 400 K/uL 309  320  154       Latest Ref Rng & Units 12/13/2022    9:03 AM 12/12/2022   11:51 AM 10/31/2022    3:23 PM  CMP  Glucose 70 - 99 mg/dL 86  113  99   BUN 8 - 23 mg/dL '9  13  24   '$ Creatinine 0.44 - 1.00 mg/dL 0.58  0.49  0.68   Sodium 135 - 145 mmol/L 138  132  140   Potassium 3.5 - 5.1 mmol/L 3.9  5.0  3.8   Chloride 98 - 111  mmol/L 101  97  105   CO2 22 - 32 mmol/L '27  26  28   '$ Calcium 8.9 - 10.3 mg/dL 8.2  8.1  8.9   Total Protein 6.5 - 8.1 g/dL   5.3   Total Bilirubin 0.3 - 1.2 mg/dL   1.1   Alkaline Phos 38 - 126 U/L   88   AST 15 - 41 U/L   25   ALT 0 - 44 U/L   29      Pertinent Imagings/Other Imagings Plain films and CT images have been personally visualized and interpreted; radiology reports have been reviewed. Decision making incorporated into the  Impression / Recommendations.  No results found.  I spent 95 minutes for this patient encounter including review of prior medical records/discussing diagnostics and treatment plan with the patient/family/coordinate care with primary/other specialits with greater than 50% of time in face to face encounter.   Electronically signed by:   Rosiland Oz, MD Infectious Disease Physician [facility] for Infectious Disease Pager: [phone]

## 2022-12-13 NOTE — Consult Note (Signed)
Initial Consultation Note   Patient: Anna Weiss:[id number] DOB: 12/16/1924 PCP: Mayra Neer, MD DOA: 12/12/2022 DOS: the patient was seen and examined on 12/13/2022 Primary service: Callie Fielding, MD  Reason for consult: Medical management  Assessment/Plan: Assessment and Plan:  Postoperative wound infection Patient with prior history of wound cultures from I&D back in 10/09/2022 which was positive for MRSA.  Patient had been treated with a 4-week course of IV daptomycin completed on 12/16.  Following I&D performed yesterday repeat cultures have been obtained and patient was started on daptomycin by Dr. Laurance Flatten. -Follow-up cultures from I patient being &D -ID consulted, will follow-up for any further   Hypotension  Blood pressures noted to be as low as 98/48 postoperatively.  Home blood pressure medications include hydralazine 25 mg every 8 hours. -Hold hydralazine due to soft blood pressures  Chronic respiratory failure with hypoxia Lung nodule Bibasilar opacities Patient O2 saturations currently may contain 2 L nasal cannula oxygen.  Review of records note patient has been requiring oxygen hospitalization in November of last year.  Patient noted to have a 2.3 noncalcified nodule in the right middle lobe with 9 mm satellite nodule in the right middle lobe suggesting possible metastatic disease on prior CT of the chest from 09/24/2022.  Daughter notes that this is not to be discussed with the patient nor further workup warranted at this time given her age and comorbidities.  Chest x-ray noting bibasilar opacities which could represent atelectasis or infiltrate slightly worse on the left with associated pleural effusion. -Progress resume parameters -Continue nasal cannula oxygen maintain O2 saturation greater than 92% -Check procalcitonin  Chronic bradycardia Heart rates 48-63.  Patient had been taken off of propranolol and verapamil during her hospitalization in  09/2022. -Continue to monitor  Normocytic anemia Hemoglobin 7.7 which is only mildly decreased from yesterday of 8.1 yesterday.  Urinary retention The plan had been to remove the Foley catheter and placed purewick. -Check bladder scan every 6 hours notify MD if greater than 350 mL of urine present. -Continue Flomax  TRH will continue to follow the patient.  HPI: Anna Weiss is a 89 y.o. female with past medical history of hypertension, sinus bradycardia, prior CVA, lung mass, and history of left intertrochanteric femur fracture s/p CMN after fall back in 09/2022 who presented with a postoperative wound infection.  After the procedure had initially been performed postoperatively patient was noted to have persistent drainage for which she required I&D on 11/16 where cultures grew out MRSA.  During that hospitalization she was noted to have heart rates as low as the 30s for which medications of propranolol and verapamil were discontinued.  She was also noted to be hypoxic and placed on 2 L of nasal cannula oxygen.  Workup and noted a left lower lobe consolidation for which the patient's daughter who is her POA did not want her to be made aware of this and did not want any further workup.  Patient was seen by ID and started on linezolid switched over to daptomycin completing 4 weeks of antibiotics on 12/16.  Patient had been more fatigued and noted to have concern for a UTI for which she had just completed a 5-day course of ciprofloxacin  on 1/4.  Patient had been seen in the office the other day and noted to have drainage coming from the wound again.  She was admitted to the hospital and underwent I&D yesterday by Dr. Laurance Flatten. Since yesterday noted hemoglobin 8.1-> 7.7, and  repeat BMP noted calcium of 8.2  Review of Systems: As mentioned in the history of present illness. All other systems reviewed and are negative. Past Medical History:  Diagnosis Date   High cholesterol    Hypertension    Stroke  [facility])    2003   Tachycardia    Past Surgical History:  Procedure Laterality Date   ABDOMINAL HYSTERECTOMY     I & D EXTREMITY Left 10/09/2022   Procedure: IRRIGATION AND DEBRIDEMENT LEFT HIP;  Surgeon: Callie Fielding, MD;  Location: [facility];  Service: Orthopedics;  Laterality: Left;   INCISION AND DRAINAGE HIP Left 12/12/2022   Procedure: LEFT HIP WOUND IRRIGATION AND DEBRIDEMENT;  Surgeon: Callie Fielding, MD;  Location: [place];  Service: Orthopedics;  Laterality: Left;   INTRAMEDULLARY (IM) NAIL INTERTROCHANTERIC Left 09/25/2022   Procedure: INTRAMEDULLARY (IM) NAIL INTERTROCHANTERIC;  Surgeon: Callie Fielding, MD;  Location: [place];  Service: Orthopedics;  Laterality: Left;   Social History:  reports that she has never smoked. She has never used smokeless tobacco. She reports that she does not drink alcohol and does not use drugs.  Allergies  Allergen Reactions   Asa [Aspirin] Nausea Only    History reviewed. No pertinent family history.  Prior to Admission medications   Medication Sig Start Date End Date Taking? Authorizing Provider  acetaminophen (TYLENOL) 325 MG tablet Take 2 tablets (650 mg total) by mouth every 6 (six) hours as needed for mild pain. Patient taking differently: Take 650 mg by mouth every 6 (six) hours as needed for mild pain. (0900, 1700) 10/27/22  Yes Setzer, Edman Circle, PA-C  ARTIFICIAL TEAR OP Place 1 drop into both eyes 2 (two) times daily.   Yes [provider]  bisacodyl (DULCOLAX) 10 MG suppository Place 10 mg rectally daily as needed for moderate constipation. If not relieved by MOM   Yes [provider]  cephALEXin (KEFLEX) 250 MG capsule Take 250 mg by mouth 2 (two) times daily. 12/10/22 12/19/22 Yes [provider]  clopidogrel (PLAVIX) 75 MG tablet Take 1 tablet (75 mg total) by mouth daily. Patient taking differently: Take 75 mg by mouth in the morning. 10/28/22  Yes Setzer, Edman Circle, PA-C  famotidine (PEPCID) 40 MG tablet Take  40 mg by mouth 2 (two) times daily.   Yes [provider]  ferrous sulfate 325 (65 FE) MG tablet Take 325 mg by mouth daily with breakfast.   Yes [provider]  guaiFENesin (MUCINEX) 600 MG 12 hr tablet Take 600 mg by mouth 2 (two) times daily as needed for cough.   Yes [provider]  guaifenesin (ROBITUSSIN) 100 MG/5ML syrup Take 200 mg by mouth every 4 (four) hours as needed for cough.   Yes [provider]  hydrALAZINE (APRESOLINE) 25 MG tablet Take 1 tablet (25 mg total) by mouth every 8 (eight) hours. Patient taking differently: Take 25 mg by mouth every 8 (eight) hours. (0600, 1400, 2200) 10/27/22  Yes Setzer, Edman Circle, PA-C  iron polysaccharides (NIFEREX) 150 MG capsule Take 1 capsule (150 mg total) by mouth daily. Patient taking differently: Take 150 mg by mouth in the morning. (1000) 10/28/22  Yes Setzer, Edman Circle, PA-C  levothyroxine (SYNTHROID) 50 MCG tablet Take 1 tablet (50 mcg total) by mouth daily at 6 (six) AM. 10/28/22  Yes Setzer, Edman Circle, PA-C  magnesium hydroxide (MILK OF MAGNESIA) 400 MG/5ML suspension Take 30 mLs by mouth daily as needed for mild constipation. 1 dose in  24 h.   Yes [provider]  Morphine Sulfate (MORPHINE CONCENTRATE) 10 MG/0.5ML SOLN concentrated solution May place 0.13 mLs (2.6 mg total) under the tongue every 4 (four) hours as needed for severe pain or shortness of breath. Patient taking differently: Place 2.6 mg under the tongue every 4 (four) hours as needed for severe pain or shortness of breath. 10/27/22  Yes Setzer, Edman Circle, PA-C  Multiple Vitamin (MULTIVITAMIN WITH MINERALS) TABS tablet Take 1 tablet by mouth daily.   Yes [provider]  NON FORMULARY Take 120 mLs by mouth 2 (two) times daily. Med Pass   Yes [provider]  NON FORMULARY Take 1 Dose by mouth daily. Magic Cup   Yes [provider]  Nutritional Supplements (ENSURE ORIGINAL) LIQD Take 237 mLs by mouth 2 (two)  times daily between meals.   Yes [provider]  ondansetron (ZOFRAN) 4 MG tablet Take 4 mg by mouth every 8 (eight) hours as needed for nausea or vomiting.   Yes [provider]  Pollen Extracts (PROSTAT PO) Take 30 mLs by mouth 2 (two) times daily.   Yes [provider]  potassium chloride (KLOR-CON) 20 MEQ packet Take 40 mEq by mouth daily. Patient taking differently: Take 40 mEq by mouth in the morning. 10/27/22  Yes Setzer, Edman Circle, PA-C  Sodium Phosphates (RA SALINE ENEMA RE) Place 1 Dose rectally daily as needed (constipation). If not relieved by Bisacodyl   Yes [provider]  tamsulosin (FLOMAX) 0.4 MG CAPS capsule Take 2 capsules (0.8 mg total) by mouth daily after supper. 10/27/22  Yes Setzer, Edman Circle, PA-C  antiseptic oral rinse (BIOTENE) LIQD 15 mLs by Mouth Rinse route as needed for dry mouth. Patient not taking: Reported on 12/13/2022 10/27/22   Barbie Banner, PA-C  bisacodyl (DULCOLAX) 10 MG suppository Place 1 suppository (10 mg total) rectally daily as needed for moderate constipation. Patient not taking: Reported on 12/13/2022 10/27/22   Vaughan Basta, Edman Circle, PA-C  famotidine (PEPCID) 40 MG/5ML suspension Place 5 mLs (40 mg total) into feeding tube 2 (two) times daily. Patient not taking: Reported on 12/13/2022 10/27/22   Barbie Banner, PA-C    Physical Exam: Vitals:   12/13/22 0545 12/13/22 0550 12/13/22 0555 12/13/22 0752  BP:    (!) 120/40  Pulse:    (!) 54  Resp: '19 16 18 16  '$ Temp:    99 F (37.2 C)  TempSrc:      SpO2: 99% 100% 100% 98%  Weight:      Height:        Constitutional: Elderly for female who appears chronically ill Eyes: PERRL, lids and conjunctivae normal ENMT: Mucous membranes are moist.   Neck: normal, supple  Respiratory: clear to auscultation bilaterally, no wheezing, no crackles.  .  Cardiovascular:Irregular,  . No extremity edema. 2+ pedal pulses. No carotid bruits.  Abdomen: no tenderness, no masses  palpated.   Bowel sounds positive.  Musculoskeletal: no clubbing / cyanosis. No joint deformity upper and lower extremities.Skin: Ecchymoses noted of the upper and lower extremities  neurologic: CN 2-12 grossly intact.  Able to move all extremities. Psychiatric: Poor memory.  Alert and oriented at least to person and place and after some time able to state that she is here for infection of her hip.  Data Reviewed:   Reviewed labs, imaging, and pertinent records as noted above in HPI   Family Communication: Daughter updated Okay with blood transfusion Primary team communication:  Thank you very much for involving [place] in the care of your patient.  Author: Norval Morton, MD 12/13/2022 11:10 AM  For on call review [email or web address].

## 2022-12-13 NOTE — Op Note (Addendum)
Orthopedic Surgery Operative Report   Procedure: Excisional debridement to the level of muscle, including skin, fat, fascia   Modifier: 78 - unplanned return to the operating room   Date of procedure:12/12/2022   Patient name: Anna Weiss MRN: [id number] DOB: 1925/03/10   Surgeon: Ileene Rubens, MD Assistant: None Pre-operative diagnosis: left hip post-operative infection Post-operative diagnosis: same as above Findings: purulent material above the level of the fascia   Specimens: none Anesthesia: general EBL: 86PY Complications: none Pre-incision antibiotic: held for cultures   Implants: none    Indication for procedure: Patient is a 89 year old female who had a ground level fall back in early November 2023. She underwent cephalomedullary fixation for an intertrochanteric femur fracture on 09/25/2022. That surgery was complicated by a post-operative infection. ID was consulted after that surgery and a 4 week course of daptomycin was completed per their recommendations. She was seen in the office on 11/19/2022 and her wounds looked well healed. She then developed drainage from her most proximal incision several days before surgery while at her facility. Her inflammatory markers were elevated. It appeared to be purulent drainage when she was seen on 12/12/2022 in office. Accordingly, operative debridement was discussed with the patient and her daughter. I did discuss serial debridement since this is a recurrent infection. She is only 2.5 months out from the index surgery so I did not feel there has been enough healing to remove the fixation. The patient and her daughter did not want to undergo repeat surgeries since she takes so long to recover from anesthesia and has had complications, like pneumonia and urinary retention after these prior surgeries. So, given her age, I talked about doing a single debridement and then possible long term antibiotic suppression. They were interested in  pursuing this as an option. I told them I would talk to my ID colleagues post-operatively to see if they agree that this is a reasonable option. After going over this, the risks of the surgery including but not limited to persistent pain, persistent or new infection, bleeding, need for additional procedures, nonunion, wound dehiscence, heart attack, stroke, and death were discussed with the patient and her daughter. The alternatives to surgical management include doing nothing which would likely result in chronic infection or antibiotic treatment which would be less likely to be successful at treating this infection. All the patient's questions were answered to her satisfaction. After this discussion, the patient expressed understanding and elected to proceed with surgical intervention.     Procedure Description: The patient was met in the pre-operative holding area. The patient's identity and consent were verified. The operative site was marked. The patient's remaining questions about surgery were answered. The patient was brought back to the operating room. General anesthesia was induced and an endotracheal tube was placed by the anesthesia staff. The patient was transferred to the operating table in the lateral position on a bean bag with the operative hip up. An axillary roll was placed in the axilla of the down arm. All bony prominences were well padded. The surgical area was cleansed with chlorhexidine scrub brush and then alcohol. No antibiotic was given prior to incision. The patient's skin was then prepped and draped in a standard, sterile fashion. A time out was performed that identified the patient, the procedure, and the laterality. All team members agreed with what was stated in the time out.    Her most proximal hip wound which was draining was incised in line with the prior  incision. This was taken down to the subcutaneous fat. The skin edges were excised to get to fresh, healthy, bleeding edges.  Within the wound purulent material was seen. This was collected with a rongeur and placed into specimen cups for cultures. A rongeur and curette was used to debride this subcutaneous tissue. All loose and necrotic material was removed. Once this area had been debrided, a freer was used to see if there was any tract going deep to the fascia. There was no opening in the fascia found. Since I suspected this was a recurrent deep infection, decision was made to open the fascia. A knife was used to incise the fascia in line with the incision and just proximal to the greater trochanter. No purulent material was seen deep the fascia. There were several loose muscle fibers but these were still red in color. They were collected as specimen for culture to help determine the extent of the infection. Next, a curette was used over the greater trochanter in the area of the nail starting point to debride scar tissue. This tissue was not bleeding. It was collected and sent as specimen. Debridement was continued until healthy bleeding tissue was seen. At this point, there was no purulent material seen in the wound. A freer was again used to palpate for any remaining pockets or planes that had been missed. There were no further areas that opened up. There was no further loose or necrotic tissue within the wound. Debridement was felt to be complete at this time.   The wound was copiously irrigated with sterile saline via cysto tubing. Three liters or saline was irrigated through the wound. The wound was then closed. 0 PDS was used to close the fascia. 2-0 PDS was used to close the deep dermal layer. 3-0 nylon was used to close the skin. The patient was extubated and brought back to the post anesthesia care unit in stable condition.    Post-operative plan: The patient will recover in the post-anesthesia care unit and then go to the floor. The patient will be placed back on daptomycin since this is what ID had recommended previously  and I suspect the same organism. ID will be consulted in the morning. Internal medicine will be consulted as well for co-management. The patient will be weight bearing as tolerated. I will have wound care come see her as well to help manage the heel ulcers and sacral ulcers she has developed while in the facility. The patient will work with physical therapy. The patient will be in the hospital at least for a couple of days while cultures and sensitivities result so she can be placed on the appropriate long term antibiotic.   Ileene Rubens, MD Orthopedic Surgeon

## 2022-12-13 NOTE — Progress Notes (Signed)
Orthopedic Surgery Progress Note   Assessment: Patient is a 89 y.o. female that underwent cephalomedullary rodding for a intertrochanteric femur fracture. Subsequently became infected and presented to the office with drainage after completing antibiotics. Now s/p repeat I&D   Plan: -Operative plans: complete -Diet: regular -Follow up intra-operative cultures (no organisms seen) -DVT ppx: resume home plavix -ID and medicine consults, appreciate recommendations -Antibiotics: daptomycin -Weight bearing status: as tolerated -PT evaluate and treat -Pain control -Dispo: remain floor status  HTN -resume home hydralazine  Urinary retention -resume flomax that was previously prescribed -remove foley, switch to pure wick  Hypothyroidism -resume home levothyroxine  Lower lobe consolidation -continue supplemental O2 as needed to maintain sat's >92% -wean O2 as able  -encourage incentive spirometry  ___________________________________________________________________________  Subjective: Was bradycardic and somnolent overnight. No other events overnight. Not having much pain in her hip this morning. Denies paresthesias and numbness.      Physical Exam:  General: no acute distress, appears stated age, thin body habitus Neurologic: alert, answering questions appropriately, following commands Respiratory: unlabored breathing on supplemental O2, symmetric chest rise  MSK:   -Left lower extremity  Dressing over hip c/d/i, distal incisions appear well healed EHL/TA/GSC intact Plantarflexes and dorsiflexes toes Sensation intact to light touch in sural, saphenous, tibial, deep peroneal, and superficial peroneal nerve distributions Foot warm and well perfused   Patient name: Anna Weiss Patient MRN: [id number] Date: 12/13/22

## 2022-12-13 NOTE — Evaluation (Signed)
Physical Therapy Evaluation Patient Details Name: Anna Weiss MRN: [id number] DOB: January 30, 1925 Today's Date: 12/13/2022  History of Present Illness  Pt is 89 year old presented to [facility] on  09/24/22 after a fall at home with lt hip fx and underwent IM nail on 09/25/22 . Developed drainage from proximal incision near the end of December while at her facility. Purulent drainage noted when seen on 12/12/22 in office. Pt is currently status post excisional debridement to the muscle of L hip post operative infection. PMH - chronic bradycardia/junctional rhythm, HTN, CVA 2003.  Clinical Impression  Pt is demonstrating functional mobility below baseline. Prior to hospitalization pt was ambulating short distances and going from sit to stand with a lot of assistance per pt. Currently pt was unable to get to standing with Max A +1 and requires Mod to Max A for bed mobility. Due to pt current functional status recommending skilled physical therapy services in SNF setting on discharge from acute care hospital in order to decrease risk for immobility, falls and re-hospitalization. Pt became fatigued and short of breathe with activity, HR was WNL with varying alarms going on including missed beat. Returned to supine.      Recommendations for follow up therapy are one component of a multi-disciplinary discharge planning process, led by the attending physician.  Recommendations may be updated based on patient status, additional functional criteria and insurance authorization.  Follow Up Recommendations Skilled nursing-short term rehab (<3 hours/day) Can patient physically be transported by private vehicle: No    Assistance Recommended at Discharge Frequent or constant Supervision/Assistance  Patient can return home with the following  Two people to help with walking and/or transfers;Help with stairs or ramp for entrance;Assistance with cooking/housework;Assist for transportation    Equipment Recommendations Other  (comment) (defer to post acute)  Recommendations for Other Services       Functional Status Assessment Patient has had a recent decline in their functional status and demonstrates the ability to make significant improvements in function in a reasonable and predictable amount of time.     Precautions / Restrictions Precautions Precautions: Fall Restrictions Weight Bearing Restrictions: Yes RLE Weight Bearing: Weight bearing as tolerated      Mobility  Bed Mobility Overal bed mobility: Needs Assistance Bed Mobility: Supine to Sit, Sit to Supine     Supine to sit: Mod assist Sit to supine: Max assist   General bed mobility comments: Heavy cueing for sequencing with bed mobility and Mod A at the trunk in order to get to sitting. Max A at bil LE and assist at the trunk and for hips to get into supine. Patient Response: Cooperative  Transfers Overall transfer level: Needs assistance Equipment used: Rolling walker (2 wheels) Transfers: Sit to/from Stand             General transfer comment: Pt was unable to get to standing with max A x 1. Heavy cueing and multiple attempts with significantly elevated bed. Pt continued to lean posteriorly and was unable to get to standing.    Ambulation/Gait     General Gait Details: unable to progress gait due to current functional status.  Stairs Stairs:  (not applicable at this time.)              Balance Overall balance assessment: Needs assistance Sitting-balance support: Bilateral upper extremity supported Sitting balance-Leahy Scale: Fair Sitting balance - Comments: Pt had one time fall posteriorly while sitting on bed and required Max A to maintain upright posture.  Pt was then able to stay upright at Close SBA with bil UE support Postural control: Posterior lean     Standing balance comment: unable to test today           Pertinent Vitals/Pain Pain Assessment Pain Assessment: No/denies pain    Home Living  Family/patient expects to be discharged to:: Private residence Living Arrangements: Alone Available Help at Discharge: Family;Available PRN/intermittently Type of Home: House Home Access: Stairs to enter Entrance Stairs-Rails: None Entrance Stairs-Number of Steps: 1   Home Layout: One level Home Equipment: Toilet riser;Cane - single point;Hand held shower head;Shower seat Additional Comments: used quad cane at baseline    Prior Function Prior Level of Function : Independent/Modified Independent             Mobility Comments: Pt states that she was at a facility prior to hospitalization but cannot remember which one. She was standing but not doing well and was "barely" walking. She was using RW for gait and was getting help with all ADL"s ADLs Comments: Has been sponge bathing more recently, likes to cook     Hand Dominance   Dominant Hand: Right    Extremity/Trunk Assessment   Upper Extremity Assessment Upper Extremity Assessment: Defer to OT evaluation    Lower Extremity Assessment Lower Extremity Assessment: Generalized weakness       Communication   Communication: No difficulties  Cognition Arousal/Alertness: Awake/alert Behavior During Therapy: WFL for tasks assessed/performed Overall Cognitive Status: Difficult to assess Area of Impairment: Following commands, Awareness, Problem solving     Following Commands: Follows one step commands inconsistently     Problem Solving: Slow processing, Decreased initiation, Difficulty sequencing, Requires verbal cues, Requires tactile cues General Comments: pt was able to state year, reason she as here and that she is at [place].        General Comments General comments (skin integrity, edema, etc.): Pt was at SNF prior to hospitalization. She states that she was walking poorly and using RW. Difficult to assess cognition.        Assessment/Plan    PT Assessment Patient needs continued PT services  PT Problem  List Decreased strength;Decreased mobility;Decreased balance;Decreased activity tolerance       PT Treatment Interventions DME instruction;Therapeutic exercise;Gait training;Balance training;Neuromuscular re-education;Functional mobility training;Cognitive remediation;Therapeutic activities;Patient/family education    PT Goals (Current goals can be found in the Care Plan section)  Acute Rehab PT Goals Patient Stated Goal: go back to SNF PT Goal Formulation: With patient Time For Goal Achievement: 12/27/22 Potential to Achieve Goals: Fair    Frequency Min 2X/week        AM-PAC PT "6 Clicks" Mobility  Outcome Measure Help needed turning from your back to your side while in a flat bed without using bedrails?: A Lot Help needed moving from lying on your back to sitting on the side of a flat bed without using bedrails?: A Lot Help needed moving to and from a bed to a chair (including a wheelchair)?: Total Help needed standing up from a chair using your arms (e.g., wheelchair or bedside chair)?: Total Help needed to walk in hospital room?: Total Help needed climbing 3-5 steps with a railing? : Total 6 Click Score: 8    End of Session Equipment Utilized During Treatment: Gait belt Activity Tolerance: Patient tolerated treatment well Patient left: in bed;with bed alarm set;with call bell/phone within reach Nurse Communication: Mobility status PT Visit Diagnosis: Other abnormalities of gait and mobility (R26.89);Muscle weakness (generalized) (M62.81)  Time: 6431-4276 PT Time Calculation (min) (ACUTE ONLY): 33 min   Charges:   PT Evaluation $PT Eval Low Complexity: 1 Low PT Treatments $Therapeutic Activity: 8-22 mins        Tomma Rakers, DPT, CLT  Acute Rehabilitation Services Office: [phone] (Secure chat preferred)   Anna Weiss 12/13/2022, 3:20 PM

## 2022-12-13 NOTE — Progress Notes (Signed)
Pharmacy Antibiotic Note  Anna Weiss is a 89 y.o. female admitted on 12/12/2022 with infected hardware, now s/p I&D. Pharmacy has been consulted for daptomycin dosing.  Of note pt had deep tissue infection in Nov, initially on vanc for MRSA, changed by ID to dapto for OPAT, completed 12/15.   First dose of Daptomycin given ~5am today, requires ID approval. Dr. Laurance Flatten d/w Dr. [place] Bali, who authorized continuing Daptomycin. She will see patient on 12/14/22.  Plan: Continue Daptomycin 600 mg (~10 mg/kg) IV q24h. Follow up ID consult and recommendations. Will plan to begin weekly CK on Monday if Daptomycin to continue. Follow renal function, operative cultures, clinical course.  Height: '5\' 7"'$  (170.2 cm) Weight: 59 kg (130 lb) IBW/kg (Calculated) : 61.6  Temp (24hrs), Avg:97 F (36.1 C), Min:94 F (34.4 C), Max:99 F (37.2 C)  Recent Labs  Lab 12/12/22 1151 12/13/22 0903  WBC 8.3 7.9  CREATININE 0.49 0.58    Estimated Creatinine Clearance: 39.2 mL/min (by C-G formula based on SCr of 0.58 mg/dL).    Allergies  Allergen Reactions   Asa [Aspirin] Nausea Only    Antimicrobials this admission: Daptomycin 1/20 >> Bactroban/CHG 1/20>>(1/24)  Dose adjustments this admission: n/a  Microbiology results: Bactroban/CHG 1/20>>(1/24)   1/19 tissue L hip (deep No 2): no org on GS, pending 1/19 tissue L hip (superficial No 1): no org on GS, pending 1/19 tissue L hip (deep No 1): no org on GS, pending 1/19 tissue L hip (superficial No 2): no org on GS, pending 1/19 MRSA PCR: positive  Thank you for allowing pharmacy to be a part of this patient's care.  Arty Baumgartner, [place] 12/13/2022 5:05 PM

## 2022-12-14 DIAGNOSIS — J90 Pleural effusion, not elsewhere classified: Secondary | ICD-10-CM | POA: Insufficient documentation

## 2022-12-14 DIAGNOSIS — R338 Other retention of urine: Secondary | ICD-10-CM | POA: Diagnosis not present

## 2022-12-14 DIAGNOSIS — R001 Bradycardia, unspecified: Secondary | ICD-10-CM | POA: Diagnosis not present

## 2022-12-14 DIAGNOSIS — R911 Solitary pulmonary nodule: Secondary | ICD-10-CM | POA: Insufficient documentation

## 2022-12-14 DIAGNOSIS — I959 Hypotension, unspecified: Secondary | ICD-10-CM | POA: Insufficient documentation

## 2022-12-14 DIAGNOSIS — J9611 Chronic respiratory failure with hypoxia: Secondary | ICD-10-CM | POA: Diagnosis not present

## 2022-12-14 DIAGNOSIS — T8142XD Infection following a procedure, deep incisional surgical site, subsequent encounter: Secondary | ICD-10-CM | POA: Diagnosis not present

## 2022-12-14 LAB — C-REACTIVE PROTEIN: CRP: 7.5 mg/dL — ABNORMAL HIGH (ref <1.0)

## 2022-12-14 LAB — BPAM RBC
Blood Product Expiration Date: 202402152359
ISSUE DATE / TIME: 202401201749
Unit Type and Rh: 6200

## 2022-12-14 LAB — TYPE AND SCREEN
ABO/RH(D): A POS
Antibody Screen: NEGATIVE
Unit division: 0

## 2022-12-14 LAB — SEDIMENTATION RATE: Sed Rate: 28 mm/hr — ABNORMAL HIGH (ref 0–22)

## 2022-12-14 LAB — BASIC METABOLIC PANEL
Anion gap: 8 (ref 5–15)
BUN: 9 mg/dL (ref 8–23)
CO2: 24 mmol/L (ref 22–32)
Calcium: 8.1 mg/dL — ABNORMAL LOW (ref 8.9–10.3)
Chloride: 104 mmol/L (ref 98–111)
Creatinine, Ser: 0.52 mg/dL (ref 0.44–1.00)
GFR, Estimated: >60 mL/min (ref >60)
Glucose, Bld: 120 mg/dL — ABNORMAL HIGH (ref 70–99)
Potassium: 4.2 mmol/L (ref 3.5–5.1)
Sodium: 136 mmol/L (ref 135–145)

## 2022-12-14 LAB — CBC
HCT: 28.1 % — ABNORMAL LOW (ref 36.0–46.0)
Hemoglobin: 9.1 g/dL — ABNORMAL LOW (ref 12.0–15.0)
MCH: 32.9 pg (ref 26.0–34.0)
MCHC: 32.4 g/dL (ref 30.0–36.0)
MCV: 101.4 fL — ABNORMAL HIGH (ref 80.0–100.0)
Platelets: 297 10*3/uL (ref 150–400)
RBC: 2.77 MIL/uL — ABNORMAL LOW (ref 3.87–5.11)
RDW: 19.9 % — ABNORMAL HIGH (ref 11.5–15.5)
WBC: 8.3 10*3/uL (ref 4.0–10.5)
nRBC: 0 % (ref 0.0–0.2)

## 2022-12-14 MED ORDER — COLLAGENASE 250 UNIT/GM EX OINT
TOPICAL_OINTMENT | Freq: Every day | CUTANEOUS | Status: DC
  Filled 2022-12-14: qty 30

## 2022-12-14 MED ORDER — VANCOMYCIN HCL 750 MG/150ML IV SOLN
750.0000 mg | INTRAVENOUS | Status: DC
  Administered 2022-12-15 – 2022-12-16 (×2): 750 mg via INTRAVENOUS
  Filled 2022-12-14 (×2): qty 150

## 2022-12-14 NOTE — Plan of Care (Signed)

## 2022-12-14 NOTE — Progress Notes (Signed)
Pharmacy Antibiotic Note  Anna Weiss is a 89 y.o. female admitted on 12/12/2022 with infected hardware, now s/p I&D. Pharmacy was consulted for daptomycin dosing.  Of note pt had deep tissue infection in Nov, initially on vanc for MRSA, changed by ID to dapto for OPAT, completed 12/15.   Daptomycin given 1/20 and 1/21 early am. Now changing to Vancomycin for deep tissue infection per ID.  Noted plan to have check for Daptomycin sensitivities.  Plan: Vancomycin 750 mg IV q24hrs to begin 1/22 am. Goal AUC 400-550. Expected AUC: 478 SCr used: 0.8 Will follow renal function, final operative culture results, clinical course, antibiotic plans. Vanc levels at steady state if Vanc to continue.  Height: '5\' 7"'$  (170.2 cm) Weight: 59 kg (130 lb) IBW/kg (Calculated) : 61.6  Temp (24hrs), Avg:98 F (36.7 C), Min:97.6 F (36.4 C), Max:98.2 F (36.8 C)  Recent Labs  Lab 12/12/22 1151 12/13/22 0903 12/14/22 1210  WBC 8.3 7.9 8.3  CREATININE 0.49 0.58 0.52     Estimated Creatinine Clearance: 39.2 mL/min (by C-G formula based on SCr of 0.52 mg/dL).    Allergies  Allergen Reactions   Asa [Aspirin] Nausea Only    Antimicrobials this admission: Daptomycin 1/20 >>1/21 Vancomycin 1/22 >> Bactroban/CHG 1/20>>(1/24)  Dose adjustments this admission: n/a  Microbiology results: 1/19 tissue L hip (deep No 2): no org on GS, no growth x 2 days to date 1/19 tissue L hip (deep No 1): no org on GS, no growth x 2 days to date 1/19 tissue L hip (superficial No 2): no org on GS, rare Staph aureus, pending 1/19 tissue L hip (superficial No 1): no org on GS, rare MRSA, MIC to Vanc = 1 1/19 MRSA PCR: positive  Thank you for allowing pharmacy to be a part of this patient's care.  Arty Baumgartner, [place] 12/14/2022 5:12 PM

## 2022-12-14 NOTE — Progress Notes (Signed)
Hospitalist Consult Progress Note   Anna Weiss  WSF:[id number]  DOB: October 21, 1925  DOA: 12/12/2022  PCP: Mayra Neer, MD  Requesting provider: Dr. Laurance Flatten  Reason for consultation: medical management    History of Present Illness: Anna Weiss is a 89 y.o. female with PMH HTN, sinus bradycardia, CVA, lung mass. She had recent left intertrochanteric femur fracture s/p CMN 09/2022.  She had developed a postop wound infection and underwent I&D with cultures growing MRSA.  She also was found to be mildly hypoxic during that hospitalization and required 2 L oxygen.  She had worsening of her sinus bradycardia and was taken off of propranolol and verapamil. She completed a 4-week course of daptomycin on 11/08/2022. Prior to this hospitalization, she had also been on ciprofloxacin for treatment of a UTI and completed course on 11/27/2022. On outpatient evaluation she was found to have drainage from left hip and was referred to the hospital for repeat I&D.  She underwent I&D on 12/12/2022.   Hospitalist service following for medical management.   Interval History: No events overnight.  Resting in bed when seen this morning and in no distress.  2 L oxygen in place.  Denies any significant pain in her left thigh.  Assessment and Plan:  Hypotension  - Blood pressures noted to be as low as 98/48 postoperatively.   -BP now improved this morning, still some low diastolics.  All BP meds on hold  Chronic respiratory failure with hypoxia Right Lung nodules Left pleural effusion Bibasilar opacities -Appears has been on oxygen since November 2023 although low requirements - Continue trying to wean as able.  Suspect chronic component of atelectasis if mobility status remains poor; CXR also appears consistent with bibasilar atelectasis after review -Try to encourage incentive spirometer as able -CT chest from 09/24/2022 notes 2.3 cm nodule RML and 9 mm satellite nodule RML suggesting  possible metastatic disease as well; daughter aware and no further workup wished to be pursued and she also wishes for this information to not be discussed with patient - pleural effusion noted; possibly malignant; would not pursue treatment unless becomes symptomatic   Chronic bradycardia - baseline 40s-60s and appears symptomatic -No longer on propranolol or verapamil - Continue telemetry  Normocytic anemia -Suspected some postop loss - Relatively asymptomatic - Continue trending and well transfuse if necessary - Hemoglobin 7.7 g/dL 1/20   Urinary retention - s/p foley - renal function normal and stable  - continue purewick and bladder scans for now  Postoperative wound infection, left hip Patient with prior history of wound cultures from I&D back in 10/09/2022 which was positive for MRSA.  Patient had been treated with a 4-week course of IV daptomycin completed on 12/16.   - now s/p repeat I&D on 12/12/22 - continue daptomycin as per ID and primary service  Past Medical History: Past Medical History:  Diagnosis Date   High cholesterol    Hypertension    Stroke [facility])    2003   Tachycardia     Past Surgical History: Past Surgical History:  Procedure Laterality Date   ABDOMINAL HYSTERECTOMY     I & D EXTREMITY Left 10/09/2022   Procedure: IRRIGATION AND DEBRIDEMENT LEFT HIP;  Surgeon: Callie Fielding, MD;  Location: [facility];  Service: Orthopedics;  Laterality: Left;   INCISION AND DRAINAGE HIP Left 12/12/2022   Procedure: LEFT HIP WOUND IRRIGATION AND DEBRIDEMENT;  Surgeon: Laurance Flatten,  Venia Carbon, MD;  Location: [facility];  Service: Orthopedics;  Laterality: Left;   INTRAMEDULLARY (IM) NAIL INTERTROCHANTERIC Left 09/25/2022   Procedure: INTRAMEDULLARY (IM) NAIL INTERTROCHANTERIC;  Surgeon: Callie Fielding, MD;  Location: [facility];  Service: Orthopedics;  Laterality: Left;    Allergies:   Allergies  Allergen Reactions   Asa [Aspirin] Nausea Only    Social History:  reports that  she has never smoked. She has never used smokeless tobacco. She reports that she does not drink alcohol and does not use drugs.  Family History: History reviewed. No pertinent family history.   Objective: Physical Exam: Vitals:   12/14/22 0520 12/14/22 0600 12/14/22 0730 12/14/22 1000  BP: (!) 125/45     Pulse:   68   Resp: 14 (!) 22 (!) 21 16  Temp: 97.7 F (36.5 C)     TempSrc: Oral     SpO2: 98% 99% 100% 96%  Weight:      Height:       Physical Exam Constitutional:      General: She is not in acute distress.    Appearance: Normal appearance. She is not ill-appearing.  HENT:     Head: Normocephalic and atraumatic.     Mouth/Throat:     Mouth: Mucous membranes are moist.  Eyes:     Extraocular Movements: Extraocular movements intact.  Cardiovascular:     Rate and Rhythm: Normal rate and regular rhythm.  Pulmonary:     Effort: Pulmonary effort is normal. No respiratory distress.     Breath sounds: Normal breath sounds. No wheezing.  Abdominal:     General: Bowel sounds are normal. There is no distension.     Palpations: Abdomen is soft.     Tenderness: There is no abdominal tenderness.  Musculoskeletal:     Cervical back: Normal range of motion and neck supple.     Comments: Left hip edematous but compartments soft; minimal TTP  Skin:    Comments: Scattered senile bruising  Neurological:     General: No focal deficit present.     Mental Status: She is alert.  Psychiatric:        Mood and Affect: Mood normal.     Data reviewed:  I have personally reviewed following labs and imaging studies Results for orders placed or performed during the hospital encounter of 12/12/22 (from the past 48 hour(s))  CBC     Status: Abnormal   Collection Time: 12/12/22 11:51 AM  Result Value Ref Range   WBC 8.3 4.0 - 10.5 K/uL   RBC 2.51 (L) 3.87 - 5.11 MIL/uL   Hemoglobin 8.1 (L) 12.0 - 15.0 g/dL   HCT 26.7 (L) 36.0 - 46.0 %   MCV 106.4 (H) 80.0 - 100.0 fL   MCH 32.3 26.0 -  34.0 pg   MCHC 30.3 30.0 - 36.0 g/dL   RDW 21.0 (H) 11.5 - 15.5 %   Platelets 320 150 - 400 K/uL   nRBC 0.0 0.0 - 0.2 %    Comment: Performed at [facility], [address], [place], [place] [address]  Basic metabolic panel     Status: Abnormal   Collection Time: 12/12/22 11:51 AM  Result Value Ref Range   Sodium 132 (L) 135 - 145 mmol/L   Potassium 5.0 3.5 - 5.1 mmol/L    Comment: HEMOLYSIS AT THIS LEVEL MAY AFFECT RESULT   Chloride 97 (L) 98 - 111 mmol/L   CO2 26 22 - 32 mmol/L  Glucose, Bld 113 (H) 70 - 99 mg/dL    Comment: Glucose reference range applies only to samples taken after fasting for at least 8 hours.   BUN 13 8 - 23 mg/dL   Creatinine, Ser 0.49 0.44 - 1.00 mg/dL   Calcium 8.1 (L) 8.9 - 10.3 mg/dL   GFR, Estimated >60 >60 mL/min    Comment: (NOTE) Calculated using the CKD-EPI Creatinine Equation (2021)    Anion gap 9 5 - 15    Comment: Performed at [facility] [address], [place], [place] [address]  Surgical pcr screen     Status: Abnormal   Collection Time: 12/12/22 11:54 AM   Specimen: Nasal Mucosa; Nasal Swab  Result Value Ref Range   MRSA, PCR POSITIVE (A) NEGATIVE    Comment: RESULT CALLED TO, READ BACK BY AND VERIFIED WITH: RN Bobbie Stack [id number] AT 1440 BY EC    Staphylococcus aureus POSITIVE (A) NEGATIVE    Comment: (NOTE) The Xpert SA Assay (FDA approved for NASAL specimens in patients 49 years of age and older), is one component of a comprehensive surveillance program. It is not intended to diagnose infection nor to guide or monitor treatment. Performed at [facility], [place] [address], [place], [place] [address]   Aerobic/Anaerobic Culture w Gram Stain (surgical/deep wound)     Status: None (Preliminary result)   Collection Time: 12/12/22  7:05 PM   Specimen: Soft Tissue, Other  Result Value Ref Range   Specimen Description TISSUE LEFT HIP    Special Requests SUPERFICAL NO 1    Gram Stain      MODERATE WBC PRESENT,  PREDOMINANTLY PMN NO ORGANISMS SEEN    Culture      RARE STAPHYLOCOCCUS AUREUS SUSCEPTIBILITIES TO FOLLOW CRITICAL RESULT CALLED TO, READ BACK BY AND VERIFIED WITH: RN ANN BATES [id number] AT 1425 BY EC Performed at [facility], [place] [address], [place], [place] [address]    Report Status PENDING   Aerobic/Anaerobic Culture w Gram Stain (surgical/deep wound)     Status: None (Preliminary result)   Collection Time: 12/12/22  7:12 PM   Specimen: Soft Tissue, Other  Result Value Ref Range   Specimen Description TISSUE LEFT HIP    Special Requests SUPERFICIAL NO 2    Gram Stain      FEW WBC PRESENT, PREDOMINANTLY PMN NO ORGANISMS SEEN Performed at [facility], [place] [address], [place], [place] [address]    Culture RARE STAPHYLOCOCCUS AUREUS    Report Status PENDING   Aerobic/Anaerobic Culture w Gram Stain (surgical/deep wound)     Status: None (Preliminary result)   Collection Time: 12/12/22  7:18 PM   Specimen: Soft Tissue, Other  Result Value Ref Range   Specimen Description TISSUE LEFT HIP    Special Requests DEEP NO 1    Gram Stain NO WBC SEEN NO ORGANISMS SEEN     Culture      NO GROWTH < 24 HOURS Performed at [facility], [place] [address] [place],  [address]    Report Status PENDING   Aerobic/Anaerobic Culture w Gram Stain (surgical/deep wound)     Status: None (Preliminary result)   Collection Time: 12/12/22  7:19 PM   Specimen: Soft Tissue, Other  Result Value Ref Range   Specimen Description TISSUE LEFT HIP    Special Requests DEEP NO 2    Gram Stain NO WBC SEEN NO ORGANISMS SEEN     Culture  NO GROWTH < 24 HOURS Performed at [facility] [address] [place], [place] [address]    Report Status PENDING   Glucose, capillary     Status: None   Collection Time: 12/13/22  1:59 AM  Result Value Ref Range   Glucose-Capillary 97 70 - 99 mg/dL    Comment: Glucose reference range applies only to samples taken after fasting for at  least 8 hours.  Glucose, capillary     Status: None   Collection Time: 12/13/22  7:49 AM  Result Value Ref Range   Glucose-Capillary 97 70 - 99 mg/dL    Comment: Glucose reference range applies only to samples taken after fasting for at least 8 hours.   Comment 1 Notify RN    Comment 2 Document in Chart   CBC     Status: Abnormal   Collection Time: 12/13/22  9:03 AM  Result Value Ref Range   WBC 7.9 4.0 - 10.5 K/uL   RBC 2.32 (L) 3.87 - 5.11 MIL/uL   Hemoglobin 7.7 (L) 12.0 - 15.0 g/dL   HCT 23.7 (L) 36.0 - 46.0 %   MCV 102.2 (H) 80.0 - 100.0 fL   MCH 33.2 26.0 - 34.0 pg   MCHC 32.5 30.0 - 36.0 g/dL   RDW 20.7 (H) 11.5 - 15.5 %   Platelets 309 150 - 400 K/uL   nRBC 0.0 0.0 - 0.2 %    Comment: Performed at [facility], [place] [address] [place], [place] [address]  Basic metabolic panel     Status: Abnormal   Collection Time: 12/13/22  9:03 AM  Result Value Ref Range   Sodium 138 135 - 145 mmol/L   Potassium 3.9 3.5 - 5.1 mmol/L   Chloride 101 98 - 111 mmol/L   CO2 27 22 - 32 mmol/L   Glucose, Bld 86 70 - 99 mg/dL    Comment: Glucose reference range applies only to samples taken after fasting for at least 8 hours.   BUN 9 8 - 23 mg/dL   Creatinine, Ser 0.58 0.44 - 1.00 mg/dL   Calcium 8.2 (L) 8.9 - 10.3 mg/dL   GFR, Estimated >60 >60 mL/min    Comment: (NOTE) Calculated using the CKD-EPI Creatinine Equation (2021)    Anion gap 10 5 - 15    Comment: Performed at [facility] [address], [place], [place] [address]  Glucose, capillary     Status: None   Collection Time: 12/13/22 11:11 AM  Result Value Ref Range   Glucose-Capillary 92 70 - 99 mg/dL    Comment: Glucose reference range applies only to samples taken after fasting for at least 8 hours.   Comment 1 Notify RN    Comment 2 Document in Chart   Type and screen [place]     Status: None (Preliminary result)   Collection Time: 12/13/22  4:30 PM  Result Value Ref Range   ABO/RH(D) A  POS    Antibody Screen NEG    Sample Expiration 12/16/2022,2359    Unit Number [id number]    Blood Component Type RED CELLS,LR    Unit division 00    Status of Unit ISSUED    Transfusion Status OK TO TRANSFUSE    Crossmatch Result      Compatible Performed at [facility], [place] [address] [place], [place] [address]   Prepare RBC (crossmatch)     Status: None   Collection Time:  12/13/22  4:36 PM  Result Value Ref Range   Order Confirmation      ORDERS RECEIVED TO CROSSMATCH Performed at [facility], [place] [address], [place], [place] [address]   Glucose, capillary     Status: Abnormal   Collection Time: 12/13/22  4:56 PM  Result Value Ref Range   Glucose-Capillary 129 (H) 70 - 99 mg/dL    Comment: Glucose reference range applies only to samples taken after fasting for at least 8 hours.   Comment 1 Notify RN    Comment 2 Document in Chart   Procalcitonin - Baseline     Status: None   Collection Time: 12/13/22  7:17 PM  Result Value Ref Range   Procalcitonin <0.10 ng/mL    Comment:        Interpretation: PCT (Procalcitonin) <= 0.5 ng/mL: Systemic infection (sepsis) is not likely. Local bacterial infection is possible. (NOTE)       Sepsis PCT Algorithm           Lower Respiratory Tract                                      Infection PCT Algorithm    ----------------------------     ----------------------------         PCT < 0.25 ng/mL                PCT < 0.10 ng/mL          Strongly encourage             Strongly discourage   discontinuation of antibiotics    initiation of antibiotics    ----------------------------     -----------------------------       PCT 0.25 - 0.50 ng/mL            PCT 0.10 - 0.25 ng/mL               OR       >80% decrease in PCT            Discourage initiation of                                            antibiotics      Encourage discontinuation           of antibiotics    ----------------------------      -----------------------------         PCT >= 0.50 ng/mL              PCT 0.26 - 0.50 ng/mL               AND        <80% decrease in PCT             Encourage initiation of                                             antibiotics       Encourage continuation           of antibiotics    ----------------------------     -----------------------------        PCT >= 0.50 ng/mL  PCT > 0.50 ng/mL               AND         increase in PCT                  Strongly encourage                                      initiation of antibiotics    Strongly encourage escalation           of antibiotics                                     -----------------------------                                           PCT <= 0.25 ng/mL                                                 OR                                        > 80% decrease in PCT                                      Discontinue / Do not initiate                                             antibiotics  Performed at [facility], [address], [place], [place] [address]     Labs:  CBC: Recent Labs  Lab 12/12/22 1151 12/13/22 0903  WBC 8.3 7.9  HGB 8.1* 7.7*  HCT 26.7* 23.7*  MCV 106.4* 102.2*  PLT 320 973    Basic Metabolic Panel: Recent Labs  Lab 12/12/22 1151 12/13/22 0903  NA 132* 138  K 5.0 3.9  CL 97* 101  CO2 26 27  GLUCOSE 113* 86  BUN 13 9  CREATININE 0.49 0.58  CALCIUM 8.1* 8.2*   GFR Estimated Creatinine Clearance: 39.2 mL/min (by C-G formula based on SCr of 0.58 mg/dL). Liver Function Tests: No results for input(s): "AST", "ALT", "ALKPHOS", "BILITOT", "PROT", "ALBUMIN" in the last 168 hours. No results for input(s): "LIPASE", "AMYLASE" in the last 168 hours. No results for input(s): "AMMONIA" in the last 168 hours. Coagulation profile No results for input(s): "INR", "PROTIME" in the last 168 hours.  Cardiac Enzymes: No results for input(s): "CKTOTAL", "CKMB", "CKMBINDEX", "TROPONINI"  in the last 168 hours. BNP: Invalid input(s): "POCBNP" CBG: Recent Labs  Lab 12/13/22 0159 12/13/22 0749 12/13/22 1111 12/13/22 1656  GLUCAP 97 97 92 129*   D-Dimer No results for input(s): "DDIMER" in the last 72 hours. Hgb A1c No results for input(s): "HGBA1C" in the last 72 hours. Lipid  Profile No results for input(s): "CHOL", "HDL", "LDLCALC", "TRIG", "CHOLHDL", "LDLDIRECT" in the last 72 hours. Thyroid function studies No results for input(s): "TSH", "T4TOTAL", "T3FREE", "THYROIDAB" in the last 72 hours.  Invalid input(s): "FREET3" Anemia work up No results for input(s): "VITAMINB12", "FOLATE", "FERRITIN", "TIBC", "IRON", "RETICCTPCT" in the last 72 hours. Urinalysis    Component Value Date/Time   COLORURINE YELLOW 10/31/2022 1540   APPEARANCEUR CLOUDY (A) 10/31/2022 1540   LABSPEC 1.016 10/31/2022 1540   PHURINE 7.0 10/31/2022 1540   GLUCOSEU NEGATIVE 10/31/2022 1540   HGBUR NEGATIVE 10/31/2022 1540   BILIRUBINUR NEGATIVE 10/31/2022 1540   KETONESUR NEGATIVE 10/31/2022 1540   PROTEINUR NEGATIVE 10/31/2022 1540   UROBILINOGEN 1.0 01/02/2014 1405   NITRITE NEGATIVE 10/31/2022 1540   LEUKOCYTESUR NEGATIVE 10/31/2022 1540    Microbiology Recent Results (from the past 240 hour(s))  Surgical pcr screen     Status: Abnormal   Collection Time: 12/12/22 11:54 AM   Specimen: Nasal Mucosa; Nasal Swab  Result Value Ref Range Status   MRSA, PCR POSITIVE (A) NEGATIVE Final    Comment: RESULT CALLED TO, READ BACK BY AND VERIFIED WITH: RN Bobbie Stack [id number] AT 1440 BY EC    Staphylococcus aureus POSITIVE (A) NEGATIVE Final    Comment: (NOTE) The Xpert SA Assay (FDA approved for NASAL specimens in patients 30 years of age and older), is one component of a comprehensive surveillance program. It is not intended to diagnose infection nor to guide or monitor treatment. Performed at [facility], [place] [address] [place], [place] [address]   Aerobic/Anaerobic  Culture w Gram Stain (surgical/deep wound)     Status: None (Preliminary result)   Collection Time: 12/12/22  7:05 PM   Specimen: Soft Tissue, Other  Result Value Ref Range Status   Specimen Description TISSUE LEFT HIP  Final   Special Requests SUPERFICAL NO 1  Final   Gram Stain   Final    MODERATE WBC PRESENT, PREDOMINANTLY PMN NO ORGANISMS SEEN    Culture   Final    RARE STAPHYLOCOCCUS AUREUS SUSCEPTIBILITIES TO FOLLOW CRITICAL RESULT CALLED TO, READ BACK BY AND VERIFIED WITH: RN ANN BATES [id number] AT 1425 BY EC Performed at [facility], [place] [address], [place], [place] [address]    Report Status PENDING  Incomplete  Aerobic/Anaerobic Culture w Gram Stain (surgical/deep wound)     Status: None (Preliminary result)   Collection Time: 12/12/22  7:12 PM   Specimen: Soft Tissue, Other  Result Value Ref Range Status   Specimen Description TISSUE LEFT HIP  Final   Special Requests SUPERFICIAL NO 2  Final   Gram Stain   Final    FEW WBC PRESENT, PREDOMINANTLY PMN NO ORGANISMS SEEN Performed at [facility], [place] [address] [place], [place] [address]    Culture RARE STAPHYLOCOCCUS AUREUS  Final   Report Status PENDING  Incomplete  Aerobic/Anaerobic Culture w Gram Stain (surgical/deep wound)     Status: None (Preliminary result)   Collection Time: 12/12/22  7:18 PM   Specimen: Soft Tissue, Other  Result Value Ref Range Status   Specimen Description TISSUE LEFT HIP  Final   Special Requests DEEP NO 1  Final   Gram Stain NO WBC SEEN NO ORGANISMS SEEN   Final   Culture   Final    NO GROWTH < 24 HOURS Performed at [facility], [place] [address], [place], [place] [address]    Report Status PENDING  Incomplete  Aerobic/Anaerobic Culture w Gram Stain (surgical/deep wound)     Status: None (Preliminary result)   Collection Time: 12/12/22  7:19 PM   Specimen: Soft Tissue, Other  Result Value Ref Range Status   Specimen Description TISSUE LEFT HIP  Final   Special  Requests DEEP NO 2  Final   Gram Stain NO WBC SEEN NO ORGANISMS SEEN   Final   Culture   Final    NO GROWTH < 24 HOURS Performed at [facility], [place] [address], [place], [place] [address]    Report Status PENDING  Incomplete    Inpatient Medications:   Scheduled Meds:  Chlorhexidine Gluconate Cloth  6 each Topical Q0600   clopidogrel  75 mg Oral Daily   famotidine  40 mg Oral BID   feeding supplement  237 mL Oral BID BM   iron polysaccharides  150 mg Oral Daily   levothyroxine  50 mcg Oral Q0600   mupirocin ointment  1 Application Nasal BID   polyethylene glycol  17 g Oral Daily   potassium chloride  40 mEq Oral Daily   senna  1 tablet Oral BID   tamsulosin  0.8 mg Oral QPC supper   PRN Meds: morphine injection Continuous Infusions:  sodium chloride 100 mL/hr at 12/13/22 0427   DAPTOmycin (CUBICIN) 600 mg in sodium chloride 0.9 % IVPB 600 mg (12/14/22 0648)    Radiological Exams on Admission: DG CHEST PORT 1 VIEW  Result Date: 12/13/2022 CLINICAL DATA:  Hypoxia EXAM: PORTABLE CHEST 1 VIEW COMPARISON:  Portable exam 1504 hours compared to 10/31/2022 FINDINGS: Enlargement of cardiac silhouette. Mediastinal contours and pulmonary vascularity normal. Atherosclerotic calcification aorta. Bibasilar opacities LEFT greater than RIGHT, slightly increased on LEFT. Associated LEFT pleural effusion. Upper lungs clear. Osseous demineralization. No pneumothorax. IMPRESSION: Bibasilar opacities which could represent atelectasis or infiltrate, greater on LEFT, slightly increased on LEFT. Associated LEFT pleural effusion. Aortic Atherosclerosis (ICD10-I70.0). Electronically Signed   By: Lavonia Dana M.D.   On: 12/13/2022 16:42    Thank you for this consultation. [facility] hospitalist team will follow the patient with you.   Time spent: Greater than 50% of the 35 minute visit was spent in counseling/coordination of care for the patient as laid out in the A&P.   Dwyane Dee M.D. [facility] 12/14/2022, 11:00 AM Pager: Secure chat

## 2022-12-14 NOTE — Consult Note (Signed)
WOC Nurse Consult Note: Reason for Consult:Unstageable pressure injuries to right ischial tuberosity and left heel, deep tissue pressure injury (DTPI) to right heel. Braden scare score is 13, high risk for skin breakdown. Wound type:pressure Pressure Injury POA: Yes Measurement:per nursing flow sheet by Bedside RN, A. Williams. Right ischial tuberosity Unstageable: 4cm x 3cm black eschar Right heel DTPI: 2cm round area of purple discoloration consistent with DTPI Left heel Unstageable: 1.5cm c 2cm area of black eschar Wound bed:As noted above Drainage (amount, consistency, odor) None, dry Periwound:intact Dressing procedure/placement/frequency:I have provided Nursing with guidance in the care of these lesions using turning and repositioning to minimize time in the supine position per house protocol, bilateral pressure redistribution heel boots and placement for a silicone foam. Topical care to the bilateral heels will be to wash daily with soap and water, rinse and dry then to paint the affected areas with a povidone-iodine swabstick and allow to air dry. When dry, the areas are to be covered with a dry gauze, then secured with a few turns of Kerlix roll gauze/paper tape. The heels are to be placed into the [employer]. The right ischial tuberosity wound is to be cleansed daily and a thin layer of collagenase (Santyl) applied. This is to be covered with a dry gauze dressing and secured with silicone foam.  [place] nursing team will not follow, but will remain available to this patient, the nursing and medical teams.  Please re-consult if needed.  Thank you for inviting [place] to participate in this patient's Plan of Care.  Maudie Flakes, MSN, RN, CNS, [place], Serita Grammes, [employer], [employer] phone:  [phone]

## 2022-12-14 NOTE — Progress Notes (Signed)
Orthopedic Surgery Progress Note   Assessment: Patient is a 89 y.o. female that underwent cephalomedullary rodding for a intertrochanteric femur fracture. Subsequently became infected and presented to the office with drainage after completing antibiotics. Now s/p repeat I&D   Plan: -Operative plans: complete -Diet: regular -Follow up intra-operative cultures (staph a) -DVT ppx: resume home plavix -ID and medicine consults, appreciate recommendations -Antibiotics: daptomycin -Weight bearing status: as tolerated -PT evaluate and treat -Pain control -Dispo: remain floor status  HTN -pressures have been lower, holding home hydralazine  Urinary retention -resume flomax that was previously prescribed -remove foley, switched to pure wick  Hypothyroidism -resume home levothyroxine  Lower lobe consolidation, possible metastatic disease -daughter, POA, does not want the CT findings discussed with the patient or further work up -continue supplemental O2 as needed to maintain sat's >92% -wean O2 as able  -encourage incentive spirometry   ___________________________________________________________________________  Subjective: No acute events overnight. Feeling better this morning. Appetite has returned. Asked for breakfast this morning. Was able to eat most of her breakfast and drink the Ensure. Not having any pain in her hip. No numbness or paresthesias.    Physical Exam:  General: no acute distress, appears stated age, thin body habitus Neurologic: alert, answering questions appropriately, following commands Respiratory: unlabored breathing on supplemental O2, symmetric chest rise  MSK:   -Left lower extremity  Dressing over hip c/d/i, distal incisions appear well healed EHL/TA/GSC intact Plantarflexes and dorsiflexes toes Sensation intact to light touch in sural, saphenous, tibial, deep peroneal, and superficial peroneal nerve distributions Foot warm and well  perfused   Patient name: Anna Weiss Patient MRN: [id number] Date: 12/14/22

## 2022-12-15 ENCOUNTER — Inpatient Hospital Stay: Payer: Self-pay

## 2022-12-15 DIAGNOSIS — T8142XD Infection following a procedure, deep incisional surgical site, subsequent encounter: Secondary | ICD-10-CM | POA: Diagnosis not present

## 2022-12-15 DIAGNOSIS — A4902 Methicillin resistant Staphylococcus aureus infection, unspecified site: Secondary | ICD-10-CM

## 2022-12-15 DIAGNOSIS — R338 Other retention of urine: Secondary | ICD-10-CM | POA: Diagnosis not present

## 2022-12-15 DIAGNOSIS — Z79899 Other long term (current) drug therapy: Secondary | ICD-10-CM | POA: Diagnosis not present

## 2022-12-15 DIAGNOSIS — I10 Essential (primary) hypertension: Secondary | ICD-10-CM | POA: Diagnosis not present

## 2022-12-15 DIAGNOSIS — T8142XA Infection following a procedure, deep incisional surgical site, initial encounter: Secondary | ICD-10-CM

## 2022-12-15 DIAGNOSIS — J9611 Chronic respiratory failure with hypoxia: Secondary | ICD-10-CM | POA: Diagnosis not present

## 2022-12-15 LAB — CK: Total CK: 20 U/L — ABNORMAL LOW (ref 38–234)

## 2022-12-15 MED ORDER — CALCIUM CARBONATE 1250 (500 CA) MG PO TABS
1.0000 | ORAL_TABLET | Freq: Every day | ORAL | Status: DC
  Administered 2022-12-15 – 2022-12-17 (×3): 1250 mg via ORAL
  Filled 2022-12-15 (×3): qty 1

## 2022-12-15 NOTE — NC FL2 (Signed)
[place] MEDICAID FL2 LEVEL OF CARE FORM     IDENTIFICATION  Patient Name: Anna Weiss Birthdate: 04/03/1925 Sex: female Admission Date (Current Location): 12/12/2022  [facility] and [place] Number:  Herbalist and Address:  [facility] [place], [place] [address] [place], [place] [address]      Provider Number: [id number]  Attending Physician Name and Address:  Callie Fielding, MD  Relative Name and Phone Number:  Ritter,Jayne Daughter [phone]    Current Level of Care: Hospital Recommended Level of Care: [place] Prior Approval Number:    Date Approved/Denied:   PASRR Number: [id number]  Discharge Plan: SNF    Current Diagnoses: Patient Active Problem List   Diagnosis Date Noted   Medication management 12/15/2022   Deep postoperative wound infection 12/15/2022   Hypotension 12/14/2022   Acute urinary retention 12/14/2022   Chronic respiratory failure with hypoxia ([place]) 12/14/2022   Nodule of right lung 12/14/2022   Pleural effusion, left 12/14/2022   Post-operative infection 12/12/2022   Surgical site infection 10/28/2022   Drainage from wound 10/10/2022   Fracture, intertrochanteric, left femur, sequela 09/28/2022   Sinus bradycardia 09/25/2022   Left lower lobe consolidation ([place]) 09/25/2022   Essential hypertension 09/25/2022   Left parietal scalp hematoma, initial encounter 09/25/2022   Displaced intertrochanteric fracture of left femur, initial encounter for closed fracture ([place]) 09/24/2022   Hip fracture ([place]) 09/24/2022   Rib fracture 04/08/2022   Lumbar compression fracture ([place]) 04/08/2022   Pain in right lower leg 05/14/2018    Orientation RESPIRATION BLADDER Height & Weight     Self, Time, Situation, Place  O2 Incontinent, External catheter Weight: 130 lb (59 kg) Height:  '5\' 7"'$  (170.2 cm)  BEHAVIORAL SYMPTOMS/MOOD NEUROLOGICAL BOWEL NUTRITION STATUS      Incontinent Diet (see discharge summary)   AMBULATORY STATUS COMMUNICATION OF NEEDS Skin   Total Care Verbally Other (Comment) (Unstageable pressure injuries to right ischial tuberosity and left heel, deep tissue pressure injury (DTPI) to right heel. Braden scare score is 13, high risk for skin breakdown.)                       Personal Care Assistance Level of Assistance  Bathing, Feeding, Dressing Bathing Assistance: Maximum assistance Feeding assistance: Limited assistance Dressing Assistance: Maximum assistance     Functional Limitations Info  Sight, Hearing, Speech Sight Info: Adequate Hearing Info: Impaired Speech Info: Adequate    SPECIAL CARE FACTORS FREQUENCY  PT (By licensed PT), OT (By licensed OT)     PT Frequency: 5x week OT Frequency: 5x week            Contractures Contractures Info: Not present    Additional Factors Info  Code Status, Allergies Code Status Info: DNR Allergies Info: Aspirin           Current Medications (12/15/2022):  This is the current hospital active medication list Current Facility-Administered Medications  Medication Dose Route Frequency Provider Last Rate Last Admin   0.9 %  sodium chloride infusion   Intravenous Continuous Vanetta Mulders, MD 100 mL/hr at 12/15/22 0047 New Bag at 12/15/22 0047   calcium carbonate (OS-CAL - dosed in mg of elemental calcium) tablet 1,250 mg  1 tablet Oral Q breakfast Callie Fielding, MD   1,250 mg at 12/15/22 0973   Chlorhexidine Gluconate Cloth 2 % PADS 6 each  6 each Topical Q0600 Callie Fielding, MD   6 each at  12/15/22 0431   clopidogrel (PLAVIX) tablet 75 mg  75 mg Oral Daily Callie Fielding, MD   75 mg at 12/15/22 9381   collagenase (SANTYL) ointment   Topical Daily Callie Fielding, MD   Given at 12/15/22 0951   famotidine (PEPCID) 40 MG/5ML suspension 40 mg  40 mg Oral BID Callie Fielding, MD   40 mg at 12/15/22 0175   feeding supplement (ENSURE ENLIVE / ENSURE PLUS) liquid 237 mL  237 mL Oral BID BM Callie Fielding, MD    237 mL at 12/15/22 0951   iron polysaccharides (NIFEREX) capsule 150 mg  150 mg Oral Daily Callie Fielding, MD   150 mg at 12/15/22 1025   levothyroxine (SYNTHROID) tablet 50 mcg  50 mcg Oral Q0600 Callie Fielding, MD   50 mcg at 12/15/22 0421   morphine (PF) 2 MG/ML injection 2-4 mg  2-4 mg Intravenous Q4H PRN Callie Fielding, MD       mupirocin ointment (BACTROBAN) 2 % 1 Application  1 Application Nasal BID Callie Fielding, MD   1 Application at 85/27/78 2423   polyethylene glycol (MIRALAX / GLYCOLAX) packet 17 g  17 g Oral Daily Callie Fielding, MD   17 g at 12/15/22 0941   potassium chloride (KLOR-CON) packet 40 mEq  40 mEq Oral Daily Callie Fielding, MD   40 mEq at 12/15/22 5361   senna (SENOKOT) tablet 8.6 mg  1 tablet Oral BID Callie Fielding, MD   8.6 mg at 12/15/22 4431   tamsulosin (FLOMAX) capsule 0.8 mg  0.8 mg Oral QPC supper Callie Fielding, MD   0.8 mg at 12/14/22 1807   vancomycin (VANCOREADY) IVPB 750 mg/150 mL  750 mg Intravenous Q24H Callie Fielding, MD 150 mL/hr at 12/15/22 0430 750 mg at 12/15/22 0430     Discharge Medications: Please see discharge summary for a list of discharge medications.  Relevant Imaging Results:  Relevant Lab Results:   Additional Information SSN: [id number]  Joanne Chars, LCSW

## 2022-12-15 NOTE — Progress Notes (Addendum)
Orthopedic Surgery Progress Note   Assessment: Patient is a 89 y.o. female that underwent cephalomedullary rodding for a intertrochanteric femur fracture. Subsequently became infected and presented to the office with drainage after completing antibiotics. Now s/p repeat I&D   Plan: -Operative plans: complete -Diet: regular -Follow up intra-operative cultures (MRSA) -DVT ppx: continue home plavix -ID and medicine consults, appreciate recommendations -Antibiotics: daptomycin -Weight bearing status: as tolerated -PT evaluate and treat -Pain control -Dispo: remain floor status  HTN -pressures have been lower, holding home hydralazine  Urinary retention -resume flomax that was previously prescribed -remove foley, switched to pure wick  Hypothyroidism -resume home levothyroxine  Chronic respiratory failure with hypoxia  -has lesions in the lungs, possible metastatic lesion -daughter, POA, does not want the CT findings discussed with the patient or further work up -continue supplemental O2 as needed to maintain sat's >92% -wean O2 as able  -encourage incentive spirometry  Pressure ulcer over bilateral heels and right ischial tuberosity -wound care consulted -will have nursing do santyl and padding to the ischial tuberosity -will have nursing keep her in boots and offload the heels   ___________________________________________________________________________  Subjective: No acute events overnight. Sleeping this morning but awakes to voice. Not having any hip pain. Denies paresthesias and numbness.    Physical Exam:  General: no acute distress, appears stated age, thin body habitus Neurologic: alert, answering questions appropriately, following commands Respiratory: unlabored breathing on supplemental O2, symmetric chest rise  MSK:   -Left lower extremity  Dressing with streak of blood but otherwise c/d/i, distal incisions appear well healed EHL/TA/GSC  intact Plantarflexes and dorsiflexes toes Sensation intact to light touch in sural, saphenous, tibial, deep peroneal, and superficial peroneal nerve distributions Foot warm and well perfused   Patient name: Anna Weiss Patient MRN: [id number] Date: 12/15/22

## 2022-12-15 NOTE — Progress Notes (Signed)
Hospitalist Consult Progress Note   Anna Weiss  ACZ:[id number]  DOB: [birth year]  DOA: 12/12/2022  PCP: Mayra Neer, MD  Requesting provider: Dr. Laurance Flatten  Reason for consultation: medical management    History of Present Illness: Anna Weiss is a 89 y.o. female with PMH HTN, sinus bradycardia, CVA, lung mass. She had recent left intertrochanteric femur fracture s/p CMN 09/2022.  She had developed a postop wound infection and underwent I&D with cultures growing MRSA.  She also was found to be mildly hypoxic during that hospitalization and required 2 L oxygen.  She had worsening of her sinus bradycardia and was taken off of propranolol and verapamil. She completed a 4-week course of daptomycin on 11/08/2022. Prior to this hospitalization, she had also been on ciprofloxacin for treatment of a UTI and completed course on 11/27/2022. On outpatient evaluation she was found to have drainage from left hip and was referred to the hospital for repeat I&D.  She underwent I&D on 12/12/2022.   Hospitalist service following for medical management.   Interval History: No events overnight.  Resting in bed when seen this morning and in no distress.   Seems to continue to be recovering.  Dapto changed to vancomycin yesterday per ID.  Assessment and Plan:  Hypotension  -BP was noted to be as low as 98/48 postoperatively.   -Pressure continues to remain stabilized and normal - All BP meds on hold  Chronic respiratory failure with hypoxia Right Lung nodules Left pleural effusion Bibasilar opacities -Appears has been on oxygen since November 2023 although low requirements - Continue trying to wean as able.  Suspect chronic component of atelectasis if mobility status remains poor; CXR also appears consistent with bibasilar atelectasis after review -Try to encourage incentive spirometer as able -CT chest from 09/24/2022 notes 2.3 cm nodule RML and 9 mm satellite nodule RML suggesting  possible metastatic disease as well; daughter aware and no further workup wished to be pursued and she also wishes for this information to not be discussed with patient - pleural effusion noted; possibly malignant; would not pursue treatment unless becomes symptomatic  -Has been weaned down to 1 L currently  Chronic bradycardia - baseline 40s-60s and appears symptomatic -No longer on propranolol or verapamil - Continue telemetry; overall remaining stable  Normocytic anemia -Suspected some postop loss - Relatively asymptomatic - Continue trending and well transfuse if necessary - Hemoglobin 7.7 g/dL 1/20 - Check hemoglobin intermittently if needed   Urinary retention - s/p foley - renal function normal and stable  - continue purewick and bladder scans for now  Postoperative wound infection, left hip Patient with prior history of wound cultures from I&D back in 10/09/2022 which was positive for MRSA.  Patient had been treated with a 4-week course of IV daptomycin completed on 12/16.   - now s/p repeat I&D on 12/12/22 - dapto changed to vanc starting 12/15/22; sensitivities pending for dapto - continue vanc as per ID and primary service  Past Medical History: Past Medical History:  Diagnosis Date   High cholesterol    Hypertension    Stroke [facility])    2003   Tachycardia     Past Surgical History: Past Surgical History:  Procedure Laterality Date   ABDOMINAL HYSTERECTOMY     I & D EXTREMITY Left 10/09/2022   Procedure: IRRIGATION AND DEBRIDEMENT LEFT HIP;  Surgeon: Callie Fielding, MD;  Location: [place];  Service: Orthopedics;  Laterality: Left;   INCISION AND DRAINAGE HIP Left 12/12/2022   Procedure: LEFT HIP WOUND IRRIGATION AND DEBRIDEMENT;  Surgeon: Callie Fielding, MD;  Location: [place];  Service: Orthopedics;  Laterality: Left;   INTRAMEDULLARY (IM) NAIL INTERTROCHANTERIC Left 09/25/2022   Procedure: INTRAMEDULLARY (IM) NAIL INTERTROCHANTERIC;  Surgeon: Callie Fielding, MD;   Location: [place];  Service: Orthopedics;  Laterality: Left;    Allergies:   Allergies  Allergen Reactions   Asa [Aspirin] Nausea Only    Social History:  reports that she has never smoked. She has never used smokeless tobacco. She reports that she does not drink alcohol and does not use drugs.  Family History: History reviewed. No pertinent family history.   Objective: Physical Exam: Vitals:   12/14/22 1933 12/15/22 0341 12/15/22 0832 12/15/22 1329  BP: (!) 125/49 (!) 148/65 (!) 140/70 (!) 143/64  Pulse: 60 (!) 59 (!) 56 63  Resp: '16 14  19  '$ Temp: 98.1 F (36.7 C) 98 F (36.7 C)  97.6 F (36.4 C)  TempSrc: Oral Oral  Oral  SpO2: 97% 93% 94% 97%  Weight:      Height:       Physical Exam Constitutional:      General: She is not in acute distress.    Appearance: Normal appearance. She is not ill-appearing.  HENT:     Head: Normocephalic and atraumatic.     Mouth/Throat:     Mouth: Mucous membranes are moist.  Eyes:     Extraocular Movements: Extraocular movements intact.  Cardiovascular:     Rate and Rhythm: Normal rate and regular rhythm.  Pulmonary:     Effort: Pulmonary effort is normal. No respiratory distress.     Breath sounds: Normal breath sounds. No wheezing.  Abdominal:     General: Bowel sounds are normal. There is no distension.     Palpations: Abdomen is soft.     Tenderness: There is no abdominal tenderness.  Musculoskeletal:     Cervical back: Normal range of motion and neck supple.     Comments: Left hip edematous but compartments soft; minimal TTP  Skin:    Comments: Scattered senile bruising  Neurological:     General: No focal deficit present.     Mental Status: She is alert.  Psychiatric:        Mood and Affect: Mood normal.     Data reviewed:  I have personally reviewed following labs and imaging studies Results for orders placed or performed during the hospital encounter of 12/12/22 (from the past 48 hour(s))  Type and screen [place]     Status: None   Collection Time: 12/13/22  4:30 PM  Result Value Ref Range   ABO/RH(D) A POS    Antibody Screen NEG    Sample Expiration 12/16/2022,2359    Unit Number [id number]    Blood Component Type RED CELLS,LR    Unit division 00    Status of Unit ISSUED,FINAL    Transfusion Status OK TO TRANSFUSE    Crossmatch Result      Compatible Performed at [facility], [address] [place], [place] [address]   Prepare RBC (crossmatch)     Status: None   Collection Time: 12/13/22  4:36 PM  Result Value Ref Range   Order Confirmation      ORDERS RECEIVED TO CROSSMATCH Performed at [facility], [address], [place], [place] [address]   Glucose,  capillary     Status: Abnormal   Collection Time: 12/13/22  4:56 PM  Result Value Ref Range   Glucose-Capillary 129 (H) 70 - 99 mg/dL    Comment: Glucose reference range applies only to samples taken after fasting for at least 8 hours.   Comment 1 Notify RN    Comment 2 Document in Chart   Procalcitonin - Baseline     Status: None   Collection Time: 12/13/22  7:17 PM  Result Value Ref Range   Procalcitonin <0.10 ng/mL    Comment:        Interpretation: PCT (Procalcitonin) <= 0.5 ng/mL: Systemic infection (sepsis) is not likely. Local bacterial infection is possible. (NOTE)       Sepsis PCT Algorithm           Lower Respiratory Tract                                      Infection PCT Algorithm    ----------------------------     ----------------------------         PCT < 0.25 ng/mL                PCT < 0.10 ng/mL          Strongly encourage             Strongly discourage   discontinuation of antibiotics    initiation of antibiotics    ----------------------------     -----------------------------       PCT 0.25 - 0.50 ng/mL            PCT 0.10 - 0.25 ng/mL               OR       >80% decrease in PCT            Discourage initiation of                                             antibiotics      Encourage discontinuation           of antibiotics    ----------------------------     -----------------------------         PCT >= 0.50 ng/mL              PCT 0.26 - 0.50 ng/mL               AND        <80% decrease in PCT             Encourage initiation of                                             antibiotics       Encourage continuation           of antibiotics    ----------------------------     -----------------------------        PCT >= 0.50 ng/mL                  PCT > 0.50 ng/mL               AND  increase in PCT                  Strongly encourage                                      initiation of antibiotics    Strongly encourage escalation           of antibiotics                                     -----------------------------                                           PCT <= 0.25 ng/mL                                                 OR                                        > 80% decrease in PCT                                      Discontinue / Do not initiate                                             antibiotics  Performed at [facility], [address], [place], [place] [address]   CBC     Status: Abnormal   Collection Time: 12/14/22 12:10 PM  Result Value Ref Range   WBC 8.3 4.0 - 10.5 K/uL   RBC 2.77 (L) 3.87 - 5.11 MIL/uL   Hemoglobin 9.1 (L) 12.0 - 15.0 g/dL   HCT 28.1 (L) 36.0 - 46.0 %   MCV 101.4 (H) 80.0 - 100.0 fL   MCH 32.9 26.0 - 34.0 pg   MCHC 32.4 30.0 - 36.0 g/dL   RDW 19.9 (H) 11.5 - 15.5 %   Platelets 297 150 - 400 K/uL   nRBC 0.0 0.0 - 0.2 %    Comment: Performed at [facility], [place] [address], [place], [place] [address]  Basic metabolic panel     Status: Abnormal   Collection Time: 12/14/22 12:10 PM  Result Value Ref Range   Sodium 136 135 - 145 mmol/L   Potassium 4.2 3.5 - 5.1 mmol/L   Chloride 104 98 - 111 mmol/L   CO2 24 22 - 32 mmol/L   Glucose, Bld 120 (H) 70 - 99 mg/dL    Comment:  Glucose reference range applies only to samples taken after fasting for at least 8 hours.   BUN 9 8 - 23 mg/dL   Creatinine, Ser 0.52 0.44 - 1.00 mg/dL   Calcium 8.1 (L) 8.9 - 10.3 mg/dL   GFR, Estimated >60 >60 mL/min  Comment: (NOTE) Calculated using the CKD-EPI Creatinine Equation (2021)    Anion gap 8 5 - 15    Comment: Performed at [facility], [place] [address], [place], [place] [address]  Sedimentation rate     Status: Abnormal   Collection Time: 12/14/22  5:58 PM  Result Value Ref Range   Sed Rate 28 (H) 0 - 22 mm/hr    Comment: Performed at [facility] [address] [place], [place] [address]  C-reactive protein     Status: Abnormal   Collection Time: 12/14/22  5:58 PM  Result Value Ref Range   CRP 7.5 (H) <1.0 mg/dL    Comment: Performed at [facility] [address], [place], [place] [address]  CK     Status: Abnormal   Collection Time: 12/15/22  5:42 AM  Result Value Ref Range   Total CK 20 (L) 38 - 234 U/L    Comment: Performed at [facility], [place] [address], [place], [place] [address]    Labs:  CBC: Recent Labs  Lab 12/12/22 1151 12/13/22 0903 12/14/22 1210  WBC 8.3 7.9 8.3  HGB 8.1* 7.7* 9.1*  HCT 26.7* 23.7* 28.1*  MCV 106.4* 102.2* 101.4*  PLT 320 309 297     Basic Metabolic Panel: Recent Labs  Lab 12/12/22 1151 12/13/22 0903 12/14/22 1210  NA 132* 138 136  K 5.0 3.9 4.2  CL 97* 101 104  CO2 '26 27 24  '$ GLUCOSE 113* 86 120*  BUN '13 9 9  '$ CREATININE 0.49 0.58 0.52  CALCIUM 8.1* 8.2* 8.1*    GFR Estimated Creatinine Clearance: 39.2 mL/min (by C-G formula based on SCr of 0.52 mg/dL). Liver Function Tests: No results for input(s): "AST", "ALT", "ALKPHOS", "BILITOT", "PROT", "ALBUMIN" in the last 168 hours. No results for input(s): "LIPASE", "AMYLASE" in the last 168 hours. No results for input(s): "AMMONIA" in the last 168 hours. Coagulation profile No results for input(s): "INR", "PROTIME" in the last 168  hours.  Cardiac Enzymes: Recent Labs  Lab 12/15/22 0542  CKTOTAL 20*   BNP: Invalid input(s): "POCBNP" CBG: Recent Labs  Lab 12/13/22 0159 12/13/22 0749 12/13/22 1111 12/13/22 1656  GLUCAP 97 97 92 129*    D-Dimer No results for input(s): "DDIMER" in the last 72 hours. Hgb A1c No results for input(s): "HGBA1C" in the last 72 hours. Lipid Profile No results for input(s): "CHOL", "HDL", "LDLCALC", "TRIG", "CHOLHDL", "LDLDIRECT" in the last 72 hours. Thyroid function studies No results for input(s): "TSH", "T4TOTAL", "T3FREE", "THYROIDAB" in the last 72 hours.  Invalid input(s): "FREET3" Anemia work up No results for input(s): "VITAMINB12", "FOLATE", "FERRITIN", "TIBC", "IRON", "RETICCTPCT" in the last 72 hours. Urinalysis    Component Value Date/Time   COLORURINE YELLOW 10/31/2022 1540   APPEARANCEUR CLOUDY (A) 10/31/2022 1540   LABSPEC 1.016 10/31/2022 1540   PHURINE 7.0 10/31/2022 1540   GLUCOSEU NEGATIVE 10/31/2022 1540   HGBUR NEGATIVE 10/31/2022 1540   BILIRUBINUR NEGATIVE 10/31/2022 1540   KETONESUR NEGATIVE 10/31/2022 1540   PROTEINUR NEGATIVE 10/31/2022 1540   UROBILINOGEN 1.0 01/02/2014 1405   NITRITE NEGATIVE 10/31/2022 1540   LEUKOCYTESUR NEGATIVE 10/31/2022 1540    Microbiology Recent Results (from the past 240 hour(s))  Surgical pcr screen     Status: Abnormal   Collection Time: 12/12/22 11:54 AM   Specimen: Nasal Mucosa; Nasal Swab  Result Value Ref Range Status   MRSA, PCR POSITIVE (A) NEGATIVE Final    Comment: RESULT CALLED  TO, READ BACK BY AND VERIFIED WITH: RN Bobbie Stack [id number] AT 1440 BY EC    Staphylococcus aureus POSITIVE (A) NEGATIVE Final    Comment: (NOTE) The Xpert SA Assay (FDA approved for NASAL specimens in patients 25 years of age and older), is one component of a comprehensive surveillance program. It is not intended to diagnose infection nor to guide or monitor treatment. Performed at [facility], [place]  [address], [place], [place] [address]   Aerobic/Anaerobic Culture w Gram Stain (surgical/deep wound)     Status: None (Preliminary result)   Collection Time: 12/12/22  7:05 PM   Specimen: Soft Tissue, Other  Result Value Ref Range Status   Specimen Description TISSUE LEFT HIP  Final   Special Requests SUPERFICAL NO 1  Final   Gram Stain   Final    MODERATE WBC PRESENT, PREDOMINANTLY PMN NO ORGANISMS SEEN    Culture   Final    RARE METHICILLIN RESISTANT STAPHYLOCOCCUS AUREUS CRITICAL RESULT CALLED TO, READ BACK BY AND VERIFIED WITH: RN ANN BATES [id number] AT 5809 BY EC NO ANAEROBES ISOLATED; CULTURE IN PROGRESS FOR 5 DAYS Sent to [place] for further susceptibility testing. Performed at [facility], [place] [address] [place], [place] [address]    Report Status PENDING  Incomplete   Organism ID, Bacteria METHICILLIN RESISTANT STAPHYLOCOCCUS AUREUS  Final      Susceptibility   Methicillin resistant staphylococcus aureus - MIC*    CIPROFLOXACIN >=8 RESISTANT Resistant     ERYTHROMYCIN >=8 RESISTANT Resistant     GENTAMICIN <=0.5 SENSITIVE Sensitive     OXACILLIN >=4 RESISTANT Resistant     TETRACYCLINE <=1 SENSITIVE Sensitive     VANCOMYCIN 1 SENSITIVE Sensitive     TRIMETH/SULFA >=320 RESISTANT Resistant     CLINDAMYCIN <=0.25 SENSITIVE Sensitive     RIFAMPIN <=0.5 SENSITIVE Sensitive     Inducible Clindamycin NEGATIVE Sensitive     LINEZOLID Value in next row Sensitive      SENSITIVE2    * RARE METHICILLIN RESISTANT STAPHYLOCOCCUS AUREUS  Aerobic/Anaerobic Culture w Gram Stain (surgical/deep wound)     Status: None (Preliminary result)   Collection Time: 12/12/22  7:12 PM   Specimen: Soft Tissue, Other  Result Value Ref Range Status   Specimen Description TISSUE LEFT HIP  Final   Special Requests SUPERFICIAL NO 2  Final   Gram Stain   Final    FEW WBC PRESENT, PREDOMINANTLY PMN NO ORGANISMS SEEN Performed at [facility], [address], [place], [place] [address]     Culture   Final    RARE METHICILLIN RESISTANT STAPHYLOCOCCUS AUREUS NO ANAEROBES ISOLATED; CULTURE IN PROGRESS FOR 5 DAYS    Report Status PENDING  Incomplete   Organism ID, Bacteria METHICILLIN RESISTANT STAPHYLOCOCCUS AUREUS  Final      Susceptibility   Methicillin resistant staphylococcus aureus - MIC*    CIPROFLOXACIN >=8 RESISTANT Resistant     ERYTHROMYCIN >=8 RESISTANT Resistant     GENTAMICIN <=0.5 SENSITIVE Sensitive     OXACILLIN >=4 RESISTANT Resistant     TETRACYCLINE <=1 SENSITIVE Sensitive     VANCOMYCIN 1 SENSITIVE Sensitive     TRIMETH/SULFA >=320 RESISTANT Resistant     CLINDAMYCIN <=0.25 SENSITIVE Sensitive     RIFAMPIN <=0.5 SENSITIVE Sensitive     Inducible Clindamycin NEGATIVE Sensitive     * RARE METHICILLIN RESISTANT STAPHYLOCOCCUS AUREUS  Aerobic/Anaerobic Culture w Gram Stain (surgical/deep wound)     Status: None (Preliminary  result)   Collection Time: 12/12/22  7:18 PM   Specimen: Soft Tissue, Other  Result Value Ref Range Status   Specimen Description TISSUE LEFT HIP  Final   Special Requests DEEP NO 1  Final   Gram Stain NO WBC SEEN NO ORGANISMS SEEN   Final   Culture   Final    NO GROWTH 3 DAYS NO ANAEROBES ISOLATED; CULTURE IN PROGRESS FOR 5 DAYS Performed at [facility], [address] [place], [place] [address]    Report Status PENDING  Incomplete  Aerobic/Anaerobic Culture w Gram Stain (surgical/deep wound)     Status: None (Preliminary result)   Collection Time: 12/12/22  7:19 PM   Specimen: Soft Tissue, Other  Result Value Ref Range Status   Specimen Description TISSUE LEFT HIP  Final   Special Requests DEEP NO 2  Final   Gram Stain NO WBC SEEN NO ORGANISMS SEEN   Final   Culture   Final    NO GROWTH 3 DAYS NO ANAEROBES ISOLATED; CULTURE IN PROGRESS FOR 5 DAYS Performed at [facility], [place] [address] [place], [place] [address]    Report Status PENDING  Incomplete    Inpatient Medications:   Scheduled Meds:  calcium  carbonate  1 tablet Oral Q breakfast   Chlorhexidine Gluconate Cloth  6 each Topical Q0600   clopidogrel  75 mg Oral Daily   collagenase   Topical Daily   famotidine  40 mg Oral BID   feeding supplement  237 mL Oral BID BM   iron polysaccharides  150 mg Oral Daily   levothyroxine  50 mcg Oral Q0600   mupirocin ointment  1 Application Nasal BID   polyethylene glycol  17 g Oral Daily   potassium chloride  40 mEq Oral Daily   senna  1 tablet Oral BID   tamsulosin  0.8 mg Oral QPC supper   PRN Meds: morphine injection Continuous Infusions:  sodium chloride 100 mL/hr at 12/15/22 0047   vancomycin 750 mg (12/15/22 0430)    Radiological Exams on Admission: DG CHEST PORT 1 VIEW  Result Date: 12/13/2022 CLINICAL DATA:  Hypoxia EXAM: PORTABLE CHEST 1 VIEW COMPARISON:  Portable exam 1504 hours compared to 10/31/2022 FINDINGS: Enlargement of cardiac silhouette. Mediastinal contours and pulmonary vascularity normal. Atherosclerotic calcification aorta. Bibasilar opacities LEFT greater than RIGHT, slightly increased on LEFT. Associated LEFT pleural effusion. Upper lungs clear. Osseous demineralization. No pneumothorax. IMPRESSION: Bibasilar opacities which could represent atelectasis or infiltrate, greater on LEFT, slightly increased on LEFT. Associated LEFT pleural effusion. Aortic Atherosclerosis (ICD10-I70.0). Electronically Signed   By: Lavonia Dana M.D.   On: 12/13/2022 16:42    Thank you for this consultation. [facility] hospitalist team will follow the patient with you.   Time spent: Greater than 50% of the 35 minute visit was spent in counseling/coordination of care for the patient as laid out in the A&P.   Dwyane Dee M.D. [facility] 12/15/2022, 3:03 PM Pager: Secure chat

## 2022-12-15 NOTE — Progress Notes (Addendum)
[facility] Infectious Diseases Follow Up Note  Patient Identification: Patient Name: Anna Weiss MRN: [id number] Admit Date: 12/12/2022 11:30 AM Age: 89 y.o.Today's Date: 12/15/2022  Reason for Visit: post op wound infection   Principal Problem:   Post-operative infection Active Problems:   Sinus bradycardia   Essential hypertension   Hypotension   Acute urinary retention   Chronic respiratory failure with hypoxia (HCC)   Nodule of right lung   Pleural effusion, left   Medication management   Deep postoperative wound infection   Antibiotics:  Daptomycin 1/19 post op-c   Lines/Hardware: ORIF left hip  Interval Events: remains afebrile,   Assessment 37 Y O female with PMH as below including (11/2) ORIF of left inter -trochanteric fracture with the use of intra-medullary device complicated by post op infection s/p (11/16) I and D of left hip ( Cx MRSA and MRSE) and completion of 4 + weeks of linezolid>daptomycin. 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 EOT 11/08/22 1/19 S/p excisional debridement to the level of muscle , fascia. OR cx MRSA  # ? Acute on chronic hypoxic respiratory failure likely 2/2 post op atelectasis?left pleural effusion On [place] Incentive spirometry  PT and OT  # Pulmonary nodules concerning for neoplasm/metastasis - Defer work up to primary.   # Multiple DU ( Unstageable pressure injuries to right ischial tuberosity and left heel, deep tissue pressure injury (DTPI) to right heel) - wound care following   Recommendations Continue Vancomycin, pharmacy to dose. Plan for 6 weeks course from 1/19 to be followed by PO suppression with PO  doxycycline for an indefinite period of time  Will not add Rifampin given her age and concerns for DDIs.  Have requested sensi of MRSA to daptomycin  PICC ordered,  ID pharmacy to place OPAT orders  Post op care per orthopedics Fu OR cultures to completion, otherwise, ID will SO.   Rest of the management as per the primary team. Thank you for the consult. Please page with pertinent questions or concerns.  ______________________________________________________________________ Subjective patient seen and examined at the bedside. Eating lunch, no complaints   Vitals BP (!) 143/64 (BP Location: Right Arm)   Pulse 63   Temp 97.6 F (36.4 C) (Oral)   Resp 19   Ht '5\' 7"'$  (1.702 m)   Wt 59 kg   SpO2 97%   BMI 20.36 kg/m     Physical Exam Constitutional:  elderly white female sitting up in the bed and appears comfortable     Comments:   Cardiovascular:     Rate and Rhythm: Normal rate and regular rhythm.     Heart sounds:   Pulmonary:     Effort: Pulmonary effort is normal on [place]    Comments:   Abdominal:     Palpations: Abdomen is soft.     Tenderness: non distended and non tender   Musculoskeletal:        General: No swelling or tenderness in peripheral joints   Skin:    Comments: multiple DU in rt hip, bilateral heels   Neurological:     General: awake, alert and oriented, following commands.   Psychiatric:  Mood and Affect: Mood normal.   Pertinent Microbiology Results for orders placed or performed during the hospital encounter of 12/12/22  Surgical pcr screen     Status: Abnormal   Collection Time: 12/12/22 11:54 AM   Specimen: Nasal Mucosa; Nasal Swab  Result Value Ref Range Status   MRSA, PCR POSITIVE (A) NEGATIVE Final    Comment: RESULT CALLED TO, READ BACK BY AND VERIFIED WITH: RN Bobbie Stack [id number] AT 1440 BY EC    Staphylococcus aureus POSITIVE (A) NEGATIVE Final    Comment: (NOTE) The Xpert SA Assay (FDA approved for NASAL specimens in  patients 45 years of age and older), is one component of a comprehensive surveillance program. It is not intended to diagnose infection nor to guide or monitor treatment. Performed at [facility], [place] [address] [place], [place] [address]   Aerobic/Anaerobic Culture w Gram Stain (surgical/deep wound)     Status: None (Preliminary result)   Collection Time: 12/12/22  7:05 PM   Specimen: Soft Tissue, Other  Result Value Ref Range Status   Specimen Description TISSUE LEFT HIP  Final   Special Requests SUPERFICAL NO 1  Final   Gram Stain   Final    MODERATE WBC PRESENT, PREDOMINANTLY PMN NO ORGANISMS SEEN    Culture   Final    RARE METHICILLIN RESISTANT STAPHYLOCOCCUS AUREUS CRITICAL RESULT CALLED TO, READ BACK BY AND VERIFIED WITH: RN ANN BATES [id number] AT 2353 BY EC NO ANAEROBES ISOLATED; CULTURE IN PROGRESS FOR 5 DAYS Sent to [place] for further susceptibility testing. Performed at [facility], [place] [address], [place], [place] [address]    Report Status PENDING  Incomplete   Organism ID, Bacteria METHICILLIN RESISTANT STAPHYLOCOCCUS AUREUS  Final      Susceptibility   Methicillin resistant staphylococcus aureus - MIC*    CIPROFLOXACIN >=8 RESISTANT Resistant     ERYTHROMYCIN >=8 RESISTANT Resistant     GENTAMICIN <=0.5 SENSITIVE Sensitive     OXACILLIN >=4 RESISTANT Resistant     TETRACYCLINE <=1 SENSITIVE Sensitive     VANCOMYCIN 1 SENSITIVE Sensitive     TRIMETH/SULFA >=320 RESISTANT Resistant     CLINDAMYCIN <=0.25 SENSITIVE Sensitive     RIFAMPIN <=0.5 SENSITIVE Sensitive     Inducible Clindamycin NEGATIVE Sensitive     LINEZOLID Value in next row Sensitive      SENSITIVE2    * RARE METHICILLIN RESISTANT STAPHYLOCOCCUS AUREUS  Aerobic/Anaerobic Culture w Gram Stain (surgical/deep wound)     Status: None (Preliminary result)   Collection Time: 12/12/22  7:12 PM   Specimen: Soft Tissue, Other  Result Value Ref Range Status   Specimen Description TISSUE  LEFT HIP  Final   Special Requests SUPERFICIAL NO 2  Final   Gram Stain   Final    FEW WBC PRESENT, PREDOMINANTLY PMN NO ORGANISMS SEEN Performed at [facility], [address], [place], [place] [address]    Culture   Final    RARE METHICILLIN RESISTANT STAPHYLOCOCCUS AUREUS NO ANAEROBES ISOLATED; CULTURE IN PROGRESS FOR 5 DAYS    Report Status PENDING  Incomplete   Organism ID, Bacteria METHICILLIN RESISTANT STAPHYLOCOCCUS AUREUS  Final      Susceptibility   Methicillin resistant staphylococcus aureus - MIC*    CIPROFLOXACIN >=8 RESISTANT Resistant     ERYTHROMYCIN >=8 RESISTANT Resistant     GENTAMICIN <=0.5 SENSITIVE Sensitive     OXACILLIN >=4 RESISTANT Resistant     TETRACYCLINE <=1 SENSITIVE Sensitive  VANCOMYCIN 1 SENSITIVE Sensitive     TRIMETH/SULFA >=320 RESISTANT Resistant     CLINDAMYCIN <=0.25 SENSITIVE Sensitive     RIFAMPIN <=0.5 SENSITIVE Sensitive     Inducible Clindamycin NEGATIVE Sensitive     * RARE METHICILLIN RESISTANT STAPHYLOCOCCUS AUREUS  Aerobic/Anaerobic Culture w Gram Stain (surgical/deep wound)     Status: None (Preliminary result)   Collection Time: 12/12/22  7:18 PM   Specimen: Soft Tissue, Other  Result Value Ref Range Status   Specimen Description TISSUE LEFT HIP  Final   Special Requests DEEP NO 1  Final   Gram Stain NO WBC SEEN NO ORGANISMS SEEN   Final   Culture   Final    NO GROWTH 3 DAYS NO ANAEROBES ISOLATED; CULTURE IN PROGRESS FOR 5 DAYS Performed at [facility], [place] [address] [place], [place] [address]    Report Status PENDING  Incomplete  Aerobic/Anaerobic Culture w Gram Stain (surgical/deep wound)     Status: None (Preliminary result)   Collection Time: 12/12/22  7:19 PM   Specimen: Soft Tissue, Other  Result Value Ref Range Status   Specimen Description TISSUE LEFT HIP  Final   Special Requests DEEP NO 2  Final   Gram Stain NO WBC SEEN NO ORGANISMS SEEN   Final   Culture   Final    NO GROWTH 3 DAYS NO  ANAEROBES ISOLATED; CULTURE IN PROGRESS FOR 5 DAYS Performed at [facility], [place] [address] [place], [place] [address]    Report Status PENDING  Incomplete   Pertinent Lab.    Latest Ref Rng & Units 12/14/2022   12:10 PM 12/13/2022    9:03 AM 12/12/2022   11:51 AM  CBC  WBC 4.0 - 10.5 K/uL 8.3  7.9  8.3   Hemoglobin 12.0 - 15.0 g/dL 9.1  7.7  8.1   Hematocrit 36.0 - 46.0 % 28.1  23.7  26.7   Platelets 150 - 400 K/uL 297  309  320       Latest Ref Rng & Units 12/14/2022   12:10 PM 12/13/2022    9:03 AM 12/12/2022   11:51 AM  CMP  Glucose 70 - 99 mg/dL 120  86  113   BUN 8 - 23 mg/dL '9  9  13   '$ Creatinine 0.44 - 1.00 mg/dL 0.52  0.58  0.49   Sodium 135 - 145 mmol/L 136  138  132   Potassium 3.5 - 5.1 mmol/L 4.2  3.9  5.0   Chloride 98 - 111 mmol/L 104  101  97   CO2 22 - 32 mmol/L '24  27  26   '$ Calcium 8.9 - 10.3 mg/dL 8.1  8.2  8.1     Pertinent Imaging today Plain films and CT images have been personally visualized and interpreted; radiology reports have been reviewed. Decision making incorporated into the Impression / Recommendations.  [place] EKG SITE RITE  Result Date: 12/15/2022 If [facility] image not attached, placement could not be confirmed due to current cardiac rhythm.   I spent 51 minutes for this patient encounter including review of prior medical records, coordination of care with primary/other specialist with greater than 50% of time being face to face/counseling and discussing diagnostics/treatment plan with the patient/family.  Electronically signed by:   Rosiland Oz, MD Infectious Disease Physician [facility] for Infectious Disease Pager: [phone]

## 2022-12-15 NOTE — Plan of Care (Signed)

## 2022-12-15 NOTE — Care Management Important Message (Signed)
Important Message  Patient Details  Name: Anna Weiss MRN: [id number] Date of Birth: 1925-05-04   Medicare Important Message Given:  Yes     Hannah Beat 12/15/2022, 3:21 PM

## 2022-12-15 NOTE — Progress Notes (Addendum)
PHARMACY CONSULT NOTE FOR:  OUTPATIENT  PARENTERAL ANTIBIOTIC THERAPY (OPAT)     Thank you for allowing pharmacy to be a part of this patient's care.  Adria Dill, PharmD PGY-2 Infectious Diseases Resident  12/16/2022 8:13 AM   Addendum: Family has decided on palliative/hospice, planning oral Doxycycline and no plans for IV antibiotics or OPAT at this time.   Thank you for allowing pharmacy to be a part of this patient's care.  Alycia Rossetti, PharmD, BCPS Infectious Diseases Clinical Pharmacist 12/16/2022 2:38 PM   **Pharmacist phone directory can now be found on [email or web address] ([email or web address]).  Listed under [place].

## 2022-12-15 NOTE — TOC Initial Note (Signed)
Transition of Care [facility]) - Initial/Assessment Note    Patient Details  Name: Anna Weiss MRN: [id number] Date of Birth: 04/05/1925  Transition of Care [facility]) CM/SW Contact:    Anna Chars, LCSW Phone Number: 12/15/2022, 10:23 AM  Clinical Narrative:    CSW spoke with pt regarding DC recommendation for SNF.  Pt is agreeable to this, requesting [facility].  Permission given to speak to daughter Anna Weiss, son Anna Weiss.  Pt reports she lives alone.  CSW spoke with daughter Anna Weiss, who added information: pt was at [facility] from early December until last Friday.  They were in copay days.  MD is recommending they not return to [facility], daughter wants to pursue other SNF, first choice would be [facility].  Pt was living home alone prior to [place], daughter was with her multiple days each week.  Daughter would like for pt to DC to daughter's residence if she has regained enough mobility after SNF.  Referral sent out for SNF, CSW reached out to [facility] to review.                Expected Discharge Plan: [place] Barriers to Discharge: Continued Medical Work up, SNF Pending bed offer   Patient Goals and CMS Choice Patient states their goals for this hospitalization and ongoing recovery are:: get home   Choice offered to / list presented to : Patient, Adult Children (daughter Anna Weiss)      Expected Discharge Plan and Services In-house Referral: Clinical Social Work   Post Acute Care Choice: [place] Living arrangements for the past 2 months: [place]                                      Prior Living Arrangements/Services Living arrangements for the past 2 months: Single Family Home Lives with:: Self Patient language and need for interpreter reviewed:: Yes Do you feel safe going back to the place where you live?: Yes      Need for Family Participation in Patient Care: Yes (Comment) Care giver support system in place?: Yes  (comment) Current home services: Other (comment) (none) Criminal Activity/Legal Involvement Pertinent to Current Situation/Hospitalization: No - Comment as needed  Activities of Daily Living Home Assistive Devices/Equipment: Environmental consultant (specify type), Wheelchair, Radio producer (specify quad or straight), Eyeglasses ADL Screening (condition at time of admission) Patient's cognitive ability adequate to safely complete daily activities?: Yes Is the patient deaf or have difficulty hearing?: No Does the patient have difficulty seeing, even when wearing glasses/contacts?: No Does the patient have difficulty concentrating, remembering, or making decisions?: No Patient able to express need for assistance with ADLs?: Yes Does the patient have difficulty dressing or bathing?: Yes Independently performs ADLs?: No Communication: Independent Dressing (OT): Needs assistance Is this a change from baseline?: Pre-admission baseline Grooming: Independent Feeding: Independent Bathing: Needs assistance Is this a change from baseline?: Pre-admission baseline Toileting: Needs assistance Is this a change from baseline?: Pre-admission baseline In/Out Bed: Needs assistance Is this a change from baseline?: Pre-admission baseline Walks in Home: Needs assistance Is this a change from baseline?: Pre-admission baseline Does the patient have difficulty walking or climbing stairs?: Yes Weakness of Legs: Both Weakness of Arms/Hands: Both  Permission Sought/Granted Permission sought to share information with : Family Supports Permission granted to share information with : Yes, Verbal Permission Granted  Share Information with NAME: daughter Anna Weiss, son Anna Weiss  Permission granted to  share info w AGENCY: SNF        Emotional Assessment Appearance:: Appears stated age Attitude/Demeanor/Rapport: Engaged Affect (typically observed): Appropriate, Pleasant Orientation: : Oriented to Self, Oriented to Place, Oriented to  Time,  Oriented to Situation      Admission diagnosis:  Post-operative infection [T81.40XA] Patient Active Problem List   Diagnosis Date Noted   Medication management 12/15/2022   Deep postoperative wound infection 12/15/2022   Hypotension 12/14/2022   Acute urinary retention 12/14/2022   Chronic respiratory failure with hypoxia ([place]) 12/14/2022   Nodule of right lung 12/14/2022   Pleural effusion, left 12/14/2022   Post-operative infection 12/12/2022   Surgical site infection 10/28/2022   Drainage from wound 10/10/2022   Fracture, intertrochanteric, left femur, sequela 09/28/2022   Sinus bradycardia 09/25/2022   Left lower lobe consolidation ([place]) 09/25/2022   Essential hypertension 09/25/2022   Left parietal scalp hematoma, initial encounter 09/25/2022   Displaced intertrochanteric fracture of left femur, initial encounter for closed fracture ([place]) 09/24/2022   Hip fracture ([place]) 09/24/2022   Rib fracture 04/08/2022   Lumbar compression fracture ([place]) 04/08/2022   Pain in right lower leg 05/14/2018   PCP:  Anna Neer, MD Pharmacy:   [place], [place] - [address] [place] [address] Phone: [phone] Fax: [phone]     Social Determinants of Health (SDOH) Social History: [place]: No Food Insecurity (09/25/2022)  Housing: Low Risk  (09/25/2022)  Transportation Needs: No Transportation Needs (09/25/2022)  Utilities: Not At Risk (09/25/2022)  Tobacco Use: Low Risk  (12/13/2022)   SDOH Interventions:     Readmission Risk Interventions     No data to display

## 2022-12-15 NOTE — Progress Notes (Signed)
This nurse removed IV from LUE due to infiltration Shift nurse notified

## 2022-12-15 NOTE — Progress Notes (Signed)
Secure chat with bedside RN regarding PICC.  Per Amina, RN "Pt's daughter said she they went through this last month where she had to have a PICC placed and no SNF would take her and they had to take it out, She would like to hold off on the Picc until she speaks with MD and social worker to get placement first".  IV team will follow up 11/23.

## 2022-12-16 DIAGNOSIS — T8142XD Infection following a procedure, deep incisional surgical site, subsequent encounter: Secondary | ICD-10-CM | POA: Diagnosis not present

## 2022-12-16 DIAGNOSIS — J9611 Chronic respiratory failure with hypoxia: Secondary | ICD-10-CM | POA: Diagnosis not present

## 2022-12-16 DIAGNOSIS — I10 Essential (primary) hypertension: Secondary | ICD-10-CM | POA: Diagnosis not present

## 2022-12-16 DIAGNOSIS — R338 Other retention of urine: Secondary | ICD-10-CM | POA: Diagnosis not present

## 2022-12-16 LAB — BASIC METABOLIC PANEL
Anion gap: 8 (ref 5–15)
BUN: 7 mg/dL — ABNORMAL LOW (ref 8–23)
CO2: 25 mmol/L (ref 22–32)
Calcium: 8.9 mg/dL (ref 8.9–10.3)
Chloride: 105 mmol/L (ref 98–111)
Creatinine, Ser: 0.52 mg/dL (ref 0.44–1.00)
GFR, Estimated: >60 mL/min (ref >60)
Glucose, Bld: 121 mg/dL — ABNORMAL HIGH (ref 70–99)
Potassium: 4.9 mmol/L (ref 3.5–5.1)
Sodium: 138 mmol/L (ref 135–145)

## 2022-12-16 LAB — CBC
HCT: 28.4 % — ABNORMAL LOW (ref 36.0–46.0)
Hemoglobin: 9.1 g/dL — ABNORMAL LOW (ref 12.0–15.0)
MCH: 32.7 pg (ref 26.0–34.0)
MCHC: 32 g/dL (ref 30.0–36.0)
MCV: 102.2 fL — ABNORMAL HIGH (ref 80.0–100.0)
Platelets: 169 10*3/uL (ref 150–400)
RBC: 2.78 MIL/uL — ABNORMAL LOW (ref 3.87–5.11)
RDW: 19.4 % — ABNORMAL HIGH (ref 11.5–15.5)
WBC: 6.9 10*3/uL (ref 4.0–10.5)
nRBC: 0 % (ref 0.0–0.2)

## 2022-12-16 MED ORDER — DOXYCYCLINE HYCLATE 100 MG PO TABS
100.0000 mg | ORAL_TABLET | Freq: Two times a day (BID) | ORAL | Status: DC
  Administered 2022-12-16 – 2022-12-17 (×3): 100 mg via ORAL
  Filled 2022-12-16 (×3): qty 1

## 2022-12-16 NOTE — TOC Progression Note (Signed)
Transition of Care [facility]) - Progression Note    Patient Details  Name: MARCENA DIAS MRN: [id number] Date of Birth: Oct 04, 1925  Transition of Care [facility]) CM/SW Contact  Joanne Chars, LCSW Phone Number: 12/16/2022, 11:29 AM  Clinical Narrative:   CSW spoke with pt daughter [name].  She is reconsidering plan and going to be meeting with MD shortly to talk about palliative consult.    Bed offers not discussed due to possible change in goals of care.  TOC will continue to follow.      Expected Discharge Plan: [place] Barriers to Discharge: Continued Medical Work up, SNF Pending bed offer  Expected Discharge Plan and Services In-house Referral: Clinical Social Work   Post Acute Care Choice: [place] Living arrangements for the past 2 months: [place] Determinants of Health (SDOH) Interventions [place]: No Food Insecurity (09/25/2022)  Housing: Low Risk  (09/25/2022)  Transportation Needs: No Transportation Needs (09/25/2022)  Utilities: Not At Risk (09/25/2022)  Tobacco Use: Low Risk  (12/13/2022)    Readmission Risk Interventions     No data to display

## 2022-12-16 NOTE — Progress Notes (Signed)
Hospitalist Consult Progress Note   Anna Weiss  EYC:[id number]  DOB: 05/09/1925  DOA: 12/12/2022  PCP: Mayra Neer, MD  Requesting provider: Dr. Laurance Flatten  Reason for consultation: medical management    History of Present Illness: Anna Weiss is a 89 y.o. female with PMH HTN, sinus bradycardia, CVA, lung mass. She had recent left intertrochanteric femur fracture s/p CMN 09/2022.  She had developed a postop wound infection and underwent I&D with cultures growing MRSA.  She also was found to be mildly hypoxic during that hospitalization and required 2 L oxygen.  She had worsening of her sinus bradycardia and was taken off of propranolol and verapamil. She completed a 4-week course of daptomycin on 11/08/2022. Prior to this hospitalization, she had also been on ciprofloxacin for treatment of a UTI and completed course on 11/27/2022. On outpatient evaluation she was found to have drainage from left hip and was referred to the hospital for repeat I&D.  She underwent I&D on 12/12/2022.   Hospitalist service following for medical management.   Interval History: No events overnight.  Resting in bed when seen this morning and in no distress.   Seems to continue to be recovering.  Still doing okay; no questions or concerns when seen this morning.   Assessment and Plan:  Hypotension  -BP was noted to be as low as 98/48 postoperatively.   -Pressure continues to remain stabilized and normal - All BP meds on hold  Chronic respiratory failure with hypoxia Right Lung nodules Left pleural effusion Bibasilar opacities -Appears has been on oxygen since November 2023 although low requirements - Continue trying to wean as able.  Suspect chronic component of atelectasis if mobility status remains poor; CXR also appears consistent with bibasilar atelectasis after review -Try to encourage incentive spirometer as able -CT chest from 09/24/2022 notes 2.3 cm nodule RML and 9 mm satellite  nodule RML suggesting possible metastatic disease as well; daughter aware and no further workup wished to be pursued and she also wishes for this information to not be discussed with patient - pleural effusion noted; possibly malignant; would not pursue treatment unless becomes symptomatic  -Has been weaned down to 1 L currently  Chronic bradycardia - baseline 40s-60s and appears symptomatic -No longer on propranolol or verapamil - Continue telemetry; overall remaining stable  Normocytic anemia -Suspected some postop loss - Relatively asymptomatic - Continue trending and will transfuse if necessary - Hemoglobin improved today, 9.1 g/dL - Check hemoglobin intermittently if needed   Urinary retention - resolved  - s/p foley - renal function normal and stable  - continue purewick and bladder scans if needed  Postoperative wound infection, left hip Patient with prior history of wound cultures from I&D back in 10/09/2022 which was positive for MRSA.  Patient had been treated with a 4-week course of IV daptomycin completed on 12/16.   - now s/p repeat I&D on 12/12/22 - dapto changed to vanc starting 12/15/22; sensitivities pending for dapto - continue vanc as per ID and primary service  Past Medical History: Past Medical History:  Diagnosis Date   High cholesterol    Hypertension    Stroke [facility])    2003   Tachycardia     Past Surgical History: Past Surgical History:  Procedure Laterality Date   ABDOMINAL HYSTERECTOMY     I & D EXTREMITY Left 10/09/2022   Procedure: IRRIGATION AND DEBRIDEMENT LEFT HIP;  Surgeon: Callie Fielding, MD;  Location: [place];  Service: Orthopedics;  Laterality: Left;   INCISION AND DRAINAGE HIP Left 12/12/2022   Procedure: LEFT HIP WOUND IRRIGATION AND DEBRIDEMENT;  Surgeon: Callie Fielding, MD;  Location: [place];  Service: Orthopedics;  Laterality: Left;   INTRAMEDULLARY (IM) NAIL INTERTROCHANTERIC Left 09/25/2022   Procedure: INTRAMEDULLARY (IM) NAIL  INTERTROCHANTERIC;  Surgeon: Callie Fielding, MD;  Location: [place];  Service: Orthopedics;  Laterality: Left;    Allergies:   Allergies  Allergen Reactions   Asa [Aspirin] Nausea Only    Social History:  reports that she has never smoked. She has never used smokeless tobacco. She reports that she does not drink alcohol and does not use drugs.  Family History: History reviewed. No pertinent family history.   Objective: Physical Exam: Vitals:   12/15/22 1329 12/15/22 2125 12/16/22 0630 12/16/22 0826  BP: (!) 143/64 127/63 118/82 103/64  Pulse: 63 65 76 68  Resp: '19 18 18   '$ Temp: 97.6 F (36.4 C)   97.7 F (36.5 C)  TempSrc: Oral   Axillary  SpO2: 97% 98%  95%  Weight:      Height:       Physical Exam Constitutional:      General: She is not in acute distress.    Appearance: Normal appearance. She is not ill-appearing.  HENT:     Head: Normocephalic and atraumatic.     Mouth/Throat:     Mouth: Mucous membranes are moist.  Eyes:     Extraocular Movements: Extraocular movements intact.  Cardiovascular:     Rate and Rhythm: Normal rate and regular rhythm.  Pulmonary:     Effort: Pulmonary effort is normal. No respiratory distress.     Breath sounds: Normal breath sounds. No wheezing.  Abdominal:     General: Bowel sounds are normal. There is no distension.     Palpations: Abdomen is soft.     Tenderness: There is no abdominal tenderness.  Musculoskeletal:     Cervical back: Normal range of motion and neck supple.     Comments: Left hip edematous but compartments soft; minimal TTP  Skin:    Comments: Scattered senile bruising  Neurological:     General: No focal deficit present.     Mental Status: She is alert.  Psychiatric:        Mood and Affect: Mood normal.     Data reviewed:  I have personally reviewed following labs and imaging studies Results for orders placed or performed during the hospital encounter of 12/12/22 (from the past 48 hour(s))  CBC      Status: Abnormal   Collection Time: 12/14/22 12:10 PM  Result Value Ref Range   WBC 8.3 4.0 - 10.5 K/uL   RBC 2.77 (L) 3.87 - 5.11 MIL/uL   Hemoglobin 9.1 (L) 12.0 - 15.0 g/dL   HCT 28.1 (L) 36.0 - 46.0 %   MCV 101.4 (H) 80.0 - 100.0 fL   MCH 32.9 26.0 - 34.0 pg   MCHC 32.4 30.0 - 36.0 g/dL   RDW 19.9 (H) 11.5 - 15.5 %   Platelets 297 150 - 400 K/uL   nRBC 0.0 0.0 - 0.2 %    Comment: Performed at [facility], [address] [place], [place] [address]  Basic metabolic panel     Status: Abnormal   Collection Time: 12/14/22 12:10 PM  Result Value Ref Range   Sodium 136 135 - 145 mmol/L  Potassium 4.2 3.5 - 5.1 mmol/L   Chloride 104 98 - 111 mmol/L   CO2 24 22 - 32 mmol/L   Glucose, Bld 120 (H) 70 - 99 mg/dL    Comment: Glucose reference range applies only to samples taken after fasting for at least 8 hours.   BUN 9 8 - 23 mg/dL   Creatinine, Ser 0.52 0.44 - 1.00 mg/dL   Calcium 8.1 (L) 8.9 - 10.3 mg/dL   GFR, Estimated >60 >60 mL/min    Comment: (NOTE) Calculated using the CKD-EPI Creatinine Equation (2021)    Anion gap 8 5 - 15    Comment: Performed at [facility] [address], , [place] [address]  Sedimentation rate     Status: Abnormal   Collection Time: 12/14/22  5:58 PM  Result Value Ref Range   Sed Rate 28 (H) 0 - 22 mm/hr    Comment: Performed at [facility] [address], [place], [place] [address]  C-reactive protein     Status: Abnormal   Collection Time: 12/14/22  5:58 PM  Result Value Ref Range   CRP 7.5 (H) <1.0 mg/dL    Comment: Performed at [facility] [address], [place], [place] [address]  CK     Status: Abnormal   Collection Time: 12/15/22  5:42 AM  Result Value Ref Range   Total CK 20 (L) 38 - 234 U/L    Comment: Performed at [facility], [place] [address], [place], [place] [address]  Basic metabolic panel     Status: Abnormal   Collection Time: 12/16/22 10:20 AM  Result Value Ref Range   Sodium 138  135 - 145 mmol/L   Potassium 4.9 3.5 - 5.1 mmol/L   Chloride 105 98 - 111 mmol/L   CO2 25 22 - 32 mmol/L   Glucose, Bld 121 (H) 70 - 99 mg/dL    Comment: Glucose reference range applies only to samples taken after fasting for at least 8 hours.   BUN 7 (L) 8 - 23 mg/dL   Creatinine, Ser 0.52 0.44 - 1.00 mg/dL   Calcium 8.9 8.9 - 10.3 mg/dL   GFR, Estimated >60 >60 mL/min    Comment: (NOTE) Calculated using the CKD-EPI Creatinine Equation (2021)    Anion gap 8 5 - 15    Comment: Performed at [facility] [address] [place], [place] [address]  CBC     Status: Abnormal   Collection Time: 12/16/22 10:20 AM  Result Value Ref Range   WBC 6.9 4.0 - 10.5 K/uL   RBC 2.78 (L) 3.87 - 5.11 MIL/uL   Hemoglobin 9.1 (L) 12.0 - 15.0 g/dL   HCT 28.4 (L) 36.0 - 46.0 %   MCV 102.2 (H) 80.0 - 100.0 fL   MCH 32.7 26.0 - 34.0 pg   MCHC 32.0 30.0 - 36.0 g/dL   RDW 19.4 (H) 11.5 - 15.5 %   Platelets 169 150 - 400 K/uL   nRBC 0.0 0.0 - 0.2 %    Comment: Performed at [facility], [place] [address], [place], [place] [address]    Labs:  CBC: Recent Labs  Lab 12/12/22 1151 12/13/22 0903 12/14/22 1210 12/16/22 1020  WBC 8.3 7.9 8.3 6.9  HGB 8.1* 7.7* 9.1* 9.1*  HCT 26.7* 23.7* 28.1* 28.4*  MCV 106.4* 102.2* 101.4* 102.2*  PLT 320 309 297 169     Basic Metabolic Panel: Recent Labs  Lab 12/12/22 1151 12/13/22 0903 12/14/22 1210 12/16/22 1020  NA 132* 138 136 138  K 5.0 3.9 4.2 4.9  CL 97* 101 104 105  CO2 '26 27 24 25  '$ GLUCOSE 113* 86 120* 121*  BUN '13 9 9 '$ 7*  CREATININE 0.49 0.58 0.52 0.52  CALCIUM 8.1* 8.2* 8.1* 8.9    GFR Estimated Creatinine Clearance: 39.2 mL/min (by C-G formula based on SCr of 0.52 mg/dL). Liver Function Tests: No results for input(s): "AST", "ALT", "ALKPHOS", "BILITOT", "PROT", "ALBUMIN" in the last 168 hours. No results for input(s): "LIPASE", "AMYLASE" in the last 168 hours. No results for input(s): "AMMONIA" in the last 168  hours. Coagulation profile No results for input(s): "INR", "PROTIME" in the last 168 hours.  Cardiac Enzymes: Recent Labs  Lab 12/15/22 0542  CKTOTAL 20*    BNP: Invalid input(s): "POCBNP" CBG: Recent Labs  Lab 12/13/22 0159 12/13/22 0749 12/13/22 1111 12/13/22 1656  GLUCAP 97 97 92 129*    D-Dimer No results for input(s): "DDIMER" in the last 72 hours. Hgb A1c No results for input(s): "HGBA1C" in the last 72 hours. Lipid Profile No results for input(s): "CHOL", "HDL", "LDLCALC", "TRIG", "CHOLHDL", "LDLDIRECT" in the last 72 hours. Thyroid function studies No results for input(s): "TSH", "T4TOTAL", "T3FREE", "THYROIDAB" in the last 72 hours.  Invalid input(s): "FREET3" Anemia work up No results for input(s): "VITAMINB12", "FOLATE", "FERRITIN", "TIBC", "IRON", "RETICCTPCT" in the last 72 hours. Urinalysis    Component Value Date/Time   COLORURINE YELLOW 10/31/2022 1540   APPEARANCEUR CLOUDY (A) 10/31/2022 1540   LABSPEC 1.016 10/31/2022 1540   PHURINE 7.0 10/31/2022 1540   GLUCOSEU NEGATIVE 10/31/2022 1540   HGBUR NEGATIVE 10/31/2022 1540   BILIRUBINUR NEGATIVE 10/31/2022 1540   KETONESUR NEGATIVE 10/31/2022 1540   PROTEINUR NEGATIVE 10/31/2022 1540   UROBILINOGEN 1.0 01/02/2014 1405   NITRITE NEGATIVE 10/31/2022 1540   LEUKOCYTESUR NEGATIVE 10/31/2022 1540    Microbiology Recent Results (from the past 240 hour(s))  Surgical pcr screen     Status: Abnormal   Collection Time: 12/12/22 11:54 AM   Specimen: Nasal Mucosa; Nasal Swab  Result Value Ref Range Status   MRSA, PCR POSITIVE (A) NEGATIVE Final    Comment: RESULT CALLED TO, READ BACK BY AND VERIFIED WITH: RN Bobbie Stack [id number] AT 1440 BY EC    Staphylococcus aureus POSITIVE (A) NEGATIVE Final    Comment: (NOTE) The Xpert SA Assay (FDA approved for NASAL specimens in patients 1 years of age and older), is one component of a comprehensive surveillance program. It is not intended to diagnose  infection nor to guide or monitor treatment. Performed at [facility], [place] [address] [place], [place] [address]   Aerobic/Anaerobic Culture w Gram Stain (surgical/deep wound)     Status: None (Preliminary result)   Collection Time: 12/12/22  7:05 PM   Specimen: Soft Tissue, Other  Result Value Ref Range Status   Specimen Description TISSUE LEFT HIP  Final   Special Requests SUPERFICAL NO 1  Final   Gram Stain   Final    MODERATE WBC PRESENT, PREDOMINANTLY PMN NO ORGANISMS SEEN    Culture   Final    RARE METHICILLIN RESISTANT STAPHYLOCOCCUS AUREUS CRITICAL RESULT CALLED TO, READ BACK BY AND VERIFIED WITH: RN ANN BATES [id number] AT 3419 BY EC NO ANAEROBES ISOLATED; CULTURE IN PROGRESS FOR 5 DAYS Sent to [facility] for further susceptibility testing. Performed at [facility], [place] [address], [place], [place] [address]  Report Status PENDING  Incomplete   Organism ID, Bacteria METHICILLIN RESISTANT STAPHYLOCOCCUS AUREUS  Final      Susceptibility   Methicillin resistant staphylococcus aureus - MIC*    CIPROFLOXACIN >=8 RESISTANT Resistant     ERYTHROMYCIN >=8 RESISTANT Resistant     GENTAMICIN <=0.5 SENSITIVE Sensitive     OXACILLIN >=4 RESISTANT Resistant     TETRACYCLINE <=1 SENSITIVE Sensitive     VANCOMYCIN 1 SENSITIVE Sensitive     TRIMETH/SULFA >=320 RESISTANT Resistant     CLINDAMYCIN <=0.25 SENSITIVE Sensitive     RIFAMPIN <=0.5 SENSITIVE Sensitive     Inducible Clindamycin NEGATIVE Sensitive     LINEZOLID Value in next row Sensitive      SENSITIVE2    * RARE METHICILLIN RESISTANT STAPHYLOCOCCUS AUREUS  Aerobic/Anaerobic Culture w Gram Stain (surgical/deep wound)     Status: None (Preliminary result)   Collection Time: 12/12/22  7:12 PM   Specimen: Soft Tissue, Other  Result Value Ref Range Status   Specimen Description TISSUE LEFT HIP  Final   Special Requests SUPERFICIAL NO 2  Final   Gram Stain   Final    FEW WBC PRESENT, PREDOMINANTLY PMN NO  ORGANISMS SEEN Performed at [facility], [address], [place], [place] [address]    Culture   Final    RARE METHICILLIN RESISTANT STAPHYLOCOCCUS AUREUS NO ANAEROBES ISOLATED; CULTURE IN PROGRESS FOR 5 DAYS    Report Status PENDING  Incomplete   Organism ID, Bacteria METHICILLIN RESISTANT STAPHYLOCOCCUS AUREUS  Final      Susceptibility   Methicillin resistant staphylococcus aureus - MIC*    CIPROFLOXACIN >=8 RESISTANT Resistant     ERYTHROMYCIN >=8 RESISTANT Resistant     GENTAMICIN <=0.5 SENSITIVE Sensitive     OXACILLIN >=4 RESISTANT Resistant     TETRACYCLINE <=1 SENSITIVE Sensitive     VANCOMYCIN 1 SENSITIVE Sensitive     TRIMETH/SULFA >=320 RESISTANT Resistant     CLINDAMYCIN <=0.25 SENSITIVE Sensitive     RIFAMPIN <=0.5 SENSITIVE Sensitive     Inducible Clindamycin NEGATIVE Sensitive     * RARE METHICILLIN RESISTANT STAPHYLOCOCCUS AUREUS  Aerobic/Anaerobic Culture w Gram Stain (surgical/deep wound)     Status: None (Preliminary result)   Collection Time: 12/12/22  7:18 PM   Specimen: Soft Tissue, Other  Result Value Ref Range Status   Specimen Description TISSUE LEFT HIP  Final   Special Requests DEEP NO 1  Final   Gram Stain NO WBC SEEN NO ORGANISMS SEEN   Final   Culture   Final    NO GROWTH 4 DAYS NO ANAEROBES ISOLATED; CULTURE IN PROGRESS FOR 5 DAYS Performed at [facility], [address], [place], [place] [address]    Report Status PENDING  Incomplete  Aerobic/Anaerobic Culture w Gram Stain (surgical/deep wound)     Status: None (Preliminary result)   Collection Time: 12/12/22  7:19 PM   Specimen: Soft Tissue, Other  Result Value Ref Range Status   Specimen Description TISSUE LEFT HIP  Final   Special Requests DEEP NO 2  Final   Gram Stain NO WBC SEEN NO ORGANISMS SEEN   Final   Culture   Final    NO GROWTH 4 DAYS NO ANAEROBES ISOLATED; CULTURE IN PROGRESS FOR 5 DAYS Performed at [facility], [place] [address], [place], [place]  [address]    Report Status PENDING  Incomplete    Inpatient Medications:   Scheduled Meds:  calcium carbonate  1 tablet Oral Q breakfast   Chlorhexidine Gluconate Cloth  6 each Topical Q0600   clopidogrel  75 mg Oral Daily   collagenase   Topical Daily   famotidine  40 mg Oral BID   feeding supplement  237 mL Oral BID BM   iron polysaccharides  150 mg Oral Daily   levothyroxine  50 mcg Oral Q0600   mupirocin ointment  1 Application Nasal BID   polyethylene glycol  17 g Oral Daily   potassium chloride  40 mEq Oral Daily   senna  1 tablet Oral BID   tamsulosin  0.8 mg Oral QPC supper   PRN Meds: morphine injection Continuous Infusions:  vancomycin 750 mg (12/16/22 0629)    Radiological Exams on Admission: [place] EKG SITE RITE  Result Date: 12/15/2022 If Site Rite image not attached, placement could not be confirmed due to current cardiac rhythm.   Thank you for this consultation. [facility] hospitalist team will follow the patient with you.   Dwyane Dee M.D. [facility] 12/16/2022, 12:09 PM Pager: Secure chat

## 2022-12-16 NOTE — Progress Notes (Addendum)
Orthopedic Surgery Progress Note   Assessment: Patient is a 89 y.o. female that underwent cephalomedullary rodding for a intertrochanteric femur fracture. Subsequently became infected and presented to the office with drainage after completing antibiotics. Now s/p repeat I&D   Plan: -Operative plans: complete -Diet: regular -Follow up intra-operative cultures (MRSA)  -will talk with POA about PICC today -DVT ppx: continue home plavix -ID and medicine consults, appreciate recommendations -Antibiotics: vancomycin -Weight bearing status: as tolerated -PT evaluate and treat -Pain control -Dispo: remain floor status  HTN -pressures have been lower, holding home hydralazine  Urinary retention -resume flomax that was previously prescribed -remove foley, switched to pure wick  Hypothyroidism -resume home levothyroxine  Chronic respiratory failure with hypoxia  -has lesions in the lungs, possible metastatic lesion -daughter, POA, does not want the CT findings discussed with the patient or further work up -continue supplemental O2 as needed to maintain sat's >92% -wean O2 as able  -encourage incentive spirometry  Pressure ulcer over bilateral heels and right ischial tuberosity, present on admission -wound care consulted -will have nursing do santyl and padding to the ischial tuberosity -will have nursing keep her in boots and offload the heels  Delirium -start delirium precautions  Chronic anemia with acute blood loss from surgery -hgb was 7.7 and got a transfusion, hgb is now 9.1 -will stop monitoring  ___________________________________________________________________________  Subjective: No acute events overnight. Remains on the floor. Was sleeping this morning. During part of the visit, thought she was at home but then other times remembered she was at the hospital. No hip pain. Denies paresthesias and numbness.    Physical Exam:  General: no acute distress, appears  stated age, thin body habitus Neurologic: alert, answering questions appropriately, following commands Respiratory: unlabored breathing on supplemental O2, symmetric chest rise  MSK:   -Left lower extremity  Dressing with streak of blood but otherwise c/d/i, distal incisions appear well healed EHL/TA/GSC intact Plantarflexes and dorsiflexes toes Sensation intact to light touch in sural, saphenous, tibial, deep peroneal, and superficial peroneal nerve distributions Foot warm and well perfused   Patient name: Anna Weiss Patient MRN: [id number] Date: 12/16/22

## 2022-12-16 NOTE — Progress Notes (Signed)
ID Brief note  Informed by Dr Laurance Flatten, that Patient as well as her daughter POA is willing for palliative consult for hospice. They do not want PICC line and IV abtx. ID will sign off. Please call with questions or concerns   Rosiland Oz, MD Infectious Disease Physician [facility] [address] [place], [place] [address] Phone: [phone]  Fax: [phone]

## 2022-12-16 NOTE — Plan of Care (Signed)
  Problem: Elimination: Goal: Will not experience complications related to bowel motility Outcome: Progressing   Problem: Nutrition: Goal: Adequate nutrition will be maintained Outcome: Not Progressing   Problem: Elimination: Goal: Will not experience complications related to urinary retention Outcome: Not Progressing

## 2022-12-16 NOTE — Progress Notes (Signed)
PT Cancellation Note  Patient Details Name: Anna Weiss MRN: [id number] DOB: 1925/08/30   Cancelled Treatment:    Reason Eval/Treat Not Completed: (P) Other (comment) (per chart review, pt POA in discussions with palliative services, will continue efforts next date per PT plan of care if medically appropriate.)   Carlene Coria 12/16/2022, 5:53 PM

## 2022-12-16 NOTE — Plan of Care (Signed)

## 2022-12-17 DIAGNOSIS — Z7189 Other specified counseling: Secondary | ICD-10-CM

## 2022-12-17 DIAGNOSIS — Z515 Encounter for palliative care: Secondary | ICD-10-CM

## 2022-12-17 DIAGNOSIS — I959 Hypotension, unspecified: Secondary | ICD-10-CM | POA: Diagnosis not present

## 2022-12-17 DIAGNOSIS — J90 Pleural effusion, not elsewhere classified: Secondary | ICD-10-CM | POA: Diagnosis not present

## 2022-12-17 DIAGNOSIS — T8142XD Infection following a procedure, deep incisional surgical site, subsequent encounter: Secondary | ICD-10-CM | POA: Diagnosis not present

## 2022-12-17 DIAGNOSIS — T8140XD Infection following a procedure, unspecified, subsequent encounter: Secondary | ICD-10-CM

## 2022-12-17 DIAGNOSIS — R918 Other nonspecific abnormal finding of lung field: Secondary | ICD-10-CM | POA: Diagnosis not present

## 2022-12-17 LAB — AEROBIC/ANAEROBIC CULTURE W GRAM STAIN (SURGICAL/DEEP WOUND)
Culture: NO GROWTH
Culture: NO GROWTH
Gram Stain: NONE SEEN
Gram Stain: NONE SEEN

## 2022-12-17 MED ORDER — ONDANSETRON HCL 4 MG/2ML IJ SOLN: 4.0000 mg | Freq: Four times a day (QID) | INTRAMUSCULAR | Status: DC | PRN

## 2022-12-17 MED ORDER — HALOPERIDOL LACTATE 2 MG/ML PO CONC: 0.5000 mg | ORAL | Status: DC | PRN

## 2022-12-17 MED ORDER — GLYCOPYRROLATE 1 MG PO TABS: 1.0000 mg | ORAL_TABLET | ORAL | Status: DC | PRN

## 2022-12-17 MED ORDER — MORPHINE SULFATE (PF) 2 MG/ML IV SOLN
2.0000 mg | INTRAVENOUS | Status: AC
  Administered 2022-12-17: 2 mg via INTRAVENOUS
  Filled 2022-12-17: qty 1

## 2022-12-17 MED ORDER — LORAZEPAM 2 MG/ML PO CONC: 1.0000 mg | ORAL | Status: DC | PRN

## 2022-12-17 MED ORDER — HALOPERIDOL 0.5 MG PO TABS: 0.5000 mg | ORAL_TABLET | ORAL | Status: DC | PRN

## 2022-12-17 MED ORDER — ONDANSETRON 4 MG PO TBDP: 4.0000 mg | ORAL_TABLET | Freq: Four times a day (QID) | ORAL | Status: DC | PRN

## 2022-12-17 MED ORDER — HALOPERIDOL LACTATE 5 MG/ML IJ SOLN
0.5000 mg | INTRAMUSCULAR | Status: DC | PRN
  Administered 2022-12-21: 0.5 mg via INTRAVENOUS
  Filled 2022-12-17: qty 1

## 2022-12-17 MED ORDER — GLYCOPYRROLATE 0.2 MG/ML IJ SOLN: 0.2000 mg | INTRAMUSCULAR | Status: DC | PRN

## 2022-12-17 MED ORDER — POLYVINYL ALCOHOL 1.4 % OP SOLN: 1.0000 [drp] | Freq: Four times a day (QID) | OPHTHALMIC | Status: DC | PRN

## 2022-12-17 MED ORDER — LORAZEPAM 1 MG PO TABS: 1.0000 mg | ORAL_TABLET | ORAL | Status: DC | PRN

## 2022-12-17 MED ORDER — MORPHINE SULFATE (PF) 2 MG/ML IV SOLN
2.0000 mg | INTRAVENOUS | Status: DC | PRN
  Filled 2022-12-17: qty 1

## 2022-12-17 MED ORDER — ACETAMINOPHEN 650 MG RE SUPP: 650.0000 mg | Freq: Four times a day (QID) | RECTAL | Status: DC | PRN

## 2022-12-17 MED ORDER — MORPHINE SULFATE (PF) 2 MG/ML IV SOLN
2.0000 mg | Freq: Four times a day (QID) | INTRAVENOUS | Status: DC
  Administered 2022-12-17 – 2022-12-20 (×12): 2 mg via INTRAVENOUS
  Filled 2022-12-17 (×11): qty 1

## 2022-12-17 MED ORDER — ACETAMINOPHEN 325 MG PO TABS: 650.0000 mg | ORAL_TABLET | Freq: Four times a day (QID) | ORAL | Status: DC | PRN

## 2022-12-17 MED ORDER — GLYCOPYRROLATE 0.2 MG/ML IJ SOLN
0.2000 mg | INTRAMUSCULAR | Status: DC | PRN
  Administered 2022-12-20: 0.2 mg via INTRAVENOUS
  Filled 2022-12-17: qty 1

## 2022-12-17 MED ORDER — LORAZEPAM 2 MG/ML IJ SOLN
1.0000 mg | INTRAMUSCULAR | Status: DC | PRN
  Administered 2022-12-17 – 2022-12-20 (×4): 1 mg via INTRAVENOUS
  Filled 2022-12-17 (×6): qty 1

## 2022-12-17 NOTE — TOC Progression Note (Signed)
Transition of Care [facility]) - Progression Note    Patient Details  Name: Anna Weiss MRN: [id number] Date of Birth: 1925-01-22  [facility] CM/SW Contact  Joanne Chars, [place] Phone Number: 12/17/2022, 10:26 AM  Clinical Narrative:   CSW informed by palliative team that family has elected comfort care and [facility].  CSW spoke with pt daughter Anna Weiss, confirmed the above. Referral made to [facility].    Expected Discharge Plan: [place] Barriers to Discharge: Continued Medical Work up, SNF Pending bed offer  Expected Discharge Plan and Services In-house Referral: Clinical Social Work   Post Acute Care Choice: [place] Living arrangements for the past 2 months: [place] Determinants of Health (SDOH) Interventions [place]: No Food Insecurity (09/25/2022)  Housing: Low Risk  (09/25/2022)  Transportation Needs: No Transportation Needs (09/25/2022)  Utilities: Not At Risk (09/25/2022)  Tobacco Use: Low Risk  (12/13/2022)    Readmission Risk Interventions     No data to display

## 2022-12-17 NOTE — Plan of Care (Signed)
  Problem: Education: Goal: Knowledge of General Education information will improve Description: Including pain rating scale, medication(s)/side effects and non-pharmacologic comfort measures 12/17/2022 2019 by Guinevere Scarlet, RN Outcome: Not Applicable 6/97/9480 1655 by Guinevere Scarlet, RN Outcome: Progressing   Problem: Health Behavior/Discharge Planning: Goal: Ability to manage health-related needs will improve 12/17/2022 2019 by Guinevere Scarlet, RN Outcome: Not Applicable 3/74/8270 [id number] by Guinevere Scarlet, RN Outcome: Progressing   Problem: Clinical Measurements: Goal: Ability to maintain clinical measurements within normal limits will improve 12/17/2022 2019 by Guinevere Scarlet, RN Outcome: Not Applicable 5/44/9201 0071 by Guinevere Scarlet, RN Outcome: Progressing  Comfort care patient

## 2022-12-17 NOTE — Progress Notes (Signed)
Orthopedic Surgery Progress Note   Assessment: Patient is a 89 y.o. female that underwent cephalomedullary rodding for a intertrochanteric femur fracture. Subsequently became infected and presented to the office with drainage after completing antibiotics. Now s/p repeat I&D   Plan: -Operative plans: complete -Diet: regular -Follow up intra-operative cultures (MRSA) -DVT ppx: continue home plavix -ID and medicine consults, appreciate recommendations -Will consult palliative medicine today -Antibiotics: doxycycline -Weight bearing status: as tolerated -PT evaluate and treat -Pain control -Dispo: remain floor status  HTN -pressures have been lower, holding home hydralazine  Urinary retention -continue flomax that was previously prescribed -foley replaced since she failed void trial  Hypothyroidism -continue home levothyroxine  Chronic respiratory failure with hypoxia  -has lesions in the lungs, possible metastatic lesion -daughter, POA, does not want the CT findings discussed with the patient or further work up -continue supplemental O2 as needed to maintain sat's >92% -wean O2 as able  -encourage incentive spirometry  Pressure ulcer over bilateral heels and right ischial tuberosity, present on admission -wound care consulted -nursing to do santyl and padding to the ischial tuberosity -nursing to keep her in boots and offload the heels  Delirium -continue delirium precautions  Chronic anemia with acute blood loss from surgery -hgb was 7.7 and got a transfusion, hgb is now 9.1 -will stop monitoring  ___________________________________________________________________________  Subjective: No acute events overnight. Remains on the floor. Still having waxing/waning levels of consciousness. Not having any hip pain.    Physical Exam:  General: no acute distress, appears stated age, thin body habitus Neurologic: alert, answering questions appropriately, following  commands Respiratory: unlabored breathing on supplemental O2, symmetric chest rise  MSK:   -Left lower extremity  Dressing with streak of blood (no change from yesterday) but otherwise c/d/i, distal incisions appear well healed EHL/TA/GSC intact Plantarflexes and dorsiflexes toes Sensation intact to light touch in sural, saphenous, tibial, deep peroneal, and superficial peroneal nerve distributions Foot warm and well perfused   Patient name: Anna Weiss Patient MRN: [id number] Date: 12/17/22

## 2022-12-17 NOTE — Consult Note (Signed)
Consultation Note Date: 12/17/2022   Weiss Name: Anna Weiss  DOB: [birth year]  MRN: [id number]  Age / Sex: 89 y.o., female  PCP: Anna Neer, MD Referring Physician: Callie Fielding, MD  Reason for Consultation:  "POA considering hospice/palliative care"  HPI/Weiss Profile: 89 y.o. female  with past medical history of dementia, remote stroke, HTN, hip fracture in November 2023 with recurrent infections, lung mass (family declined workup),  admitted on 12/12/2022 with recurrent hip infection, admitted for I&D. Post surgery is complicated by lack of ability to ambulate, she is not eating or drinking, she is short of breat (possibly related to malignant pleural effusion). Palliative consulted for above "poa considering hospice/palliative care".    Primary Decision Maker NEXT OF KIN - daughter Anna Weiss  Discussion: Chart reviewed including labs, progress notes, imaging from this and previous encounters.  Anna Weiss. She was moaning and appeared short of breath, using accessory muscles to breathe.  I called her daughter. Anna Weiss very clearly feels that Anna Weiss has "been through enough". They don't want PICC line and long term antibiotics. Her mom is suffering.  Discussed transition to full comfort measures.  Discussed transition to comfort measures only which includes stopping IV fluids, antibiotics, labs and providing symptom management for SOB, anxiety, nausea, vomiting, and other symptoms of dying.  [name] is in agreement with comfort plan of care.  She requests that her Mom be evaluated by [facility] for possible inpatient hospice placement.      SUMMARY OF RECOMMENDATIONS Transition to full comfort measures '2mg'$  morphine q6hr IV for pain and SOB '2mg'$  morphine concentrate IVq4mn prn SOB, air hunger, or pain Lorazepam '1mg'$  po, SL or IV q4 hr anxiety Haldol .'5mg'$  po, SL or IV q4 hr  PRN agitation Glycopyrrolate .'2mg'$  IV q4hr secretions Appreciate TOC assistance with hospice referral     Code Status/Advance Care Planning: DNR   Prognosis:   < 2 weeks due to infection without plans for further treatment, SOB r/t pleural effusion that may be malignant (family declines workup), not eating or drinking  Discharge Planning: Hospice facility pending evaluation and bed availability  Primary Diagnoses: Present on Admission:  Post-operative infection  Sinus bradycardia  Essential hypertension   Review of Systems  Unable to perform ROS: Mental status change    Physical Exam Vitals and nursing note reviewed.  Constitutional:      Appearance: She is ill-appearing.     Comments: somnolent  Pulmonary:     Comments: Increased rate, using accessory muscles Neurological:     Comments: nonverbal     Vital Signs: BP 132/84 (BP Location: Left Arm)   Pulse 77   Temp 97.8 F (36.6 C) (Axillary)   Resp 16   Ht '5\' 7"'$  (1.702 m)   Wt 59 kg   SpO2 100%   BMI 20.36 kg/m  Pain Scale: 0-10 POSS *See Group Information*: 1-Acceptable,Awake and alert Pain Score: 8    SpO2: SpO2: 100 % O2 Device:SpO2: 100 % O2 Flow Rate: .O2 Flow Rate (  L/min): 2 L/min  IO: Intake/output summary:  Intake/Output Summary (Last 24 hours) at 12/17/2022 1031 Last data filed at 12/17/2022 1009 Gross per 24 hour  Intake 40 ml  Output 400 ml  Net -360 ml    LBM: Last BM Date : 12/14/22 Baseline Weight: Weight: 59 kg Most recent weight: Weight: 59 kg       Thank you for this consult. Palliative medicine will continue to follow and assist as needed.   Greater than 50%  of this time was spent counseling and coordinating care related to the above assessment and plan.  Signed by: Anna Weiss, AGNP-C Palliative Medicine    Please contact Palliative Medicine Team phone at [phone] for questions and concerns.  For individual provider: See Anna Weiss

## 2022-12-17 NOTE — Progress Notes (Signed)
[facility]                                                                               Anna Weiss, is a 89 y.o. female, DOB - [birth year], SWF:[id number] Admit date - 12/12/2022    Outpatient Primary MD for the patient is Mayra Neer, MD  LOS - 5  days    Brief summary    Anna Weiss is a 89 y.o. female with PMH HTN, sinus bradycardia, CVA, lung mass. She had recent left intertrochanteric femur fracture s/p CMN 09/2022.  She had developed a postop wound infection and underwent I&D with cultures growing MRSA.  She also was found to be mildly hypoxic during that hospitalization and required 2 L oxygen.  She had worsening of her sinus bradycardia and was taken off of propranolol and verapamil. She completed a 4-week course of daptomycin on 11/08/2022. Prior to this hospitalization, she had also been on ciprofloxacin for treatment of a UTI and completed course on 11/27/2022. On outpatient evaluation she was found to have drainage from left hip and was referred to the hospital for repeat I&D.  She underwent I&D on 12/12/2022.    Hospitalist service following for medical management.  Patient continues to do poorly, with progressive decline. Palliative care consulted and she was transition to comfort measures  Assessment & Plan    Assessment and Plan:  Hypotension All blood pressure medications are on hold    Chronic respiratory failure with hypoxia from mild left pleural effusion Nasal cannula oxygen to keep her comfortable    Normocytic anemia Hemoglobin stable    Urinary retention Resolved    Chronic bradycardia Stable    Postoperative wound infection on the left hip S/p completion of 4 weeks of IV daptomycin Despite IV antibiotics and incision and drainage in November 2023 patient continues to do poorly with progressive decline.  Palliative care consulted and she was transitioned to comfort measures      RN Pressure Injury  Documentation: Pressure Injury 10/16/22 Ischial tuberosity Right Stage 2 -  Partial thickness loss of dermis presenting as a shallow open injury with a red, pink wound bed without slough. (Active)  10/16/22 0900  Location: Ischial tuberosity  Location Orientation: Right  Staging: Stage 2 -  Partial thickness loss of dermis presenting as a shallow open injury with a red, pink wound bed without slough.  Wound Description (Comments):   Present on Admission: No  Dressing Type Gauze (Comment);Santyl 12/17/22 1009     Pressure Injury 10/16/22 Right Stage 2 -  Partial thickness loss of dermis presenting as a shallow open injury with a red, pink wound bed without slough. (Active)  10/16/22 0900  Location:   Location Orientation: Right  Staging: Stage 2 -  Partial thickness loss of dermis presenting as a shallow open injury with a red, pink wound bed without slough.  Wound Description (Comments):   Present on Admission: No      Level of Care: Level of care: Palliative Care Family Communication: None at bedside   Antimicrobials:   Anti-infectives (From admission, onward)    Start     Dose/Rate Route Frequency Ordered Stop  12/16/22 1445  doxycycline (VIBRA-TABS) tablet 100 mg  Status:  Discontinued        100 mg Oral Every 12 hours 12/16/22 1359 12/17/22 1022   12/15/22 0600  vancomycin (VANCOREADY) IVPB 750 mg/150 mL  Status:  Discontinued        750 mg 150 mL/hr over 60 Minutes Intravenous Every 24 hours 12/14/22 1637 12/16/22 1359   12/14/22 0600  DAPTOmycin (CUBICIN) 600 mg in sodium chloride 0.9 % IVPB  Status:  Discontinued        600 mg 124 mL/hr over 30 Minutes Intravenous Every 24 hours 12/13/22 1705 12/14/22 1634   12/13/22 0030  DAPTOmycin (CUBICIN) 600 mg in sodium chloride 0.9 % IVPB        600 mg 124 mL/hr over 30 Minutes Intravenous  Once 12/12/22 2334 12/13/22 0459        Medications  Scheduled Meds:  Chlorhexidine Gluconate Cloth  6 each Topical Q0600    collagenase   Topical Daily    morphine injection  2 mg Intravenous Q6H   Continuous Infusions: PRN Meds:.acetaminophen **OR** acetaminophen, glycopyrrolate **OR** glycopyrrolate **OR** glycopyrrolate, haloperidol **OR** haloperidol **OR** haloperidol lactate, LORazepam **OR** LORazepam **OR** LORazepam, morphine injection, ondansetron **OR** ondansetron (ZOFRAN) IV, polyvinyl alcohol    Subjective:   Anna Weiss was seen and examined today.  Confused but appears comfortable  Objective:   Vitals:   12/16/22 0630 12/16/22 0826 12/16/22 1942 12/17/22 0820  BP: 118/82 103/64 (!) 120/102 132/84  Pulse: 76 68 73 77  Resp: '18 16 16   '$ Temp:  97.7 F (36.5 C) 97.8 F (36.6 C) 97.8 F (36.6 C)  TempSrc:  Axillary Axillary Axillary  SpO2:  95% 97% 100%  Weight:      Height:        Intake/Output Summary (Last 24 hours) at 12/17/2022 1212 Last data filed at 12/17/2022 1009 Gross per 24 hour  Intake 40 ml  Output 400 ml  Net -360 ml   Filed Weights   12/12/22 1154  Weight: 59 kg     Exam General exam: Elderly woman, ill-appearing Respiratory system: Clear to auscultation. Respiratory effort normal. Cardiovascular system: S1 & S2 heard, RRR. Marland Kitchen Gastrointestinal system: Abdomen is soft, nontender Central nervous system: Alert confused Extremities: no edema. Skin: No rashes,    Data Reviewed:  I have personally reviewed following labs and imaging studies   CBC Lab Results  Component Value Date   WBC 6.9 12/16/2022   RBC 2.78 (L) 12/16/2022   HGB 9.1 (L) 12/16/2022   HCT 28.4 (L) 12/16/2022   MCV 102.2 (H) 12/16/2022   MCH 32.7 12/16/2022   PLT 169 12/16/2022   MCHC 32.0 12/16/2022   RDW 19.4 (H) 12/16/2022   LYMPHSABS 2.0 10/31/2022   MONOABS 0.6 10/31/2022   EOSABS 0.1 10/31/2022   BASOSABS 0.1 93/26/7124     Last metabolic panel Lab Results  Component Value Date   NA 138 12/16/2022   K 4.9 12/16/2022   CL 105 12/16/2022   CO2 25 12/16/2022   BUN 7 (L)  12/16/2022   CREATININE 0.52 12/16/2022   GLUCOSE 121 (H) 12/16/2022   GFRNONAA >60 12/16/2022   GFRAA >90 01/02/2014   CALCIUM 8.9 12/16/2022   PROT 5.3 (L) 10/31/2022   ALBUMIN 2.8 (L) 10/31/2022   BILITOT 1.1 10/31/2022   ALKPHOS 88 10/31/2022   AST 25 10/31/2022   ALT 29 10/31/2022   ANIONGAP 8 12/16/2022    CBG (last 3)  No results for  input(s): "GLUCAP" in the last 72 hours.    Coagulation Profile: No results for input(s): "INR", "PROTIME" in the last 168 hours.   Radiology Studies: [place] EKG SITE RITE  Result Date: 12/15/2022 If [facility] image not attached, placement could not be confirmed due to current cardiac rhythm.      Hosie Poisson M.D. [facility] 12/17/2022, 12:12 PM  Available via Epic secure chat 7am-7pm After 7 pm, please refer to night coverage provider listed on amion.

## 2022-12-18 DIAGNOSIS — T8140XD Infection following a procedure, unspecified, subsequent encounter: Secondary | ICD-10-CM | POA: Diagnosis not present

## 2022-12-18 DIAGNOSIS — J9611 Chronic respiratory failure with hypoxia: Secondary | ICD-10-CM | POA: Diagnosis not present

## 2022-12-18 DIAGNOSIS — Z7189 Other specified counseling: Secondary | ICD-10-CM | POA: Diagnosis not present

## 2022-12-18 DIAGNOSIS — A4902 Methicillin resistant Staphylococcus aureus infection, unspecified site: Secondary | ICD-10-CM | POA: Diagnosis not present

## 2022-12-18 NOTE — Discharge Instructions (Addendum)
Orthopedic Surgery Discharge Instructions  Patient name: Anna Weiss Fracture: Left intertrochanteric femur fracture Procedure Performed: Left hip irrigation and debridement Date of Surgery: 12/12/2022 Surgeon: Ileene Rubens, MD  Activity: You are allowed to put as much weight on your leg as you would like. You can walk as much as you would like. The implant is designed to allow for full body weight on it so you do not have to worry about injuring the hip by walking or transferring on it.   Incision Care: Your incision site has a dressing over it. That dressing should remain in place and dry at all times for a total of two weeks after surgery. After two weeks, the dressing can be removed. It is okay to leave the wound open to air at that time. There are sutures underneath the dressing that can be removed on 01/02/2023. These sutures can be removed at hospice care.  Medications: '2mg'$  morphine to be given every 6 hours scheduled. '2mg'$  morphine every 15 minutes for pain or respiratory rate greater than 26. Haldol 0.'5mg'$  every 4 hours as needed for agitation or delirium. Glycopyrrolate 0.'2mg'$  every 4 hours as needed for excessive secretions. Tylenol '650mg'$  every 6 hours for pain. Zofran '4mg'$  as needed every 8 hours for nausea or emesis.   Foley care: Patient has been unable to void independently, so foley was placed. She has failed previous void trials so foley will remain in place. Usually I recommend foley be removed and replaced every 5 days, but you can follow your facility's own guidelines. Foley was last placed on 12/16/2022.  Diet: no restrictions   Wound care: Patient has bilateral heel ulcers and a sacral ulcer. Please pad these ulcers and offload the heels with offloader boots or pillows. Apply santyl daily to the sacral ulcer.   Reasons to Call the Office After Surgery: You should feel free to call the office with any concerns or questions you have in the post-operative period.  Follow Up  Appointments: Since the patient is going for hospice care, she does not need any follow up appointments. If there are concerns or you would like the patient to be seen, please call the office and I will get her an appointment for whenever she can be transported.    Office Information:  -Ileene Rubens, MD -Phone number: [phone] -Address: [address]       [place], [place] [address]

## 2022-12-18 NOTE — Discharge Summary (Signed)
Orthopedic Surgery Discharge Summary  Patient name: Anna Weiss Patient MRN: [id number] Admit date: 12/12/2022 Discharge date: 12/21/2022  Attending physician: Ileene Rubens, MD Final diagnosis: Left hip post-operative infection Findings: Purulent material at the most proximal incision over the left hip   Hospital course: Patient is a 89 y.o. female who was sustained a left intertrochanteric fracture after a ground level fall in 09/2022. The patient underwent intramedullary fixation in 75/4492 which was complicated by infection which was treated with 4 weeks of daptomycin. She came back to the office on 12/12/2022 with purulent drainage from the most proximal incision site so she was admitted to the orthopedic service with plan for irrigation and debridement later that day. She underwent planned irrigation and debridement on 12/12/2022. She admitted to the floor post-operatively. She was placed onto daptomycin after surgery. Cultures grew out MRSA for which infectious disease was consulted who recommended vancomycin. She was switched to vancomycin at that point. She was bradycardic and had lower range of normal blood pressures. The hospitalist who was co-managing recommended discontinuining her hydralazine and so she was taken off of that medication. She has a history of bradycardia so this was just monitored. Patient had come in from her SNF on 12/12/2022 with a foley. This Foley was removed, but she was unable to void independently so a new Foley was placed on 12/16/2022 which has been in place since that time. She came in on supplemental oxygen due to a pulmonary effusion and mass within her lung. Patient's daughter who is her POA did not want this mass revealed to the patient and no work up was pursued. Her oxygen requirement to maintain her sats above 92% ranged from 1 to 2L. Labs during her admission revealed chronic anemia with acute blood loss from surgery. Her hemoglobin was 7.7 so she received a  transfusion and her most recent hemoglobin was 9.1. Her hemoglobin was stable at that point so no further checks were done. Her chemistry panel showed hypocalcemia for which oral supplementation was done. On POD#4, she was noted to be delirious and seemed to have more pain. She was moaning more frequently. Patient's daughter wanted to talk to palliative care to explore that as an option for her mom. Palliative care was consulted and after their discussion, patient's daughter decided to pursue hospice care. The patient was made comfort care on POD#5. Her monitoring was stopped and labs were no longer drawn. Her antibiotic treatment was stopped as well. She was then ready for discharge and discharged to hospice.    Instructions:   Orthopedic Surgery Discharge Instructions  Patient name: Anna Weiss Fracture: Left intertrochanteric femur fracture Procedure Performed: Left hip irrigation and debridement Date of Surgery: 12/12/2022 Surgeon: Ileene Rubens, MD  Activity: You are allowed to put as much weight on your leg as you would like. You can walk as much as you would like. The implant is designed to allow for full body weight on it so you do not have to worry about injuring the hip by walking or transferring on it.   Incision Care: Your incision site has a dressing over it. That dressing should remain in place and dry at all times for a total of two weeks after surgery. After two weeks, the dressing can be removed. It is okay to leave the wound open to air at that time. There are sutures underneath the dressing that can be removed on 01/02/2023. These sutures can be removed at the SNF.  Medications: '2mg'$  morphine  to be given every 6 hours scheduled. '2mg'$  morphine every 15 minutes for pain or respiratory rate greater than 26. Haldol 0.'5mg'$  every 4 hours as needed for agitation or delirium. Glycopyrrolate 0.'2mg'$  every 4 hours as needed for excessive secretions. Tylenol '650mg'$  every 6 hours for pain. Miralax  BID for constipation. Zofran '4mg'$  every 8 hours as needed for nausea or emesis.   Foley care: Patient has been unable to void independently, so foley was placed. She has failed previous void trials so foley will remain in place. Usually I recommend foley be removed and replaced every 5 days, but you can follow your facility's own guidelines. Foley was last placed on 12/16/2022.  Diet: no restrictions   Wound care: Patient has bilateral heel ulcers and a sacral ulcer. Please pad these ulcers and offload the heels with offloader boots or pillows. Apply santyl daily to the sacral ulcer.   Reasons to Call the Office After Surgery: You should feel free to call the office with any concerns or questions you have in the post-operative period.  Follow Up Appointments: Since the patient is going for hospice care, she does not need any follow up appointments. If there are concerns or you would like the patient to be seen, please call the office and I will get her an appointment for whenever she can be transported.    Office Information:  -Ileene Rubens, MD -Phone number: [phone] -Address: [address]       [place], [place] [address]

## 2022-12-18 NOTE — Progress Notes (Signed)
[facility]                                                                               Anna Weiss, is a 89 y.o. female, DOB - 07-Dec-1924, ULA:[id number] Admit date - 12/12/2022    Outpatient Primary MD for the patient is Mayra Neer, MD  LOS - 6  days    Brief summary    Anna Weiss is a 89 y.o. female with PMH HTN, sinus bradycardia, CVA, lung mass. She had recent left intertrochanteric femur fracture s/p CMN 09/2022.  She had developed a postop wound infection and underwent I&D with cultures growing MRSA.  She also was found to be mildly hypoxic during that hospitalization and required 2 L oxygen.  She had worsening of her sinus bradycardia and was taken off of propranolol and verapamil. She completed a 4-week course of daptomycin on 11/08/2022. Prior to this hospitalization, she had also been on ciprofloxacin for treatment of a UTI and completed course on 11/27/2022. On outpatient evaluation she was found to have drainage from left hip and was referred to the hospital for repeat I&D.  She underwent I&D on 12/12/2022.    Hospitalist service following for medical management.  Patient continues to do poorly, with progressive decline. Palliative care consulted and she was transition to comfort measures. Plan for residential hospice placement.   Assessment & Plan    Assessment and Plan:  Hypotension All blood pressure medications are on hold    Chronic respiratory failure with hypoxia from mild left pleural effusion Nasal cannula oxygen to keep her comfortable    Normocytic anemia Hemoglobin stable    Urinary retention Resolved    Chronic bradycardia Stable    Postoperative wound infection on the left hip S/p completion of 4 weeks of IV daptomycin Despite IV antibiotics and incision and drainage in November 2023 patient continues to do poorly with progressive decline.  Palliative care consulted and she was transitioned to comfort measures       RN Pressure Injury Documentation: Pressure Injury 10/16/22 Ischial tuberosity Right Stage 2 -  Partial thickness loss of dermis presenting as a shallow open injury with a red, pink wound bed without slough. (Active)  10/16/22 0900  Location: Ischial tuberosity  Location Orientation: Right  Staging: Stage 2 -  Partial thickness loss of dermis presenting as a shallow open injury with a red, pink wound bed without slough.  Wound Description (Comments):   Present on Admission: No  Dressing Type Santyl;Gauze (Comment) 12/18/22 0807     Pressure Injury 10/16/22 Right Stage 2 -  Partial thickness loss of dermis presenting as a shallow open injury with a red, pink wound bed without slough. (Active)  10/16/22 0900  Location:   Location Orientation: Right  Staging: Stage 2 -  Partial thickness loss of dermis presenting as a shallow open injury with a red, pink wound bed without slough.  Wound Description (Comments):   Present on Admission: No      Level of Care: Level of care: Palliative Care Family Communication: None at bedside   Antimicrobials:   Anti-infectives (From admission, onward)    Start     Dose/Rate  Route Frequency Ordered Stop   12/16/22 1445  doxycycline (VIBRA-TABS) tablet 100 mg  Status:  Discontinued        100 mg Oral Every 12 hours 12/16/22 1359 12/17/22 1022   12/15/22 0600  vancomycin (VANCOREADY) IVPB 750 mg/150 mL  Status:  Discontinued        750 mg 150 mL/hr over 60 Minutes Intravenous Every 24 hours 12/14/22 1637 12/16/22 1359   12/14/22 0600  DAPTOmycin (CUBICIN) 600 mg in sodium chloride 0.9 % IVPB  Status:  Discontinued        600 mg 124 mL/hr over 30 Minutes Intravenous Every 24 hours 12/13/22 1705 12/14/22 1634   12/13/22 0030  DAPTOmycin (CUBICIN) 600 mg in sodium chloride 0.9 % IVPB        600 mg 124 mL/hr over 30 Minutes Intravenous  Once 12/12/22 2334 12/13/22 0459        Medications  Scheduled Meds:  collagenase   Topical Daily     morphine injection  2 mg Intravenous Q6H   Continuous Infusions: PRN Meds:.acetaminophen **OR** acetaminophen, glycopyrrolate **OR** glycopyrrolate **OR** glycopyrrolate, haloperidol **OR** haloperidol **OR** haloperidol lactate, LORazepam **OR** LORazepam **OR** LORazepam, morphine injection, ondansetron **OR** ondansetron (ZOFRAN) IV, polyvinyl alcohol    Subjective:   Linda Macauley was seen and examined today. Comfortable.   Objective:   Vitals:   12/16/22 1942 12/17/22 0820 12/17/22 1948 12/18/22 0807  BP: (!) 120/102 132/84 (!) 131/95 126/72  Pulse: 73 77 64 72  Resp: '16  14 16  '$ Temp: 97.8 F (36.6 C) 97.8 F (36.6 C) (!) 97.5 F (36.4 C) 98 F (36.7 C)  TempSrc: Axillary Axillary Oral   SpO2: 97% 100%  100%  Weight:      Height:        Intake/Output Summary (Last 24 hours) at 12/18/2022 1407 Last data filed at 12/18/2022 1100 Gross per 24 hour  Intake 40 ml  Output 350 ml  Net -310 ml    Filed Weights   12/12/22 1154  Weight: 59 kg       Data Reviewed:  I have personally reviewed following labs and imaging studies   CBC Lab Results  Component Value Date   WBC 6.9 12/16/2022   RBC 2.78 (L) 12/16/2022   HGB 9.1 (L) 12/16/2022   HCT 28.4 (L) 12/16/2022   MCV 102.2 (H) 12/16/2022   MCH 32.7 12/16/2022   PLT 169 12/16/2022   MCHC 32.0 12/16/2022   RDW 19.4 (H) 12/16/2022   LYMPHSABS 2.0 10/31/2022   MONOABS 0.6 10/31/2022   EOSABS 0.1 10/31/2022   BASOSABS 0.1 64/40/3474     Last metabolic panel Lab Results  Component Value Date   NA 138 12/16/2022   K 4.9 12/16/2022   CL 105 12/16/2022   CO2 25 12/16/2022   BUN 7 (L) 12/16/2022   CREATININE 0.52 12/16/2022   GLUCOSE 121 (H) 12/16/2022   GFRNONAA >60 12/16/2022   GFRAA >90 01/02/2014   CALCIUM 8.9 12/16/2022   PROT 5.3 (L) 10/31/2022   ALBUMIN 2.8 (L) 10/31/2022   BILITOT 1.1 10/31/2022   ALKPHOS 88 10/31/2022   AST 25 10/31/2022   ALT 29 10/31/2022   ANIONGAP 8 12/16/2022    CBG  (last 3)  No results for input(s): "GLUCAP" in the last 72 hours.    Coagulation Profile: No results for input(s): "INR", "PROTIME" in the last 168 hours.   Radiology Studies: No results found.     Hosie Poisson M.D. [facility] 12/18/2022, 2:07  PM  Available via Epic secure chat 7am-7pm After 7 pm, please refer to night coverage provider listed on amion.

## 2022-12-18 NOTE — Progress Notes (Signed)
Orthopedic Surgery Progress Note   Assessment: Patient is a 89 y.o. female that underwent cephalomedullary rodding for a intertrochanteric femur fracture. Subsequently became infected and presented to the office with drainage after completing antibiotics. Now s/p repeat I&D   Plan: -Operative plans: complete -Diet: regular -Follow up intra-operative cultures (MRSA) -Palliative medicine following, appreciate -Patient is comfort care -Weight bearing status: as tolerated -PT evaluate and treat -Pain control -Dispo: hospice when bed available  HTN -pressures have been lower, holding home hydralazine  Urinary retention -foley replaced (1/23) since she failed void trial, will remove and replace after 5 days  Hypothyroidism -continue home levothyroxine  Chronic respiratory failure with hypoxia  -has lesions in the lungs, possible metastatic lesion -daughter, POA, does not want the CT findings discussed with the patient or further work up -discontinue O2  Pressure ulcer over bilateral heels and right ischial tuberosity, present on admission -wound care consulted -nursing to do santyl and padding to the ischial tuberosity -nursing to keep her in boots and offload the heels  Delirium -continue delirium precautions  Chronic anemia with acute blood loss from surgery -hgb was 7.7 and got a transfusion, hgb is now 9.1 -will stop monitoring  ___________________________________________________________________________  Subjective: No acute events overnight. Patient made comfort care yesterday. Not answering questions this morning. No further subjective as a result.    Physical Exam:  General: no acute distress, appears stated age, thin body habitus Neurologic: awake, not answering questions, not following commands Respiratory: unlabored breathing on supplemental O2, symmetric chest rise  MSK:   -Left lower extremity  Dressing with streak of blood (no change from yesterday) but  otherwise c/d/i, distal incisions appear well healed EHL/TA/GSC intact Plantarflexes and dorsiflexes toes to stimulation Responds to light touch in sural, saphenous, tibial, deep peroneal, and superficial peroneal nerve distributions Foot warm and well perfused   Patient name: Anna Weiss Patient MRN: [id number] Date: 12/18/22

## 2022-12-18 NOTE — TOC Progression Note (Signed)
Transition of Care [facility]) - Progression Note    Patient Details  Name: MELEA PREZIOSO MRN: [id number] Date of Birth: 01/10/1925  [facility]) CM/SW Contact  Joanne Chars, LCSW Phone Number: 12/18/2022, 11:40 AM  Clinical Narrative:    TC [facility].  Pt was not worked up yesterday, she will meet with family this AM and call with information.   Expected Discharge Plan: [place] Barriers to Discharge: Continued Medical Work up, SNF Pending bed offer  Expected Discharge Plan and Services In-house Referral: Clinical Social Work   Post Acute Care Choice: [place] Living arrangements for the past 2 months: [place] Determinants of Health (SDOH) Interventions [place]: No Food Insecurity (09/25/2022)  Housing: Low Risk  (09/25/2022)  Transportation Needs: No Transportation Needs (09/25/2022)  Utilities: Not At Risk (09/25/2022)  Tobacco Use: Low Risk  (12/13/2022)    Readmission Risk Interventions     No data to display

## 2022-12-18 NOTE — Progress Notes (Signed)
Daily Progress Note   Patient Name: Anna Weiss       Date: 12/18/2022 DOB: [birth year]  Age: 89 y.o. MRN#: [id number] Attending Physician: Callie Fielding, MD Primary Care Physician: Mayra Neer, MD Admit Date: 12/12/2022  Reason for Consultation/Follow-up: Establishing goals of care  Patient Profile/HPI:  89 y.o. female  with past medical history of dementia, remote stroke, HTN, hip fracture in November 2023 with recurrent infections, lung mass (family declined workup),  admitted on 12/12/2022 with recurrent hip infection, admitted for I&D. Post surgery is complicated by lack of ability to ambulate, she is not eating or drinking, she is short of breat (possibly related to malignant pleural effusion). Palliative consulted for above "poa considering hospice/palliative care".     Subjective: Chart reviewed including labs, progress notes, imaging from this and previous encounters.  Medication use reviewed.  Judea appears comfortable on my evaluation. Respirations are even and unlabored. She opens eyes to mouth care. She continues not to eat or drink.  Called patient's daughter for followup. Eila was restless when [name] arrived at bedside but calmed with medication administration.    Review of Systems  Unable to perform ROS: Mental status change     Physical Exam Vitals and nursing note reviewed.  Constitutional:      Appearance: She is ill-appearing.  Cardiovascular:     Rate and Rhythm: Normal rate.  Pulmonary:     Effort: Pulmonary effort is normal.  Neurological:     Mental Status: She is disoriented.             Vital Signs: BP 126/72 (BP Location: Right Arm)   Pulse 72   Temp 98 F (36.7 C)   Resp 16   Ht '5\' 7"'$  (1.702 m)   Wt 59 kg   SpO2 100%   BMI 20.36 kg/m   SpO2: SpO2: 100 % O2 Device: O2 Device: Nasal Cannula O2 Flow Rate: O2 Flow Rate (L/min): 2 L/min  Intake/output summary:  Intake/Output Summary (Last 24 hours) at 12/18/2022 1232 Last data filed at 12/18/2022 1100 Gross per 24 hour  Intake 40 ml  Output 350 ml  Net -310 ml   LBM: Last BM Date : 12/14/22 Baseline Weight: Weight: 59 kg Most recent weight: Weight: 59 kg       Palliative Assessment/Data: PPS: 10%  Patient Active Problem List   Diagnosis Date Noted   Medication management 12/15/2022   Deep postoperative wound infection 12/15/2022   MRSA infection 12/15/2022   Hypotension 12/14/2022   Acute urinary retention 12/14/2022   Chronic respiratory failure with hypoxia (HCC) 12/14/2022   Nodule of right lung 12/14/2022   Pleural effusion, left 12/14/2022   Post-operative infection 12/12/2022   Surgical site infection 10/28/2022   Drainage from wound 10/10/2022   Fracture, intertrochanteric, left femur, sequela 09/28/2022   Sinus bradycardia 09/25/2022   Left lower lobe consolidation ([place]) 09/25/2022   Essential hypertension 09/25/2022   Left parietal scalp hematoma, initial encounter 09/25/2022   Displaced intertrochanteric fracture of left femur, initial encounter for closed fracture ([place]) 09/24/2022   Hip fracture ([place]) 09/24/2022   Rib fracture 04/08/2022   Lumbar compression fracture ([place]) 04/08/2022   Pain in right lower leg 05/14/2018    Palliative Care Assessment & Plan    Assessment/Recommendations/Plan  Continue current comfort measure only interventions- morphine '2mg'$  IV q6hrs as well as morphine '2mg'$  IV q15 min prn; lorazepam for anxiety, haldol for terminal agitation, robinul for secretions Awaiting evaluation by hospice for possible inpatient hospice bed   Code Status: DNR  Prognosis:  < 2 weeks  Discharge Planning: To Be Determined  Care plan was discussed with patient's daughter and care team.   Thank you for allowing the  Palliative Medicine Team to assist in the care of this patient.   Greater than 50%  of this time was spent counseling and coordinating care related to the above assessment and plan.  Mariana Kaufman, AGNP-C Palliative Medicine   Please contact Palliative Medicine Team phone at [phone] for questions and concerns.

## 2022-12-18 NOTE — Plan of Care (Signed)
  Problem: Clinical Measurements: Goal: Will remain free from infection Outcome: Not Applicable Goal: Diagnostic test results will improve Outcome: Not Applicable Goal: Respiratory complications will improve Outcome: Not Applicable  Comfort care

## 2022-12-19 NOTE — Progress Notes (Signed)
[facility]                                                                               Anna Weiss, is a 89 y.o. female, DOB - [birth year], EVO:[id number] Admit date - 12/12/2022    Outpatient Primary MD for the patient is Mayra Neer, MD  LOS - 7  days    Brief summary    Anna Weiss is a 89 y.o. female with PMH HTN, sinus bradycardia, CVA, lung mass. She had recent left intertrochanteric femur fracture s/p CMN 09/2022.  She had developed a postop wound infection and underwent I&D with cultures growing MRSA.  She also was found to be mildly hypoxic during that hospitalization and required 2 L oxygen.  She had worsening of her sinus bradycardia and was taken off of propranolol and verapamil. She completed a 4-week course of daptomycin on 11/08/2022. Prior to this hospitalization, she had also been on ciprofloxacin for treatment of a UTI and completed course on 11/27/2022. On outpatient evaluation she was found to have drainage from left hip and was referred to the hospital for repeat I&D.  She underwent I&D on 12/12/2022.    Hospitalist service following for medical management.  Patient continues to do poorly, with progressive decline. Palliative care consulted and she was transition to comfort measures. Plan for residential hospice placement.  TRH will sign off.   Assessment & Plan    Assessment and Plan:  Hypotension All blood pressure medications are on hold    Chronic respiratory failure with hypoxia from mild left pleural effusion Nasal cannula oxygen to keep her comfortable    Normocytic anemia Hemoglobin stable    Urinary retention Resolved    Chronic bradycardia Stable    Postoperative wound infection on the left hip S/p completion of 4 weeks of IV daptomycin Despite IV antibiotics and incision and drainage in November 2023 patient continues to do poorly with progressive decline.  Palliative care consulted and she was transitioned to  comfort measures      RN Pressure Injury Documentation: Pressure Injury 10/16/22 Ischial tuberosity Right Stage 2 -  Partial thickness loss of dermis presenting as a shallow open injury with a red, pink wound bed without slough. (Active)  10/16/22 0900  Location: Ischial tuberosity  Location Orientation: Right  Staging: Stage 2 -  Partial thickness loss of dermis presenting as a shallow open injury with a red, pink wound bed without slough.  Wound Description (Comments):   Present on Admission: No  Dressing Type Santyl;Gauze (Comment) 12/18/22 2040     Pressure Injury 10/16/22 Right Stage 2 -  Partial thickness loss of dermis presenting as a shallow open injury with a red, pink wound bed without slough. (Active)  10/16/22 0900  Location:   Location Orientation: Right  Staging: Stage 2 -  Partial thickness loss of dermis presenting as a shallow open injury with a red, pink wound bed without slough.  Wound Description (Comments):   Present on Admission: No      Level of Care: Level of care: Palliative Care Family Communication: None at bedside   Antimicrobials:   Anti-infectives (From admission, onward)    Start  Dose/Rate Route Frequency Ordered Stop   12/16/22 1445  doxycycline (VIBRA-TABS) tablet 100 mg  Status:  Discontinued        100 mg Oral Every 12 hours 12/16/22 1359 12/17/22 1022   12/15/22 0600  vancomycin (VANCOREADY) IVPB 750 mg/150 mL  Status:  Discontinued        750 mg 150 mL/hr over 60 Minutes Intravenous Every 24 hours 12/14/22 1637 12/16/22 1359   12/14/22 0600  DAPTOmycin (CUBICIN) 600 mg in sodium chloride 0.9 % IVPB  Status:  Discontinued        600 mg 124 mL/hr over 30 Minutes Intravenous Every 24 hours 12/13/22 1705 12/14/22 1634   12/13/22 0030  DAPTOmycin (CUBICIN) 600 mg in sodium chloride 0.9 % IVPB        600 mg 124 mL/hr over 30 Minutes Intravenous  Once 12/12/22 2334 12/13/22 0459        Medications  Scheduled Meds:  collagenase    Topical Daily    morphine injection  2 mg Intravenous Q6H   Continuous Infusions: PRN Meds:.acetaminophen **OR** acetaminophen, glycopyrrolate **OR** glycopyrrolate **OR** glycopyrrolate, haloperidol **OR** haloperidol **OR** haloperidol lactate, LORazepam **OR** LORazepam **OR** LORazepam, morphine injection, ondansetron **OR** ondansetron (ZOFRAN) IV, polyvinyl alcohol    Subjective:   Anna Weiss was seen and examined today. Comfortable.   Objective:   Vitals:   12/17/22 1948 12/18/22 0807 12/18/22 2110 12/19/22 0836  BP: (!) 131/95 126/72 123/65 122/67  Pulse: 64 72 (!) 55 (!) 59  Resp: 14 16 (!) 9 18  Temp: (!) 97.5 F (36.4 C) 98 F (36.7 C) (!) 97.4 F (36.3 C) 98 F (36.7 C)  TempSrc: Oral     SpO2:  100% 100%   Weight:      Height:        Intake/Output Summary (Last 24 hours) at 12/19/2022 1125 Last data filed at 12/18/2022 1818 Gross per 24 hour  Intake --  Output 0 ml  Net 0 ml    Filed Weights   12/12/22 1154  Weight: 59 kg   On exam patient appears comfortable.     Data Reviewed:  I have personally reviewed following labs and imaging studies   CBC Lab Results  Component Value Date   WBC 6.9 12/16/2022   RBC 2.78 (L) 12/16/2022   HGB 9.1 (L) 12/16/2022   HCT 28.4 (L) 12/16/2022   MCV 102.2 (H) 12/16/2022   MCH 32.7 12/16/2022   PLT 169 12/16/2022   MCHC 32.0 12/16/2022   RDW 19.4 (H) 12/16/2022   LYMPHSABS 2.0 10/31/2022   MONOABS 0.6 10/31/2022   EOSABS 0.1 10/31/2022   BASOSABS 0.1 14/97/0263     Last metabolic panel Lab Results  Component Value Date   NA 138 12/16/2022   K 4.9 12/16/2022   CL 105 12/16/2022   CO2 25 12/16/2022   BUN 7 (L) 12/16/2022   CREATININE 0.52 12/16/2022   GLUCOSE 121 (H) 12/16/2022   GFRNONAA >60 12/16/2022   GFRAA >90 01/02/2014   CALCIUM 8.9 12/16/2022   PROT 5.3 (L) 10/31/2022   ALBUMIN 2.8 (L) 10/31/2022   BILITOT 1.1 10/31/2022   ALKPHOS 88 10/31/2022   AST 25 10/31/2022   ALT 29  10/31/2022   ANIONGAP 8 12/16/2022    CBG (last 3)  No results for input(s): "GLUCAP" in the last 72 hours.    Coagulation Profile: No results for input(s): "INR", "PROTIME" in the last 168 hours.   Radiology Studies: No results found.  Hosie Poisson M.D. [facility] 12/19/2022, 11:25 AM  Available via Epic secure chat 7am-7pm After 7 pm, please refer to night coverage provider listed on amion.

## 2022-12-19 NOTE — Care Management Important Message (Signed)
Important Message  Patient Details  Name: Anna Weiss MRN: [id number] Date of Birth: 1925-11-14   Medicare Important Message Given:  Yes     Hannah Beat 12/19/2022, 2:01 PM

## 2022-12-19 NOTE — TOC Progression Note (Signed)
Transition of Care [facility]) - Progression Note    Patient Details  Name: Anna Weiss MRN: [id number] Date of Birth: [birth year]  [facility]) CM/SW Contact  Sharin Mons, RN Phone Number: 12/19/2022, 11:15 AM  Clinical Narrative:     NCM made aware by [facility] with [facility] approval received for residential hospice care. Awaiting bed availability.  TOC team will continue to monitor and assist with needs....  Expected Discharge Plan: [place] Barriers to Discharge: Hospice Bed not available  Expected Discharge Plan and Services In-house Referral: Clinical Social Work   Post Acute Care Choice: [place] Living arrangements for the past 2 months: [place] Determinants of Health (SDOH) Interventions [place]: No Food Insecurity (09/25/2022)  Housing: Low Risk  (09/25/2022)  Transportation Needs: No Transportation Needs (09/25/2022)  Utilities: Not At Risk (09/25/2022)  Tobacco Use: Low Risk  (12/13/2022)    Readmission Risk Interventions     No data to display

## 2022-12-19 NOTE — Progress Notes (Signed)
Orthopedic Surgery Progress Note   Assessment: Patient is a 89 y.o. female that underwent cephalomedullary rodding for a intertrochanteric femur fracture. Subsequently became infected and presented to the office with drainage after completing antibiotics. Now s/p repeat I&D   Plan: -Operative plans: complete -Diet: regular -Follow up intra-operative cultures (MRSA) -Palliative medicine following, appreciate -Patient is comfort care -Weight bearing status: as tolerated -PT evaluate and treat -Pain control -Dispo: hospice when bed available  HTN -pressures have been lower, holding home hydralazine  Urinary retention -foley replaced (1/23) since she failed void trial  Hypothyroidism -continue home levothyroxine  Chronic respiratory failure with hypoxia  -has lesions in the lungs, possible metastatic lesion -daughter, POA, does not want the CT findings discussed with the patient or further work up -keep her on 2L of O2  Pressure ulcer over bilateral heels and right ischial tuberosity, present on admission -wound care consulted -nursing to do santyl and padding to the ischial tuberosity -nursing to keep her in boots and offload the heels  Delirium -continue delirium precautions  Chronic anemia with acute blood loss from surgery -hgb was 7.7 and got a transfusion, hgb is now 9.1 -will stop monitoring  ___________________________________________________________________________  Subjective: No acute events overnight. Remains on the floor. Not answering questions   Physical Exam:  General: no acute distress, appears stated age, thin body habitus, resting comfortably Neurologic: sleeping, not answering questions, not following commands Respiratory: unlabored breathing on supplemental O2, symmetric chest rise  MSK:   -Left lower extremity  Dressing with streak of blood (similar to yesterday) but otherwise c/d/i, distal incisions appear well healed Foot warm and well  perfused   Patient name: Anna Weiss Patient MRN: [id number] Date: 12/19/22

## 2022-12-19 NOTE — Progress Notes (Signed)
Bedside symptom management evaluation.  Patient in bed, appears asleep, no respiratory distress noted, VS at baseline.  Has received MS '2mg'$  IV q 6hrs today, respiratory rate at 12.  Does not appear to need additional RX support at this time.    Kizzie Fantasia, MSN, RN-Bc, [facility], [facility]-C Palliative Clinical Specialist [facility]

## 2022-12-20 DIAGNOSIS — T8140XD Infection following a procedure, unspecified, subsequent encounter: Secondary | ICD-10-CM | POA: Diagnosis not present

## 2022-12-20 DIAGNOSIS — A4902 Methicillin resistant Staphylococcus aureus infection, unspecified site: Secondary | ICD-10-CM | POA: Diagnosis not present

## 2022-12-20 DIAGNOSIS — Z515 Encounter for palliative care: Secondary | ICD-10-CM | POA: Diagnosis not present

## 2022-12-20 LAB — MIC RESULT

## 2022-12-20 LAB — MINIMUM INHIBITORY CONC. (1 DRUG)

## 2022-12-20 MED ORDER — MORPHINE SULFATE (PF) 2 MG/ML IV SOLN
2.0000 mg | INTRAVENOUS | Status: DC
  Administered 2022-12-20 – 2022-12-21 (×5): 2 mg via INTRAVENOUS
  Filled 2022-12-20 (×5): qty 1

## 2022-12-20 NOTE — Progress Notes (Signed)
Orthopedic Surgery Progress Note   Assessment: Patient is a 89 y.o. female that underwent cephalomedullary rodding for a intertrochanteric femur fracture. Subsequently became infected and presented to the office with drainage after completing antibiotics. Now s/p repeat I&D   Plan: -Operative plans: complete -Diet: regular -Follow up intra-operative cultures (MRSA) -Palliative medicine following, appreciate -Patient is comfort care -Weight bearing status: as tolerated -PT evaluate and treat -Pain control -Dispo: hospice when bed available  HTN -continuing to hold home hydralazine  Urinary retention -foley replaced (1/23) since she failed void trial  Hypothyroidism -holding home levothyroxine as she is comfort care  Chronic respiratory failure with hypoxia  -has lesions in the lungs, possible metastatic lesion -daughter, POA, does not want the CT findings discussed with the patient or further work up -keep her on 2L of O2  Pressure ulcer over bilateral heels and right ischial tuberosity, present on admission -wound care consulted -nursing to do santyl and padding to the ischial tuberosity -nursing to keep her in boots and offload the heels  Chronic anemia with acute blood loss from surgery -hgb was 7.7 and got a transfusion, hgb is now 9.1 -will stop monitoring  ___________________________________________________________________________  Subjective: No acute events overnight. Remains on the floor. Sleeping this morning, appears comfortable.    Physical Exam:  General: no acute distress, thin body habitus, resting comfortably Neurologic: sleeping, not answering questions, not following commands Respiratory: unlabored breathing on supplemental O2, symmetric chest rise  MSK:   -Left lower extremity  Dressing with streak of blood (similar to yesterday) but otherwise c/d/i, distal incisions appear well healed Foot warm and well perfused   Patient name: Anna Weiss Patient MRN: [id number] Date: 12/20/22

## 2022-12-20 NOTE — Progress Notes (Signed)
Daily Progress Note   Patient Name: Anna Weiss       Date: 12/20/2022 DOB: Nov 14, 1925  Age: 89 y.o. MRN#: [id number] Attending Physician: Callie Fielding, MD Primary Care Physician: Mayra Neer, MD Admit Date: 12/12/2022  Reason for Consultation/Follow-up: Establishing goals of care  Patient Profile/HPI:  89 y.o. female  with past medical history of dementia, remote stroke, HTN, hip fracture in November 2023 with recurrent infections, lung mass (family declined workup),  admitted on 12/12/2022 with recurrent hip infection, admitted for I&D. Post surgery is complicated by lack of ability to ambulate, she is not eating or drinking, she is short of breat (possibly related to malignant pleural effusion). Palliative consulted for above "poa considering hospice/palliative care".     Subjective: Chart reviewed including labs, progress notes, imaging from this and previous encounters.  Medication use reviewed.  Zykeria appears uncomfortable. She is moaning. Awaiting hospice house bed.    Review of Systems  Unable to perform ROS: Mental status change     Physical Exam Vitals and nursing note reviewed.  Constitutional:      Appearance: She is ill-appearing.  Cardiovascular:     Rate and Rhythm: Normal rate.  Pulmonary:     Effort: Pulmonary effort is normal.  Neurological:     Mental Status: She is disoriented.             Vital Signs: BP (!) 90/50   Pulse 71   Temp (!) 97.5 F (36.4 C) (Axillary)   Resp 15   Ht '5\' 7"'$  (1.702 m)   Wt 59 kg   SpO2 100%   BMI 20.36 kg/m  SpO2: SpO2: 100 % O2 Device: O2 Device: Nasal Cannula O2 Flow Rate: O2 Flow Rate (L/min): 3 L/min  Intake/output summary:  Intake/Output Summary (Last 24 hours) at 12/20/2022 1353 Last data filed at 12/19/2022  1800 Gross per 24 hour  Intake 0 ml  Output 550 ml  Net -550 ml    LBM: Last BM Date : 12/14/22 Baseline Weight: Weight: 59 kg Most recent weight: Weight: 59 kg       Palliative Assessment/Data: PPS: 10%      Patient Active Problem List   Diagnosis Date Noted   Medication management 12/15/2022   Deep postoperative wound infection 12/15/2022   MRSA infection 12/15/2022  Hypotension 12/14/2022   Acute urinary retention 12/14/2022   Chronic respiratory failure with hypoxia ([place]) 12/14/2022   Nodule of right lung 12/14/2022   Pleural effusion, left 12/14/2022   Post-operative infection 12/12/2022   Surgical site infection 10/28/2022   Drainage from wound 10/10/2022   Fracture, intertrochanteric, left femur, sequela 09/28/2022   Sinus bradycardia 09/25/2022   Left lower lobe consolidation ([place]) 09/25/2022   Essential hypertension 09/25/2022   Left parietal scalp hematoma, initial encounter 09/25/2022   Displaced intertrochanteric fracture of left femur, initial encounter for closed fracture ([place]) 09/24/2022   Hip fracture ([place]) 09/24/2022   Rib fracture 04/08/2022   Lumbar compression fracture ([place]) 04/08/2022   Pain in right lower leg 05/14/2018    Palliative Care Assessment & Plan    Assessment/Recommendations/Plan  Requested RN to administer prn morphine Increase morphine '2mg'$ IV to q4 hours and continue prn dosing   Code Status: DNR  Prognosis:  < 2 weeks  Discharge Planning: To Be Determined  Care plan was discussed with patient's daughter and care team.   Thank you for allowing the Palliative Medicine Team to assist in the care of this patient.   Greater than 50%  of this time was spent counseling and coordinating care related to the above assessment and plan.  Mariana Kaufman, AGNP-C Palliative Medicine   Please contact Palliative Medicine Team phone at [phone] for questions and concerns.

## 2022-12-21 DIAGNOSIS — Z515 Encounter for palliative care: Secondary | ICD-10-CM | POA: Diagnosis not present

## 2022-12-21 DIAGNOSIS — Z7189 Other specified counseling: Secondary | ICD-10-CM | POA: Diagnosis not present

## 2022-12-21 LAB — AEROBIC/ANAEROBIC CULTURE W GRAM STAIN (SURGICAL/DEEP WOUND)

## 2022-12-21 MED ORDER — ONDANSETRON 4 MG PO TBDP: 4.0000 mg | ORAL_TABLET | Freq: Four times a day (QID) | ORAL | 0 refills | Status: AC | PRN

## 2022-12-21 MED ORDER — LORAZEPAM 2 MG/ML IJ SOLN: 1.0000 mg | INTRAMUSCULAR | 0 refills | Status: AC

## 2022-12-21 MED ORDER — MORPHINE SULFATE (PF) 2 MG/ML IV SOLN: 2.0000 mg | INTRAVENOUS | 0 refills | Status: AC | PRN

## 2022-12-21 MED ORDER — GLYCOPYRROLATE 0.2 MG/ML IJ SOLN: 0.2000 mg | INTRAMUSCULAR | 0 refills | Status: AC | PRN

## 2022-12-21 MED ORDER — HALOPERIDOL 0.5 MG PO TABS: 0.5000 mg | ORAL_TABLET | ORAL | 0 refills | Status: AC | PRN

## 2022-12-21 MED ORDER — LORAZEPAM 2 MG/ML IJ SOLN
1.0000 mg | INTRAMUSCULAR | Status: DC
  Administered 2022-12-21: 1 mg via INTRAVENOUS
  Filled 2022-12-21: qty 1

## 2022-12-21 MED ORDER — ACETAMINOPHEN 650 MG RE SUPP: 650.0000 mg | Freq: Four times a day (QID) | RECTAL | 0 refills | Status: AC | PRN

## 2022-12-21 MED ORDER — ACETAMINOPHEN 325 MG PO TABS: 650.0000 mg | ORAL_TABLET | Freq: Four times a day (QID) | ORAL | 0 refills | Status: AC | PRN

## 2022-12-21 MED ORDER — HALOPERIDOL LACTATE 2 MG/ML PO CONC: 0.5000 mg | ORAL | 0 refills | Status: AC | PRN

## 2022-12-21 MED ORDER — HALOPERIDOL LACTATE 5 MG/ML IJ SOLN: 0.5000 mg | INTRAMUSCULAR | 0 refills | Status: AC | PRN

## 2022-12-21 MED ORDER — GLYCOPYRROLATE 1 MG PO TABS: 1.0000 mg | ORAL_TABLET | ORAL | 0 refills | Status: AC | PRN

## 2022-12-21 MED ORDER — MORPHINE SULFATE (PF) 2 MG/ML IV SOLN: 2.0000 mg | INTRAVENOUS | 0 refills | Status: AC

## 2022-12-21 MED ORDER — ONDANSETRON HCL 4 MG/2ML IJ SOLN: 4.0000 mg | Freq: Four times a day (QID) | INTRAMUSCULAR | 0 refills | Status: AC | PRN

## 2022-12-21 NOTE — Discharge Summary (Addendum)
Orthopedic Surgery Discharge Summary  Patient name: Anna Weiss Patient MRN: [id number] Admit date: 12/12/2022 Discharge date: 12/21/2022  Attending physician: Ileene Rubens, MD Final diagnosis: left hip postoperative infection Findings: purulent material around the left proximal hip incision  Hospital course: Patient is a 89 y.o. female who was sustained a left intertochanteric femur fracture in 09/2022. She underwent internal fixation in 112023. The wound subsequently got infected with MRSA. She received 4 weeks of daptomycin. Once daptomycin was stopped, she returned to the office on 12/12/2022 with purulent drainage. Incision and drainage was discussed. She was brought to the OR that evening for debridement. She was admitted to the floor post-operatively. She underwent a transfusion for post-operative anemia. She was post-operative supplemental O2 which was a continuation of pre-op. She also had a pre-operative foley. It was removed post-operatively to attempt a void trial but she was unable to void so it was replaced and left in place. She eventually became delirious and was having pain around POD#3. [name] (daughter and POA) requested to talk to palliative care to discuss hospice as an option. Decision to proceed with comfort care and seek hospice was decided after that consultation. Patient remained admitted while waiting for a bed at a hospice facility. While waiting her pain was controlled with morphine. The patient was ready for discharge and facility was arranged. She was discharge to hospice on post-operative day 9.  Allergies as of 12/21/2022       Reactions   Asa [aspirin] Nausea Only        Medication List     STOP taking these medications    antiseptic oral rinse Liqd   ARTIFICIAL TEAR OP   bisacodyl 10 MG suppository Commonly known as: DULCOLAX   cephALEXin 250 MG capsule Commonly known as: KEFLEX   clopidogrel 75 MG tablet Commonly known as: PLAVIX   Ensure  Original Liqd   famotidine 40 MG tablet Commonly known as: PEPCID   famotidine 40 MG/5ML suspension Commonly known as: PEPCID   ferrous sulfate 325 (65 FE) MG tablet   guaifenesin 100 MG/5ML syrup Commonly known as: ROBITUSSIN   guaiFENesin 600 MG 12 hr tablet Commonly known as: MUCINEX   hydrALAZINE 25 MG tablet Commonly known as: APRESOLINE   iron polysaccharides 150 MG capsule Commonly known as: NIFEREX   levothyroxine 50 MCG tablet Commonly known as: SYNTHROID   magnesium hydroxide 400 MG/5ML suspension Commonly known as: MILK OF MAGNESIA   morphine CONCENTRATE 10 MG/0.5ML Soln concentrated solution Replaced by: morphine (PF) 2 MG/ML injection   multivitamin with minerals Tabs tablet   NON FORMULARY   NON FORMULARY   ondansetron 4 MG tablet Commonly known as: ZOFRAN Replaced by: ondansetron 4 MG/2ML Soln injection   potassium chloride 20 MEQ packet Commonly known as: KLOR-CON   PROSTAT PO   RA SALINE ENEMA RE   tamsulosin 0.4 MG Caps capsule Commonly known as: FLOMAX       TAKE these medications    acetaminophen 325 MG tablet Commonly known as: TYLENOL Take 2 tablets (650 mg total) by mouth every 6 (six) hours as needed for mild pain (or Fever >/= 101). What changed: reasons to take this   acetaminophen 650 MG suppository Commonly known as: TYLENOL Place 1 suppository (650 mg total) rectally every 6 (six) hours as needed for mild pain (or Fever >/= 101). What changed: You were already taking a medication with the same name, and this prescription was added. Make sure you understand how and when to  take each.   glycopyrrolate 1 MG tablet Commonly known as: ROBINUL Take 1 tablet (1 mg total) by mouth every 4 (four) hours as needed (excessive secretions).   glycopyrrolate 0.2 MG/ML injection Commonly known as: ROBINUL Inject 1 mL (0.2 mg total) into the skin every 4 (four) hours as needed (excessive secretions).   glycopyrrolate 0.2 MG/ML  injection Commonly known as: ROBINUL Inject 1 mL (0.2 mg total) into the vein every 4 (four) hours as needed (excessive secretions).   haloperidol 0.5 MG tablet Commonly known as: HALDOL Take 1 tablet (0.5 mg total) by mouth every 4 (four) hours as needed for agitation (or delirium).   haloperidol 2 MG/ML solution Commonly known as: HALDOL Place 0.3 mLs (0.6 mg total) under the tongue every 4 (four) hours as needed for agitation (or delirium).   haloperidol lactate 5 MG/ML injection Commonly known as: HALDOL Inject 0.1 mLs (0.5 mg total) into the vein every 4 (four) hours as needed (or delirium).   LORazepam 2 MG/ML injection Commonly known as: ATIVAN Inject 0.5 mLs (1 mg total) into the vein every 4 (four) hours.   morphine (PF) 2 MG/ML injection Inject 1 mL (2 mg total) into the vein every 15 (fifteen) minutes as needed (pain, or dyspnea, RR>26). Replaces: morphine CONCENTRATE 10 MG/0.5ML Soln concentrated solution   morphine (PF) 2 MG/ML injection Inject 1 mL (2 mg total) into the vein every 4 (four) hours.   ondansetron 4 MG disintegrating tablet Commonly known as: ZOFRAN-ODT Take 1 tablet (4 mg total) by mouth every 6 (six) hours as needed for nausea.   ondansetron 4 MG/2ML Soln injection Commonly known as: ZOFRAN Inject 2 mLs (4 mg total) into the vein every 6 (six) hours as needed for nausea. Replaces: ondansetron 4 MG tablet         Instructions:   Orthopedic Surgery Discharge Instructions  Patient name: [name] Fracture: Left intertrochanteric femur fracture Procedure Performed: Left hip irrigation and debridement Date of Surgery: 12/12/2022 Surgeon: Ileene Rubens, MD  Activity: You are allowed to put as much weight on your leg as you would like. You can walk as much as you would like. The implant is designed to allow for full body weight on it so you do not have to worry about injuring the hip by walking or transferring on it.   Incision Care: Your  incision site has a dressing over it. That dressing should remain in place and dry at all times for a total of two weeks after surgery. After two weeks, the dressing can be removed. It is okay to leave the wound open to air at that time. There are sutures underneath the dressing that can be removed on 01/02/2023. These sutures can be removed at hospice care.  Medications: '2mg'$  morphine to be given every 6 hours scheduled. '2mg'$  morphine every 15 minutes for pain or respiratory rate greater than 26. Haldol 0.'5mg'$  every 4 hours as needed for agitation or delirium. Glycopyrrolate 0.'2mg'$  every 4 hours as needed for excessive secretions. Tylenol '650mg'$  every 6 hours for pain. Zofran '4mg'$  as needed every 8 hours for nausea or emesis.   Foley care: Patient has been unable to void independently, so foley was placed. She has failed previous void trials so foley will remain in place. Usually I recommend foley be removed and replaced every 5 days, but you can follow your facility's own guidelines. Foley was last placed on 12/16/2022.  Diet: no restrictions   Wound care: Patient has bilateral  heel ulcers and a sacral ulcer. Please pad these ulcers and offload the heels with offloader boots or pillows. Apply santyl daily to the sacral ulcer.   Reasons to Call the Office After Surgery: You should feel free to call the office with any concerns or questions you have in the post-operative period.  Follow Up Appointments: Since the patient is going for hospice care, she does not need any follow up appointments. If there are concerns or you would like the patient to be seen, please call the office and I will get her an appointment for whenever she can be transported.    Office Information:  -Ileene Rubens, MD -Phone number: [phone] -Address: [address]       [place], [place] [address]

## 2022-12-21 NOTE — Progress Notes (Signed)
Orthopedic Surgery Progress Note   Assessment: Patient is a 89 y.o. female that underwent cephalomedullary rodding for a intertrochanteric femur fracture. Subsequently became infected and presented to the office with drainage after completing antibiotics. Now s/p repeat I&D   Plan: -Operative plans: complete -Diet: regular -Palliative medicine following, appreciate -Patient is comfort care -Weight bearing status: as tolerated -PT evaluate and treat -Pain control -Dispo: hospice when bed available  HTN -continuing to hold home hydralazine  Urinary retention -foley replaced (1/23) since she failed void trial  Hypothyroidism -holding home levothyroxine as she is comfort care  Chronic respiratory failure with hypoxia  -has lesions in the lungs, possible metastatic lesion -daughter, POA, does not want the CT findings discussed with the patient or further work up  Pressure ulcer over bilateral heels and right ischial tuberosity, present on admission -wound care consulted -nursing to do santyl and padding to the ischial tuberosity -nursing to keep her in boots and offload the heels  Chronic anemia with acute blood loss from surgery -hgb was 7.7 and got a transfusion, hgb is now 9.1 -will stop monitoring  ___________________________________________________________________________  Subjective: No acute events overnight. Remains on the floor. Sleeping again this morning, appears comfortable.    Physical Exam:  General: no acute distress, thin body habitus, resting comfortably Neurologic: sleeping (did not disturb her) Respiratory: unlabored breathing on room air, symmetric chest rise  MSK:   -Left lower extremity  Dressing with streak of blood (unchanged) otherwise c/d/i, distal incisions appear well healed Foot warm and well perfused   Patient name: Anna Weiss Patient MRN: [id number] Date: 12/21/22

## 2022-12-21 NOTE — Progress Notes (Signed)
   Palliative Medicine Inpatient Follow Up Note   HPI:  89 y.o. female  with past medical history of dementia, remote stroke, HTN, hip fracture in November 2023 with recurrent infections, lung mass (family declined workup),  admitted on 12/12/2022 with recurrent hip infection, admitted for I&D. Post surgery is complicated by lack of ability to ambulate, she is not eating or drinking, she is short of breat (possibly related to malignant pleural effusion). Palliative consulted for above "poa considering hospice/palliative care".   Today's Discussion 12/21/2022  *Please note that this is a verbal dictation therefore any spelling or grammatical errors are due to the "[place] [employer]" system interpretation.  Chart reviewed inclusive of vital signs, progress notes, laboratory results, and diagnostic images.   I met with patients RN, Tameshia at bedside. She shares that patient was more vocal this morning therefore given morphine. She shares that the patient seems to respond well with ativan therefore we discussed adding this as an adjuvant.   On assessment, Raihana was not coherent and moaning loudly. She was cool in all four extremities. She does appear to be in the active phases of the dying process.   No family was present at bedside this morning.   Questions and concerns addressed/Palliative Support Provided.   Objective Assessment: Vital Signs Vitals:   12/20/22 2344 12/21/22 0801  BP: 127/69 118/64  Pulse: 93 67  Resp: 14 13  Temp: 97.7 F (36.5 C) (!) 97.5 F (36.4 C)  SpO2: 100% 96%   No intake or output data in the 24 hours ending 12/21/22 1121 Last Weight  Most recent update: 12/12/2022 11:55 AM    Weight  59 kg (130 lb)            Gen:  Frail elderly F in moderate distress HEENT: Dry mucous membranes CV: Regular rate and rhythm PULM: On 2LPM [place], breathing even and nonlabored ABD: soft/nontender  EXT: Cool distal digits Neuro:  incoherent  SUMMARY OF  RECOMMENDATIONS   DNAR/DNI  Comfort Measures  Added ATC ativan with morphine  Plan for transition to HOP today  Ongoing PMT support  Billing based on MDM: High  Problems Addressed: One acute or chronic illness or injury that poses a threat to life or bodily function  Amount and/or Complexity of Data: Category 3:Discussion of management or test interpretation with external physician/other qualified health care professional/appropriate source (not separately reported)  Risks: Parenteral controlled substances, Decision regarding hospitalization or escalation of hospital care, and Decision not to resuscitate or to de-escalate care because of poor prognosis ______________________________________________________________________________________ [facility] Team Team Cell Phone: [phone] Please utilize secure chat with additional questions, if there is no response within 30 minutes please call the above phone number  Palliative Medicine Team providers are available by phone from 7am to 7pm daily and can be reached through the team cell phone.  Should this patient require assistance outside of these hours, please call the patient's attending physician.

## 2022-12-21 NOTE — Plan of Care (Signed)
  Problem: Clinical Measurements: Goal: Cardiovascular complication will be avoided Outcome: Adequate for Discharge   Problem: Activity: Goal: Risk for activity intolerance will decrease Outcome: Adequate for Discharge   Problem: Nutrition: Goal: Adequate nutrition will be maintained Outcome: Adequate for Discharge   Problem: Coping: Goal: Level of anxiety will decrease Outcome: Adequate for Discharge   Problem: Elimination: Goal: Will not experience complications related to bowel motility Outcome: Adequate for Discharge Goal: Will not experience complications related to urinary retention Outcome: Adequate for Discharge   Problem: Pain Managment: Goal: General experience of comfort will improve Outcome: Adequate for Discharge   Problem: Safety: Goal: Ability to remain free from injury will improve Outcome: Adequate for Discharge   Problem: Skin Integrity: Goal: Risk for impaired skin integrity will decrease Outcome: Adequate for Discharge   Problem: Education: Goal: Knowledge of the prescribed therapeutic regimen will improve Outcome: Adequate for Discharge   Problem: Coping: Goal: Ability to identify and develop effective coping behavior will improve Outcome: Adequate for Discharge   Problem: Clinical Measurements: Goal: Quality of life will improve Outcome: Adequate for Discharge   Problem: Respiratory: Goal: Verbalizations of increased ease of respirations will increase Outcome: Adequate for Discharge   Problem: Role Relationship: Goal: Family's ability to cope with current situation will improve Outcome: Adequate for Discharge Goal: Ability to verbalize concerns, feelings, and thoughts to partner or family member will improve Outcome: Adequate for Discharge   Problem: Pain Management: Goal: Satisfaction with pain management regimen will improve Outcome: Adequate for Discharge

## 2022-12-21 NOTE — TOC Transition Note (Addendum)
Transition of Care [facility]) - CM/SW Discharge Note   Patient Details  Name: KAYBREE WILLIAMS MRN: [id number] Date of Birth: 04/25/1925  [facility]) CM/SW Contact:  Elliot Gurney [place], [place] Phone Number: 12/21/2022, 11:29 AM   Clinical Narrative:    Follow up phone call to [place]. Spoke with Tharon Aquas who confirms that they have a bed for patient after 2pm today.Report to be called in to [phone]. CSW to arrange transport. Per Tharon Aquas, patient's daughter has been informed and is agreeable to patient's transition to the Hospice Home today.  2:13pm [facility] contacted to provide transport to [place].  Ryley Bachtel, LCSW Transition of Care      Barriers to Discharge: Hospice Bed not available   Patient Goals and CMS Choice   Choice offered to / list presented to : Patient, Adult Children (daughter [name])  Discharge Placement                         Discharge Plan and Services Additional resources added to the After Visit Summary for   In-house Referral: Clinical Social Work   Post Acute Care Choice: [place]                               Social Determinants of Health ([place]) Interventions [place]: No Food Insecurity (09/25/2022)  Housing: Low Risk  (09/25/2022)  Transportation Needs: No Transportation Needs (09/25/2022)  Utilities: Not At Risk (09/25/2022)  Tobacco Use: Low Risk  (12/13/2022)     Readmission Risk Interventions     No data to display

## 2022-12-25 DEATH — deceased

## 2023-01-02 ENCOUNTER — Inpatient Hospital Stay: Payer: PRIVATE HEALTH INSURANCE | Admitting: Infectious Diseases

## 2023-01-05 ENCOUNTER — Ambulatory Visit: Payer: Medicare Other | Admitting: Orthopedic Surgery

## 2023-01-05 IMAGING — CT CT L SPINE W/O CM
3 series · 10 of 33 positions shown, 12 images · non-contrast
Comparison: None.

CLINICAL DATA: Fall



[Series 4: l-spine wo soft tissue · axial · 0.41mm/px · z∈[+842,+962]mm · 2 of 132 slices shown, 3 images]
[im 41/132  soft-tissue]
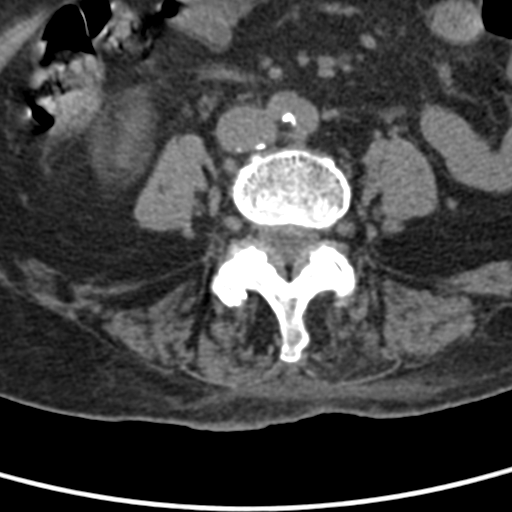
[im 41/132  bone]
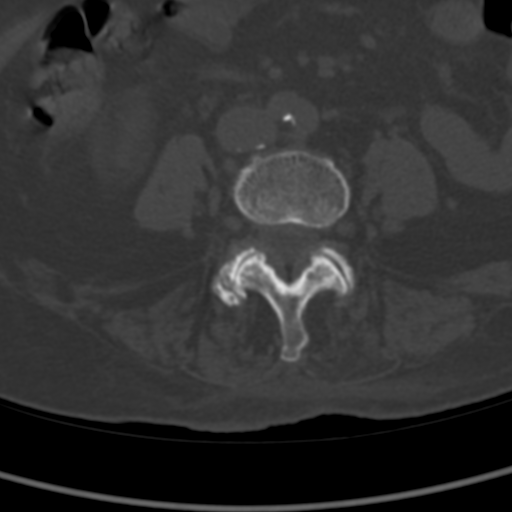
[im 101/132  bone]
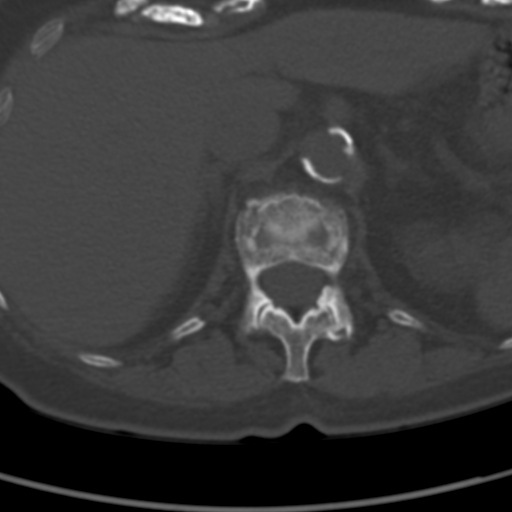

[Series 5: coronal bone · coronal · 0.41mm/px · 3 of 67 slices shown]
[im 14/67  bone]
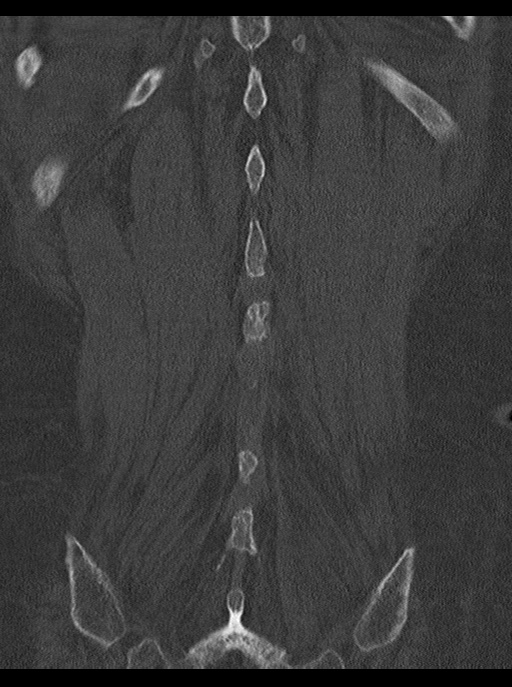
[im 27/67  bone]
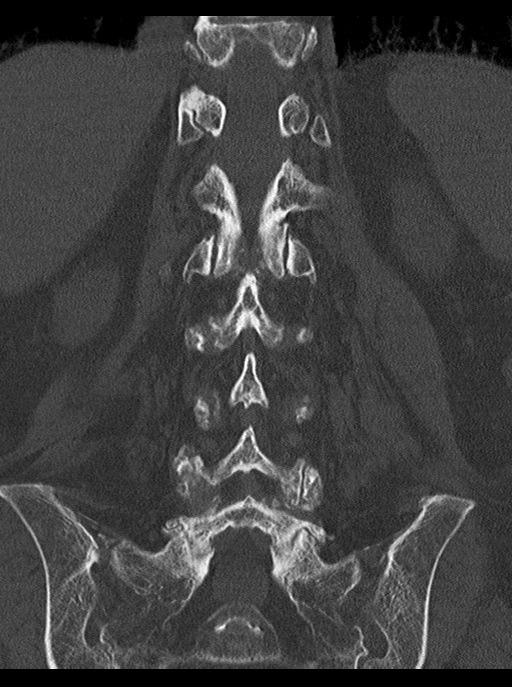
[im 40/67  bone]
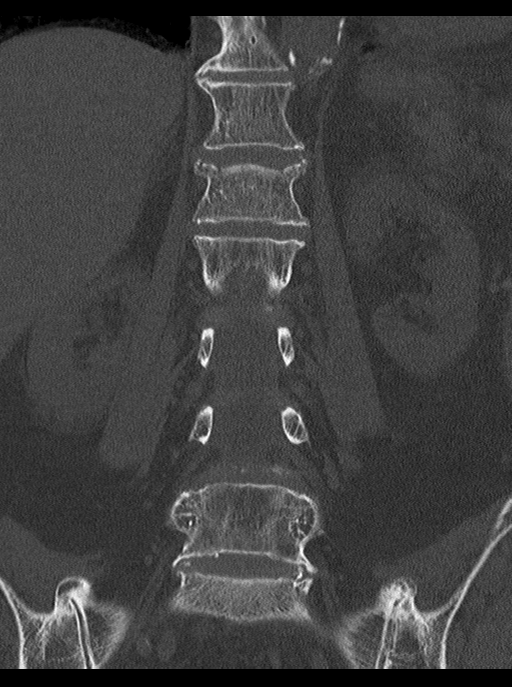

[Series 6: sagittal bone · sagittal · 0.26mm/px · 5 of 105 slices shown, 6 images]
[im 35/105  bone]
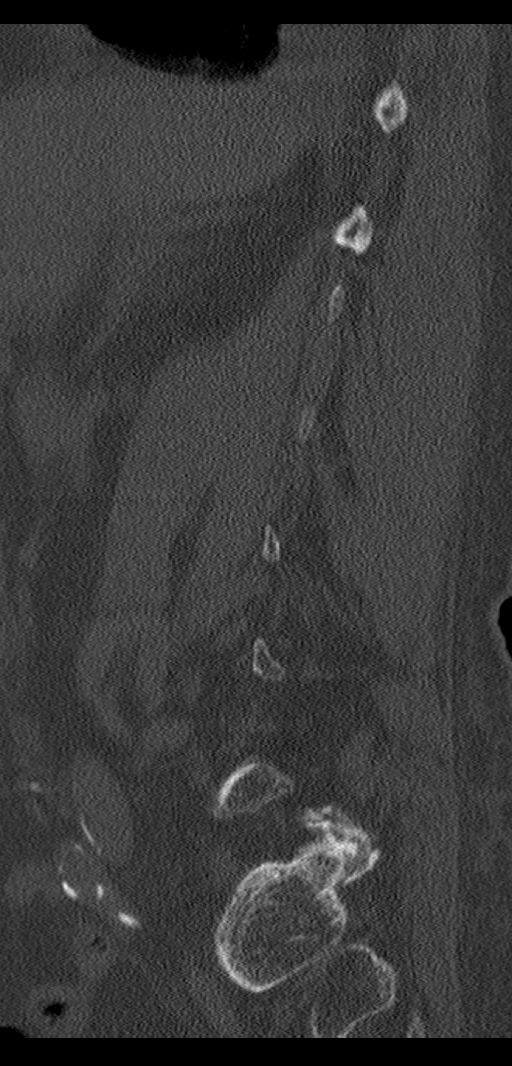
[im 44/105  bone]
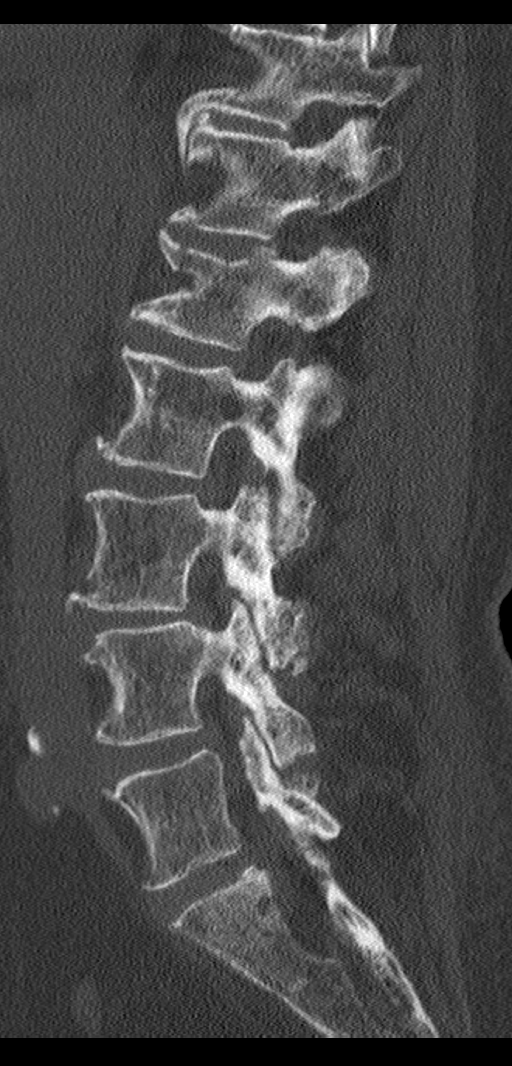
[im 53/105  soft-tissue]
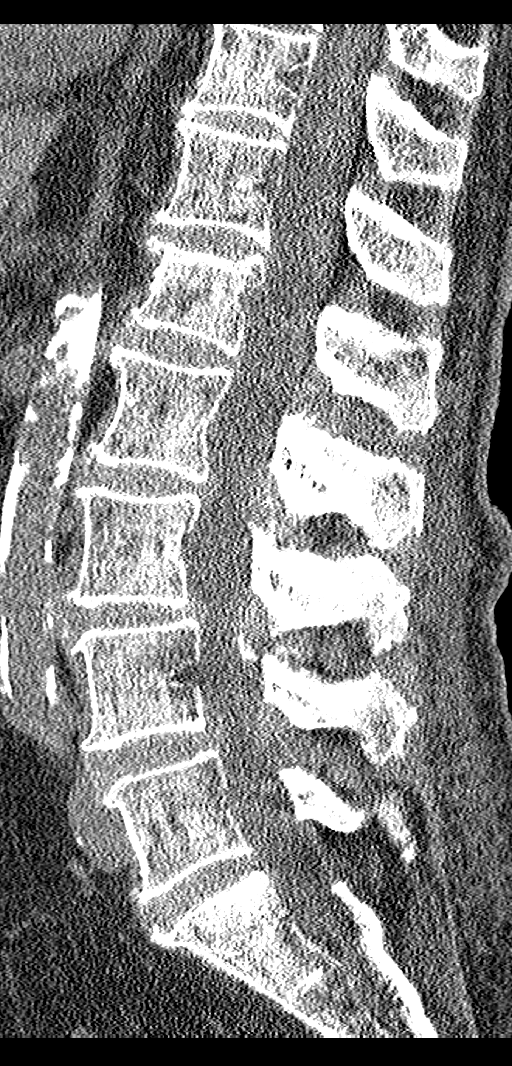
[im 53/105  bone]
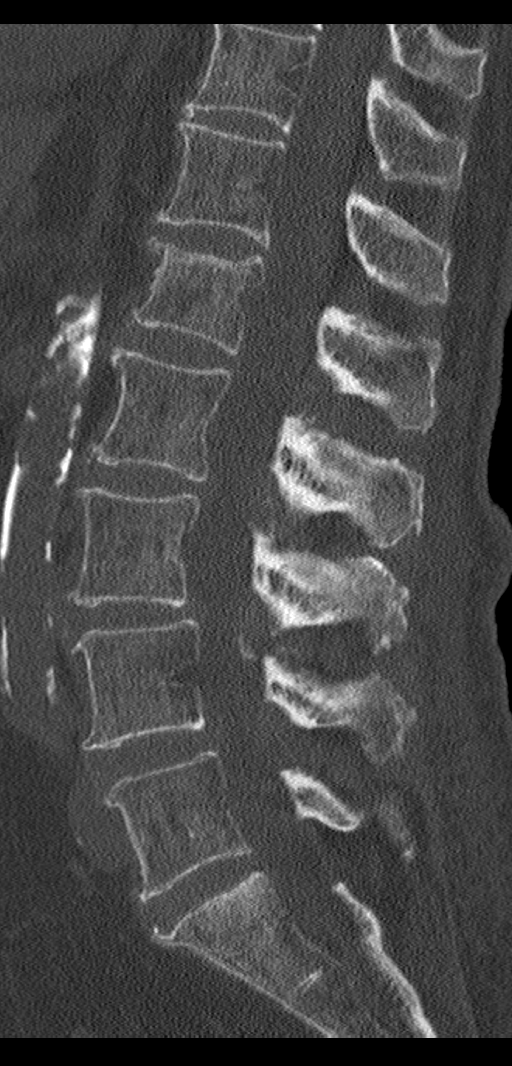
[im 61/105  bone]
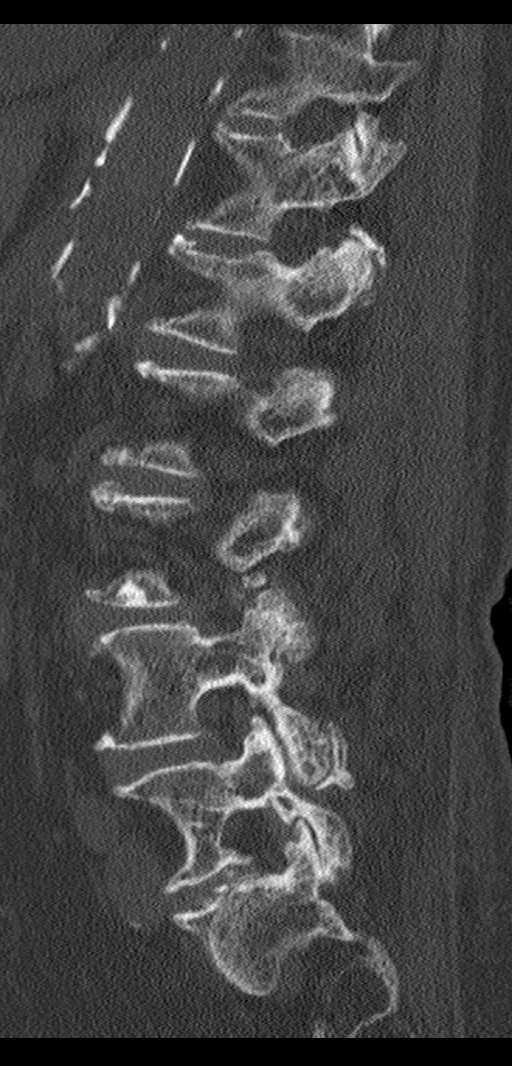
[im 70/105  bone]
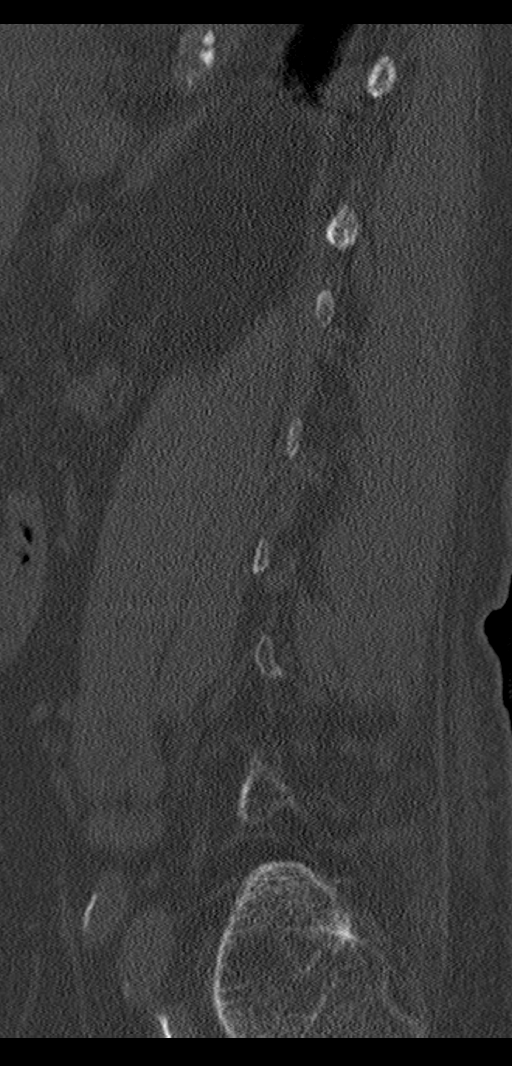

[10 of 33 positions shown; findings below may reference images not displayed]

FINDINGS: Segmentation: 5 lumbar type vertebrae.

Alignment: Grade 1 anterolisthesis at L3-4

Vertebrae: There is a mild wedge compression fracture of L1 with
less than 25% height loss. This is likely acute or subacute.

Paraspinal and other soft tissues: Small right pleural effusion.
Calcific aortic atherosclerosis.

Disc levels: At L3-4, there is moderate spinal canal stenosis due to
combination of facet hypertrophy, disc bulge and ligamentum flavum
redundancy. There is mild multifactorial stenosis of the spinal
canal at L4-5. No neural impingement.
IMPRESSION: 1. Mild L1 wedge compression fracture with less than 25% height
loss, likely acute or subacute.
2. Moderate L3-4 and mild L4-5 spinal canal stenosis.
3. Small right pleural effusion.

Aortic Atherosclerosis ([id number].D).

## 2023-03-04 ENCOUNTER — Ambulatory Visit: Payer: PRIVATE HEALTH INSURANCE | Admitting: Internal Medicine
